# Patient Record
Sex: Male | Born: 1985
Health system: Southern US, Community
[De-identification: ages and names within clinical notes are randomized; demographics above are authoritative.]

## PROBLEM LIST (undated history)

## (undated) DIAGNOSIS — J36 Peritonsillar abscess: Secondary | ICD-10-CM

## (undated) DIAGNOSIS — E119 Type 2 diabetes mellitus without complications: Secondary | ICD-10-CM

## (undated) DIAGNOSIS — N182 Chronic kidney disease, stage 2 (mild): Secondary | ICD-10-CM

## (undated) DIAGNOSIS — E785 Hyperlipidemia, unspecified: Secondary | ICD-10-CM

## (undated) DIAGNOSIS — E669 Obesity, unspecified: Secondary | ICD-10-CM

## (undated) DIAGNOSIS — I10 Essential (primary) hypertension: Secondary | ICD-10-CM

## (undated) DIAGNOSIS — F419 Anxiety disorder, unspecified: Secondary | ICD-10-CM

## (undated) HISTORY — DX: Hyperlipidemia, unspecified: E78.5

## (undated) HISTORY — DX: Chronic kidney disease, stage 2 (mild): N18.2

## (undated) HISTORY — PX: CHOLECYSTECTOMY: SHX55

## (undated) HISTORY — PX: INCISION AND DRAINAGE OF PERITONSILLAR ABCESS: SHX6257

---
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
## 2011-11-18 ENCOUNTER — Emergency Department (HOSPITAL_COMMUNITY)
Admission: EM | Admit: 2011-11-18 | Discharge: 2011-11-18 | Disposition: A | Discharge status: Home / Self Care | Payer: Self-pay | Attending: Emergency Medicine | Admitting: Emergency Medicine

## 2011-11-18 ENCOUNTER — Encounter (HOSPITAL_COMMUNITY): Payer: Self-pay | Admitting: Emergency Medicine

## 2011-11-18 DIAGNOSIS — K029 Dental caries, unspecified: Secondary | ICD-10-CM | POA: Insufficient documentation

## 2011-11-18 DIAGNOSIS — K047 Periapical abscess without sinus: Secondary | ICD-10-CM | POA: Insufficient documentation

## 2011-11-18 MED ORDER — PENICILLIN V POTASSIUM 500 MG PO TABS: 500.0000 mg | ORAL_TABLET | Freq: Three times a day (TID) | ORAL | Status: AC

## 2011-11-18 MED ORDER — TRAMADOL HCL 50 MG PO TABS
50.0000 mg | ORAL_TABLET | Freq: Once | ORAL | Status: AC
  Administered 2011-11-18: 50 mg via ORAL
  Filled 2011-11-18: qty 1

## 2011-11-18 MED ORDER — PENICILLIN V POTASSIUM 500 MG PO TABS
500.0000 mg | ORAL_TABLET | Freq: Four times a day (QID) | ORAL | Status: DC
  Administered 2011-11-18: 500 mg via ORAL
  Filled 2011-11-18: qty 1

## 2011-11-18 MED ORDER — TRAMADOL HCL 50 MG PO TABS: 50.0000 mg | ORAL_TABLET | Freq: Once | ORAL | Status: AC

## 2011-11-18 NOTE — ED Provider Notes (Signed)
History     CSN: [id number]  Arrival date & time 11/18/11  0129   None     Chief Complaint  Patient presents with  . Dental Pain    (Consider location/radiation/quality/duration/timing/severity/associated sxs/prior treatment) HPI Comments: Patient with upper and lower R tooth pain intermittently for he past 3 days worse tonight  Patient is a 26 y.o. male presenting with tooth pain. The history is provided by the patient.  Dental PainThe primary symptoms include mouth pain. Primary symptoms do not include headaches or fever. The symptoms began 3 to 5 days ago. The symptoms are worsening. The symptoms occur constantly.  Additional symptoms do not include: ear pain.    History reviewed. No pertinent past medical history.  History reviewed. No pertinent past surgical history.  History reviewed. No pertinent family history.  History  Substance Use Topics  . Smoking status: Never Smoker   . Smokeless tobacco: Not on file   Comment: quit in January  . Alcohol Use: No      Review of Systems  Constitutional: Negative for fever.  HENT: Negative for ear pain.   Neurological: Negative for dizziness and headaches.    Allergies  Review of patient's allergies indicates no known allergies.  Home Medications   Current Outpatient Rx  Name Route Sig Dispense Refill  . PENICILLIN V POTASSIUM 500 MG PO TABS Oral Take 1 tablet (500 mg total) by mouth 3 (three) times daily. 30 tablet 0  . TRAMADOL HCL 50 MG PO TABS Oral Take 1 tablet (50 mg total) by mouth once. 30 tablet 0    BP 164/106  Pulse 76  Temp 98.7 F (37.1 C) (Oral)  Resp 18  SpO2 100%  Physical Exam  Constitutional: He appears well-developed and well-nourished.  HENT:  Head: Normocephalic.  Mouth/Throat:    Eyes: Pupils are equal, round, and reactive to light.  Neck: Normal range of motion.  Cardiovascular: Normal rate.   Pulmonary/Chest: Effort normal.  Musculoskeletal: Normal range of motion.    Lymphadenopathy:    He has no cervical adenopathy.  Neurological: He is alert.  Skin: Skin is warm.    ED Course  Procedures (including critical care time)  Labs Reviewed - No data to display No results found.   1. Dental cavity   2. Dental abscess       MDM   Large cavity with dental abscess        Arman Filter, NP 11/18/11 0303  Arman Filter, NP 11/18/11 4098  Arman Filter, NP 11/18/11 1191  Arman Filter, NP 11/18/11 [phone]

## 2011-11-18 NOTE — ED Notes (Signed)
Patient states that he has had pain to his right side of his mouth upper and lower for 3 days.

## 2011-11-20 NOTE — ED Provider Notes (Signed)
Medical screening examination/treatment/procedure(s) were performed by non-physician practitioner and as supervising physician I was immediately available for consultation/collaboration.    Jathan Balling D Marketta Valadez, MD 11/20/11 2251 

## 2012-12-29 ENCOUNTER — Encounter (HOSPITAL_COMMUNITY): Payer: Self-pay | Admitting: Emergency Medicine

## 2012-12-29 ENCOUNTER — Emergency Department (HOSPITAL_COMMUNITY): Payer: Self-pay

## 2012-12-29 ENCOUNTER — Emergency Department (HOSPITAL_COMMUNITY)
Admission: EM | Admit: 2012-12-29 | Discharge: 2012-12-29 | Disposition: A | Discharge status: Home / Self Care | Payer: Self-pay | Attending: Emergency Medicine | Admitting: Emergency Medicine

## 2012-12-29 DIAGNOSIS — H9209 Otalgia, unspecified ear: Secondary | ICD-10-CM | POA: Insufficient documentation

## 2012-12-29 DIAGNOSIS — R498 Other voice and resonance disorders: Secondary | ICD-10-CM | POA: Insufficient documentation

## 2012-12-29 DIAGNOSIS — R51 Headache: Secondary | ICD-10-CM | POA: Insufficient documentation

## 2012-12-29 DIAGNOSIS — J36 Peritonsillar abscess: Secondary | ICD-10-CM | POA: Insufficient documentation

## 2012-12-29 DIAGNOSIS — R1319 Other dysphagia: Secondary | ICD-10-CM | POA: Insufficient documentation

## 2012-12-29 DIAGNOSIS — J3489 Other specified disorders of nose and nasal sinuses: Secondary | ICD-10-CM | POA: Insufficient documentation

## 2012-12-29 DIAGNOSIS — R42 Dizziness and giddiness: Secondary | ICD-10-CM | POA: Insufficient documentation

## 2012-12-29 LAB — CBC WITH DIFFERENTIAL/PLATELET
Basophils Absolute: 0 10*3/uL (ref 0.0–0.1)
Eosinophils Relative: 0 % (ref 0–5)
Lymphocytes Relative: 26 % (ref 12–46)
Lymphs Abs: 3.7 10*3/uL (ref 0.7–4.0)
MCV: 70.7 fL — ABNORMAL LOW (ref 78.0–100.0)
Monocytes Relative: 10 % (ref 3–12)
Neutrophils Relative %: 64 % (ref 43–77)
Platelets: 287 10*3/uL (ref 150–400)
RBC: 5.87 MIL/uL — ABNORMAL HIGH (ref 4.22–5.81)
RDW: 14.2 % (ref 11.5–15.5)
WBC: 14.1 10*3/uL — ABNORMAL HIGH (ref 4.0–10.5)

## 2012-12-29 LAB — MONONUCLEOSIS SCREEN: Mono Screen: NEGATIVE

## 2012-12-29 LAB — BASIC METABOLIC PANEL
Chloride: 100 mEq/L (ref 96–112)
Creatinine, Ser: 1.33 mg/dL (ref 0.50–1.35)
GFR calc Af Amer: 82 mL/min — ABNORMAL LOW (ref >90)
Sodium: 138 mEq/L (ref 135–145)

## 2012-12-29 MED ORDER — SODIUM CHLORIDE 0.9 % IV BOLUS (SEPSIS)
1000.0000 mL | Freq: Once | INTRAVENOUS | Status: AC
  Administered 2012-12-29: 1000 mL via INTRAVENOUS

## 2012-12-29 MED ORDER — CLINDAMYCIN HCL 300 MG PO CAPS: 300.0000 mg | ORAL_CAPSULE | Freq: Four times a day (QID) | ORAL | Status: DC

## 2012-12-29 MED ORDER — ONDANSETRON HCL 4 MG/2ML IJ SOLN
4.0000 mg | Freq: Once | INTRAMUSCULAR | Status: AC
  Administered 2012-12-29: 4 mg via INTRAVENOUS
  Filled 2012-12-29: qty 2

## 2012-12-29 MED ORDER — CLINDAMYCIN PHOSPHATE 600 MG/50ML IV SOLN
600.0000 mg | Freq: Once | INTRAVENOUS | Status: AC
  Administered 2012-12-29: 600 mg via INTRAVENOUS
  Filled 2012-12-29: qty 50

## 2012-12-29 MED ORDER — IOHEXOL 300 MG/ML  SOLN
100.0000 mL | Freq: Once | INTRAMUSCULAR | Status: AC | PRN
  Administered 2012-12-29: 100 mL via INTRAVENOUS

## 2012-12-29 MED ORDER — MORPHINE SULFATE 4 MG/ML IJ SOLN
4.0000 mg | Freq: Once | INTRAMUSCULAR | Status: AC
  Administered 2012-12-29: 4 mg via INTRAVENOUS
  Filled 2012-12-29: qty 1

## 2012-12-29 MED ORDER — DEXAMETHASONE SODIUM PHOSPHATE 10 MG/ML IJ SOLN
10.0000 mg | Freq: Once | INTRAMUSCULAR | Status: AC
  Administered 2012-12-29: 10 mg via INTRAVENOUS
  Filled 2012-12-29: qty 1

## 2012-12-29 NOTE — ED Notes (Signed)
PA at bedside.

## 2012-12-29 NOTE — ED Provider Notes (Signed)
CSN: [id number]     Arrival date & time 12/29/12  1619 History     First MD Initiated Contact with Patient 12/29/12 1638     Chief Complaint  Patient presents with  . Sore Throat  . Dizziness  . Headache   (Consider location/radiation/quality/duration/timing/severity/associated sxs/prior Treatment) HPI Comments: Patient is a 27 year old male who presents today with trismus and "swollen neck". This has been gradually worsening over the past 3 days. He describes the pain as a pressure. He is taking Mucinex to attempt to relieve his symptoms with no relief. He is unsure if he has had a fever because he has not measured it. He denies nausea, vomiting, abdominal pain. He admits to right ear pain. He has a mild amount of congestion.  The history is provided by the patient. No language interpreter was used.    No past medical history on file. No past surgical history on file. No family history on file. History  Substance Use Topics  . Smoking status: Never Smoker   . Smokeless tobacco: Not on file     Comment: quit in January  . Alcohol Use: No    Review of Systems  Constitutional: Negative for fever and chills.  HENT: Positive for ear pain, sore throat, trouble swallowing and voice change.   All other systems reviewed and are negative.    Allergies  Review of patient's allergies indicates no known allergies.  Home Medications  No current outpatient prescriptions on file. BP 146/96  Pulse 106  Temp(Src) 99.7 F (37.6 C) (Oral)  Resp 20  SpO2 97% Physical Exam  Nursing note and vitals reviewed. Constitutional: He is oriented to person, place, and time. He appears well-developed and well-nourished. No distress.  HENT:  Head: Normocephalic and atraumatic.  Right Ear: External ear normal.  Left Ear: External ear normal.  Nose: Nose normal.  Mouth/Throat: There is trismus in the jaw.  Muffled voice. Significant swelling of tonsils right worse than left. No submental edema.  Currently maintaining airway and own secretions.   Eyes: Conjunctivae are normal.  Neck: Normal range of motion. No tracheal deviation present.  Cardiovascular: Normal rate, regular rhythm and normal heart sounds.   Pulmonary/Chest: Effort normal and breath sounds normal. No stridor.  Abdominal: Soft. He exhibits no distension. There is no tenderness.  Musculoskeletal: Normal range of motion.  Neurological: He is alert and oriented to person, place, and time.  Skin: Skin is warm and dry. He is not diaphoretic.  Psychiatric: He has a normal mood and affect. His behavior is normal.    ED Course   Procedures (including critical care time)   Date: 12/29/2012  Rate: 110  Rhythm: sinus tachycardia  QRS Axis: normal  Intervals: normal  ST/T Wave abnormalities: normal  Conduction Disutrbances:none  Narrative Interpretation: high voltage  Old EKG Reviewed: none available    Labs Reviewed  CBC WITH DIFFERENTIAL - Abnormal; Notable for the following:    WBC 14.1 (*)    RBC 5.87 (*)    MCV 70.7 (*)    MCH 24.0 (*)    Neutro Abs 9.0 (*)    Monocytes Absolute 1.4 (*)    All other components within normal limits  BASIC METABOLIC PANEL - Abnormal; Notable for the following:    Potassium 3.2 (*)    GFR calc non Af Amer 71 (*)    GFR calc Af Amer 82 (*)    All other components within normal limits  CULTURE, BLOOD (ROUTINE X 2)  CULTURE, BLOOD (ROUTINE X 2)  MONONUCLEOSIS SCREEN  HETEROPHILE AB, REFLEX TO TITER, BLOOD   6:33 PM Discussed case with Dr. Jenne Pane who will come in to drain the abscess  Ct Soft Tissue Neck W Contrast  12/29/2012   *RADIOLOGY REPORT*  Clinical Data: Sore throat and throat swelling.  CT NECK WITH CONTRAST  Technique:  Multidetector CT imaging of the neck was performed with intravenous contrast.  Contrast: OMNIPAQUE IOHEXOL 300 MG/ML  SOLN  Comparison: None.  Findings: The patient has a large right peritonsillar abscess measuring 3.5 x 3.3 x 3.3 cm.  This  is a marked mass effect upon the airway.  The left lingual tonsil is also enlarged and there are prominent secretions above the epiglottis.  The tongue base and epiglottis appear normal.  There is no prevertebral soft tissue swelling.  There is reactive adenopathy in both sides of the neck. The adenoids are slightly prominent.  This submandibular glands and parotid glands are normal.  Paranasal sinuses and middle ear cavities and mastoid air cells are clear.  IMPRESSION: Large right peritonsillar abscess.  Critical Value/emergent results were called by telephone at the time of interpretation on 12/29/2012 at 6:30 p.m. to Junious Silk, PA, who verbally acknowledged these results.   Original Report Authenticated By: Francene Boyers, M.D.   1. Peritonsillar abscess     MDM  Patient with peritonsillar abscess which was drained in ED by Dr. Jenne Pane. He recommends PO clindamycin QID and follow up with him in 1 week. I reevaluated the patient prior to discharge. He feels significantly improved. His voice is no longer muffled and he does not have trismus. Will d/c with clinda script. Strict return instructions given. Vital signs stable for discharge. Dr. Elesa Massed evaluated this patient and agrees with plan. Patient / Family / Caregiver informed of clinical course, understand medical decision-making process, and agree with plan.     Medications  sodium chloride 0.9 % bolus 1,000 mL (0 mLs Intravenous Stopped 12/29/12 1956)  dexamethasone (DECADRON) injection 10 mg (10 mg Intravenous Given 12/29/12 1720)  ondansetron (ZOFRAN) injection 4 mg (4 mg Intravenous Given 12/29/12 1720)  morphine 4 MG/ML injection 4 mg (4 mg Intravenous Given 12/29/12 1721)  clindamycin (CLEOCIN) IVPB 600 mg (0 mg Intravenous Stopped 12/29/12 1956)  iohexol (OMNIPAQUE) 300 MG/ML solution 100 mL (100 mLs Intravenous Contrast Given 12/29/12 1810)     Mora Bellman, PA-C 12/29/12 2010

## 2012-12-29 NOTE — Procedures (Signed)
Preop diagnosis: Right peritonsillar abscess Postop diagnosis: same Procedure: I&D of right peritonsillar abscess Surgeon: Jenne Pane Anesth: Local with 2% lidocaine Comp: None Findings: Collection of malodorous yellow pus encountered and drained. Description: After discussing risks, benefits, and alternatives, the right oropharynx was sprayed with cetacaine twice and then injected with 2% lidocaine.  A horizontal incision was made superior to the tonsil and the abscess cavity was entered causing drainage of malodorous, yellow pus.  The wound was explored several times with a curved tonsil clamp allowing full drainage.  The patient was then returned to nursing care and allowed to rinse his mouth out with water.

## 2012-12-29 NOTE — ED Provider Notes (Signed)
Medical screening examination/treatment/procedure(s) were performed by non-physician practitioner and as supervising physician I was immediately available for consultation/collaboration.  Oreoluwa Aigner N Synethia Endicott, DO 12/29/12 2340 

## 2012-12-29 NOTE — Consult Note (Signed)
Reason for Consult:peritonsillar abscess Referring Physician: ER  Derek Reed is an 27 y.o. male.  HPI: 27 year old male with three days of worsening right-sided sore throat and swelling with associated difficulty swallowing.  No airway distress.  Nothing relieves pain.  His voice has become muffled and he has difficulty opening his mouth.  History reviewed. No pertinent past medical history.  History reviewed. No pertinent past surgical history.  History reviewed. No pertinent family history.  Social History:  reports that he has never smoked. He does not have any smokeless tobacco history on file. He reports that he does not drink alcohol or use illicit drugs.  Allergies: No Known Allergies  Medications: I have reviewed the patient's current medications.  Results for orders placed during the hospital encounter of 12/29/12 (from the past 48 hour(s))  CBC WITH DIFFERENTIAL     Status: Abnormal   Collection Time    12/29/12  5:25 PM      Result Value Range   WBC 14.1 (*) 4.0 - 10.5 K/uL   RBC 5.87 (*) 4.22 - 5.81 MIL/uL   Hemoglobin 14.1  13.0 - 17.0 g/dL   HCT 16.1  09.6 - 04.5 %   MCV 70.7 (*) 78.0 - 100.0 fL   MCH 24.0 (*) 26.0 - 34.0 pg   MCHC 34.0  30.0 - 36.0 g/dL   RDW 40.9  81.1 - 91.4 %   Platelets 287  150 - 400 K/uL   Neutrophils Relative % 64  43 - 77 %   Lymphocytes Relative 26  12 - 46 %   Monocytes Relative 10  3 - 12 %   Eosinophils Relative 0  0 - 5 %   Basophils Relative 0  0 - 1 %   Neutro Abs 9.0 (*) 1.7 - 7.7 K/uL   Lymphs Abs 3.7  0.7 - 4.0 K/uL   Monocytes Absolute 1.4 (*) 0.1 - 1.0 K/uL   Eosinophils Absolute 0.0  0.0 - 0.7 K/uL   Basophils Absolute 0.0  0.0 - 0.1 K/uL   Smear Review LARGE PLATELETS PRESENT     Comment: PLATELET COUNT CONFIRMED BY SMEAR  BASIC METABOLIC PANEL     Status: Abnormal   Collection Time    12/29/12  5:25 PM      Result Value Range   Sodium 138  135 - 145 mEq/L   Potassium 3.2 (*) 3.5 - 5.1 mEq/L   Chloride 100  96  - 112 mEq/L   CO2 27  19 - 32 mEq/L   Glucose, Bld 95  70 - 99 mg/dL   BUN 9  6 - 23 mg/dL   Creatinine, Ser 7.82  0.50 - 1.35 mg/dL   Calcium 9.7  8.4 - 95.6 mg/dL   GFR calc non Af Amer 71 (*) >90 mL/min   GFR calc Af Amer 82 (*) >90 mL/min   Comment: (NOTE)     The eGFR has been calculated using the CKD EPI equation.     This calculation has not been validated in all clinical situations.     eGFR's persistently <90 mL/min signify possible Chronic Kidney     Disease.  MONONUCLEOSIS SCREEN     Status: None   Collection Time    12/29/12  6:12 PM      Result Value Range   Mono Screen NEGATIVE  NEGATIVE    Ct Soft Tissue Neck W Contrast  12/29/2012   *RADIOLOGY REPORT*  Clinical Data: Sore throat and throat  swelling.  CT NECK WITH CONTRAST  Technique:  Multidetector CT imaging of the neck was performed with intravenous contrast.  Contrast: OMNIPAQUE IOHEXOL 300 MG/ML  SOLN  Comparison: None.  Findings: The patient has a large right peritonsillar abscess measuring 3.5 x 3.3 x 3.3 cm.  This is a marked mass effect upon the airway.  The left lingual tonsil is also enlarged and there are prominent secretions above the epiglottis.  The tongue base and epiglottis appear normal.  There is no prevertebral soft tissue swelling.  There is reactive adenopathy in both sides of the neck. The adenoids are slightly prominent.  This submandibular glands and parotid glands are normal.  Paranasal sinuses and middle ear cavities and mastoid air cells are clear.  IMPRESSION: Large right peritonsillar abscess.  Critical Value/emergent results were called by telephone at the time of interpretation on 12/29/2012 at 6:30 p.m. to Junious Silk, PA, who verbally acknowledged these results.   Original Report Authenticated By: Francene Boyers, M.D.    Review of Systems  Constitutional: Positive for fever.  HENT: Positive for sore throat.   All other systems reviewed and are negative.   Blood pressure 146/96,  pulse 106, temperature 99.7 F (37.6 C), temperature source Oral, resp. rate 20, SpO2 97.00%. Physical Exam  Constitutional: He is oriented to person, place, and time. He appears well-developed and well-nourished. No distress.  HENT:  Head: Normocephalic and atraumatic.  Right Ear: External ear normal.  Left Ear: External ear normal.  Nose: Nose normal.  Mouth/Throat: Oropharynx is clear and moist.  Significant trismus.  Muffled voice.  Right prominent peritonsillar fullness.  Eyes: Conjunctivae and EOM are normal. Pupils are equal, round, and reactive to light.  Neck: Normal range of motion. Neck supple.  Right zone 2 tenderness.  Cardiovascular: Normal rate.   Respiratory: Effort normal.  GI:  Did not examine.  Genitourinary:  Did not examine.  Musculoskeletal: Normal range of motion.  Neurological: He is alert and oriented to person, place, and time. No cranial nerve deficit.  Skin: Skin is warm and dry.  Psychiatric: He has a normal mood and affect. His behavior is normal. Judgment and thought content normal.    Assessment/Plan: Right peritonsillar abscess Elevated white blood count and CT findings noted.  I recommended and performed I&D of right peritonsillar abscess at the bedside.  See procedure note.  He is stable for discharge on 10 day course of oral clindamycin.  Follow-up in one week.  Parish Augustine 12/29/2012, 7:28 PM

## 2012-12-29 NOTE — ED Provider Notes (Signed)
Medical screening examination/treatment/procedure(s) were conducted as a shared visit with non-physician practitioner(s) and myself.  I personally evaluated the patient during the encounter and I agree with physical exam and plan of care.  She is a 27 year old male who presents emergency department with sore throat, headache, fever for the past 3 days. On exam, patient is tachycardic but afebrile. Normotensive. He is nontoxic appearing. He has no meningismus but does have pain with rotation of his neck. Patient has significant tonsillar hypertrophy with  exudate, muffled voice, trismus. No drooling. Patient has anterior cervical lymphadenopathy. Patient is protecting his airway. No difficulty breathing. Will obtain labs, Monospot, strep test, CT soft tissue neck given his pain with neck motion to evaluate for possible deep space infection/peritonsillar abscess. We'll give IV fluids, pain medication, clindamycin, Decadron.  Pt has large right peritonsillar abscess. ENT has been consulted for incision and drainage. Patient is hemodynamically stable, airway patent.  Layla Maw Ward, DO 12/29/12 2347

## 2012-12-29 NOTE — ED Notes (Addendum)
Patient reports to ED for sore and swollen throat, ear ache, headache, dizziness, and spitting up mucoid dark black secretions. Patient denies neck stiffness. Denies n/v/d, stomach pain. Denies recent travel.

## 2013-01-01 LAB — HETEROPHILE AB, REFLEX TO TITER, BLOOD: Heterophile Ab Screen: NEGATIVE

## 2013-01-05 LAB — CULTURE, BLOOD (ROUTINE X 2): Culture: NO GROWTH

## 2013-08-07 ENCOUNTER — Emergency Department (HOSPITAL_BASED_OUTPATIENT_CLINIC_OR_DEPARTMENT_OTHER): Payer: Self-pay

## 2013-08-07 ENCOUNTER — Emergency Department (HOSPITAL_BASED_OUTPATIENT_CLINIC_OR_DEPARTMENT_OTHER)
Admission: EM | Admit: 2013-08-07 | Discharge: 2013-08-08 | Disposition: A | Discharge status: Home / Self Care | Payer: Self-pay | Attending: Emergency Medicine | Admitting: Emergency Medicine

## 2013-08-07 ENCOUNTER — Encounter (HOSPITAL_BASED_OUTPATIENT_CLINIC_OR_DEPARTMENT_OTHER): Payer: Self-pay | Admitting: Emergency Medicine

## 2013-08-07 DIAGNOSIS — R599 Enlarged lymph nodes, unspecified: Secondary | ICD-10-CM | POA: Insufficient documentation

## 2013-08-07 DIAGNOSIS — Z792 Long term (current) use of antibiotics: Secondary | ICD-10-CM | POA: Insufficient documentation

## 2013-08-07 DIAGNOSIS — R590 Localized enlarged lymph nodes: Secondary | ICD-10-CM

## 2013-08-07 DIAGNOSIS — Z79899 Other long term (current) drug therapy: Secondary | ICD-10-CM | POA: Insufficient documentation

## 2013-08-07 LAB — BASIC METABOLIC PANEL
BUN: 9 mg/dL (ref 6–23)
CO2: 25 meq/L (ref 19–32)
Calcium: 9.1 mg/dL (ref 8.4–10.5)
Chloride: 101 mEq/L (ref 96–112)
Creatinine, Ser: 1.2 mg/dL (ref 0.50–1.35)
GFR calc Af Amer: >90 mL/min (ref >90)
GFR, EST NON AFRICAN AMERICAN: 80 mL/min — AB (ref >90)
GLUCOSE: 123 mg/dL — AB (ref 70–99)
POTASSIUM: 3.5 meq/L — AB (ref 3.7–5.3)
Sodium: 141 mEq/L (ref 137–147)

## 2013-08-07 MED ORDER — IOHEXOL 300 MG/ML  SOLN
75.0000 mL | Freq: Once | INTRAMUSCULAR | Status: AC | PRN
  Administered 2013-08-07: 75 mL via INTRAVENOUS

## 2013-08-07 NOTE — ED Provider Notes (Signed)
CSN: [id number]     Arrival date & time 08/07/13  1827 History  This chart was scribed for Derek OctaveStephen Momo Braun, MD by Danella Maiersaroline Early, ED Scribe. This patient was seen in room MH05/[facility]05 and the patient's care was started at 9:07 PM.    Chief Complaint  Patient presents with  . Abscess   The history is provided by the patient. No language interpreter was used.   HPI Comments: Marcy SalvoDarryl Groleau is a 28 y.o. male who presents to the Emergency Department complaining of an abscess behind the right ear onset one month ago with associated intermittent pain and yellow-green drainage. He states the last time it drained was yesterday. He denies fevers, vomiting, trouble swallowing, SOB, CP, dental pain. He is otherwise healthy. He does not have a PCP.   History reviewed. No pertinent past medical history. History reviewed. No pertinent past surgical history. No family history on file. History  Substance Use Topics  . Smoking status: Never Smoker   . Smokeless tobacco: Not on file     Comment: quit in January  . Alcohol Use: No    Review of Systems  Constitutional: Negative for fever.  HENT: Negative for dental problem and trouble swallowing.   Respiratory: Negative for shortness of breath.   Cardiovascular: Negative for chest pain.  Gastrointestinal: Negative for vomiting.   A complete 10 system review of systems was obtained and all systems are negative except as noted in the HPI and PMH.     Allergies  Review of patient's allergies indicates no known allergies.  Home Medications   Current Outpatient Rx  Name  Route  Sig  Dispense  Refill  . amoxicillin (AMOXIL) 500 MG capsule   Oral   Take 1 capsule (500 mg total) by mouth 3 (three) times daily.   21 capsule   0   . clindamycin (CLEOCIN) 300 MG capsule   Oral   Take 1 capsule (300 mg total) by mouth 4 (four) times daily. X 7 days   40 capsule   0   . guaiFENesin (MUCINEX) 600 MG 12 hr tablet   Oral   Take 1,200 mg by mouth 2  (two) times daily.         . Pseudoeph-Doxylamine-DM-APAP (NYQUIL PO)   Oral   Take 15 mLs by mouth every 6 (six) hours as needed (cold symptoms).          BP 128/100  Pulse 82  Temp(Src) 99.2 F (37.3 C) (Oral)  Resp 18  Ht 5\' 6"  (1.676 m)  Wt 240 lb (108.863 kg)  BMI 38.76 kg/m2  SpO2 100% Physical Exam  Nursing note and vitals reviewed. Constitutional: He is oriented to person, place, and time. He appears well-developed and well-nourished. No distress.  HENT:  Head: Normocephalic and atraumatic.  Right side - induration and edema with small palpable nodules inferior to right ear lobe. No mastoid tenderness no tragus tenderness. Left side- induration and questionable edema inferior to the left ear lobe.  No overlying skin change. No erythema. No cervical lymphadenopathy. No dental problems. Floor of the mouth soft, no abscess, no dental pain.  Eyes: EOM are normal.  Neck: Neck supple. No tracheal deviation present.  No meningismus  Cardiovascular: Normal rate and regular rhythm.   Pulmonary/Chest: Effort normal and breath sounds normal. No respiratory distress.  Musculoskeletal: Normal range of motion.  Neurological: He is alert and oriented to person, place, and time.  Skin: Skin is warm and dry.  Psychiatric: He has  a normal mood and affect. His behavior is normal.    ED Course  Procedures (including critical care time) Medications  iohexol (OMNIPAQUE) 300 MG/ML solution 75 mL (75 mLs Intravenous Contrast Given 08/07/13 2348)    DIAGNOSTIC STUDIES: Oxygen Saturation is 100% on RA, normal by my interpretation.    COORDINATION OF CARE: 9:13 PM- Discussed treatment plan with pt. Pt agrees to plan.    Labs Review Labs Reviewed  BASIC METABOLIC PANEL - Abnormal; Notable for the following:    Potassium 3.5 (*)    Glucose, Bld 123 (*)    GFR calc non Af Amer 80 (*)    All other components within normal limits   Imaging Review Ct Soft Tissue Neck W  Contrast  08/08/2013   CLINICAL DATA:  The fullness and induration underneath ear lobe.  EXAM: CT NECK WITH CONTRAST  TECHNIQUE: Multidetector CT imaging of the neck was performed using the standard protocol following the bolus administration of intravenous contrast.  CONTRAST:  1mL OMNIPAQUE IOHEXOL 300 MG/ML  SOLN  COMPARISON:  12/29/2012  FINDINGS: Fullness around the right auricle correlates with mildly enlarged lymph nodes within the superficial parotid. These nodes have enlarged from 2014. There is no suppurative changes or infiltrative margins. No primary source of inflammation identified. There is cerumen in the right external canal which is nearly obstructive. No mastoiditis or middle ear opacification. No inflammation of the parotid.  Enlargement of tonsillar tissue, especially the palatine tonsils. No evidence of abscess.  Mild, symmetric cervical chain adenopathy, likely related to above.  Unremarkable salivary glands (other than above) and thyroid gland.  Clear apical lungs.  Patent cervical vessels.  IMPRESSION: 1. Right periauricular fullness correlates with mild parotid adenopathy, usually reactive to scalp or ear inflammation. No abscess. 2. Tonsillar enlargement, usually from previous or active tonsillitis.   Electronically Signed   By: Tiburcio Pea M.D.   On: 08/08/2013 00:29     EKG Interpretation None      MDM   Final diagnoses:  Posterior auricular lymphadenopathy  Tender, indurated area inferior and posterior to R ear x 84month.  No erythema or fluctuance.  Area of induration inferior to L ear, less prominent.  No CP or SOB.  No difficulty breathing or swallowing, no fever.  Imaging shows no evidence of abscess.  Adenopathy seen without primary source of inflammation identified.  Will start amoxicillin, warm compresses, followup with ENT.  Return precaution discussed.  I personally performed the services described in this documentation, which was scribed in my presence.  The recorded information has been reviewed and is accurate.  Derek Octave, MD 08/08/13 [id number]

## 2013-08-07 NOTE — ED Notes (Signed)
Abscess behind his right ear for a month. Drainage at times.

## 2013-08-08 MED ORDER — AMOXICILLIN 500 MG PO CAPS: 500.0000 mg | ORAL_CAPSULE | Freq: Three times a day (TID) | ORAL | Status: DC

## 2013-08-08 NOTE — Discharge Instructions (Signed)
Lymphadenopathy Use antibiotics as prescribed. use hot compresses on the affected area. Followup with the ENT doctors. Return to the ED if you develop new or worsening symptoms. Lymphadenopathy means "disease of the lymph glands." But the term is usually used to describe swollen or enlarged lymph glands, also called lymph nodes. These are the bean-shaped organs found in many locations including the neck, underarm, and groin. Lymph glands are part of the immune system, which fights infections in your body. Lymphadenopathy can occur in just one area of the body, such as the neck, or it can be generalized, with lymph node enlargement in several areas. The nodes found in the neck are the most common sites of lymphadenopathy. CAUSES  When your immune system responds to germs (such as viruses or bacteria ), infection-fighting cells and fluid build up. This causes the glands to grow in size. This is usually not something to worry about. Sometimes, the glands themselves can become infected and inflamed. This is called lymphadenitis. Enlarged lymph nodes can be caused by many diseases:  Bacterial disease, such as strep throat or a skin infection.  Viral disease, such as a common cold.  Other germs, such as lyme disease, tuberculosis, or sexually transmitted diseases.  Cancers, such as lymphoma (cancer of the lymphatic system) or leukemia (cancer of the white blood cells).  Inflammatory diseases such as lupus or rheumatoid arthritis.  Reactions to medications. Many of the diseases above are rare, but important. This is why you should see your caregiver if you have lymphadenopathy. SYMPTOMS   Swollen, enlarged lumps in the neck, back of the head or other locations.  Tenderness.  Warmth or redness of the skin over the lymph nodes.  Fever. DIAGNOSIS  Enlarged lymph nodes are often near the source of infection. They can help healthcare providers diagnose your illness. For instance:   Swollen lymph  nodes around the jaw might be caused by an infection in the mouth.  Enlarged glands in the neck often signal a throat infection.  Lymph nodes that are swollen in more than one area often indicate an illness caused by a virus. Your caregiver most likely will know what is causing your lymphadenopathy after listening to your history and examining you. Blood tests, x-rays or other tests may be needed. If the cause of the enlarged lymph node cannot be found, and it does not go away by itself, then a biopsy may be needed. Your caregiver will discuss this with you. TREATMENT  Treatment for your enlarged lymph nodes will depend on the cause. Many times the nodes will shrink to normal size by themselves, with no treatment. Antibiotics or other medicines may be needed for infection. Only take over-the-counter or prescription medicines for pain, discomfort or fever as directed by your caregiver. HOME CARE INSTRUCTIONS  Swollen lymph glands usually return to normal when the underlying medical condition goes away. If they persist, contact your health-care provider. He/she might prescribe antibiotics or other treatments, depending on the diagnosis. Take any medications exactly as prescribed. Keep any follow-up appointments made to check on the condition of your enlarged nodes.  SEEK MEDICAL CARE IF:   Swelling lasts for more than two weeks.  You have symptoms such as weight loss, night sweats, fatigue or fever that does not go away.  The lymph nodes are hard, seem fixed to the skin or are growing rapidly.  Skin over the lymph nodes is red and inflamed. This could mean there is an infection. SEEK IMMEDIATE MEDICAL CARE IF:  Fluid starts leaking from the area of the enlarged lymph node.  You develop a fever of 102 F (38.9 C) or greater.  Severe pain develops (not necessarily at the site of a large lymph node).  You develop chest pain or shortness of breath.  You develop worsening abdominal  pain. MAKE SURE YOU:   Understand these instructions.  Will watch your condition.  Will get help right away if you are not doing well or get worse. Document Released: 02/04/2008 Document Revised: 07/20/2011 Document Reviewed: 02/04/2008 [facility] Patient Information 2014 [place], [place].

## 2013-12-08 ENCOUNTER — Encounter (HOSPITAL_BASED_OUTPATIENT_CLINIC_OR_DEPARTMENT_OTHER): Payer: Self-pay | Admitting: Emergency Medicine

## 2013-12-08 ENCOUNTER — Emergency Department (HOSPITAL_BASED_OUTPATIENT_CLINIC_OR_DEPARTMENT_OTHER): Payer: Self-pay

## 2013-12-08 ENCOUNTER — Emergency Department (HOSPITAL_BASED_OUTPATIENT_CLINIC_OR_DEPARTMENT_OTHER)
Admission: EM | Admit: 2013-12-08 | Discharge: 2013-12-08 | Disposition: A | Discharge status: Home / Self Care | Payer: Self-pay | Attending: Emergency Medicine | Admitting: Emergency Medicine

## 2013-12-08 DIAGNOSIS — Z791 Long term (current) use of non-steroidal anti-inflammatories (NSAID): Secondary | ICD-10-CM | POA: Insufficient documentation

## 2013-12-08 DIAGNOSIS — R0789 Other chest pain: Secondary | ICD-10-CM | POA: Insufficient documentation

## 2013-12-08 DIAGNOSIS — R0602 Shortness of breath: Secondary | ICD-10-CM | POA: Insufficient documentation

## 2013-12-08 DIAGNOSIS — R079 Chest pain, unspecified: Secondary | ICD-10-CM | POA: Insufficient documentation

## 2013-12-08 DIAGNOSIS — Z79899 Other long term (current) drug therapy: Secondary | ICD-10-CM | POA: Insufficient documentation

## 2013-12-08 LAB — CBC WITH DIFFERENTIAL/PLATELET
BASOS PCT: 0 % (ref 0–1)
Basophils Absolute: 0 10*3/uL (ref 0.0–0.1)
EOS PCT: 7 % — AB (ref 0–5)
Eosinophils Absolute: 0.5 10*3/uL (ref 0.0–0.7)
HEMATOCRIT: 37.3 % — AB (ref 39.0–52.0)
Hemoglobin: 12.3 g/dL — ABNORMAL LOW (ref 13.0–17.0)
LYMPHS PCT: 37 % (ref 12–46)
Lymphs Abs: 2.7 10*3/uL (ref 0.7–4.0)
MCH: 23.4 pg — ABNORMAL LOW (ref 26.0–34.0)
MCHC: 33 g/dL (ref 30.0–36.0)
MCV: 70.9 fL — AB (ref 78.0–100.0)
MONO ABS: 0.6 10*3/uL (ref 0.1–1.0)
Monocytes Relative: 9 % (ref 3–12)
Neutro Abs: 3.4 10*3/uL (ref 1.7–7.7)
Neutrophils Relative %: 48 % (ref 43–77)
PLATELETS: 256 10*3/uL (ref 150–400)
RBC: 5.26 MIL/uL (ref 4.22–5.81)
RDW: 15.7 % — ABNORMAL HIGH (ref 11.5–15.5)
WBC: 7.2 10*3/uL (ref 4.0–10.5)

## 2013-12-08 LAB — BASIC METABOLIC PANEL
ANION GAP: 10 (ref 5–15)
BUN: 10 mg/dL (ref 6–23)
CALCIUM: 9.3 mg/dL (ref 8.4–10.5)
CO2: 27 mEq/L (ref 19–32)
CREATININE: 1.2 mg/dL (ref 0.50–1.35)
Chloride: 103 mEq/L (ref 96–112)
GFR, EST NON AFRICAN AMERICAN: 80 mL/min — AB (ref >90)
Glucose, Bld: 102 mg/dL — ABNORMAL HIGH (ref 70–99)
Potassium: 3.9 mEq/L (ref 3.7–5.3)
SODIUM: 140 meq/L (ref 137–147)

## 2013-12-08 LAB — TROPONIN I

## 2013-12-08 MED ORDER — NAPROXEN 375 MG PO TABS: 375.0000 mg | ORAL_TABLET | Freq: Two times a day (BID) | ORAL | Status: DC

## 2013-12-08 NOTE — ED Notes (Signed)
Pt reports intermittent CP x 1 week.  (L) sided, non-radiating.  Denies N/V/diaphoresis.  Reports SOB today-no respiratory distress noted.  Pt ambulatory.  Denies long car trips.

## 2013-12-08 NOTE — Discharge Instructions (Signed)

## 2013-12-08 NOTE — ED Provider Notes (Signed)
CSN: [id number]     Arrival date & time 12/08/13  1006 History   First MD Initiated Contact with Patient 12/08/13 1013     Chief Complaint  Patient presents with  . Chest Pain     (Consider location/radiation/quality/duration/timing/severity/associated sxs/prior Treatment) HPI Comments: Patient presents with chest pain. He states it started about a week ago and is been intermittent throughout the week. He states he was pretty much all day yesterday. It wasn't bothering him during the night when he woke up this morning it started again it has been ongoing since that time. He describes a little bit of pain currently. He describes it as an achiness/tightness to his left chest. It's nonradiating. He denies any nausea vomiting or diaphoresis. He does report some mild associated shortness of breath this morning. He denies any leg swelling or calf tenderness. He denies any long trips or recent surgeries. He's a nonsmoker. He denies history of DVTs. He denies any history of coronary artery disease, other heart problems, diabetes, hypertension, or hyperlipidemia. He denies any known family history of early heart disease.  Patient is a 28 y.o. male presenting with chest pain.  Chest Pain Associated symptoms: shortness of breath   Associated symptoms: no abdominal pain, no back pain, no cough, no diaphoresis, no dizziness, no fatigue, no fever, no headache, no nausea, no numbness, not vomiting and no weakness     History reviewed. No pertinent past medical history. History reviewed. No pertinent past surgical history. History reviewed. No pertinent family history. History  Substance Use Topics  . Smoking status: Never Smoker   . Smokeless tobacco: Not on file     Comment: quit in January  . Alcohol Use: No    Review of Systems  Constitutional: Negative for fever, chills, diaphoresis and fatigue.  HENT: Negative for congestion, rhinorrhea and sneezing.   Eyes: Negative.   Respiratory: Positive  for shortness of breath. Negative for cough and chest tightness.   Cardiovascular: Positive for chest pain. Negative for leg swelling.  Gastrointestinal: Negative for nausea, vomiting, abdominal pain, diarrhea and blood in stool.  Genitourinary: Negative for frequency, hematuria, flank pain and difficulty urinating.  Musculoskeletal: Negative for arthralgias and back pain.  Skin: Negative for rash.  Neurological: Negative for dizziness, speech difficulty, weakness, numbness and headaches.      Allergies  Review of patient's allergies indicates no known allergies.  Home Medications   Prior to Admission medications   Medication Sig Start Date End Date Taking? Authorizing Provider  amoxicillin (AMOXIL) 500 MG capsule Take 1 capsule (500 mg total) by mouth 3 (three) times daily. 08/08/13   Glynn OctaveStephen Rancour, MD  clindamycin (CLEOCIN) 300 MG capsule Take 1 capsule (300 mg total) by mouth 4 (four) times daily. X 7 days 12/29/12   Mora BellmanHannah S Merrell, PA-C  guaiFENesin (MUCINEX) 600 MG 12 hr tablet Take 1,200 mg by mouth 2 (two) times daily.    Historical Provider, MD  naproxen (NAPROSYN) 375 MG tablet Take 1 tablet (375 mg total) by mouth 2 (two) times daily. 12/08/13   Rolan BuccoMelanie Bunny Lowdermilk, MD  Pseudoeph-Doxylamine-DM-APAP (NYQUIL PO) Take 15 mLs by mouth every 6 (six) hours as needed (cold symptoms).    Historical Provider, MD   BP 154/99  Pulse 74  Temp(Src) 98.7 F (37.1 C) (Oral)  Resp 16  Ht 5\' 6"  (1.676 m)  Wt 240 lb (108.863 kg)  BMI 38.76 kg/m2  SpO2 100% Physical Exam  Constitutional: He is oriented to person, place, and time. He  appears well-developed and well-nourished.  HENT:  Head: Normocephalic and atraumatic.  Eyes: Pupils are equal, round, and reactive to light.  Neck: Normal range of motion. Neck supple.  Cardiovascular: Normal rate, regular rhythm and normal heart sounds.   Pulmonary/Chest: Effort normal and breath sounds normal. No respiratory distress. He has no wheezes. He  has no rales. He exhibits no tenderness.  Some reproducible tenderness to left lower chest wall  Abdominal: Soft. Bowel sounds are normal. There is no tenderness. There is no rebound and no guarding.  Musculoskeletal: Normal range of motion. He exhibits no edema.  No calf tenderness  Lymphadenopathy:    He has no cervical adenopathy.  Neurological: He is alert and oriented to person, place, and time.  Skin: Skin is warm and dry. No rash noted.  Psychiatric: He has a normal mood and affect.    ED Course  Procedures (including critical care time) Labs Review Results for orders placed during the hospital encounter of 12/08/13  CBC WITH DIFFERENTIAL      Result Value Ref Range   WBC 7.2  4.0 - 10.5 K/uL   RBC 5.26  4.22 - 5.81 MIL/uL   Hemoglobin 12.3 (*) 13.0 - 17.0 g/dL   HCT 25.9 (*) 56.3 - 87.5 %   MCV 70.9 (*) 78.0 - 100.0 fL   MCH 23.4 (*) 26.0 - 34.0 pg   MCHC 33.0  30.0 - 36.0 g/dL   RDW 64.3 (*) 32.9 - 51.8 %   Platelets 256  150 - 400 K/uL   Neutrophils Relative % 48  43 - 77 %   Neutro Abs 3.4  1.7 - 7.7 K/uL   Lymphocytes Relative 37  12 - 46 %   Lymphs Abs 2.7  0.7 - 4.0 K/uL   Monocytes Relative 9  3 - 12 %   Monocytes Absolute 0.6  0.1 - 1.0 K/uL   Eosinophils Relative 7 (*) 0 - 5 %   Eosinophils Absolute 0.5  0.0 - 0.7 K/uL   Basophils Relative 0  0 - 1 %   Basophils Absolute 0.0  0.0 - 0.1 K/uL  BASIC METABOLIC PANEL      Result Value Ref Range   Sodium 140  137 - 147 mEq/L   Potassium 3.9  3.7 - 5.3 mEq/L   Chloride 103  96 - 112 mEq/L   CO2 27  19 - 32 mEq/L   Glucose, Bld 102 (*) 70 - 99 mg/dL   BUN 10  6 - 23 mg/dL   Creatinine, Ser 8.41  0.50 - 1.35 mg/dL   Calcium 9.3  8.4 - 66.0 mg/dL   GFR calc non Af Amer 80 (*) >90 mL/min   GFR calc Af Amer >90  >90 mL/min   Anion gap 10  5 - 15  TROPONIN I      Result Value Ref Range   Troponin I <0.30  <0.30 ng/mL   Dg Chest 2 View  12/08/2013   CLINICAL DATA:  One week history of chest pain; history of  previous tobacco use  EXAM: CHEST  2 VIEW  COMPARISON:  None.  FINDINGS: The lungs are adequately inflated. The interstitial markings are slightly increased. There is no alveolar infiltrate. The heart and mediastinal structures are normal. There is no pleural effusion. The bony thorax is unremarkable.  IMPRESSION: There is no acute pneumonia. One cannot exclude acute bronchitis in the appropriate clinical setting.   Electronically Signed   By: David  [place]  On: 12/08/2013 11:10      Imaging Review Dg Chest 2 View  12/08/2013   CLINICAL DATA:  One week history of chest pain; history of previous tobacco use  EXAM: CHEST  2 VIEW  COMPARISON:  None.  FINDINGS: The lungs are adequately inflated. The interstitial markings are slightly increased. There is no alveolar infiltrate. The heart and mediastinal structures are normal. There is no pleural effusion. The bony thorax is unremarkable.  IMPRESSION: There is no acute pneumonia. One cannot exclude acute bronchitis in the appropriate clinical setting.   Electronically Signed   By: David  [place]   On: 12/08/2013 11:10     EKG Interpretation   Date/Time:  Friday December 08 2013 10:10:49 EDT Ventricular Rate:  78 PR Interval:  172 QRS Duration: 82 QT Interval:  362 QTC Calculation: 412 R Axis:   16 Text Interpretation:  Normal sinus rhythm with sinus arrhythmia Normal ECG  since last tracing no significant change Confirmed by Mammie Meras  MD, Mendy Chou  ([id number]) on 12/08/2013 10:27:40 AM      MDM   Final diagnoses:  Chest pain, unspecified chest pain type    Patient presents with atypical type chest pain. He has no associated symptoms other than some mild shortness of breath. He's talking in full senses and has normal oxygen saturations. He has no significant past medical history or other risk factors that would be associated with coronary artery disease. His symptoms are nonexertional. He has a heart score of 1. He is perc negative without symptoms that  would be more consistent with pulmonary embolus. His chest x-ray shows some possible bronchitis although he doesn't have cough or other symptoms that would be more suggestive of bronchitis. He was discharged home in good condition. He was given a list of resources for outpatient followup. He was advised to return here if his symptoms worsen or are not improving.    Rolan Bucco, MD 12/08/13 [phone]

## 2013-12-11 ENCOUNTER — Other Ambulatory Visit: Payer: Self-pay

## 2013-12-11 ENCOUNTER — Emergency Department (HOSPITAL_BASED_OUTPATIENT_CLINIC_OR_DEPARTMENT_OTHER)
Admission: EM | Admit: 2013-12-11 | Discharge: 2013-12-11 | Disposition: A | Discharge status: Home / Self Care | Payer: Self-pay | Attending: Emergency Medicine | Admitting: Emergency Medicine

## 2013-12-11 ENCOUNTER — Encounter (HOSPITAL_BASED_OUTPATIENT_CLINIC_OR_DEPARTMENT_OTHER): Payer: Self-pay | Admitting: Emergency Medicine

## 2013-12-11 DIAGNOSIS — Z79899 Other long term (current) drug therapy: Secondary | ICD-10-CM | POA: Insufficient documentation

## 2013-12-11 DIAGNOSIS — R079 Chest pain, unspecified: Secondary | ICD-10-CM | POA: Insufficient documentation

## 2013-12-11 DIAGNOSIS — R0789 Other chest pain: Secondary | ICD-10-CM

## 2013-12-11 DIAGNOSIS — R072 Precordial pain: Secondary | ICD-10-CM | POA: Insufficient documentation

## 2013-12-11 DIAGNOSIS — Z791 Long term (current) use of non-steroidal anti-inflammatories (NSAID): Secondary | ICD-10-CM | POA: Insufficient documentation

## 2013-12-11 NOTE — ED Notes (Signed)
Pt reports that he now has right sided neck pain with his left sided chest pain, states pain never went away, just got worse

## 2013-12-11 NOTE — ED Notes (Signed)
Chest pain. States he was here 3 days ago for same.

## 2013-12-11 NOTE — Discharge Instructions (Signed)

## 2013-12-11 NOTE — ED Provider Notes (Signed)
TIME SEEN: 9:45 PM  CHIEF COMPLAINT: Chest pain, right neck pain  HPI: Patient is a 28 year old male with no significant past medical history who presents to the emergency department with complaints of intermittent left-sided chest pain that he describes as a sharp, throbbing pain and right neck pain. Pain is worse with palpation of his chest and movement of his arm and neck. No associated shortness of breath, nausea vomiting, diaphoresis or dizziness. No history of premature CAD in his family. Patient denies a history of hypertension, hyperlipidemia, diabetes or tobacco use. No history of PE or DVT, recent prolonged immobilization such as long flight or hospitalization, recent fracture, surgery, trauma. No lower extremity swelling or pain. No fevers or cough. He was in the emergency department 3 days ago for similar symptoms and was given a prescription for Naprosyn. He states he is back in the emergency department because "I thought the medicine would start working by now".  ROS: See HPI Constitutional: no fever  Eyes: no drainage  ENT: no runny nose   Cardiovascular:   chest pain  Resp: no SOB  GI: no vomiting GU: no dysuria Integumentary: no rash  Allergy: no hives  Musculoskeletal: no leg swelling  Neurological: no slurred speech ROS otherwise negative  PAST MEDICAL HISTORY/PAST SURGICAL HISTORY:  History reviewed. No pertinent past medical history.  MEDICATIONS:  Prior to Admission medications   Medication Sig Start Date End Date Taking? Authorizing Provider  amoxicillin (AMOXIL) 500 MG capsule Take 1 capsule (500 mg total) by mouth 3 (three) times daily. 08/08/13   Glynn OctaveStephen Rancour, MD  clindamycin (CLEOCIN) 300 MG capsule Take 1 capsule (300 mg total) by mouth 4 (four) times daily. X 7 days 12/29/12   Mora BellmanHannah S Merrell, PA-C  guaiFENesin (MUCINEX) 600 MG 12 hr tablet Take 1,200 mg by mouth 2 (two) times daily.    Historical Provider, MD  naproxen (NAPROSYN) 375 MG tablet Take 1  tablet (375 mg total) by mouth 2 (two) times daily. 12/08/13   Rolan BuccoMelanie Belfi, MD  Pseudoeph-Doxylamine-DM-APAP (NYQUIL PO) Take 15 mLs by mouth every 6 (six) hours as needed (cold symptoms).    Historical Provider, MD    ALLERGIES:  No Known Allergies  SOCIAL HISTORY:  History  Substance Use Topics  . Smoking status: Never Smoker   . Smokeless tobacco: Not on file     Comment: quit in January  . Alcohol Use: No    FAMILY HISTORY: No family history on file.  EXAM: BP 163/91  Pulse 109  Temp(Src) 98.2 F (36.8 C) (Oral)  Resp 22  Ht 5\' 6"  (1.676 m)  Wt 240 lb (108.863 kg)  BMI 38.76 kg/m2  SpO2 100% CONSTITUTIONAL: Alert and oriented and responds appropriately to questions. Well-appearing; well-nourished HEAD: Normocephalic EYES: Conjunctivae clear, PERRL ENT: normal nose; no rhinorrhea; moist mucous membranes; pharynx without lesions noted NECK: Supple, no meningismus, no LAD  CARD: RRR; heart rate in the 80s when I saw him, S1 and S2 appreciated; no murmurs, no clicks, no rubs, no gallops patient is tender to palpation of the left chest wall and right trapezius muscle without ecchymosis or step-off or deformity or crepitus RESP: Normal chest excursion without splinting or tachypnea; breath sounds clear and equal bilaterally; no wheezes, no rhonchi, no rales,  ABD/GI: Normal bowel sounds; non-distended; soft, non-tender, no rebound, no guarding BACK:  The back appears normal and is non-tender to palpation, there is no CVA tenderness EXT: Normal ROM in all joints; non-tender to palpation; no edema;  normal capillary refill; no cyanosis    SKIN: Normal color for age and race; warm NEURO: Moves all extremities equally PSYCH: The patient's mood and manner are appropriate. Grooming and personal hygiene are appropriate.  MEDICAL DECISION MAKING: Patient here with chest wall pain. He has no risk factors for ACS and is PERC negative.  His pain is reproducible with palpation of his  chest wall movement of his left arm. He was here 3 days ago and had normal labs including negative troponin and a normal chest x-ray. EKG today shows no arrhythmia, ischemic changes. I feel he is safe to be discharged home. Attempted to reassure patient that he should continue Naprosyn for pain. Discussed strict return precautions and supportive care instructions. He was given outpatient resource guide to followup with a primary care physician. He states he will contact them tomorrow for PCP followup.      Date: 12/11/2013 21:33  Rate: 93  Rhythm: normal sinus rhythm  QRS Axis: normal  Intervals: normal  ST/T Wave abnormalities: normal  Conduction Disutrbances: none  Narrative Interpretation: unremarkable      Layla Maw Lamel Mccarley, DO 12/11/13 2244

## 2013-12-12 ENCOUNTER — Encounter (HOSPITAL_COMMUNITY): Payer: Self-pay | Admitting: Emergency Medicine

## 2013-12-12 ENCOUNTER — Emergency Department (HOSPITAL_COMMUNITY)
Admission: EM | Admit: 2013-12-12 | Discharge: 2013-12-13 | Disposition: A | Discharge status: Home / Self Care | Payer: Self-pay | Attending: Emergency Medicine | Admitting: Emergency Medicine

## 2013-12-12 DIAGNOSIS — R0789 Other chest pain: Secondary | ICD-10-CM

## 2013-12-12 DIAGNOSIS — Z79899 Other long term (current) drug therapy: Secondary | ICD-10-CM | POA: Insufficient documentation

## 2013-12-12 DIAGNOSIS — Z87891 Personal history of nicotine dependence: Secondary | ICD-10-CM | POA: Insufficient documentation

## 2013-12-12 DIAGNOSIS — R071 Chest pain on breathing: Secondary | ICD-10-CM | POA: Insufficient documentation

## 2013-12-12 DIAGNOSIS — R079 Chest pain, unspecified: Secondary | ICD-10-CM | POA: Insufficient documentation

## 2013-12-12 LAB — I-STAT CHEM 8, ED
BUN: 11 mg/dL (ref 6–23)
CALCIUM ION: 1.19 mmol/L (ref 1.12–1.23)
Chloride: 103 mEq/L (ref 96–112)
Creatinine, Ser: 1.1 mg/dL (ref 0.50–1.35)
GLUCOSE: 99 mg/dL (ref 70–99)
HEMATOCRIT: 43 % (ref 39.0–52.0)
Hemoglobin: 14.6 g/dL (ref 13.0–17.0)
Potassium: 3.6 mEq/L — ABNORMAL LOW (ref 3.7–5.3)
Sodium: 141 mEq/L (ref 137–147)
TCO2: 24 mmol/L (ref 0–100)

## 2013-12-12 LAB — I-STAT TROPONIN, ED: TROPONIN I, POC: 0 ng/mL (ref 0.00–0.08)

## 2013-12-12 MED ORDER — NAPROXEN 500 MG PO TABS
500.0000 mg | ORAL_TABLET | Freq: Once | ORAL | Status: AC
  Administered 2013-12-12: 500 mg via ORAL
  Filled 2013-12-12: qty 1

## 2013-12-12 NOTE — ED Notes (Signed)
Pt states he has been having chest pain off and on for a week  Pt states the pain comes and goes and varies in intensity from a squeezing to a sharp pain   Pt states his neck has been feeling tight too  Pt states he was seen at [facility] today and had an EKG with difference from last night  Pt states he was seen at [facility] last night for same and was told he had chest wall pain  [facility]

## 2013-12-12 NOTE — ED Provider Notes (Signed)
CSN: [id number]     Arrival date & time 12/12/13  2028 History   First MD Initiated Contact with Patient 12/12/13 2302     Chief Complaint  Patient presents with  . Chest Pain     (Consider location/radiation/quality/duration/timing/severity/associated sxs/prior Treatment) The history is provided by the patient and medical records. No language interpreter was used.    Derek Reed is a 28 y.o. male  with no significant past medical history presents to the Emergency Department complaining of intermittent left-sided chest pain beginning approximately one week ago. He describes the pain as sharp and throbbing located in the left chest only. He denies radiation of the pain. He reports pain is worse with palpation and movement of his left arm. He denies headache neck pain, shortness of breath, cough, abdominal pain, nausea, vomiting, diarrhea, weakness, dizziness presyncope, dysuria, diaphoresis.  He denies personal or family cardiac history.  He also denies history of hypertension, hyperlipidemia, diabetes. He denies alcohol or tobacco use.  He has no history of DVT or PE, denies recent travel, prolonged immobilization, recent surgery, fracture or trauma.  No leg swelling.    Record review shows that patient [facility] same and diagnosed with chest wall pain.  [facility] [place] [facility].  Both times ECG was nonischemic with sinus arrhythmia or normal sinus rhythm.   History reviewed. No pertinent past medical history. History reviewed. No pertinent past surgical history. Family History  Problem Relation Age of Onset  . Hyperlipidemia Mother   . Diabetes Mother   . Stroke Mother   . Hypertension Mother   . Hypertension Father    History  Substance Use Topics  . Smoking status: Former Games developermoker  . Smokeless tobacco: Not on file     Comment: quit in January  . Alcohol Use: No    Review of Systems  Constitutional: Negative  for fever, diaphoresis, appetite change, fatigue and unexpected weight change.  HENT: Negative for mouth sores.   Eyes: Negative for visual disturbance.  Respiratory: Negative for cough, chest tightness, shortness of breath and wheezing.   Cardiovascular: Positive for chest pain.  Gastrointestinal: Negative for nausea, vomiting, abdominal pain, diarrhea and constipation.  Endocrine: Negative for polydipsia, polyphagia and polyuria.  Genitourinary: Negative for dysuria, urgency, frequency and hematuria.  Musculoskeletal: Negative for back pain and neck stiffness.  Skin: Negative for rash.  Allergic/Immunologic: Negative for immunocompromised state.  Neurological: Negative for syncope, light-headedness and headaches.  Hematological: Does not bruise/bleed easily.  Psychiatric/Behavioral: Negative for sleep disturbance. The patient is not nervous/anxious.       Allergies  Review of patient's allergies indicates no known allergies.  Home Medications   Prior to Admission medications   Medication Sig Start Date End Date Taking? Authorizing Provider  naproxen (NAPROSYN) 375 MG tablet Take 1 tablet (375 mg total) by mouth 2 (two) times daily. 12/08/13  Yes Rolan BuccoMelanie Belfi, MD   BP 137/75  Pulse 73  Temp(Src) 98.3 F (36.8 C) (Oral)  Resp 16  Ht 5\' 6"  (1.676 m)  Wt 245 lb (111.131 kg)  BMI 39.56 kg/m2  SpO2 100% Physical Exam  Nursing note and vitals reviewed. Constitutional: He is oriented to person, place, and time. He appears well-developed and well-nourished. No distress.  Awake, alert, nontoxic appearance  HENT:  Head: Normocephalic and atraumatic.  Mouth/Throat: Oropharynx is clear and moist. No oropharyngeal exudate.  Eyes: Conjunctivae are normal. No scleral icterus.  Neck: Normal range of motion. Neck supple.  Cardiovascular: Normal rate, regular rhythm, normal heart sounds and intact distal pulses.   No murmur heard. Pulmonary/Chest: Effort normal and breath sounds normal.  No respiratory distress. He has no wheezes. He exhibits tenderness (left-sided).  Reversal chest pain with movement of left arm and palpation of left chest Normal chest expansion Breath sounds clear and equal bilaterally  Abdominal: Soft. Bowel sounds are normal. He exhibits no mass. There is no tenderness. There is no rebound and no guarding.  Abdomen soft and nontender  Musculoskeletal: Normal range of motion. He exhibits no edema.  No peripheral edema  Neurological: He is alert and oriented to person, place, and time.  Speech is clear and goal oriented Moves extremities without ataxia  Skin: Skin is warm and dry. He is not diaphoretic.  Psychiatric: He has a normal mood and affect.    ED Course  Procedures (including critical care time) Labs Review Labs Reviewed  I-STAT CHEM 8, ED - Abnormal; Notable for the following:    Potassium 3.6 (*)    All other components within normal limits  I-STAT TROPOININ, ED    Imaging Review No results found.   EKG Interpretation   Date/Time:  Wednesday December 13 2013 00:07:13 EDT Ventricular Rate:  63 PR Interval:  194 QRS Duration: 79 QT Interval:  411 QTC Calculation: 421 R Axis:   34 Text Interpretation:  Sinus rhythm Confirmed by YELVERTON  MD, DAVID  ([id number]) on 12/13/2013 1:45:30 AM         MDM   Final diagnoses:  Chest wall pain   Derek Reed presents with left-sided chest wall pain.  This is his third visit in one week. He has no risk factors for acute coronary syndrome and as PERC negative.  His pain is reproducible with palpation of his chest wall and movement of his left arm.  He has had normal EKGs on each visit and normal troponin each time as well. He is also had a normal chest x-ray, is afebrile and has clear and equal breath sounds; no concern for pneumonia.  Patient reports that the Naprosyn is not helping however have recommended that he continue to take it. He expresses concern about his persistent chest pain and  I have recommended that he followup with Dr. Juanito Doom of cardiology for further discussion.  Patient also given resources for the community health and wellness Center.  I have personally reviewed patient's vitals, nursing note and any pertinent labs or imaging.  I performed an undressed physical exam.    At this time, it has been determined that no acute conditions requiring further emergency intervention. The patient/guardian have been advised of the diagnosis and plan. I reviewed all labs and imaging including any potential incidental findings. We have discussed signs and symptoms that warrant return to the ED, such as chest pain with exertion, chest pain that radiates to the left jaw or is associated with nausea and vomiting.  Patient/guardian has voiced understanding and agreed to follow-up with the PCP or specialist in one week.  Vital signs are stable at discharge.   BP 137/75  Pulse 73  Temp(Src) 98.3 F (36.8 C) (Oral)  Resp 16  Ht 5\' 6"  (1.676 m)  Wt 245 lb (111.131 kg)  BMI 39.56 kg/m2  SpO2 100%        Dierdre Forth, PA-C 12/13/13 0145

## 2013-12-13 NOTE — Discharge Instructions (Signed)
1. Medications: continue naprosyn, usual home medications 2. Treatment: rest, drink plenty of fluids, gentle stretching 3. Follow Up: Please followup with Tom Wall in 1 week for further discussion of your chest pain and the [facility] to set up a PCP    Chest Wall Pain Chest wall pain is pain in or around the bones and muscles of your chest. It may take up to 6 weeks to get better. It may take longer if you must stay physically active in your work and activities.  CAUSES  Chest wall pain may happen on its own. However, it may be caused by:  A viral illness like the flu.  Injury.  Coughing.  Exercise.  Arthritis.  Fibromyalgia.  Shingles. HOME CARE INSTRUCTIONS   Avoid overtiring physical activity. Try not to strain or perform activities that cause pain. This includes any activities using your chest or your abdominal and side muscles, especially if heavy weights are used.  Put ice on the sore area.  Put ice in a plastic bag.  Place a towel between your skin and the bag.  Leave the ice on for 15-20 minutes per hour while awake for the first 2 days.  Only take over-the-counter or prescription medicines for pain, discomfort, or fever as directed by your caregiver. SEEK IMMEDIATE MEDICAL CARE IF:   Your pain increases, or you are very uncomfortable.  You have a fever.  Your chest pain becomes worse.  You have new, unexplained symptoms.  You have nausea or vomiting.  You feel sweaty or lightheaded.  You have a cough with phlegm (sputum), or you cough up blood. MAKE SURE YOU:   Understand these instructions.  Will watch your condition.  Will get help right away if you are not doing well or get worse. Document Released: 04/27/2005 Document Revised: 07/20/2011 Document Reviewed: 12/22/2010 [facility] Patient Information 2015 [place], [place]. This information is not intended to replace advice given to you by your health care provider. Make sure you discuss  any questions you have with your health care provider.    Emergency Department Resource Guide 1) Find a Doctor and Pay Out of Pocket Although you won't have to find out who is covered by your insurance plan, it is a good idea to ask around and get recommendations. You will then need to call the office and see if the doctor you have chosen will accept you as a new patient and what types of options they offer for patients who are self-pay. Some doctors offer discounts or will set up payment plans for their patients who do not have insurance, but you will need to ask so you aren't surprised when you get to your appointment.  2) Contact Your Local Health Department Not all health departments have doctors that can see patients for sick visits, but many do, so it is worth a call to see if yours does. If you don't know where your local health department is, you can check in your phone book. The CDC also has a tool to help you locate your state's health department, and many state websites also have listings of all of their local health departments.  3) Find a Walk-in Clinic If your illness is not likely to be very severe or complicated, you may want to try a walk in clinic. These are popping up all over the country in pharmacies, drugstores, and shopping centers. They're usually staffed by nurse practitioners or physician assistants that have been trained to treat common illnesses and  complaints. They're usually fairly quick and inexpensive. However, if you have serious medical issues or chronic medical problems, these are probably not your best option.  No Primary Care Doctor: - Call Health Connect at  [phone] - they can help you locate a primary care doctor that  accepts your insurance, provides certain services, etc. - Physician Referral Service- [phone]  Chronic Pain Problems: Organization         Address  Phone   Notes  [facility]  [phone]) [phone] Patients need to be  referred by their primary care doctor.   Medication Assistance: Organization         Address  Phone   Notes  [facility] Medication [facility] [address] [place], [place] [address] [phone] --Must be a resident of [facility] [place] -- Must have NO insurance coverage whatsoever (no Medicaid/ Medicare, etc.) -- The pt. MUST have a primary care doctor that directs their care regularly and follows them in the community   MedAssist  [phone]   [employer]  [phone]    Agencies that provide inexpensive medical care: Organization         Address  Phone   Notes  [facility]  [phone]) [phone]   [facility]    [phone]) [phone]   [facility] [address] [place], [place] [address] [phone]   [facility] [address], [place] [phone]   Planned Parenthood    [phone]   [facility]    [phone](336) [phone]   [facility]  [address], [place] Phone:  [phone]) [phone], Fax:  [phone] Hours of Operation:  9 am - 6 pm, M-F.  Also accepts Medicaid/Medicare and self-pay.  [facility]  [address], [place] Phone: [phone]) [phone], Fax: [phone]. Hours of Operation:  8:30 am - 5:30 pm, M-F.  Also accepts Medicaid and self-pay.  [facility] [place] [address], [place] Phone: [phone](336) [phone]   [facility] [address], [place], [place] [phone], Ext. [phone] Mondays & Thursdays: 7-9 AM.  First 15 patients are seen on a first come, first serve basis.    Medicaid-accepting [facility] Providers:  Organization         Address  Phone   Notes  [facility] [address], [place] [phone] Also accepts self-pay patients.  [facility] [address], [place]  [phone](336) [phone]   [facility] [address], [place] [phone]) [phone]   [facility] [address] [place] [address], [place] [phone]) [phone]   Renaye RakersVeita Bland [address], [place]   [phone] Only accepts [place] Access [place] patients after they have their name applied to their card.   Self-Pay (no insurance) in [facility] [place]:  Organization         Address  Phone   Notes  Sickle Cell Patients, [facility] [address], [place] [phone]) [phone]   [facility] [address], [place] [phone]) [phone]   [facility]  [address], [place] [phone]) [phone]   [facility]/Dr. Osei-Bonsu  [address] [place] [address], [place] or [phone], [place] (  [phone] Phone number for both [facility] and [facility] locations is the same.  [facility] [address] [place] [phone]   [facility] [address], [place] or [address] [phone]   [facility] [address] [place] [phone], phone; [phone], fax Sees patients 1st and 3rd Saturday of every month.  Must not qualify for public or private insurance (i.e. Medicaid, Medicare, [place] Health Choice, Veterans' Benefits)  Household income should be no more than 200% of the poverty level The clinic cannot treat you if you are pregnant or think you are pregnant  Sexually transmitted diseases are not treated at the clinic.    Dental Care: Organization         Address  Phone  Notes  [facility] Department of [facility] [address] [place], [place] [phone] Accepts children up to age 76 who are enrolled in [place] or [place] Health Choice; pregnant women with a Medicaid card; and children who have applied for Medicaid or [place] Health Choice, but were declined, whose parents can  pay a reduced fee at time of service.  [facility] Department of [facility]  [address], [place] [phone] Accepts children up to age 22 who are enrolled in [place] or [place] Health Choice; pregnant women with a Medicaid card; and children who have applied for Medicaid or  Health Choice, but were declined, whose parents can pay a reduced fee at time of service.  [facility]  [address] [place], [place] [phone] Patients are seen by appointment only. Walk-ins are not accepted. [facility] will see patients 58 years of age and older. Monday - Tuesday (8am-5pm) Most Wednesdays (8:30-5pm) $30 per visit, cash only  [facility] Adult Dental Access PROGRAM  [address], [facility] [phone] Patients are seen by appointment only. Walk-ins are not accepted. [facility] will see patients 8 years of age and older. One Wednesday Evening (Monthly: Volunteer Based).  $30 per visit, cash only  [employer]  [phone] for adults; Children under age 54, call Graduate Pediatric Dentistry at [phone]. Children aged 10-14, please call [phone] to request a pediatric application.  Dental services are provided in all areas of dental care including fillings, crowns and bridges, complete and partial dentures, implants, gum treatment, root canals, and extractions. Preventive care is also provided. Treatment is provided to both adults and children. Patients are selected via a lottery and there is often a waiting list.   [facility] [address], [place]  [phone] [email or web address]   [facility] [address] [place], [place] [phone], Ext. [phone] Second and Fourth Thursday of each month, opens at 6:30 AM; Clinic ends at 9 AM.  Patients are seen on a first-come first-served basis, and a limited number are seen during each clinic.   [facility]  [address] [place], [place] [phone]   Eligibility Requirements You must have lived in [place], [place], or [place] counties for at least the last three months.   You cannot be eligible for state or federal sponsored [place], including [employer], [place], or [employer].   You generally cannot be eligible for healthcare insurance through your employer.    How to apply: Eligibility screenings are held every Tuesday and Wednesday afternoon  from 1:00 pm until 4:00 pm. You do not need an appointment for the interview!  [facility] [address], [place], [place] [phone]   [facility] Health Department  [phone]   [facility] Health Department  [phone]   [facility] Health Department  [phone]    Behavioral Health Resources in the Community: Intensive Outpatient Programs Organization         Address  Phone  Notes  [facility] [address], [place], [place] [phone]   [facility] [address] [place], [place] [phone]   [facility]: Alcohol & Drug Svcs [address], [place], [place]  [phone]   [facility] Mental Health [address]  [place], [place] [phone] or [phone]   Substance Abuse Resources Organization         Address  Phone  Notes  Alcohol and Drug Services  9194824121   [facility]  [phone]   [facility]  8450267657   Floydene Flock  [phone]   Residential & Outpatient Substance Abuse Program  [phone]   Psychological Services Organization         Address  Phone  Notes  [facility] Behavioral Health  [phone]   [facility] Services  [phone]   [facility] [address], [place] [phone] or [phone]    Mobile Crisis Teams Organization         Address  Phone  Notes  [facility], Mobile Crisis Care Unit  [phone]    [facility]  [address] [place], [place] [phone]   Doristine Locks [address] [place] [phone]    Self-Help/Support Groups Organization         Address  Phone             Notes  [facility]ental Health Assoc. of [place] - variety of support groups  [phone]- I7437963 Call for more information  Narcotics Anonymous (NA), [facility] [address], [employer] [place]  2 meetings at this location   Statistician         Address  Phone  Notes  [facility] [address],    [place]  [phone]   [facility]  [address], [place] [address], [place], [place] [phone]   [facility] [address] [place], [place] [phone] Admissions: 8am-3pm M-F  [facility] [address],    [place], [place] [phone]   [facility] [address] [place], [place], [place] [phone]   [facility] [address],  [place], [place] [phone]   [facility] - Intensive Outpatient [address], [place], [place] [phone]   [facility] ([facility] [address] [place]  [place], [place] [phone] or [phone]   [facility] ([facility]) [address] [place], [place] [phone] Accepts Medicaid  [facility] [address],  [place] [phone] Substance Abuse/Addiction Treatment   [facility] Organization         Address  Phone  Notes  [facility]  3086982031   Angie Fava, PhD [address] [place], [place]   [phone] or [phone]   [facility]   [address] [place], [place] [phone]   [facility] [address] [place], [place] [phone] Insurance/Medicaid/sponsorship through [employer] and Families 232  [address]                                     [place], [place] [phone] Therapy/tele-psych/case  [facility] [address]   [place], [place] [phone]    Dr. Lolly Mustache  [phone]   [facility] [place]  [employer] [facility]. 1) [address] [place] 2) [address] [place] 3)  [address], [place] [phone]  [phone]   [facility] Child Abuse Hotline 902-407-3677 or [phone] (After Hours)

## 2013-12-13 NOTE — ED Provider Notes (Signed)
Medical screening examination/treatment/procedure(s) were performed by non-physician practitioner and as supervising physician I was immediately available for consultation/collaboration.   EKG Interpretation   Date/Time:  Wednesday December 13 2013 00:07:13 EDT Ventricular Rate:  63 PR Interval:  194 QRS Duration: 79 QT Interval:  411 QTC Calculation: 421 R Axis:   34 Text Interpretation:  Sinus rhythm Confirmed by Ranae Palms  MD, Parmvir Boomer  ([id number]) on 12/13/2013 1:45:30 AM        Loren Racer, MD 12/13/13 [phone]

## 2013-12-19 ENCOUNTER — Ambulatory Visit: Payer: Self-pay | Admitting: Internal Medicine

## 2013-12-27 ENCOUNTER — Ambulatory Visit: Payer: Self-pay | Admitting: Cardiology

## 2013-12-30 ENCOUNTER — Emergency Department (HOSPITAL_BASED_OUTPATIENT_CLINIC_OR_DEPARTMENT_OTHER)
Admission: EM | Admit: 2013-12-30 | Discharge: 2013-12-30 | Disposition: A | Discharge status: Home / Self Care | Payer: Self-pay | Attending: Emergency Medicine | Admitting: Emergency Medicine

## 2013-12-30 ENCOUNTER — Encounter (HOSPITAL_BASED_OUTPATIENT_CLINIC_OR_DEPARTMENT_OTHER): Payer: Self-pay | Admitting: Emergency Medicine

## 2013-12-30 DIAGNOSIS — J029 Acute pharyngitis, unspecified: Secondary | ICD-10-CM | POA: Insufficient documentation

## 2013-12-30 DIAGNOSIS — J039 Acute tonsillitis, unspecified: Secondary | ICD-10-CM | POA: Insufficient documentation

## 2013-12-30 DIAGNOSIS — Z791 Long term (current) use of non-steroidal anti-inflammatories (NSAID): Secondary | ICD-10-CM | POA: Insufficient documentation

## 2013-12-30 DIAGNOSIS — Z87891 Personal history of nicotine dependence: Secondary | ICD-10-CM | POA: Insufficient documentation

## 2013-12-30 LAB — RAPID STREP SCREEN (MED CTR MEBANE ONLY): Streptococcus, Group A Screen (Direct): NEGATIVE

## 2013-12-30 MED ORDER — DEXAMETHASONE 4 MG PO TABS
10.0000 mg | ORAL_TABLET | Freq: Once | ORAL | Status: AC
  Administered 2013-12-30: 10 mg via ORAL

## 2013-12-30 MED ORDER — DEXAMETHASONE 1 MG/ML PO CONC
ORAL | Status: AC
  Filled 2013-12-30: qty 1

## 2013-12-30 MED ORDER — IBUPROFEN 800 MG PO TABS
800.0000 mg | ORAL_TABLET | Freq: Once | ORAL | Status: AC
  Administered 2013-12-30: 800 mg via ORAL
  Filled 2013-12-30: qty 1

## 2013-12-30 MED ORDER — IBUPROFEN 800 MG PO TABS: 800.0000 mg | ORAL_TABLET | Freq: Three times a day (TID) | ORAL | Status: DC | PRN

## 2013-12-30 NOTE — ED Provider Notes (Signed)
CSN: [id number]     Arrival date & time 12/30/13  1027 History   First MD Initiated Contact with Patient 12/30/13 1135     Chief Complaint  Patient presents with  . Sore Throat     (Consider location/radiation/quality/duration/timing/severity/associated sxs/prior Treatment) HPI 28 year old male presents with sore throat since yesterday. Denies fevers. No change in his voice or trouble breathing or swallowing. It hurts to swallow but is able to do it. He rates the pain as a 3/10. He had a peritonsillar abscess last year and wanted to come in before it started again. Denies facial swelling. No sick contacts. He took some of his daughter's leftover amoxicillin last night and today.  History reviewed. No pertinent past medical history. History reviewed. No pertinent past surgical history. Family History  Problem Relation Age of Onset  . Hyperlipidemia Mother   . Diabetes Mother   . Stroke Mother   . Hypertension Mother   . Hypertension Father    History  Substance Use Topics  . Smoking status: Former Games developer  . Smokeless tobacco: Not on file     Comment: quit in January  . Alcohol Use: No    Review of Systems  Constitutional: Negative for fever.  HENT: Positive for sore throat. Negative for drooling, trouble swallowing and voice change.   Respiratory: Negative for cough.   All other systems reviewed and are negative.     Allergies  Review of patient's allergies indicates no known allergies.  Home Medications   Prior to Admission medications   Medication Sig Start Date End Date Taking? Authorizing Provider  ibuprofen (ADVIL,MOTRIN) 800 MG tablet Take 1 tablet (800 mg total) by mouth every 8 (eight) hours as needed for mild pain or moderate pain. 12/30/13   Audree Camel, MD  naproxen (NAPROSYN) 375 MG tablet Take 1 tablet (375 mg total) by mouth 2 (two) times daily. 12/08/13   Rolan Bucco, MD   BP 140/88  Pulse 83  Temp(Src) 98.2 F (36.8 C) (Oral)  Resp 18  Ht 5'  6" (1.676 m)  Wt 240 lb (108.863 kg)  BMI 38.76 kg/m2  SpO2 100% Physical Exam  Nursing note and vitals reviewed. Constitutional: He is oriented to person, place, and time. He appears well-developed and well-nourished.  HENT:  Head: Normocephalic and atraumatic.  Right Ear: External ear normal.  Left Ear: External ear normal.  Nose: Nose normal.  Mouth/Throat: No uvula swelling. Oropharyngeal exudate (bilateral hypertrophied tonsils with exudate. Equally swollen, +3) present. No tonsillar abscesses.  Eyes: Right eye exhibits no discharge. Left eye exhibits no discharge.  Neck: Neck supple.  Cardiovascular: Normal rate, regular rhythm, normal heart sounds and intact distal pulses.   Pulmonary/Chest: Effort normal and breath sounds normal. He has no wheezes. He has no rales.  Lymphadenopathy:    He has no cervical adenopathy.  Neurological: He is alert and oriented to person, place, and time.  Skin: Skin is warm and dry.    ED Course  Procedures (including critical care time) Labs Review Labs Reviewed  RAPID STREP SCREEN  CULTURE, GROUP A STREP    Imaging Review No results found.   EKG Interpretation None      MDM   Final diagnoses:  Tonsillitis    Patient's tonsils are bilaterally enlarged but equal and symmetric. No evidence of PTA. No evidence of strep pharyngitis. At this time given the hypertrophy of his tonsils will give a dose of Decadron. We'll treat with ibuprofen and Tylenol at home. No  indication for antibiotics at this time. Advised of strict return precautions and will followup with a PCP.    Audree CamelScott T Ryot Burrous, MD 12/30/13 [phone]

## 2013-12-30 NOTE — ED Notes (Signed)
Patient here with sore throat x 2 days, denies any other associated symptoms. Took 1 dose of amoxicillin this am that was prescribed to family member

## 2013-12-30 NOTE — Discharge Instructions (Signed)

## 2014-01-01 LAB — CULTURE, GROUP A STREP

## 2014-02-14 ENCOUNTER — Emergency Department (HOSPITAL_COMMUNITY): Payer: Self-pay

## 2014-02-14 ENCOUNTER — Encounter (HOSPITAL_COMMUNITY): Payer: Self-pay | Admitting: Emergency Medicine

## 2014-02-14 ENCOUNTER — Telehealth (HOSPITAL_BASED_OUTPATIENT_CLINIC_OR_DEPARTMENT_OTHER): Payer: Self-pay | Admitting: Emergency Medicine

## 2014-02-14 ENCOUNTER — Emergency Department (HOSPITAL_COMMUNITY)
Admission: EM | Admit: 2014-02-14 | Discharge: 2014-02-14 | Disposition: A | Discharge status: Home / Self Care | Payer: Self-pay | Attending: Emergency Medicine | Admitting: Emergency Medicine

## 2014-02-14 DIAGNOSIS — R0789 Other chest pain: Secondary | ICD-10-CM | POA: Insufficient documentation

## 2014-02-14 DIAGNOSIS — Z87891 Personal history of nicotine dependence: Secondary | ICD-10-CM | POA: Insufficient documentation

## 2014-02-14 MED ORDER — IBUPROFEN 600 MG PO TABS: 600.0000 mg | ORAL_TABLET | Freq: Four times a day (QID) | ORAL | Status: DC | PRN

## 2014-02-14 MED ORDER — KETOROLAC TROMETHAMINE 60 MG/2ML IM SOLN
60.0000 mg | Freq: Once | INTRAMUSCULAR | Status: AC
  Administered 2014-02-14: 60 mg via INTRAMUSCULAR
  Filled 2014-02-14: qty 2

## 2014-02-14 NOTE — Discharge Instructions (Signed)

## 2014-02-14 NOTE — ED Notes (Signed)
Pt reports sensation of heart fluttering or skipping beats for 2weeks. No changed today, just tired of the sensation. Sts he is much more tired than usual. Chest pain occasionally, not currently. No shob, n/v, diaphoresis.

## 2014-02-14 NOTE — ED Provider Notes (Signed)
CSN: [id number]     Arrival date & time 02/14/14  1249 History   First MD Initiated Contact with Patient 02/14/14 1315     Chief Complaint  Patient presents with  . Palpitations     (Consider location/radiation/quality/duration/timing/severity/associated sxs/prior Treatment) HPI  This is a 28 year old male who presents with chest pain. Patient reports anterior chest pain for the last week or 2. It is worse with movement and sometimes with palpation. Also reports intermittent feeling like his heart is "fluttering." He denies any shortness of breath, diaphoresis, history of blood clots. Denies any fevers or cough.  Currently his pain is 3/10. He has not taken anything for the pain. 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  History reviewed. No pertinent past medical history. History reviewed. No pertinent past surgical history. Family History  Problem Relation Age of Onset  . Hyperlipidemia Mother   . Diabetes Mother   . Stroke Mother   . Hypertension Mother   . Hypertension Father    History  Substance Use Topics  . Smoking status: Former Games developermoker  . Smokeless tobacco: Not on file     Comment: quit in January  . Alcohol Use: No    Review of Systems  Constitutional: Negative.  Negative for fever.  Respiratory: Positive for chest tightness. Negative for cough and shortness of breath.   Cardiovascular: Positive for palpitations. Negative for chest pain and leg swelling.  Gastrointestinal: Negative.  Negative for abdominal pain.  Genitourinary: Negative.  Negative for dysuria.  Musculoskeletal: Negative for back pain.  Skin: Negative for rash.  Neurological: Negative for headaches.  All other systems reviewed and are negative.     Allergies  Review of patient's allergies indicates no known allergies.  Home Medications   Prior to Admission medications   Medication Sig Start Date End Date Taking? Authorizing Provider  acetaminophen (TYLENOL) 500 MG tablet Take 1,000  mg by mouth every 6 (six) hours as needed for headache (headache).   Yes Historical Provider, MD  ibuprofen (ADVIL,MOTRIN) 600 MG tablet Take 1 tablet (600 mg total) by mouth every 6 (six) hours as needed. 02/14/14   Shon Batonourtney F Horton, MD   BP 137/89  Pulse 72  Temp(Src) 98.1 F (36.7 C) (Oral)  Resp 16  SpO2 96% Physical Exam  Nursing note and vitals reviewed. Constitutional: He is oriented to person, place, and time. He appears well-developed and well-nourished. No distress.  HENT:  Head: Normocephalic and atraumatic.  Eyes: Pupils are equal, round, and reactive to light.  Neck: Neck supple.  Cardiovascular: Normal rate, regular rhythm and normal heart sounds.   No murmur heard. Pulmonary/Chest: Effort normal and breath sounds normal. No respiratory distress. He has no wheezes. He exhibits tenderness.  Abdominal: Soft. Bowel sounds are normal. There is no tenderness. There is no rebound.  Musculoskeletal: He exhibits no edema.  Neurological: He is alert and oriented to person, place, and time.  Skin: Skin is warm and dry.  Psychiatric: He has a normal mood and affect.    ED Course  Procedures (including critical care time) Labs Review Labs Reviewed - No data to display  Imaging Review Dg Chest 2 View  02/14/2014   CLINICAL DATA:  One week history of generalized chest pain, more notable on the left than on the right. Cardiac palpitations  EXAM: CHEST  2 VIEW  COMPARISON:  December 08, 2013  FINDINGS: There is no edema or consolidation. Heart size and pulmonary vascularity are within normal limits.  No pneumothorax. No adenopathy. No bone lesions.  IMPRESSION: No edema or consolidation.   Electronically Signed   By: Bretta Bang M.D.   On: 02/14/2014 14:45     EKG Interpretation   Date/Time:  Wednesday February 14 2014 13:02:06 EDT Ventricular Rate:  88 PR Interval:  165 QRS Duration: 76 QT Interval:  369 QTC Calculation: 446 R Axis:   16 Text Interpretation:  Sinus rhythm  Similar to prior Confirmed by HORTON   MD, COURTNEY ([id number]) on 02/14/2014 3:37:18 PM      MDM   Final diagnoses:  Chest wall pain   Patient presents with one to 2 weeks of chest pain. Chest is reproducible on exam. Also reports intermittent palpitations. Vital signs are notable for a blood pressure 153/100. No known history of hypertension.  Given duration of pain and reproducibility on exam, low suspicion for ACS. EKG is nonischemic. Chest x-ray shows no evidence of pneumothorax or pneumonia. PERC neg.  Patient had improvement of symptoms with Toradol.  He does not have primary care physician. He needs repeat blood pressure management. Patient was encouraged to use ibuprofen at home for pain management. [facility] to [facility].  After history, exam, and medical workup I feel the patient has been appropriately medically screened and is safe for discharge home. Pertinent diagnoses were discussed with the patient. Patient was given return precautions.     Shon Baton, MD 02/14/14 [phone]

## 2014-02-17 ENCOUNTER — Emergency Department (HOSPITAL_BASED_OUTPATIENT_CLINIC_OR_DEPARTMENT_OTHER): Payer: Self-pay

## 2014-02-17 ENCOUNTER — Emergency Department (HOSPITAL_BASED_OUTPATIENT_CLINIC_OR_DEPARTMENT_OTHER)
Admission: EM | Admit: 2014-02-17 | Discharge: 2014-02-17 | Disposition: A | Discharge status: Home / Self Care | Payer: Self-pay | Attending: Emergency Medicine | Admitting: Emergency Medicine

## 2014-02-17 ENCOUNTER — Encounter (HOSPITAL_BASED_OUTPATIENT_CLINIC_OR_DEPARTMENT_OTHER): Payer: Self-pay | Admitting: Emergency Medicine

## 2014-02-17 DIAGNOSIS — R0789 Other chest pain: Secondary | ICD-10-CM | POA: Insufficient documentation

## 2014-02-17 DIAGNOSIS — Z87891 Personal history of nicotine dependence: Secondary | ICD-10-CM | POA: Insufficient documentation

## 2014-02-17 MED ORDER — HYDROCODONE-ACETAMINOPHEN 5-325 MG PO TABS
2.0000 | ORAL_TABLET | Freq: Once | ORAL | Status: AC
  Administered 2014-02-17: 2 via ORAL
  Filled 2014-02-17: qty 2

## 2014-02-17 MED ORDER — HYDROCODONE-ACETAMINOPHEN 5-325 MG PO TABS: 2.0000 | ORAL_TABLET | ORAL | Status: DC | PRN

## 2014-02-17 MED ORDER — IBUPROFEN 800 MG PO TABS: 800.0000 mg | ORAL_TABLET | Freq: Three times a day (TID) | ORAL | Status: DC

## 2014-02-17 NOTE — ED Provider Notes (Signed)
CSN: [id number]     Arrival date & time 02/17/14  1506 History   First MD Initiated Contact with Patient 02/17/14 1708     Chief Complaint  Patient presents with  . Chest Pain     (Consider location/radiation/quality/duration/timing/severity/associated sxs/prior Treatment) Patient is a 28 y.o. male presenting with chest pain. The history is provided by the patient. No language interpreter was used.  Chest Pain Pain location:  L chest Pain quality: aching   Pain radiates to:  Does not radiate Pain radiates to the back: no   Pain severity:  Moderate Timing:  Constant Progression:  Worsening Context: movement   Relieved by:  Nothing Worsened by:  Nothing tried Ineffective treatments:  None tried Risk factors: no high cholesterol and no hypertension   Pt reports heavy lifting at work  History reviewed. No pertinent past medical history. History reviewed. No pertinent past surgical history. Family History  Problem Relation Age of Onset  . Hyperlipidemia Mother   . Diabetes Mother   . Stroke Mother   . Hypertension Mother   . Hypertension Father    History  Substance Use Topics  . Smoking status: Former Games developer  . Smokeless tobacco: Never Used     Comment: quit in January  . Alcohol Use: No    Review of Systems  Cardiovascular: Positive for chest pain.  All other systems reviewed and are negative.     Allergies  Review of patient's allergies indicates no known allergies.  Home Medications   Prior to Admission medications   Medication Sig Start Date End Date Taking? Authorizing Provider  acetaminophen (TYLENOL) 500 MG tablet Take 1,000 mg by mouth every 6 (six) hours as needed for headache (headache).   Yes Historical Provider, MD  ibuprofen (ADVIL,MOTRIN) 600 MG tablet Take 1 tablet (600 mg total) by mouth every 6 (six) hours as needed. 02/14/14  Yes Shon Baton, MD   BP 142/94  Pulse 84  Temp(Src) 98.2 F (36.8 C) (Oral)  Resp 18  Ht 5\' 6"  (1.676 m)   Wt 230 lb (104.327 kg)  BMI 37.14 kg/m2  SpO2 100% Physical Exam  Nursing note and vitals reviewed. Constitutional: He is oriented to person, place, and time. He appears well-developed and well-nourished.  HENT:  Head: Normocephalic.  Right Ear: External ear normal.  Left Ear: External ear normal.  Nose: Nose normal.  Mouth/Throat: Oropharynx is clear and moist.  Eyes: EOM are normal. Pupils are equal, round, and reactive to light.  Neck: Normal range of motion.  Cardiovascular: Normal rate and normal heart sounds.   Pulmonary/Chest: Effort normal and breath sounds normal.  Abdominal: Soft. He exhibits no distension.  Musculoskeletal: Normal range of motion.  Neurological: He is alert and oriented to person, place, and time.  Skin: Skin is warm.  Psychiatric: He has a normal mood and affect.    ED Course  Procedures (including critical care time) Labs Review Labs Reviewed - No data to display  Imaging Review No results found.   EKG Interpretation   Date/Time:  Saturday February 17 2014 15:19:26 EDT Ventricular Rate:  79 PR Interval:  168 QRS Duration: 86 QT Interval:  402 QTC Calculation: 460 R Axis:   -5 Text Interpretation:  Normal sinus rhythm with sinus arrhythmia Minimal  voltage criteria for LVH, may be normal variant Nonspecific ST and T wave  abnormality Abnormal ECG Artifact Confirmed by Manus Gunning  MD, STEPHEN  ([id number]) on 02/17/2014 3:33:30 PM      MDM Chest  xray is normal.  EKG no acute.   Pt has pain with movement.   I advised probable muscle strain    Final diagnoses:  Chest wall pain   Hydrocodone ibuprofen     Elson AreasLeslie K Lasharn Bufkin, PA-C 02/17/14 1826

## 2014-02-17 NOTE — ED Notes (Addendum)
Pt reports chest pain x 1 week- has been evaluated at [facility] for same pain on wed- states today he was walking and his chest felt tight and the pain went to his left shoulder

## 2014-02-17 NOTE — Discharge Instructions (Signed)

## 2014-02-18 NOTE — ED Provider Notes (Signed)
History/physical exam/procedure(s) were performed by non-physician practitioner and as supervising physician I was immediately available for consultation/collaboration. I have reviewed all notes and am in agreement with care and plan.   Hilario Quarry, MD 02/18/14 Marlyne Beards

## 2014-02-21 ENCOUNTER — Emergency Department (HOSPITAL_COMMUNITY)
Admission: EM | Admit: 2014-02-21 | Discharge: 2014-02-21 | Disposition: A | Discharge status: Home / Self Care | Payer: Self-pay | Attending: Emergency Medicine | Admitting: Emergency Medicine

## 2014-02-21 ENCOUNTER — Encounter (HOSPITAL_COMMUNITY): Payer: Self-pay | Admitting: Emergency Medicine

## 2014-02-21 DIAGNOSIS — R0600 Dyspnea, unspecified: Secondary | ICD-10-CM | POA: Insufficient documentation

## 2014-02-21 DIAGNOSIS — Z791 Long term (current) use of non-steroidal anti-inflammatories (NSAID): Secondary | ICD-10-CM | POA: Insufficient documentation

## 2014-02-21 DIAGNOSIS — Z87891 Personal history of nicotine dependence: Secondary | ICD-10-CM | POA: Insufficient documentation

## 2014-02-21 DIAGNOSIS — R0789 Other chest pain: Secondary | ICD-10-CM | POA: Insufficient documentation

## 2014-02-21 LAB — I-STAT TROPONIN, ED: TROPONIN I, POC: 0 ng/mL (ref 0.00–0.08)

## 2014-02-21 LAB — I-STAT CHEM 8, ED
BUN: 14 mg/dL (ref 6–23)
Calcium, Ion: 1.14 mmol/L (ref 1.12–1.23)
Chloride: 107 mEq/L (ref 96–112)
Creatinine, Ser: 2.3 mg/dL — ABNORMAL HIGH (ref 0.50–1.35)
GLUCOSE: 101 mg/dL — AB (ref 70–99)
HEMATOCRIT: 40 % (ref 39.0–52.0)
HEMOGLOBIN: 13.6 g/dL (ref 13.0–17.0)
POTASSIUM: 3.1 meq/L — AB (ref 3.7–5.3)
Sodium: 144 mEq/L (ref 137–147)
TCO2: 23 mmol/L (ref 0–100)

## 2014-02-21 MED ORDER — DEXAMETHASONE 4 MG PO TABS
12.0000 mg | ORAL_TABLET | Freq: Once | ORAL | Status: AC
  Administered 2014-02-21: 12 mg via ORAL
  Filled 2014-02-21: qty 3

## 2014-02-21 NOTE — ED Notes (Signed)
Pt to ED via [facility] for evaluation of chest wall pain that started last Wednesday- pt has been seen at [facility] facility for the same.  Pt admits to palpitations tonight.  NSR on monitor.

## 2014-02-21 NOTE — Discharge Instructions (Signed)

## 2014-02-21 NOTE — ED Provider Notes (Signed)
CSN: [id number]     Arrival date & time 02/21/14  0029 History   First MD Initiated Contact with Patient 02/21/14 0107     Chief Complaint  Patient presents with  . Chest Pain     (Consider location/radiation/quality/duration/timing/severity/associated sxs/prior Treatment) Patient is a 28 y.o. male presenting with chest pain. The history is provided by the patient.  Chest Pain He was at work when he noted a tight feeling in his chest. This is associated with dyspnea. He walked outside and symptoms gradually resolved over about 10 minutes. He feels like he is back to his baseline. There was no cough and no nausea or vomiting and no diaphoresis. He has been evaluated in the ED several times for chest pain but this is different from the pain that he had been evaluated for previously. He has never had any episodes like this before. While present, nothing made the tightness better and nothing made it worse. He rated at 6/10. He is a former smoker having quit 8 months ago. He denies history of diabetes, hypertension, hyperlipidemia and denies family history of premature coronary artery disease.  History reviewed. No pertinent past medical history. History reviewed. No pertinent past surgical history. Family History  Problem Relation Age of Onset  . Hyperlipidemia Mother   . Diabetes Mother   . Stroke Mother   . Hypertension Mother   . Hypertension Father    History  Substance Use Topics  . Smoking status: Former Games developermoker  . Smokeless tobacco: Never Used     Comment: quit in January  . Alcohol Use: No    Review of Systems  Cardiovascular: Positive for chest pain.  All other systems reviewed and are negative.     Allergies  Review of patient's allergies indicates no known allergies.  Home Medications   Prior to Admission medications   Medication Sig Start Date End Date Taking? Authorizing Provider  acetaminophen (TYLENOL) 500 MG tablet Take 1,000 mg by mouth every 6 (six) hours  as needed for headache (headache).   Yes Historical Provider, MD  ibuprofen (ADVIL,MOTRIN) 800 MG tablet Take 1 tablet (800 mg total) by mouth 3 (three) times daily. 02/17/14  Yes Elson AreasLeslie K Sofia, PA-C  HYDROcodone-acetaminophen (NORCO/VICODIN) 5-325 MG per tablet Take 2 tablets by mouth every 4 (four) hours as needed. 02/17/14   Elson AreasLeslie K Sofia, PA-C   BP 127/85  Pulse 67  Temp(Src) 98.3 F (36.8 C) (Oral)  Resp 15  SpO2 100% Physical Exam  Nursing note and vitals reviewed.  28 year old male, resting comfortably and in no acute distress. Vital signs are normal. Oxygen saturation is 100%, which is normal. Head is normocephalic and atraumatic. PERRLA, EOMI. Oropharynx is clear. Neck is nontender and supple without adenopathy or JVD. Back is nontender and there is no CVA tenderness. Lungs are clear without rales, wheezes, or rhonchi. Chest is nontender. Heart has regular rate and rhythm without murmur. Abdomen is soft, flat, nontender without masses or hepatosplenomegaly and peristalsis is normoactive. Extremities have no cyanosis or edema, full range of motion is present. Skin is warm and dry without rash. Neurologic: Mental status is normal, cranial nerves are intact, there are no motor or sensory deficits.  ED Course  Procedures (including critical care time) Labs Review Results for orders placed during the hospital encounter of 02/21/14  I-STAT CHEM 8, ED      Result Value Ref Range   Sodium 144  137 - 147 mEq/L   Potassium 3.1 (*) 3.7 -  5.3 mEq/L   Chloride 107  96 - 112 mEq/L   BUN 14  6 - 23 mg/dL   Creatinine, Ser 9.03 (*) 0.50 - 1.35 mg/dL   Glucose, Bld 833 (*) 70 - 99 mg/dL   Calcium, Ion 3.83  2.91 - 1.23 mmol/L   TCO2 23  0 - 100 mmol/L   Hemoglobin 13.6  13.0 - 17.0 g/dL   HCT 91.6  60.6 - 00.4 %  I-STAT TROPOININ, ED      Result Value Ref Range   Troponin i, poc 0.00  0.00 - 0.08 ng/mL   Comment 3             EKG Interpretation   Date/Time:  Wednesday  February 21 2014 00:40:39 EDT Ventricular Rate:  74 PR Interval:  170 QRS Duration: 84 QT Interval:  374 QTC Calculation: 415 R Axis:   6 Text Interpretation:  Normal sinus rhythm with sinus arrhythmia Left  ventricular hypertrophy Abnormal ECG Early repolarization When compared  with ECG of 02/17/2014, No significant change was found Confirmed by [facility]   MD, Berthel Bagnall ([id number]) on 02/21/2014 1:29:26 AM      MDM   Final diagnoses:  Chest tightness    Chest tightness which was transient and hasn't now completely resolved. I suspect this was an episode of bronchospasm. He'll be given a single dose of dexamethasone and discharged. He states that he has an appointment to see a new PCP on October 30. Old records are reviewed confirming several ED visits for chest wall pain.    Dione Booze, MD 02/21/14 [phone]

## 2014-02-22 ENCOUNTER — Ambulatory Visit (INDEPENDENT_AMBULATORY_CARE_PROVIDER_SITE_OTHER): Payer: Self-pay | Admitting: Family Medicine

## 2014-02-22 VITALS — BP 134/90 | HR 73 | Temp 98.1°F | Resp 16 | Ht 67.0 in | Wt 224.2 lb

## 2014-02-22 DIAGNOSIS — F410 Panic disorder [episodic paroxysmal anxiety] without agoraphobia: Secondary | ICD-10-CM

## 2014-02-22 DIAGNOSIS — R079 Chest pain, unspecified: Secondary | ICD-10-CM

## 2014-02-22 MED ORDER — TRAZODONE HCL 50 MG PO TABS: 25.0000 mg | ORAL_TABLET | Freq: Every evening | ORAL | Status: DC | PRN

## 2014-02-22 MED ORDER — ALPRAZOLAM 0.25 MG PO TABS: 0.2500 mg | ORAL_TABLET | Freq: Every day | ORAL | Status: DC | PRN

## 2014-02-22 NOTE — Patient Instructions (Signed)

## 2014-02-22 NOTE — Progress Notes (Signed)
° °  Subjective:    Patient ID: Derek Reed, male    DOB: 1985/12/30, 28 y.o.   MRN: [id number] This chart was scribed for Elvina Sidle, MD by Littie Deeds, Medical Scribe. This patient was seen in Room 12 and the patient's care was started at 3:37 PM.   HPI HPI Comments: Derek Reed is a 28 y.o. male who presents to the [facility] complaining of a possible gradual onset anxiety attack that occurred 2 days ago. Patient was at work and he experienced an episode of chest tightness and SOB that lasted a few minutes. He was taken to the ER; they did an EKG with normal results and he was given 3 pills, but no XRs. Patient had 3 similar episodes the past week, but has never experienced similar episodes before that. He denies being under stress and hx of asthma. 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 Patient denies smoking and drug use. Patient has not been sleeping very well. He has FMHx of DM, CVA, MI (grandfather) and HTN. Patient notes only exercising occasionally.  He also notes his eyes have been twitching.    Review of Systems  Constitutional: Negative for appetite change and fatigue.  HENT: Negative for congestion, ear discharge and sinus pressure.   Eyes: Negative for discharge.  Respiratory: Positive for chest tightness and shortness of breath. Negative for cough.   Cardiovascular: Negative for chest pain.  Gastrointestinal: Negative for abdominal pain and diarrhea.  Genitourinary: Negative for frequency and hematuria.  Musculoskeletal: Negative for back pain.  Skin: Negative for rash.  Neurological: Negative for seizures.  Psychiatric/Behavioral: Positive for sleep disturbance. Negative for hallucinations. The patient is nervous/anxious.        Objective:   Physical Exam CONSTITUTIONAL: Well developed/well nourished HEAD: Normocephalic/atraumatic EYES: EOM/PERRL ENMT: Mucous membranes moist NECK: supple no  meningeal signs SPINE: entire spine nontender CV: S1/S2 noted, no murmurs/rubs/gallops noted LUNGS: Lungs are clear to auscultation bilaterally, no apparent distress ABDOMEN: soft, nontender, no rebound or guarding GU: no cva tenderness NEURO: Pt is awake/alert, moves all extremitiesx4 EXTREMITIES: pulses normal, full ROM SKIN: warm, color normal PSYCH: no abnormalities of mood noted  Chest x-ray review-normal EKG review-inferolateral lead ST inversion      Assessment & Plan:     Panic disorder - Plan: traZODone (DESYREL) 50 MG tablet, ALPRAZolam (XANAX) 0.25 MG tablet  Chest pain, unspecified chest pain type  Elvina Sidle, MD

## 2014-03-05 ENCOUNTER — Other Ambulatory Visit: Payer: Self-pay | Admitting: Family Medicine

## 2014-03-06 NOTE — Telephone Encounter (Signed)
Faxed

## 2014-03-08 ENCOUNTER — Ambulatory Visit: Payer: Self-pay | Admitting: Cardiovascular Disease

## 2014-03-18 ENCOUNTER — Emergency Department (HOSPITAL_COMMUNITY): Payer: Worker's Compensation

## 2014-03-18 ENCOUNTER — Emergency Department (HOSPITAL_COMMUNITY)
Admission: EM | Admit: 2014-03-18 | Discharge: 2014-03-18 | Disposition: A | Discharge status: Home / Self Care | Payer: Worker's Compensation | Attending: Emergency Medicine | Admitting: Emergency Medicine

## 2014-03-18 ENCOUNTER — Encounter (HOSPITAL_COMMUNITY): Payer: Self-pay | Admitting: Emergency Medicine

## 2014-03-18 DIAGNOSIS — S0121XA Laceration without foreign body of nose, initial encounter: Secondary | ICD-10-CM | POA: Diagnosis not present

## 2014-03-18 DIAGNOSIS — Z87891 Personal history of nicotine dependence: Secondary | ICD-10-CM | POA: Diagnosis not present

## 2014-03-18 DIAGNOSIS — Y9289 Other specified places as the place of occurrence of the external cause: Secondary | ICD-10-CM | POA: Diagnosis not present

## 2014-03-18 DIAGNOSIS — S0990XA Unspecified injury of head, initial encounter: Secondary | ICD-10-CM | POA: Insufficient documentation

## 2014-03-18 DIAGNOSIS — Y9389 Activity, other specified: Secondary | ICD-10-CM | POA: Diagnosis not present

## 2014-03-18 DIAGNOSIS — M25519 Pain in unspecified shoulder: Secondary | ICD-10-CM

## 2014-03-18 DIAGNOSIS — W01198A Fall on same level from slipping, tripping and stumbling with subsequent striking against other object, initial encounter: Secondary | ICD-10-CM | POA: Insufficient documentation

## 2014-03-18 DIAGNOSIS — S20302A Unspecified superficial injuries of left front wall of thorax, initial encounter: Secondary | ICD-10-CM | POA: Insufficient documentation

## 2014-03-18 DIAGNOSIS — R0781 Pleurodynia: Secondary | ICD-10-CM

## 2014-03-18 DIAGNOSIS — Y998 Other external cause status: Secondary | ICD-10-CM | POA: Diagnosis not present

## 2014-03-18 DIAGNOSIS — S4992XA Unspecified injury of left shoulder and upper arm, initial encounter: Secondary | ICD-10-CM | POA: Diagnosis present

## 2014-03-18 DIAGNOSIS — Z791 Long term (current) use of non-steroidal anti-inflammatories (NSAID): Secondary | ICD-10-CM | POA: Diagnosis not present

## 2014-03-18 MED ORDER — NAPROXEN 375 MG PO TABS: 375.0000 mg | ORAL_TABLET | Freq: Two times a day (BID) | ORAL | Status: DC

## 2014-03-18 MED ORDER — TRAMADOL HCL 50 MG PO TABS
50.0000 mg | ORAL_TABLET | Freq: Once | ORAL | Status: AC
  Administered 2014-03-18: 50 mg via ORAL
  Filled 2014-03-18: qty 1

## 2014-03-18 MED ORDER — CYCLOBENZAPRINE HCL 10 MG PO TABS: 10.0000 mg | ORAL_TABLET | Freq: Two times a day (BID) | ORAL | Status: DC | PRN

## 2014-03-18 MED ORDER — HYDROCODONE-ACETAMINOPHEN 5-325 MG PO TABS: 1.0000 | ORAL_TABLET | ORAL | Status: DC | PRN

## 2014-03-18 NOTE — Discharge Instructions (Signed)
Acromioclavicular Injuries °The AC (acromioclavicular) joint is the joint in the shoulder where the collarbone (clavicle) meets the shoulder blade (scapula). The part of the shoulder blade connected to the collarbone is called the acromion. Common problems with and treatments for the AC joint are detailed below. °ARTHRITIS °Arthritis occurs when the joint has been injured and the smooth padding between the joints (cartilage) is lost. This is the wear and tear seen in most joints of the body if they have been overused. This causes the joint to produce pain and swelling which is worse with activity.  °AC JOINT SEPARATION °AC joint separation means that the ligaments connecting the acromion of the shoulder blade and collarbone have been damaged, and the two bones no longer line up. AC separations can be anywhere from mild to severe, and are "graded" depending upon which ligaments are torn and how badly they are torn. °· Grade I Injury: the least damage is done, and the AC joint still lines up. °· Grade II Injury: damage to the ligaments which reinforce the AC joint. In a Grade II injury, these ligaments are stretched but not entirely torn. When stressed, the AC joint becomes painful and unstable. °· Grade III Injury: AC and secondary ligaments are completely torn, and the collarbone is no longer attached to the shoulder blade. This results in deformity; a prominence of the end of the clavicle. °AC JOINT FRACTURE °AC joint fracture means that there has been a break in the bones of the AC joint, usually the end of the clavicle. °TREATMENT °TREATMENT OF AC ARTHRITIS °· There is currently no way to replace the cartilage damaged by arthritis. The best way to improve the condition is to decrease the activities which aggravate the problem. Application of ice to the joint helps decrease pain and soreness (inflammation). The use of non-steroidal anti-inflammatory medication is helpful. °· If less conservative measures do not  work, then cortisone shots (injections) may be used. These are anti-inflammatories; they decrease the soreness in the joint and swelling. °· If non-surgical measures fail, surgery may be recommended. The procedure is generally removal of a portion of the end of the clavicle. This is the part of the collarbone closest to your acromion which is stabilized with ligaments to the acromion of the shoulder blade. This surgery may be performed using a tube-like instrument with a light (arthroscope) for looking into a joint. It may also be performed as an open surgery through a small incision by the surgeon. Most patients will have good range of motion within 6 weeks and may return to all activity including sports by 8-12 weeks, barring complications. °TREATMENT OF AN AC SEPARATION °· The initial treatment is to decrease pain. This is best accomplished by immobilizing the arm in a sling and placing an ice pack to the shoulder for 20 to 30 minutes every 2 hours as needed. As the pain starts to subside, it is important to begin moving the fingers, wrist, elbow and eventually the shoulder in order to prevent a stiff or "frozen" shoulder. Instruction on when and how much to move the shoulder will be provided by your caregiver. The length of time needed to regain full motion and function depends on the amount or grade of the injury. Recovery from a Grade I AC separation usually takes 10 to 14 days, whereas a Grade III may take 6 to 8 weeks. °· Grade I and II separations usually do not require surgery. Even Grade III injuries usually allow return to full   activity with few restrictions. Treatment is also based on the activity demands of the injured shoulder. For example, a high level quarterback with an injured throwing arm will receive more aggressive treatment than someone with a desk job who rarely uses his/her arm for strenuous activities. In some cases, a painful lump may persist which could require a later surgery. Surgery  can be very successful, but the benefits must be weighed against the potential risks. TREATMENT OF AN AC JOINT FRACTURE Fracture treatment depends on the type of fracture. Sometimes a splint or sling may be all that is required. Other times surgery may be required for repair. This is more frequently the case when the ligaments supporting the clavicle are completely torn. Your caregiver will help you with these decisions and together you can decide what will be the best treatment. HOME CARE INSTRUCTIONS   Apply ice to the injury for 15-20 minutes each hour while awake for 2 days. Put the ice in a plastic bag and place a towel between the bag of ice and skin.  If a sling has been applied, wear it constantly for as long as directed by your caregiver, even at night. The sling or splint can be removed for bathing or showering or as directed. Be sure to keep the shoulder in the same place as when the sling is on. Do not lift the arm.  If a figure-of-eight splint has been applied it should be tightened gently by another person every day. Tighten it enough to keep the shoulders held back. Allow enough room to place the index finger between the body and strap. Loosen the splint immediately if there is numbness or tingling in the hands.  Take over-the-counter or prescription medicines for pain, discomfort or fever as directed by your caregiver.  If you or your child has received a follow up appointment, it is very important to keep that appointment in order to avoid long term complications, chronic pain or disability. SEEK MEDICAL CARE IF:   The pain is not relieved with medications.  There is increased swelling or discoloration that continues to get worse rather than better.  You or your child has been unable to follow up as instructed.  There is progressive numbness and tingling in the arm, forearm or hand. SEEK IMMEDIATE MEDICAL CARE IF:   The arm is numb, cold or pale.  There is increasing pain  in the hand, forearm or fingers. MAKE SURE YOU:   Understand these instructions.  Will watch your condition.  Will get help right away if you are not doing well or get worse. Document Released: 02/04/2005 Document Revised: 07/20/2011 Document Reviewed: 07/30/2008 [facility] Patient Information 2015 [place], [place]. This information is not intended to replace advice given to you by your health care provider. Make sure you discuss any questions you have with your health care provider. Facial Laceration  A facial laceration is a cut on the face. These injuries can be painful and cause bleeding. Lacerations usually heal quickly, but they need special care to reduce scarring. DIAGNOSIS  Your health care provider will take a medical history, ask for details about how the injury occurred, and examine the wound to determine how deep the cut is. TREATMENT  Some facial lacerations may not require closure. Others may not be able to be closed because of an increased risk of infection. The risk of infection and the chance for successful closure will depend on various factors, including the amount of time since the injury occurred. The  wound may be cleaned to help prevent infection. If closure is appropriate, pain medicines may be given if needed. Your health care provider will use stitches (sutures), wound glue (adhesive), or skin adhesive strips to repair the laceration. These tools bring the skin edges together to allow for faster healing and a better cosmetic outcome. If needed, you may also be given a tetanus shot. HOME CARE INSTRUCTIONS  Only take over-the-counter or prescription medicines as directed by your health care provider.  Follow your health care provider's instructions for wound care. These instructions will vary depending on the technique used for closing the wound. For Sutures:  Keep the wound clean and dry.   If you were given a bandage (dressing), you should change it at least once a  day. Also change the dressing if it becomes wet or dirty, or as directed by your health care provider.   Wash the wound with soap and water 2 times a day. Rinse the wound off with water to remove all soap. Pat the wound dry with a clean towel.   After cleaning, apply a thin layer of the antibiotic ointment recommended by your health care provider. This will help prevent infection and keep the dressing from sticking.   You may shower as usual after the first 24 hours. Do not soak the wound in water until the sutures are removed.   Get your sutures removed as directed by your health care provider. With facial lacerations, sutures should usually be taken out after 4-5 days to avoid stitch marks.   Wait a few days after your sutures are removed before applying any makeup. For Skin Adhesive Strips:  Keep the wound clean and dry.   Do not get the skin adhesive strips wet. You may bathe carefully, using caution to keep the wound dry.   If the wound gets wet, pat it dry with a clean towel.   Skin adhesive strips will fall off on their own. You may trim the strips as the wound heals. Do not remove skin adhesive strips that are still stuck to the wound. They will fall off in time.  For Wound Adhesive:  You may briefly wet your wound in the shower or bath. Do not soak or scrub the wound. Do not swim. Avoid periods of heavy sweating until the skin adhesive has fallen off on its own. After showering or bathing, gently pat the wound dry with a clean towel.   Do not apply liquid medicine, cream medicine, ointment medicine, or makeup to your wound while the skin adhesive is in place. This may loosen the film before your wound is healed.   If a dressing is placed over the wound, be careful not to apply tape directly over the skin adhesive. This may cause the adhesive to be pulled off before the wound is healed.   Avoid prolonged exposure to sunlight or tanning lamps while the skin adhesive is  in place.  The skin adhesive will usually remain in place for 5-10 days, then naturally fall off the skin. Do not pick at the adhesive film.  After Healing: Once the wound has healed, cover the wound with sunscreen during the day for 1 full year. This can help minimize scarring. Exposure to ultraviolet light in the first year will darken the scar. It can take 1-2 years for the scar to lose its redness and to heal completely.  SEEK IMMEDIATE MEDICAL CARE IF:  You have redness, pain, or swelling around the wound.  You see ayellowish-white fluid (pus) coming from the wound.   You have chills or a fever.  MAKE SURE YOU:  Understand these instructions.  Will watch your condition.  Will get help right away if you are not doing well or get worse. Document Released: 06/04/2004 Document Revised: 02/15/2013 Document Reviewed: 12/08/2012 [facility] Patient Information 2015 [place], [place]. This information is not intended to replace advice given to you by your health care provider. Make sure you discuss any questions you have with your health care provider.

## 2014-03-18 NOTE — ED Notes (Signed)
Pt states that he fell yesterday at work after slipping on some water, hitting his face, L shoulder and side. No loc. Now c/o pain in those areas. Lac on bridge of nose. Bleeding controlled. Alert and oriented.

## 2014-03-18 NOTE — ED Provider Notes (Signed)
CSN: [id number]     Arrival date & time 03/18/14  1517 History  This chart was scribed for Marlon Pel, PA-C, working with Linwood Dibbles, MD by Chestine Spore, ED Scribe. The patient was seen in room WTR5/WTR5 at 5:03 PM.    Chief Complaint  Patient presents with  . Fall     The history is provided by the patient. No language interpreter was used.   HPI Comments: Derek Reed is a 28 y.o. male who presents to the Emergency Department complaining of fall onset yesterday morning. He states that he fell at work. He states that he hit his nose on a metal rack. He states that last night his nose and cheek were sore. He states that last night it was hard for him to breathe out his nose and his side was hurting. He states that his breathing out his nose is not as bad and much better today. He states that he is having associated symptoms of laceration to the nose, L shoulder pain, L rib pain. He states that he took two ibuprofen with mild relief for his symptoms. He denies LOC, neck pain, and any other symptoms. He states that he works at [employer].  History reviewed. No pertinent past medical history. History reviewed. No pertinent past surgical history. Family History  Problem Relation Age of Onset  . Hyperlipidemia Mother   . Diabetes Mother   . Stroke Mother   . Hypertension Mother   . Hypertension Father    History  Substance Use Topics  . Smoking status: Former Games developer  . Smokeless tobacco: Never Used     Comment: quit in January  . Alcohol Use: No    Review of Systems  Eyes: Positive for visual disturbance.  Musculoskeletal: Positive for arthralgias (Left shoulder and Left side).  Skin: Positive for wound (laceratio to nose).  Neurological: Positive for headaches. Negative for syncope.  All other systems reviewed and are negative.     Allergies  Review of patient's allergies indicates no known allergies.  Home Medications   Prior to Admission medications   Medication Sig  Start Date End Date Taking? Authorizing Provider  ibuprofen (ADVIL,MOTRIN) 200 MG tablet Take 400 mg by mouth every 6 (six) hours as needed for headache (headache).   Yes Historical Provider, MD  traZODone (DESYREL) 50 MG tablet Take 0.5-1 tablets (25-50 mg total) by mouth at bedtime as needed for sleep. 02/22/14  Yes Elvina Sidle, MD  acetaminophen (TYLENOL) 500 MG tablet Take 1,000 mg by mouth every 6 (six) hours as needed for headache.     Historical Provider, MD  ALPRAZolam Prudy Feeler) 0.25 MG tablet TAKE 1 TABLET BY MOUTH DAILY AS NEEDED FOR ANXIETY 03/06/14   Elvina Sidle, MD  ALPRAZolam Prudy Feeler) 0.25 MG tablet Take 0.25 mg by mouth daily as needed for anxiety.    Historical Provider, MD  ibuprofen (ADVIL,MOTRIN) 800 MG tablet Take 1 tablet (800 mg total) by mouth 3 (three) times daily. 02/17/14   Lonia Skinner Sofia, PA-C   BP 139/91 mmHg  Pulse 81  Temp(Src) 98.3 F (36.8 C) (Oral)  Resp 20  SpO2 99%  Physical Exam  Constitutional: He is oriented to person, place, and time. He appears well-developed and well-nourished. No distress.  HENT:  Head: Normocephalic and atraumatic. Head is without raccoon's eyes, without Battle's sign and without right periorbital erythema.  Right Ear: Tympanic membrane and ear canal normal.  Left Ear: Tympanic membrane normal.  Nose: Nose lacerations and sinus tenderness present.  No rhinorrhea, nasal deformity, septal deviation or nasal septal hematoma. No epistaxis.  No foreign bodies. Right sinus exhibits no maxillary sinus tenderness and no frontal sinus tenderness. Left sinus exhibits no maxillary sinus tenderness and no frontal sinus tenderness.    Eyes: EOM are normal.  Neck: Neck supple. No spinous process tenderness and no muscular tenderness present. No tracheal deviation present.  Cardiovascular: Normal rate.   Pulmonary/Chest: Effort normal. No respiratory distress. He exhibits tenderness and bony tenderness.    Musculoskeletal: Normal range of  motion.       Left shoulder: He exhibits tenderness and pain. He exhibits normal range of motion, no bony tenderness, no swelling, no effusion, no crepitus, no deformity, no laceration, no spasm, normal pulse and normal strength.  Neurological: He is alert and oriented to person, place, and time.  Cranial nerves II-VIII and X-XII evaluated and show no deficits. Pt alert and oriented x 3 Upper and lower extremity strength is symmetrical and physiologic Normal muscular tone No facial droop Coordination intact, no limb ataxia, finger-nose-finger normal Rapid alternating movements normal No pronator drift  Skin: Skin is warm and dry. Laceration noted.  Psychiatric: He has a normal mood and affect. His behavior is normal.  Nursing note and vitals reviewed.   ED Course  Procedures (including critical care time) DIAGNOSTIC STUDIES: Oxygen Saturation is 99% on room air, normal by my interpretation.    COORDINATION OF CARE: 5:10 PM-Discussed treatment plan which includes X-Ray of Ribs with pt at bedside and pt agreed to plan.   Labs Review Labs Reviewed - No data to display  Imaging Review Dg Ribs Unilateral W/chest Left  03/18/2014   CLINICAL DATA:  Derek Reed is a 28 y.o. male who presents to the Emergency Department complaining of fall onset yesterday morning. He states that he fell at work. He states that he hit his head on a metal rack. He states that last night his nose and eye was sore. He states that last night it was hard for him to breathe out his nose and his side was hurting. He states that his breathing out his nose is not as bad. He states that he is having associated symptoms of laceration to the nose, L shoulder pain, L side pain, Right HA, double vision on the left side.Pt c/o CP just Lt of sternal area for the past 3 weeks, pain on Lt lateral side of ribs, BB marked where most of the pain is. Pt also c/o Lt humerus pain with Lt fingers tingling at times.  EXAM: LEFT RIBS AND  CHEST - 3+ VIEW  COMPARISON:  02/17/2014 and 02/14/2014  FINDINGS: Lungs are adequately inflated and otherwise clear. Cardiomediastinal silhouette is within normal. No evidence of rib fracture.  IMPRESSION: Negative.   Electronically Signed   By: Elberta Fortisaniel  Boyle M.D.   On: 03/18/2014 17:56     EKG Interpretation None      MDM   Final diagnoses:  Rib pain  Shoulder pain   The patients xrays have resulted and are reassuring. His xrays did not show any acute lung or bony findings. Pt and I discussed CT max/fac vs no CT max/fac- base on his physical exam I did not feel that it was indicated. He has no septal deviation, raccoons eyes, battles signs, orbital swelling. His tenderness to mild- no deformity. If there is a fracture then it would most likely be very small.  Referral to ORtho, Reed and home medications for treatment. The patient denies any  symptoms of neurological impairment or TIA's; no amaurosis, diplopia, dysphasia, or unilateral disturbance of motor or sensory function. No loss of balance or vertigo.   28 y.o.Lonzell Blank's evaluation in the Emergency Department is complete. It has been determined that no acute conditions requiring further emergency intervention are present at this time. The patient/guardian have been advised of the diagnosis and plan. We have discussed signs and symptoms that warrant return to the ED, such as changes or worsening in symptoms.  Vital signs are stable at discharge. Filed Vitals:   03/18/14 1532  BP: 139/91  Pulse: 81  Temp: 98.3 F (36.8 C)  Resp: 20    Patient/guardian has voiced understanding and agreed to follow-up with the PCP or specialist.   I personally performed the services described in this documentation, which was scribed in my presence. The recorded information has been reviewed and is accurate.    Dorthula Matas, PA-C 03/18/14 1842  Linwood Dibbles, MD 03/18/14 [phone]

## 2014-03-23 ENCOUNTER — Emergency Department (HOSPITAL_COMMUNITY)
Admission: EM | Admit: 2014-03-23 | Discharge: 2014-03-23 | Disposition: A | Discharge status: Home / Self Care | Payer: Worker's Compensation | Attending: Emergency Medicine | Admitting: Emergency Medicine

## 2014-03-23 ENCOUNTER — Encounter (HOSPITAL_COMMUNITY): Payer: Self-pay | Admitting: *Deleted

## 2014-03-23 DIAGNOSIS — R531 Weakness: Secondary | ICD-10-CM | POA: Diagnosis present

## 2014-03-23 DIAGNOSIS — R42 Dizziness and giddiness: Secondary | ICD-10-CM | POA: Diagnosis not present

## 2014-03-23 DIAGNOSIS — I10 Essential (primary) hypertension: Secondary | ICD-10-CM | POA: Diagnosis not present

## 2014-03-23 DIAGNOSIS — E119 Type 2 diabetes mellitus without complications: Secondary | ICD-10-CM | POA: Insufficient documentation

## 2014-03-23 DIAGNOSIS — Z791 Long term (current) use of non-steroidal anti-inflammatories (NSAID): Secondary | ICD-10-CM | POA: Diagnosis not present

## 2014-03-23 DIAGNOSIS — Z87891 Personal history of nicotine dependence: Secondary | ICD-10-CM | POA: Diagnosis not present

## 2014-03-23 NOTE — ED Provider Notes (Signed)
CSN: [id number]     Arrival date & time 03/23/14  0215 History   First MD Initiated Contact with Patient 03/23/14 [phone]     Chief Complaint  Patient presents with  . Weakness     (Consider location/radiation/quality/duration/timing/severity/associated sxs/prior Treatment) HPI  Derek Reed is a 28 y.o. male with past medical history of hypertension, diabetes, hyperlipidemia coming in with dizziness. Patient was recently seen here 2 days ago after falling. He was discharged with Flexeril. He took it last night with good relief. He took it again tonight at work approximately 2 hours ago. One hour later patient began feeling dizzy and weak. He states he feels like he was going to pass out and has bilateral lower extremities were weak as well. He denied any headache, chest pain, shortness of breath or abdominal pain at the time. Patient states she's never had these symptoms before. He's had a good appetite, no nausea vomiting or diarrhea. Patient has no further complaints.  10 Systems reviewed and are negative for acute change except as noted in the HPI.     History reviewed. No pertinent past medical history. History reviewed. No pertinent past surgical history. Family History  Problem Relation Age of Onset  . Hyperlipidemia Mother   . Diabetes Mother   . Stroke Mother   . Hypertension Mother   . Hypertension Father    History  Substance Use Topics  . Smoking status: Former Games developer  . Smokeless tobacco: Never Used     Comment: quit in January  . Alcohol Use: No    Review of Systems    Allergies  Review of patient's allergies indicates no known allergies.  Home Medications   Prior to Admission medications   Medication Sig Start Date End Date Taking? Authorizing Provider  acetaminophen (TYLENOL) 500 MG tablet Take 1,000 mg by mouth every 6 (six) hours as needed for headache.    Yes Historical Provider, MD  ALPRAZolam Prudy Feeler) 0.25 MG tablet Take 0.25 mg by mouth daily as  needed for anxiety.   Yes Historical Provider, MD  cyclobenzaprine (FLEXERIL) 10 MG tablet Take 1 tablet (10 mg total) by mouth 2 (two) times daily as needed for muscle spasms. 03/18/14  Yes Tiffany Irine Seal, PA-C  naproxen (NAPROSYN) 375 MG tablet Take 1 tablet (375 mg total) by mouth 2 (two) times daily. 03/18/14  Yes Tiffany Irine Seal, PA-C  ALPRAZolam Prudy Feeler) 0.25 MG tablet TAKE 1 TABLET BY MOUTH DAILY AS NEEDED FOR ANXIETY 03/06/14   Elvina Sidle, MD  HYDROcodone-acetaminophen (NORCO/VICODIN) 5-325 MG per tablet Take 1-2 tablets by mouth every 4 (four) hours as needed for moderate pain or severe pain. 03/18/14   Tiffany Irine Seal, PA-C  ibuprofen (ADVIL,MOTRIN) 200 MG tablet Take 400 mg by mouth every 6 (six) hours as needed for headache (headache).    Historical Provider, MD  ibuprofen (ADVIL,MOTRIN) 800 MG tablet Take 1 tablet (800 mg total) by mouth 3 (three) times daily. 02/17/14   Elson Areas, PA-C  traZODone (DESYREL) 50 MG tablet Take 0.5-1 tablets (25-50 mg total) by mouth at bedtime as needed for sleep. 02/22/14   Elvina Sidle, MD   BP 130/86 mmHg  Pulse 74  Temp(Src) 98 F (36.7 C)  Resp 16  Ht 5\' 6"  (1.676 m)  Wt 225 lb (102.059 kg)  BMI 36.33 kg/m2  SpO2 100% Physical Exam  Constitutional: He is oriented to person, place, and time. Vital signs are normal. He appears well-developed and well-nourished.  Non-toxic appearance. He  does not appear ill. No distress.  HENT:  Head: Normocephalic and atraumatic.  Nose: Nose normal.  Mouth/Throat: Oropharynx is clear and moist. No oropharyngeal exudate.  Eyes: Conjunctivae and EOM are normal. Pupils are equal, round, and reactive to light. No scleral icterus.  Neck: Normal range of motion. Neck supple. No tracheal deviation, no edema, no erythema and normal range of motion present. No thyroid mass and no thyromegaly present.  Cardiovascular: Normal rate, regular rhythm, S1 normal, S2 normal, normal heart sounds, intact distal  pulses and normal pulses.  Exam reveals no gallop and no friction rub.   No murmur heard. Pulses:      Radial pulses are 2+ on the right side, and 2+ on the left side.       Dorsalis pedis pulses are 2+ on the right side, and 2+ on the left side.  Pulmonary/Chest: Effort normal and breath sounds normal. No respiratory distress. He has no wheezes. He has no rhonchi. He has no rales.  Abdominal: Soft. Normal appearance and bowel sounds are normal. He exhibits no distension, no ascites and no mass. There is no hepatosplenomegaly. There is no tenderness. There is no rebound, no guarding and no CVA tenderness.  Musculoskeletal: Normal range of motion. He exhibits no edema or tenderness.  Lymphadenopathy:    He has no cervical adenopathy.  Neurological: He is alert and oriented to person, place, and time. He has normal strength. No cranial nerve deficit or sensory deficit. He exhibits normal muscle tone. GCS eye subscore is 4. GCS verbal subscore is 5. GCS motor subscore is 6.  Out of 5 strength 4 extremities, normal sensation 4 extremities, normal cerebellar testing.  Skin: Skin is warm, dry and intact. No petechiae and no rash noted. He is not diaphoretic. No erythema. No pallor.  Psychiatric: He has a normal mood and affect. His behavior is normal. Judgment normal.  Nursing note and vitals reviewed.   ED Course  Procedures (including critical care time) Labs Review Labs Reviewed - No data to display  Imaging Review No results found.   EKG Interpretation   Date/Time:  Friday March 23 2014 02:19:33 EST Ventricular Rate:  88 PR Interval:  181 QRS Duration: 76 QT Interval:  367 QTC Calculation: 444 R Axis:   19 Text Interpretation:  Incomplete analysis due to missing data in  precordial lead(s) Sinus rhythm Missing lead(s): V6 No significant change  since last tracing Confirmed by Erroll Lunani, Caelen Higinbotham Ayokunle [phone]([id number]) on  03/23/2014 3:36:01 AM      MDM   Final diagnoses:  None     Patient seen emergency department out of concern for weakness. This is in the setting of beginning Flexeril. Patient was advised that this is a side effect of the medication and this likely caused his symptoms. He tells me that he is going to stop taking this medication. I told him not to drive or work on the medicine but she can take it before bedtime if he needs to. He currently states his rib pain is improved. EKG does not reveal a cause for possible syncope. His vital signs remain within his normal limits and he is safe for discharge.    Tomasita CrumbleAdeleke Eldwin Volkov, MD 03/23/14 [phone]

## 2014-03-23 NOTE — ED Notes (Signed)
Pt to ED from work c/o dizziness and weakness. Denies chest pain. Reports standing at work when he began feeling lightheaded and faint. Pt eased self to the floor. Recently seen for L sided rib pain; took flexeril at 2030 for pain in L sided rib and L sided chest "tightness."

## 2014-03-23 NOTE — Discharge Instructions (Signed)
Dizziness  Derek Reed, you were seen today for dizziness. This is likely due to the Flexeril you are taking. Do not take this medication while driving or at work. Follow-up with your primary care physician within 3 days for continued treatment. If any symptoms worsen come back to emergency department for repeat evaluation. Thank you.  Dizziness means you feel unsteady or lightheaded. You might feel like you are going to pass out (faint). HOME CARE   Drink enough fluids to keep your pee (urine) clear or pale yellow.  Take your medicines exactly as told by your doctor. If you take blood pressure medicine, always stand up slowly from the lying or sitting position. Hold on to something to steady yourself.  If you need to stand in one place for a long time, move your legs often. Tighten and relax your leg muscles.  Have someone stay with you until you feel okay.  Do not drive or use heavy machinery if you feel dizzy.  Do not drink alcohol. GET HELP RIGHT AWAY IF:   You feel dizzy or lightheaded and it gets worse.  You feel sick to your stomach (nauseous), or you throw up (vomit).  You have trouble talking or walking.  You feel weak or have trouble using your arms, hands, or legs.  You cannot think clearly or have trouble forming sentences.  You have chest pain, belly (abdominal) pain, sweating, or you are short of breath.  Your vision changes.  You are bleeding.  You have problems from your medicine that seem to be getting worse. MAKE SURE YOU:   Understand these instructions.  Will watch your condition.  Will get help right away if you are not doing well or get worse. Document Released: 04/16/2011 Document Revised: 07/20/2011 Document Reviewed: 04/16/2011 [facility] Patient Information 2015 [place], [place]. This information is not intended to replace advice given to you by your health care provider. Make sure you discuss any questions you have with your health care  provider.  Emergency Department Resource Guide 1) Find a Doctor and Pay Out of Pocket Although you won't have to find out who is covered by your insurance plan, it is a good idea to ask around and get recommendations. You will then need to call the office and see if the doctor you have chosen will accept you as a new patient and what types of options they offer for patients who are self-pay. Some doctors offer discounts or will set up payment plans for their patients who do not have insurance, but you will need to ask so you aren't surprised when you get to your appointment.  2) Contact Your Local Health Department Not all health departments have doctors that can see patients for sick visits, but many do, so it is worth a call to see if yours does. If you don't know where your local health department is, you can check in your phone book. The CDC also has a tool to help you locate your state's health department, and many state websites also have listings of all of their local health departments.  3) Find a Walk-in Clinic If your illness is not likely to be very severe or complicated, you may want to try a walk in clinic. These are popping up all over the country in pharmacies, drugstores, and shopping centers. They're usually staffed by nurse practitioners or physician assistants that have been trained to treat common illnesses and complaints. They're usually fairly quick and inexpensive. However, if you have serious  medical issues or chronic medical problems, these are probably not your best option.  No Primary Care Doctor: - Call Health Connect at  [phone] - they can help you locate a primary care doctor that  accepts your insurance, provides certain services, etc. - Physician Referral Service- [phone]  Chronic Pain Problems: Organization         Address  Phone   Notes  [facility]  [phone] Patients need to be referred by their primary care doctor.   Medication  Assistance: Organization         Address  Phone   Notes  [facility] Medication [facility] [place] [address] [place], [place] [address] [phone] --Must be a resident of [facility] -- Must have NO insurance coverage whatsoever (no Medicaid/ Medicare, etc.) -- The pt. MUST have a primary care doctor that directs their care regularly and follows them in the community   [facility]  [phone]   [employer]  [phone]    Agencies that provide inexpensive medical care: Organization         Address  Phone   Notes  [place]  [phone]   [facility]    [phone]   [facility] [place], [place] [address] [phone]   [place] [address], [place] [phone]   Planned Parenthood    [phone]   [facility]    [phone]   [place] and [place] [address], [place] Phone:  [phone], Fax:  [phone] Hours of Operation:  9 am - 6 pm, M-F.  Also accepts Medicaid/Medicare and self-pay.  [facility] for [place] [address], [address], [place] Phone: [phone], Fax: [phone]. Hours of Operation:  8:30 am - 5:30 pm, M-F.  Also accepts Medicaid and self-pay.  [facility] [address], [place] Phone: [phone]   [place], [place], [place] [phone], Ext. [phone] Mondays & Thursdays: 7-9 AM.  First 15 patients are seen on a first come, first serve basis.    [place] Providers:  Organization         Address  Phone   Notes  [facility] [address],  [phone] Also accepts self-pay patients.  [facility] [address], [place]  [phone]   [place], [address], [place]  [phone]   [facility] [address] [place] [phone]   Lucianne Lei [address], [place]   [phone] Only accepts [place] Access [place] patients after they have their name applied to their card.   Self-Pay (no insurance) in [facility]:  Organization         Address  Phone   Notes  Sickle Cell Patients, [facility] [place] [phone]   [facility] [place] [phone]   [facility] [place]  [place], [address], [place] [phone]   [facility]/Dr. Osei-Bonsu  [address] [place] or [address], [place] [phone] Phone number for both [place] and [place] locations is  the same.  Urgent Medical and [facility] [address] [place] [phone]   [facility] [address], [place] or [address] [phone]   [facility] [address], [place] [phone], phone; [phone], fax Sees patients 1st and 3rd Saturday of every month.  Must not qualify for public or private insurance (i.e. Medicaid, Medicare, [place] Health Choice, Veterans' Benefits)  Household income should be no more than 200% of the poverty level The clinic cannot treat you if you are pregnant or think you are pregnant  Sexually transmitted diseases are not treated at the clinic.    Dental Care: Organization         Address  Phone  Notes  [facility] Department of [facility] [phone] Accepts children up to age 66 who are enrolled in [place] or [place]; pregnant women with a Medicaid card; and children who have applied for Medicaid or [place] Health Choice, but were declined, whose parents can pay a reduced fee at time of service.  [facility]  Department of [facility]  [address], [place] [phone] Accepts children up to age 56 who are enrolled in [place] or [place]; pregnant women with a Medicaid card; and children who have applied for Medicaid or [place] Health Choice, but were declined, whose parents can pay a reduced fee at time of service.  [place] Adult Dental Access PROGRAM  [place] [phone] Patients are seen by appointment only. Walk-ins are not accepted. [place] will see patients 64 years of age and older. Monday - Tuesday (8am-5pm) Most Wednesdays (8:30-5pm) $30 per visit, cash only  [facility] Adult Dental Access PROGRAM  [address], [facility] [phone] Patients are seen by appointment only. Walk-ins are not accepted. [place] will see patients 33 years of age and older. One Wednesday Evening (Monthly: Volunteer Based).  $30 per visit, cash only  [place]  [phone] for adults; Children under age 35, call Graduate Pediatric Dentistry at [phone]. Children aged 87-14, please call [phone] to request a pediatric application.  Dental services are provided in all areas of dental care including fillings, crowns and bridges, complete and partial dentures, implants, gum treatment, root canals, and extractions. Preventive care is also provided. Treatment is provided to both adults and children. Patients are selected via a lottery and there is often a waiting list.   [facility] [place] [address], [place]  [phone] [email or web address]   [facility] [address] [place], [place] [phone], Ext. [phone] Second and Fourth Thursday of each month, opens at 6:30 AM; Clinic ends at 9 AM.  Patients are seen on a first-come first-served basis, and a limited number are seen during each clinic.   [facility]  [address] Hillard Danker [place], [place] [phone]    Eligibility Requirements You must have lived in [place], [place], or [place] counties for at least the last three months.   You cannot be eligible for state or federal sponsored [employer], including [employer], [place], or [employer].   You generally cannot be eligible for healthcare insurance through your employer.    How to apply: Eligibility screenings are held every Tuesday and Wednesday afternoon from 1:00 pm until 4:00 pm. You do not need an appointment  for the interview!  [facility] [address] [place], [place]   [place]  [place] Department  [place]  [phone]    Behavioral Health Resources in the Community: Intensive Outpatient Programs Organization         Address  Phone  Notes  [place]. [address], [place], [place] [phone]   [place] [facility] [address] [place], [place]   ADS: Alcohol & Drug Svcs [address], [place], [place]   [place] [address],  [place], [place] or [phone]   Substance Abuse Resources Organization         Address  Phone  Notes  Alcohol and Drug Services  [phone]   [place]  [phone]   The [place]   Chinita Pester  [phone]   Residential & Outpatient Substance Abuse Program  [phone]   Psychological Services Organization         Address  Phone  Notes  [facility] [place]  [place]  [phone]   [place] [address] [place] or [phone]    Mobile Crisis Teams Organization         Address  Phone  Notes  [facility], Mobile Crisis Care Unit  [phone]   [facility] Psychotherapeutic Services  [address]  [place], [place]   [place] Levels [address] [place] [phone]    Self-Help/Support Groups Organization         Address  Phone             Notes  [place]. of [place] - variety of support groups  [place] Call for more information  Narcotics Anonymous (NA), [facility] [address], [employer] [place]  2 meetings at this location   Special educational needs teacher         Address  Phone  Notes  [facility] Residential Treatment [place],    [place]  [phone])[phone]   [facility]  [address] [place] [id number], [place], [place]   [place], [place] [phone] Admissions: 8am-3pm M-F  Incentives Substance [place] [address]    [place], [place] [phone]   [facility] [address] [place], [place], [place]   [facility] [address],  [place], [place]   [facility] - Intensive Outpatient [address] [place] [place] [address], [place], [place]   [facility].,  [place], [place] [phone] or [phone]   [facility] Services (RTS) [address], [place], [place] Accepts Medicaid  [facility] [address]  [place] [phone] Substance Abuse/Addiction Treatment   [facility] Organization         Address  Phone  Notes  [facility]  [phone]   Domenic Schwab, PhD [address] [place], [place]   [phone] or [phone]   [place], [place] [phone]   [place] [address], [place], [place] [phone] Insurance/Medicaid/sponsorship through [employer] and Families [address] [place] [address]  [place], [place] [phone] [place], [place] [phone]    Dr. Adele Schilder  [phone]   [facility] Dept. 1) [address] [place] 2) [place] 3)  [place] [address], [place] [phone]  [phone]   [place] [phone] or [phone] (After Hours)

## 2014-03-26 ENCOUNTER — Ambulatory Visit (INDEPENDENT_AMBULATORY_CARE_PROVIDER_SITE_OTHER): Payer: Self-pay | Admitting: Internal Medicine

## 2014-03-26 VITALS — BP 132/95 | HR 69 | Temp 98.2°F | Resp 18 | Ht 66.0 in | Wt 224.0 lb

## 2014-03-26 DIAGNOSIS — E876 Hypokalemia: Secondary | ICD-10-CM

## 2014-03-26 DIAGNOSIS — Z6836 Body mass index (BMI) 36.0-36.9, adult: Secondary | ICD-10-CM

## 2014-03-26 DIAGNOSIS — F411 Generalized anxiety disorder: Secondary | ICD-10-CM

## 2014-03-26 DIAGNOSIS — E669 Obesity, unspecified: Secondary | ICD-10-CM | POA: Insufficient documentation

## 2014-03-26 DIAGNOSIS — R7989 Other specified abnormal findings of blood chemistry: Secondary | ICD-10-CM

## 2014-03-26 DIAGNOSIS — D509 Iron deficiency anemia, unspecified: Secondary | ICD-10-CM

## 2014-03-26 DIAGNOSIS — R748 Abnormal levels of other serum enzymes: Secondary | ICD-10-CM

## 2014-03-26 DIAGNOSIS — R42 Dizziness and giddiness: Secondary | ICD-10-CM

## 2014-03-26 HISTORY — DX: Generalized anxiety disorder: F41.1

## 2014-03-26 LAB — POCT CBC
Granulocyte percent: 50 %G (ref 37–80)
HCT, POC: 40.4 % — AB (ref 43.5–53.7)
Hemoglobin: 12.2 g/dL — AB (ref 14.1–18.1)
Lymph, poc: 3.6 — AB (ref 0.6–3.4)
MCH, POC: 22.2 pg — AB (ref 27–31.2)
MCHC: 30.3 g/dL — AB (ref 31.8–35.4)
MCV: 73.2 fL — AB (ref 80–97)
MID (cbc): 0.5 (ref 0–0.9)
MPV: 7.6 fL (ref 0–99.8)
PLATELET COUNT, POC: 282 10*3/uL (ref 142–424)
POC GRANULOCYTE: 4.1 (ref 2–6.9)
POC LYMPH %: 44.5 % (ref 10–50)
POC MID %: 5.5 % (ref 0–12)
RBC: 5.52 M/uL (ref 4.69–6.13)
RDW, POC: 15.8 %
WBC: 8.2 10*3/uL (ref 4.6–10.2)

## 2014-03-26 LAB — COMPREHENSIVE METABOLIC PANEL
ALK PHOS: 99 U/L (ref 39–117)
ALT: 14 U/L (ref 0–53)
AST: 16 U/L (ref 0–37)
Albumin: 4 g/dL (ref 3.5–5.2)
BILIRUBIN TOTAL: 0.3 mg/dL (ref 0.2–1.2)
BUN: 10 mg/dL (ref 6–23)
CO2: 26 mEq/L (ref 19–32)
Calcium: 9.6 mg/dL (ref 8.4–10.5)
Chloride: 106 mEq/L (ref 96–112)
Creat: 1.21 mg/dL (ref 0.50–1.35)
GLUCOSE: 91 mg/dL (ref 70–99)
Potassium: 4 mEq/L (ref 3.5–5.3)
SODIUM: 140 meq/L (ref 135–145)
Total Protein: 7.7 g/dL (ref 6.0–8.3)

## 2014-03-26 LAB — IRON AND TIBC
%SAT: 18 % — ABNORMAL LOW (ref 20–55)
IRON: 49 ug/dL (ref 42–165)
TIBC: 266 ug/dL (ref 215–435)
UIBC: 217 ug/dL (ref 125–400)

## 2014-03-26 LAB — IFOBT (OCCULT BLOOD): IFOBT: NEGATIVE

## 2014-03-26 MED ORDER — MECLIZINE HCL 25 MG PO TABS: 25.0000 mg | ORAL_TABLET | Freq: Three times a day (TID) | ORAL | Status: DC | PRN

## 2014-03-26 MED ORDER — ALPRAZOLAM 0.25 MG PO TABS: 0.2500 mg | ORAL_TABLET | Freq: Every day | ORAL | Status: DC | PRN

## 2014-03-26 NOTE — Progress Notes (Signed)
Subjective:  This chart was scribed for Ellamae Siaobert Doolittle, MD by Haywood PaoNadim Abu Hashem, ED Scribe at [facility].The patient was seen in exam room 13 and the patient's care was started at 4:08 PM.   Patient ID: Derek Reed, male    DOB: 1986-01-23, 28 y.o.   MRN: [id number] Chief Complaint  Patient presents with  . Dizziness    pt had a work injury, was put on med that was found to make him dizzy. he needs note saying he can return to work    HPI  HPI Comments: Derek Reed is a 28 y.o. male with a history of anxiety who [facility] complaining of intermittent dizziness which began 5 days ago while at work. His first episode lasted about 20 min-. The ambulance was called and he states he was unable to walk to EMS. ER eval ?unrevealing. Was told due to flexeril which he quit taking--symptoms continue. He denies LOC, nausea, diaphoresis, and loss of vision.  Pt notes 11/6 he slipped and fell at work. He says he was hurt his shoulder, ribs and cut his nose. [facility] Was given flexril and naprosyn for his shoulder and ribs. No Head injury.  Yesterday at work he felt very light headed and dizzy. Pt states when he is walking and suddenly feels like is going to fall, also if he is standing and feels off balance. Not assoc with postur chges--seems unpredictable--no pal[itations. Having some HAs at work--randoms locations not affecting activity and not clearly associated with dizziness.  Note visit here 10/15 after ER 10/14 for chest pain-dx anxiety. Dr L started xanax which works very well!! Pt has stopped taking trazodone hs because it has been giving him HA. Pt states he occasionally has trouble sleeping which maybe due to anxiety. Pt states he needs a note to return to work. Note a number of recent ER visits  Denies HTN, DM, other health problems. Note recent ER labs show low K and high Creatinine  Review of Systems  Constitutional: Negative for fever,  diaphoresis, activity change, appetite change, fatigue and unexpected weight change.  HENT: Negative for trouble swallowing.   Eyes: Negative for photophobia and visual disturbance.  Respiratory: Negative for choking, chest tightness and shortness of breath.   Cardiovascular: Negative for chest pain, palpitations and leg swelling.  Gastrointestinal: Negative for nausea and abdominal pain.  Endocrine: Negative for polyuria.  Genitourinary: Negative for frequency.  Neurological: Positive for dizziness, light-headedness and headaches.  Psychiatric/Behavioral: Positive for sleep disturbance. Negative for dysphoric mood. The patient is nervous/anxious.       Objective:  BP 132/95 mmHg  Pulse 69  Temp(Src) 98.2 F (36.8 C) (Oral)  Resp 18  Ht 5\' 6"  (1.676 m)  Wt 224 lb (101.606 kg)  BMI 36.17 kg/m2  SpO2 99%  Physical Exam  Constitutional: He is oriented to person, place, and time. He appears well-developed and well-nourished. No distress.  HENT:  Head: Normocephalic and atraumatic.  Right Ear: External ear normal.  Left Ear: External ear normal.  Eyes: Conjunctivae and EOM are normal. Pupils are equal, round, and reactive to light.  Neck: Normal range of motion. Neck supple. No thyromegaly present.  Cardiovascular: Normal rate, regular rhythm and normal heart sounds.   No murmur heard. Pulmonary/Chest: Effort normal.  Musculoskeletal: Normal range of motion.  L shoulder with pain abduc >45 or ag resis Adduc full Ext rot full Tender L trapez  Neurological: He  is alert and oriented to person, place, and time. He has normal reflexes. No cranial nerve deficit. He exhibits normal muscle tone. Coordination normal.  His cerebellar is intact including no nystagmus and no ppting dizziness with rapid position change.  Skin: Skin is warm and dry.  Psychiatric: He has a normal mood and affect. His behavior is normal.  Nursing note and vitals reviewed.  Wt Readings from Last 3 Encounters:    03/26/14 224 lb (101.606 kg)  03/23/14 225 lb (102.059 kg)  02/22/14 224 lb 3.2 oz (101.696 kg)   Results for orders placed or performed in visit on 03/26/14  Comprehensive metabolic panel  Result Value Ref Range   Sodium 140 135 - 145 mEq/L   Potassium 4.0 3.5 - 5.3 mEq/L   Chloride 106 96 - 112 mEq/L   CO2 26 19 - 32 mEq/L   Glucose, Bld 91 70 - 99 mg/dL   BUN 10 6 - 23 mg/dL   Creat 1.11 7.35 - 6.70 mg/dL   Total Bilirubin 0.3 0.2 - 1.2 mg/dL   Alkaline Phosphatase 99 39 - 117 U/L   AST 16 0 - 37 U/L   ALT 14 0 - 53 U/L   Total Protein 7.7 6.0 - 8.3 g/dL   Albumin 4.0 3.5 - 5.2 g/dL   Calcium 9.6 8.4 - 14.1 mg/dL  Iron and TIBC  Result Value Ref Range   Iron 49 42 - 165 ug/dL   UIBC 030 131 - 438 ug/dL   TIBC 887 579 - 728 ug/dL   %SAT 18 (L) 20 - 55 %  POCT CBC  Result Value Ref Range   WBC 8.2 4.6 - 10.2 K/uL   Lymph, poc 3.6 (A) 0.6 - 3.4   POC LYMPH PERCENT 44.5 10 - 50 %L   MID (cbc) 0.5 0 - 0.9   POC MID % 5.5 0 - 12 %M   POC Granulocyte 4.1 2 - 6.9   Granulocyte percent 50.0 37 - 80 %G   RBC 5.52 4.69 - 6.13 M/uL   Hemoglobin 12.2 (A) 14.1 - 18.1 g/dL   HCT, POC 20.6 (A) 01.5 - 53.7 %   MCV 73.2 (A) 80 - 97 fL   MCH, POC 22.2 (A) 27 - 31.2 pg   MCHC 30.3 (A) 31.8 - 35.4 g/dL   RDW, POC 61.5 %   Platelet Count, POC 282 142 - 424 K/uL   MPV 7.6 0 - 99.8 fL       Assessment & Plan:  I personally performed the services described in this documentation, which was scribed in my presence. The recorded information has been reviewed and is accurate.  Hypokalemia - now resolved  Elevated serum creatinine - now resolved  Dizziness of unknown cause - ?BPV  Meclizine and Brandt-Darhoff manuvers  OOW  Reck 48hr  GAD (generalized anxiety disorder)  Cont meds for now  Microcytic anemia - Plan: IFOBT POC   BMI 36.0-36.9,adult  Shoulder contusion/tendonitis  HA-mild

## 2014-03-27 NOTE — Progress Notes (Signed)
Appt made for 14:15 on 03/28/14 with Derek Reed. Derek Reed

## 2014-03-28 ENCOUNTER — Telehealth: Payer: Self-pay | Admitting: Radiology

## 2014-03-28 ENCOUNTER — Encounter: Payer: Self-pay | Admitting: Family Medicine

## 2014-03-28 ENCOUNTER — Ambulatory Visit (INDEPENDENT_AMBULATORY_CARE_PROVIDER_SITE_OTHER): Payer: Self-pay | Admitting: Family Medicine

## 2014-03-28 VITALS — BP 116/84 | HR 89 | Temp 98.3°F | Resp 16 | Ht 66.0 in | Wt 219.2 lb

## 2014-03-28 DIAGNOSIS — Z6836 Body mass index (BMI) 36.0-36.9, adult: Secondary | ICD-10-CM

## 2014-03-28 DIAGNOSIS — Z23 Encounter for immunization: Secondary | ICD-10-CM

## 2014-03-28 DIAGNOSIS — R42 Dizziness and giddiness: Secondary | ICD-10-CM

## 2014-03-28 DIAGNOSIS — F411 Generalized anxiety disorder: Secondary | ICD-10-CM

## 2014-03-28 DIAGNOSIS — D649 Anemia, unspecified: Secondary | ICD-10-CM

## 2014-03-28 NOTE — Progress Notes (Signed)
   Subjective:    Patient ID: Derek Reed, male    DOB: [birth year], 28 y.o.   MRN: [id number]  HPI Patient presents today for follow up of dizziness, anxiety. He didn't get his prescriptions filled. He has not had any further episodes of chest tightness and anxiety. He is not sure what had caused it before. He has had 1-2 episodes of dizziness a day. Some sensation of spinning. Episode passes in a couple of minutes. No known precipitating factors. He is planning on getting his prescriptions filled today. He would like to go back to work tomorrow. He reports feeling much better.  Has had a pulsing in his temple, mild headache.  Had labs drawn 11/16- mild anemia with normal iron, TIBC, slightly decreased iron saturation.  Negative IFOBT.  Review of Systems Headache last night, no visual changes, no double vision/blurriness, some nausea, appetite normal. No fever/chills, no chest pain or SOB. No vomiting or abdominal pain.   Objective:   Physical Exam  Constitutional: He is oriented to person, place, and time. He appears well-developed and well-nourished.  HENT:  Head: Normocephalic and atraumatic.  Eyes: Conjunctivae are normal.  Neck: Normal range of motion. Neck supple.  Cardiovascular: Normal rate, regular rhythm and normal heart sounds.   Pulmonary/Chest: Effort normal and breath sounds normal.  Musculoskeletal: Normal range of motion.  Neurological: He is alert and oriented to person, place, and time.  Skin: Skin is warm and dry.  Psychiatric: He has a normal mood and affect. His behavior is normal. Judgment and thought content normal.  Vitals reviewed. BP 116/84 mmHg  Pulse 89  Temp(Src) 98.3 F (36.8 C) (Oral)  Resp 16  Ht 5\' 6"  (1.676 m)  Wt 219 lb 3.2 oz (99.428 kg)  BMI 35.40 kg/m2  SpO2 97%     Assessment & Plan:  Discussed with Dr. Merla Riches who had seen patient 03/26/2014. 1. Need for prophylactic vaccination and inoculation against influenza - Flu Vaccine QUAD  36+ mos IM  2. GAD (generalized anxiety disorder) -Patient to have Xanax prescription filled and will try for his anxiety.  3. BMI 36.0-36.9,adult  4. Dizziness -Patient to have meclizine filled and use for dizziness- he will return if any worsening symptoms, increased frequency.  5. Anemia, unspecified anemia type - CBC- recheck in 1 month   Emi Belfast, FNP-BC  [facility]  03/30/2014 5:53 PM

## 2014-03-28 NOTE — Patient Instructions (Signed)
Please return in 1 month to have your blood count checked If you are not feeling better in 2 weeks or if you are feeling worse, please come back in to see [place].   Dizziness Dizziness is a common problem. It is a feeling of unsteadiness or light-headedness. You may feel like you are about to faint. Dizziness can lead to injury if you stumble or fall. A person of any age group can suffer from dizziness, but dizziness is more common in older adults. CAUSES  Dizziness can be caused by many different things, including:  Middle ear problems.  Standing for too long.  Infections.  An allergic reaction.  Aging.  An emotional response to something, such as the sight of blood.  Side effects of medicines.  Tiredness.  Problems with circulation or blood pressure.  Excessive use of alcohol or medicines, or illegal drug use.  Breathing too fast (hyperventilation).  An irregular heart rhythm (arrhythmia).  A low red blood cell count (anemia).  Pregnancy.  Vomiting, diarrhea, fever, or other illnesses that cause body fluid loss (dehydration).  Diseases or conditions such as Parkinson's disease, high blood pressure (hypertension), diabetes, and thyroid problems.  Exposure to extreme heat. DIAGNOSIS  Your health care provider will ask about your symptoms, perform a physical exam, and perform an electrocardiogram (ECG) to record the electrical activity of your heart. Your health care provider may also perform other heart or blood tests to determine the cause of your dizziness. These may include:  Transthoracic echocardiogram (TTE). During echocardiography, sound waves are used to evaluate how blood flows through your heart.  Transesophageal echocardiogram (TEE).  Cardiac monitoring. This allows your health care provider to monitor your heart rate and rhythm in real time.  Holter monitor. This is a portable device that records your heartbeat and can help diagnose heart arrhythmias. It  allows your health care provider to track your heart activity for several days if needed.  Stress tests by exercise or by giving medicine that makes the heart beat faster. TREATMENT  Treatment of dizziness depends on the cause of your symptoms and can vary greatly. HOME CARE INSTRUCTIONS   Drink enough fluids to keep your urine clear or pale yellow. This is especially important in very hot weather. In older adults, it is also important in cold weather.  Take your medicine exactly as directed if your dizziness is caused by medicines. When taking blood pressure medicines, it is especially important to get up slowly.  Rise slowly from chairs and steady yourself until you feel okay.  In the morning, first sit up on the side of the bed. When you feel okay, stand slowly while holding onto something until you know your balance is fine.  Move your legs often if you need to stand in one place for a long time. Tighten and relax your muscles in your legs while standing.  Have someone stay with you for 1-2 days if dizziness continues to be a problem. Do this until you feel you are well enough to stay alone. Have the person call your health care provider if he or she notices changes in you that are concerning.  Do not drive or use heavy machinery if you feel dizzy.  Do not drink alcohol. SEEK IMMEDIATE MEDICAL CARE IF:   Your dizziness or light-headedness gets worse.  You feel nauseous or vomit.  You have problems talking, walking, or using your arms, hands, or legs.  You feel weak.  You are not thinking clearly or  you have trouble forming sentences. It may take a friend or family member to notice this.  You have chest pain, abdominal pain, shortness of breath, or sweating.  Your vision changes.  You notice any bleeding.  You have side effects from medicine that seems to be getting worse rather than better. MAKE SURE YOU:   Understand these instructions.  Will watch your  condition.  Will get help right away if you are not doing well or get worse. Document Released: 10/21/2000 Document Revised: 05/02/2013 Document Reviewed: 11/14/2010 [facility] Patient Information 2015 [employer] [place]. This information is not intended to replace advice given to you by your health care provider. Make sure you discuss any questions you have with your health care provider.

## 2014-03-28 NOTE — Telephone Encounter (Signed)
Pt calling about lab results  

## 2014-03-29 ENCOUNTER — Encounter: Payer: Self-pay | Admitting: Internal Medicine

## 2014-04-19 ENCOUNTER — Other Ambulatory Visit: Payer: Self-pay | Admitting: Family Medicine

## 2014-04-19 ENCOUNTER — Telehealth: Payer: Self-pay | Admitting: Internal Medicine

## 2014-04-19 DIAGNOSIS — D649 Anemia, unspecified: Secondary | ICD-10-CM

## 2014-04-19 NOTE — Telephone Encounter (Signed)
Form put in Dr. Merla Riches box

## 2014-04-19 NOTE — Telephone Encounter (Signed)
Patient dropped of a form for authorization to begin/return to work for non-work related injury or illness. Patient request for Dr. Merla Riches to complete the form. Patient stated he need the form completed soon as possible. I will drop the form off at [address] and place it into the nurse's box. Thank you. [phone].

## 2014-04-20 LAB — CBC
HEMATOCRIT: 37.8 % — AB (ref 39.0–52.0)
HEMOGLOBIN: 12.3 g/dL — AB (ref 13.0–17.0)
MCH: 22.8 pg — AB (ref 26.0–34.0)
MCHC: 32.5 g/dL (ref 30.0–36.0)
MCV: 70 fL — ABNORMAL LOW (ref 78.0–100.0)
MPV: 10.1 fL (ref 9.4–12.4)
Platelets: 270 10*3/uL (ref 150–400)
RBC: 5.4 MIL/uL (ref 4.22–5.81)
RDW: 16.1 % — ABNORMAL HIGH (ref 11.5–15.5)
WBC: 8.6 10*3/uL (ref 4.0–10.5)

## 2014-04-24 ENCOUNTER — Telehealth: Payer: Self-pay

## 2014-04-24 NOTE — Telephone Encounter (Signed)
Pt called in stating that he was having really bad panic attack and wanted to know if Merla Riches would give him some advice on handling them better or if he could get a different type of medicine for this. He would like to be called back at [phone] and he uses the [facility] on [address].

## 2014-04-25 NOTE — Telephone Encounter (Signed)
Lets have him evaluated to get on a better treatment plan---by psychiatry  moheed Jannifer Franklin is the name I think

## 2014-04-25 NOTE — Telephone Encounter (Signed)
2. GAD (generalized anxiety disorder) -Patient to have Xanax prescription filled and will try for his anxiety.  Called patient, did he get the Xanax filled? Dr Merla Riches wanted him to try this, left message for him to call back.

## 2014-04-25 NOTE — Telephone Encounter (Signed)
Pt CB and reported that when he had the panic attack yesterday, he did take the alprazolam and it calmed him down, but the paramedic wanted him to let Dr Merla Riches know that he had a panic attack. Pt stated that since he has been taking the alprazolam, he may go some days w/out any problem but then, other days, like yesterday he will have a panic attack "out of nowhere". Dr Merla Riches, do you want to add or change any medication, or see pt back to discuss? Any other suggestions?

## 2014-04-26 NOTE — Telephone Encounter (Signed)
Spoke to pt. Informed him of the psychiatry referral. I will go ahead and put the referral in.

## 2014-05-09 ENCOUNTER — Other Ambulatory Visit: Payer: Self-pay | Admitting: Internal Medicine

## 2014-05-09 DIAGNOSIS — F410 Panic disorder [episodic paroxysmal anxiety] without agoraphobia: Secondary | ICD-10-CM

## 2014-05-10 NOTE — Telephone Encounter (Signed)
The telephone triage since his visit in November indicates that we are trying to refer him to psychiatry but apparently the referral mentioned was never made or at least I cannot find it in the record. He was supposed to be referred to Dr. Thedore Mins- [phone] psychiatry---ok to call in the xanax as last given and set up referral

## 2014-05-11 NOTE — Addendum Note (Signed)
Addended by: Sheppard Plumber A on: 05/11/2014 02:06 PM   Modules accepted: Orders

## 2014-05-11 NOTE — Telephone Encounter (Signed)
Notified pt I am sending in Rf. Also explained that referral didn't get sent to Referral dept last week, but I am sending it now and he should be hearing from them. Pt agreed.

## 2014-05-28 ENCOUNTER — Encounter (HOSPITAL_BASED_OUTPATIENT_CLINIC_OR_DEPARTMENT_OTHER): Payer: Self-pay | Admitting: *Deleted

## 2014-05-28 ENCOUNTER — Emergency Department (HOSPITAL_BASED_OUTPATIENT_CLINIC_OR_DEPARTMENT_OTHER)
Admission: EM | Admit: 2014-05-28 | Discharge: 2014-05-28 | Disposition: A | Discharge status: Home / Self Care | Payer: Self-pay | Attending: Emergency Medicine | Admitting: Emergency Medicine

## 2014-05-28 ENCOUNTER — Emergency Department (HOSPITAL_BASED_OUTPATIENT_CLINIC_OR_DEPARTMENT_OTHER): Payer: Self-pay

## 2014-05-28 DIAGNOSIS — R079 Chest pain, unspecified: Secondary | ICD-10-CM

## 2014-05-28 DIAGNOSIS — Z791 Long term (current) use of non-steroidal anti-inflammatories (NSAID): Secondary | ICD-10-CM | POA: Insufficient documentation

## 2014-05-28 DIAGNOSIS — Z79899 Other long term (current) drug therapy: Secondary | ICD-10-CM | POA: Insufficient documentation

## 2014-05-28 DIAGNOSIS — F419 Anxiety disorder, unspecified: Secondary | ICD-10-CM | POA: Insufficient documentation

## 2014-05-28 DIAGNOSIS — E876 Hypokalemia: Secondary | ICD-10-CM | POA: Insufficient documentation

## 2014-05-28 DIAGNOSIS — R0789 Other chest pain: Secondary | ICD-10-CM | POA: Insufficient documentation

## 2014-05-28 LAB — CBC WITH DIFFERENTIAL/PLATELET
Basophils Absolute: 0 10*3/uL (ref 0.0–0.1)
Basophils Relative: 0 % (ref 0–1)
EOS PCT: 6 % — AB (ref 0–5)
Eosinophils Absolute: 0.5 10*3/uL (ref 0.0–0.7)
HEMATOCRIT: 38.4 % — AB (ref 39.0–52.0)
Hemoglobin: 12.6 g/dL — ABNORMAL LOW (ref 13.0–17.0)
LYMPHS PCT: 41 % (ref 12–46)
Lymphs Abs: 3.8 10*3/uL (ref 0.7–4.0)
MCH: 23 pg — ABNORMAL LOW (ref 26.0–34.0)
MCHC: 32.8 g/dL (ref 30.0–36.0)
MCV: 70.1 fL — ABNORMAL LOW (ref 78.0–100.0)
Monocytes Absolute: 1 10*3/uL (ref 0.1–1.0)
Monocytes Relative: 10 % (ref 3–12)
NEUTROS ABS: 4 10*3/uL (ref 1.7–7.7)
Neutrophils Relative %: 43 % (ref 43–77)
PLATELETS: 285 10*3/uL (ref 150–400)
RBC: 5.48 MIL/uL (ref 4.22–5.81)
RDW: 16.4 % — AB (ref 11.5–15.5)
WBC: 9.3 10*3/uL (ref 4.0–10.5)

## 2014-05-28 LAB — BASIC METABOLIC PANEL
Anion gap: 5 (ref 5–15)
BUN: 11 mg/dL (ref 6–23)
CO2: 29 mmol/L (ref 19–32)
CREATININE: 1.13 mg/dL (ref 0.50–1.35)
Calcium: 8.6 mg/dL (ref 8.4–10.5)
Chloride: 105 mEq/L (ref 96–112)
GFR calc Af Amer: >90 mL/min (ref >90)
GFR, EST NON AFRICAN AMERICAN: 86 mL/min — AB (ref >90)
Glucose, Bld: 99 mg/dL (ref 70–99)
Potassium: 3.3 mmol/L — ABNORMAL LOW (ref 3.5–5.1)
SODIUM: 139 mmol/L (ref 135–145)

## 2014-05-28 LAB — TROPONIN I: Troponin I: <0.03 ng/mL (ref <0.031)

## 2014-05-28 NOTE — Discharge Instructions (Signed)

## 2014-05-28 NOTE — ED Notes (Signed)
Chest pain off and on for 2 months. He has been seen here for the same. Pain is in the center and lateral chest. His face and head feels tingly at times. He is taking Alprazolam for anxiety that helps sometimes.

## 2014-05-28 NOTE — ED Notes (Signed)
Patient transported to X-ray 

## 2014-05-28 NOTE — ED Provider Notes (Signed)
CSN: [id number]     Arrival date & time 05/28/14  1143 History   First MD Initiated Contact with Patient 05/28/14 1346     Chief Complaint  Patient presents with  . Chest Pain     (Consider location/radiation/quality/duration/timing/severity/associated sxs/prior Treatment) HPI Comments: Pt states that he has been having intermittent cp over the last 2 months. Pt states that he has been seem multiple times for the symptoms and he has tingling in her face and hands at times. Does take anxiety medication once a day, but states that it doesn't seem to calm him down as much as it used to.   The history is provided by the patient. No language interpreter was used.    History reviewed. No pertinent past medical history. History reviewed. No pertinent past surgical history. Family History  Problem Relation Age of Onset  . Hyperlipidemia Mother   . Diabetes Mother   . Stroke Mother   . Hypertension Mother   . Hypertension Father    History  Substance Use Topics  . Smoking status: Former Games developer  . Smokeless tobacco: Never Used     Comment: quit in January  . Alcohol Use: No    Review of Systems  Respiratory: Negative for shortness of breath.   All other systems reviewed and are negative.     Allergies  Review of patient's allergies indicates no known allergies.  Home Medications   Prior to Admission medications   Medication Sig Start Date End Date Taking? Authorizing Provider  ALPRAZolam Prudy Feeler) 0.25 MG tablet TAKE 1 TABLET BY MOUTH EVERY DAY AS NEEDED FOR ANXIETY 05/11/14   Tonye Pearson, MD  cyclobenzaprine (FLEXERIL) 10 MG tablet Take 1 tablet (10 mg total) by mouth 2 (two) times daily as needed for muscle spasms. 03/18/14   Tiffany Irine Seal, PA-C  HYDROcodone-acetaminophen (NORCO/VICODIN) 5-325 MG per tablet Take 1-2 tablets by mouth every 4 (four) hours as needed for moderate pain or severe pain. 03/18/14   Tiffany Irine Seal, PA-C  ibuprofen (ADVIL,MOTRIN) 200 MG tablet  Take 400 mg by mouth every 6 (six) hours as needed for headache (headache).    Historical Provider, MD  meclizine (ANTIVERT) 25 MG tablet Take 1 tablet (25 mg total) by mouth 3 (three) times daily as needed for dizziness. Especially hs 03/26/14   Tonye Pearson, MD  naproxen (NAPROSYN) 375 MG tablet Take 1 tablet (375 mg total) by mouth 2 (two) times daily. 03/18/14   Tiffany Irine Seal, PA-C  traZODone (DESYREL) 50 MG tablet Take 0.5-1 tablets (25-50 mg total) by mouth at bedtime as needed for sleep. 02/22/14   Elvina Sidle, MD   BP 143/96 mmHg  Pulse 78  Temp(Src) 98 F (36.7 C) (Oral)  Resp 20  Ht  (1.676 m)  Wt 225 lb (102.059 kg)  BMI 36.33 kg/m2  SpO2 98% Physical Exam  Constitutional: He is oriented to person, place, and time. He appears well-developed and well-nourished.  HENT:  Head: Normocephalic and atraumatic.  Eyes: Conjunctivae and EOM are normal. Pupils are equal, round, and reactive to light.  Cardiovascular: Normal rate and regular rhythm.   Pulmonary/Chest: Effort normal.  Abdominal: Soft. Bowel sounds are normal. There is no tenderness.  Musculoskeletal: Normal range of motion.  Neurological: He is alert and oriented to person, place, and time.  Skin: Skin is warm and dry.  Psychiatric: He has a normal mood and affect.  Nursing note and vitals reviewed.   ED Course  Procedures (including  critical care time) Labs Review Labs Reviewed  CBC WITH DIFFERENTIAL - Abnormal; Notable for the following:    Hemoglobin 12.6 (*)    HCT 38.4 (*)    MCV 70.1 (*)    MCH 23.0 (*)    RDW 16.4 (*)    Eosinophils Relative 6 (*)    All other components within normal limits  BASIC METABOLIC PANEL  TROPONIN I    Imaging Review Dg Chest 2 View  05/28/2014   CLINICAL DATA:  Two week history of chest pain  EXAM: CHEST  2 VIEW  COMPARISON:  March 18, 2014  FINDINGS: The lungs are clear. Heart size and pulmonary vascularity are normal. No adenopathy. No pneumothorax.  No bone lesions.  IMPRESSION: No edema or consolidation.   Electronically Signed   By: Bretta Bang M.D.   On: 05/28/2014 14:20     Date: 05/28/2014  Rate: 81  Rhythm: normal sinus rhythm  QRS Axis: normal  Intervals: normal  ST/T Wave abnormalities: nonspecific T wave changes  Conduction Disutrbances:none  Narrative Interpretation:   Old EKG Reviewed: unchanged    MDM   Final diagnoses:  Hypokalemia  Other chest pain  Anxiety    Doubt acs. More likely anxiety. Pt can follow up with pcp and come up with plan. Pt is not having pain at this time    Teressa Lower, NP 05/28/14 1604  Candyce Churn III, MD 05/28/14 [phone]

## 2014-05-30 ENCOUNTER — Ambulatory Visit (INDEPENDENT_AMBULATORY_CARE_PROVIDER_SITE_OTHER): Payer: Self-pay | Admitting: Family Medicine

## 2014-05-30 VITALS — BP 140/92 | HR 102 | Temp 98.7°F | Resp 18 | Ht 67.5 in | Wt 238.4 lb

## 2014-05-30 DIAGNOSIS — F411 Generalized anxiety disorder: Secondary | ICD-10-CM

## 2014-05-30 MED ORDER — ALPRAZOLAM 0.25 MG PO TABS: ORAL_TABLET | ORAL | Status: DC

## 2014-05-30 MED ORDER — SERTRALINE HCL 50 MG PO TABS: 50.0000 mg | ORAL_TABLET | Freq: Every day | ORAL | Status: DC

## 2014-05-30 NOTE — Progress Notes (Signed)
° °  Subjective:    Patient ID: Derek Reed, male    DOB: 1986/04/16, 29 y.o.   MRN: [id number] This chart was scribed for Elvina Sidle, MD by Jolene Provost, Medical Scribe. This patient was seen in Room 14 and the patient's care was started a 11:31 AM.  Chief Complaint  Patient presents with   Anxiety    Increased anxiety over the past month    Panic Attack    More panic attacks over the past month- recently seen in ED, it was suggested that he start an anti-depressant as well    Headache    x1 week- pt thinks they may be stress/anxiety related    HPI HPI Comments: Derek Reed is a 29 y.o. male with a hx of GAD who presents to [facility] complaining of increased anxiety and panic attacks over the last month. Pt also endorses associated HA. Pt states that he believes his sx are worsening. Pt states that he believes his medication is not working anymore. Pt states that he woke up last night in the middle of the night having a panic attack. Pt endorses associated eye twitching and chest pressure. Pt states that he would like to see a therapist, and would like a medication adjustment.    Review of Systems  Constitutional: Negative for fever and chills.  Respiratory:       Chest pressure.  Neurological: Positive for headaches.  Psychiatric/Behavioral: Positive for sleep disturbance and agitation.       Objective:   Physical Exam  Constitutional: He is oriented to person, place, and time. He appears well-developed and well-nourished.  HENT:  Head: Normocephalic and atraumatic.  Eyes: Pupils are equal, round, and reactive to light.  Neck: Neck supple.  Cardiovascular: Normal rate and regular rhythm.   Pulmonary/Chest: Effort normal and breath sounds normal. No respiratory distress.  Neurological: He is alert and oriented to person, place, and time.  Skin: Skin is warm and dry.  Psychiatric: He has a normal mood and affect. His behavior is normal.  Nursing note and vitals  reviewed.      Assessment & Plan:   This chart was scribed in my presence and reviewed by me personally.    ICD-9-CM ICD-10-CM   1. Generalized anxiety disorder 300.02 F41.1 sertraline (ZOLOFT) 50 MG tablet     Ambulatory referral to Psychology     Signed, Elvina Sidle, MD

## 2014-05-30 NOTE — Patient Instructions (Signed)
Generalized Anxiety Disorder Generalized anxiety disorder (GAD) is a mental disorder. It interferes with life functions, including relationships, work, and school. GAD is different from normal anxiety, which everyone experiences at some point in their lives in response to specific life events and activities. Normal anxiety actually helps us prepare for and get through these life events and activities. Normal anxiety goes away after the event or activity is over.  GAD causes anxiety that is not necessarily related to specific events or activities. It also causes excess anxiety in proportion to specific events or activities. The anxiety associated with GAD is also difficult to control. GAD can vary from mild to severe. People with severe GAD can have intense waves of anxiety with physical symptoms (panic attacks).  SYMPTOMS The anxiety and worry associated with GAD are difficult to control. This anxiety and worry are related to many life events and activities and also occur more days than not for 6 months or longer. People with GAD also have three or more of the following symptoms (one or more in children):  Restlessness.   Fatigue.  Difficulty concentrating.   Irritability.  Muscle tension.  Difficulty sleeping or unsatisfying sleep. DIAGNOSIS GAD is diagnosed through an assessment by your health care provider. Your health care provider will ask you questions aboutyour mood,physical symptoms, and events in your life. Your health care provider may ask you about your medical history and use of alcohol or drugs, including prescription medicines. Your health care provider may also do a physical exam and blood tests. Certain medical conditions and the use of certain substances can cause symptoms similar to those associated with GAD. Your health care provider may refer you to a mental health specialist for further evaluation. TREATMENT The following therapies are usually used to treat GAD:    Medication. Antidepressant medication usually is prescribed for long-term daily control. Antianxiety medicines may be added in severe cases, especially when panic attacks occur.   Talk therapy (psychotherapy). Certain types of talk therapy can be helpful in treating GAD by providing support, education, and guidance. A form of talk therapy called cognitive behavioral therapy can teach you healthy ways to think about and react to daily life events and activities.  Stress managementtechniques. These include yoga, meditation, and exercise and can be very helpful when they are practiced regularly. A mental health specialist can help determine which treatment is best for you. Some people see improvement with one therapy. However, other people require a combination of therapies. Document Released: 08/22/2012 Document Revised: 09/11/2013 Document Reviewed: 08/22/2012 [employer] Patient Information 2015 [employer]. This information is not intended to replace advice given to you by your health care provider. Make sure you discuss any questions you have with your health care provider.  

## 2014-06-04 ENCOUNTER — Ambulatory Visit: Payer: Self-pay

## 2014-06-04 ENCOUNTER — Telehealth: Payer: Self-pay

## 2014-06-04 DIAGNOSIS — F39 Unspecified mood [affective] disorder: Secondary | ICD-10-CM

## 2014-06-04 MED ORDER — BUPROPION HCL 75 MG PO TABS: 75.0000 mg | ORAL_TABLET | Freq: Two times a day (BID) | ORAL | Status: DC

## 2014-06-04 NOTE — Telephone Encounter (Signed)
sertraline (ZOLOFT) 50 MG tablet   Side effects numess in face, tingling, breaking out in sweat, dizzy  [phone]

## 2014-06-04 NOTE — Telephone Encounter (Signed)
Stop the medicine (sertraline) Once symptoms have subsided, have patient pick up wellbutrin qd

## 2014-06-04 NOTE — Telephone Encounter (Signed)
Dr. Milus Glazier, any suggestions for pt? I tried to get in touch with him to get more information but I had to leave a message for him to call back.

## 2014-06-06 NOTE — Telephone Encounter (Signed)
Spoke with pt, advised message from Dr. Lauenstein. Pt understood. 

## 2014-06-26 ENCOUNTER — Telehealth: Payer: Self-pay | Admitting: *Deleted

## 2014-06-26 NOTE — Telephone Encounter (Signed)
Pt called and stated he believe he was having an allergic reaction to his medication (Wellburtin). He stated he was having dry mouth and dizzy spells.  Pt was given this medication on 06/04/14.  He stated he just got it filled on yesterday (06/25/14) and today was his first time taking it. He denied having any other sxs at the time.  I advised pt that we would be more than happy to see him today to make sure everything was okay and if he could not make it here or if his sxs got worse then he would need to go to the emergency room.  Pt understood.

## 2014-07-22 ENCOUNTER — Other Ambulatory Visit: Payer: Self-pay | Admitting: Internal Medicine

## 2014-08-15 ENCOUNTER — Emergency Department (HOSPITAL_BASED_OUTPATIENT_CLINIC_OR_DEPARTMENT_OTHER)
Admission: EM | Admit: 2014-08-15 | Discharge: 2014-08-15 | Disposition: A | Discharge status: Home / Self Care | Payer: Self-pay | Attending: Emergency Medicine | Admitting: Emergency Medicine

## 2014-08-15 ENCOUNTER — Encounter (HOSPITAL_BASED_OUTPATIENT_CLINIC_OR_DEPARTMENT_OTHER): Payer: Self-pay | Admitting: *Deleted

## 2014-08-15 DIAGNOSIS — Z87891 Personal history of nicotine dependence: Secondary | ICD-10-CM | POA: Insufficient documentation

## 2014-08-15 DIAGNOSIS — F411 Generalized anxiety disorder: Secondary | ICD-10-CM | POA: Insufficient documentation

## 2014-08-15 HISTORY — DX: Anxiety disorder, unspecified: F41.9

## 2014-08-15 NOTE — ED Provider Notes (Signed)
CSN: [id number]     Arrival date & time 08/15/14  1000 History   First MD Initiated Contact with Patient 08/15/14 1039     Chief Complaint  Patient presents with  . Anxiety     (Consider location/radiation/quality/duration/timing/severity/associated sxs/prior Treatment) Patient is a 29 y.o. male presenting with anxiety. The history is provided by the patient.  Anxiety Pertinent negatives include no chest pain, no abdominal pain, no headaches and no shortness of breath.   Patient with long-standing history of anxiety is been on antianxiety meds for about 6 months. At one time seen by [facility] behavioral health but did not follow-up. Patient has been taking 0.25 mg of Xanax daily recently stopped. Starting to show evidence of some mild withdrawal. Patient denies any suicidal or homicidal ideations. Never had any formal treatment for the underlying sources of the anxiety.  Past Medical History  Diagnosis Date  . Anxiety    History reviewed. No pertinent past surgical history. Family History  Problem Relation Age of Onset  . Hyperlipidemia Mother   . Diabetes Mother   . Stroke Mother   . Hypertension Mother   . Hypertension Father    History  Substance Use Topics  . Smoking status: Former Games developer  . Smokeless tobacco: Never Used     Comment: quit in January  . Alcohol Use: No    Review of Systems  Constitutional: Negative for fever.  HENT: Negative for congestion.   Respiratory: Negative for shortness of breath.   Cardiovascular: Negative for chest pain.  Gastrointestinal: Negative for abdominal pain.  Genitourinary: Negative for dysuria.  Skin: Negative for rash.  Neurological: Negative for headaches.  Hematological: Does not bruise/bleed easily.  Psychiatric/Behavioral: Negative for suicidal ideas and confusion. The patient is nervous/anxious.       Allergies  Review of patient's allergies indicates no known allergies.  Home Medications   Prior to Admission  medications   Medication Sig Start Date End Date Taking? Authorizing Provider  ALPRAZolam (XANAX) 0.25 MG tablet TAKE 1 TABLET BY MOUTH EVERY DAY AS NEEDED FOR ANXIETY 05/30/14  Yes Elvina Sidle, MD   BP 137/87 mmHg  Pulse 78  Temp(Src) 98.2 F (36.8 C) (Oral)  Resp 16  Ht 5\' 6"  (1.676 m)  Wt 243 lb 12.8 oz (110.587 kg)  BMI 39.37 kg/m2  SpO2 100% Physical Exam  Constitutional: He is oriented to person, place, and time. He appears well-developed and well-nourished. No distress.  HENT:  Head: Normocephalic and atraumatic.  Mouth/Throat: Oropharynx is clear and moist.  Eyes: Conjunctivae and EOM are normal. Pupils are equal, round, and reactive to light.  Neck: Normal range of motion.  Cardiovascular: Normal rate, regular rhythm and normal heart sounds.   No murmur heard. Pulmonary/Chest: Effort normal and breath sounds normal. No respiratory distress.  Abdominal: Soft. Bowel sounds are normal. There is no tenderness.  Musculoskeletal: Normal range of motion.  Neurological: He is alert and oriented to person, place, and time. No cranial nerve deficit. He exhibits normal muscle tone. Coordination normal.  Skin: Skin is warm. No rash noted.  Nursing note and vitals reviewed.   ED Course  Procedures (including critical care time) Labs Review Labs Reviewed - No data to display  Imaging Review No results found.   EKG Interpretation None      MDM   Final diagnoses:  GAD (generalized anxiety disorder)    Patient diagnosed with anxiety disorder. Was seen at [facility] but did not follow-up. Patient's been on Xanax for about  6 months. Stopped it and having symptoms consistent with some mild withdrawal. Patient still has the Xanax we'll have him restart it on a daily basis. And follow-up with behavioral health. Patient has no suicidal or homicidal ideations.    Vanetta Mulders, MD 08/15/14 [phone]

## 2014-08-15 NOTE — ED Notes (Signed)
C/o anxious and is on alprazolam 0.25 as needed. States he has not taken in 3-4 days trying to wean hisself off the meds and then he read about side effects of coming off med abruptly. States he took them for last  2 days and feel like his anxiety is worse today.

## 2014-08-15 NOTE — Discharge Instructions (Signed)
Generalized Anxiety Disorder Generalized anxiety disorder (GAD) is a mental disorder. It interferes with life functions, including relationships, work, and school. GAD is different from normal anxiety, which everyone experiences at some point in their lives in response to specific life events and activities. Normal anxiety actually helps [place] prepare for and get through these life events and activities. Normal anxiety goes away after the event or activity is over.  GAD causes anxiety that is not necessarily related to specific events or activities. It also causes excess anxiety in proportion to specific events or activities. The anxiety associated with GAD is also difficult to control. GAD can vary from mild to severe. People with severe GAD can have intense waves of anxiety with physical symptoms (panic attacks).  SYMPTOMS The anxiety and worry associated with GAD are difficult to control. This anxiety and worry are related to many life events and activities and also occur more days than not for 6 months or longer. People with GAD also have three or more of the following symptoms (one or more in children):  Restlessness.   Fatigue.  Difficulty concentrating.   Irritability.  Muscle tension.  Difficulty sleeping or unsatisfying sleep. DIAGNOSIS GAD is diagnosed through an assessment by your health care provider. Your health care provider will ask you questions aboutyour mood,physical symptoms, and events in your life. Your health care provider may ask you about your medical history and use of alcohol or drugs, including prescription medicines. Your health care provider may also do a physical exam and blood tests. Certain medical conditions and the use of certain substances can cause symptoms similar to those associated with GAD. Your health care provider may refer you to a mental health specialist for further evaluation. TREATMENT The following therapies are usually used to treat GAD:    Medication. Antidepressant medication usually is prescribed for long-term daily control. Antianxiety medicines may be added in severe cases, especially when panic attacks occur.   Talk therapy (psychotherapy). Certain types of talk therapy can be helpful in treating GAD by providing support, education, and guidance. A form of talk therapy called cognitive behavioral therapy can teach you healthy ways to think about and react to daily life events and activities.  Stress managementtechniques. These include yoga, meditation, and exercise and can be very helpful when they are practiced regularly. A mental health specialist can help determine which treatment is best for you. Some people see improvement with one therapy. However, other people require a combination of therapies. Document Released: 08/22/2012 Document Revised: 09/11/2013 Document Reviewed: 08/22/2012 [facility] Patient Information 2015 [place], [place]. This information is not intended to replace advice given to you by your health care provider. Make sure you discuss any questions you have with your health care provider.  Restart her medicine as we discussed. Use the resource guide below to help you find a regular doctor. [facility] up with [facility] her behavioral health in the next several days would be very important.    Emergency Department Resource Guide 1) Find a Doctor and Pay Out of Pocket Although you won't have to find out who is covered by your insurance plan, it is a good idea to ask around and get recommendations. You will then need to call the office and see if the doctor you have chosen will accept you as a new patient and what types of options they offer for patients who are self-pay. Some doctors offer discounts or will set up payment plans for their patients who do not have insurance, but  you will need to ask so you aren't surprised when you get to your appointment.  2) Contact Your Local Health Department Not all health  departments have doctors that can see patients for sick visits, but many do, so it is worth a call to see if yours does. If you don't know where your local health department is, you can check in your phone book. The CDC also has a tool to help you locate your state's health department, and many state websites also have listings of all of their local health departments.  3) Find a Walk-in Clinic If your illness is not likely to be very severe or complicated, you may want to try a walk in clinic. These are popping up all over the country in pharmacies, drugstores, and shopping centers. They're usually staffed by nurse practitioners or physician assistants that have been trained to treat common illnesses and complaints. They're usually fairly quick and inexpensive. However, if you have serious medical issues or chronic medical problems, these are probably not your best option.  No Primary Care Doctor: - Call Health Connect at  [phone] - they can help you locate a primary care doctor that  accepts your insurance, provides certain services, etc. - Physician Referral Service- [phone]  Chronic Pain Problems: Organization         Address  Phone   Notes  [facility]  [phone] Patients need to be referred by their primary care doctor.   Medication Assistance: Organization         Address  Phone   Notes  [facility] Medication [facility] [address] [place], [place] [address] [phone] --Must be a resident of [facility] -- Must have NO insurance coverage whatsoever (no Medicaid/ Medicare, etc.) -- The pt. MUST have a primary care doctor that directs their care regularly and follows them in the community   [facility]ist  [phone]   [employer]  [phone]    Agencies that provide inexpensive medical care: Organization         Address  Phone   Notes  [facility]  [phone]   [facility]     [phone]   [facility] [address] [place], [place] [address] [phone]   [facility] [address] [place] [place] [phone]   Planned Parenthood    [phone]   [facility]    [phone]   [facility]  [address], [place] Phone:  [phone], Fax:  [phone] Hours of Operation:  9 am - 6 pm, M-F.  Also accepts Medicaid/Medicare and self-pay.  [facility]  [address], [place] Phone: [phone], Fax: [phone]. Hours of Operation:  8:30 am - 5:30 pm, M-F.  Also accepts Medicaid and self-pay.  [facility] [place] [address], [place] Phone: [phone]   [facility] [address] Natasha Bence [place], [place] [phone], Ext. [phone] Mondays & Thursdays: 7-9 AM.  First 15 patients are seen on a first come, first serve basis.    Medicaid-accepting [facility] Providers:  Organization         Address  Phone   Notes  [facility] [address], [place] [phone] Also accepts self-pay patients.  [facility] [address] [place], [place]  [address] [place]  [phone]   [facility] [address], [place] [phone]   [facility] [address], [place] [phone]   Renaye Rakers [address], [place]   [phone] Only accepts [place] Access [place] patients after they have their name applied to their card.   Self-Pay (no insurance) in [facility]:  Organization         Address  Phone   Notes  Sickle Cell Patients, [facility] [address] [place], [place] [phone]   [facility] [address] [place], [place] [phone]   [facility] [place]  [address], [place] [phone]     [facility]/Dr. Osei-Bonsu  [address] [place] or [address], [place] [phone] Phone number for both [place] and [place] locations is the same.  [facility] [address] [place] [phone]   [facility] [address], [place] or [address] [phone]   [facility] [address], [place] [phone], phone; [phone], fax Sees patients 1st and 3rd Saturday of every month.  Must not qualify for public or private insurance (i.e. Medicaid, Medicare, [place] Health Choice, Veterans' Benefits)  Household income should be no more than 200% of the poverty level The clinic cannot treat you if you are pregnant or think you are pregnant  Sexually transmitted diseases are not treated at the clinic.    Dental Care: Organization         Address  Phone  Notes  [facility] Department of [facility] [address] [place], [place] [phone] Accepts children up to age 21 who are enrolled in [place] or [place] Health Choice; pregnant women with a Medicaid card; and children who have applied for Medicaid or [place] Health Choice, but were declined, whose parents can pay a reduced fee at time of service.  [facility] of [facility]  [address], [place] [phone] Accepts children up to age 36 who are enrolled in [place] or [place] Health Choice; pregnant women with a Medicaid card; and children who have applied for Medicaid or [place] Health Choice, but were declined, whose parents can pay a reduced fee at time of service.  [facility]  [address] [place], [place] [phone] Patients are seen by appointment only. Walk-ins are not accepted. [facility] will see patients 42 years of age and older. Monday - Tuesday (8am-5pm) Most Wednesdays (8:30-5pm) $30 per visit,  cash only  [facility]  [address], [facility] [place] [phone] Patients are seen by appointment only. Walk-ins are not accepted. [facility] will see patients 50 years of age and older. One Wednesday Evening (Monthly: Volunteer Based).  $30 per visit, cash only  [employer]  [phone] for adults; Children under age 60, call Graduate Pediatric Dentistry at [phone]. Children aged 32-14, please call [phone] to request a pediatric application.  Dental services are provided in all areas of dental care including fillings, crowns and bridges, complete and partial dentures, implants, gum treatment, root canals, and extractions. Preventive care is also provided. Treatment is provided to both adults and children. Patients are selected via a  lottery and there is often a waiting list.   [facility] [address] [place]  [phone] [email or web address]   [facility] [address] [place], [place] [phone], Ext. [phone] Second and Fourth Thursday of each month, opens at 6:30 AM; Clinic ends at 9 AM.  Patients are seen on a first-come first-served basis, and a limited number are seen during each clinic.   [facility]  [address] Ether Griffins [place], [place] [phone]   Eligibility Requirements You must have lived in [place], [place], or [place] counties for at least the last three months.   You cannot be eligible for state or federal sponsored [place], including [employer], [place], or [employer].   You generally cannot be eligible for healthcare insurance through your employer.    How to apply: Eligibility screenings are held every Tuesday and Wednesday afternoon from 1:00 pm until 4:00 pm. You do not need an appointment for the interview!  [facility] [address] [place], [place] [phone]   [facility] Health Department   [phone]   [facility] Health Department  [phone]   [facility] Health Department  [phone]    Behavioral Health Resources in the Community: Intensive Outpatient Programs Organization         Address  Phone  Notes  [facility] [address], [place], [place] [phone]   [facility] Outpatient [address], [place], [place] [phone]   [facility] [address] [place], [place]  [phone]   [facility] Mental Health [address],  [place], [place] [phone] or [phone]   Substance Abuse Resources Organization         Address  Phone  Notes  [facility]  [phone]   [facility]  [phone]   [facility] [place]  [phone]   Floydene Flock  [phone]   Residential & Outpatient Substance Abuse Program  [phone]   Psychological Services Organization         Address  Phone  Notes  [facility] Behavioral Health  [phone]   [facility] Services  [phone]   [facility] Mental Health [address] [place] [address], [place] [phone] or [phone]    Mobile Crisis Teams Organization         Address  Phone  Notes  [facility], Mobile Crisis Care Unit  [phone]   [facility]  [address] [place], [place] [phone]   Doristine Locks [address] [place] [phone]    Self-Help/Support Groups Organization         Address  Phone             Notes  [facility] [place] - variety of support groups  336- I7437963 Call for more information  Narcotics Anonymous (NA), [facility] [address], [employer] [place]  2 meetings at this location   Statistician         Address  Phone  Notes  [facility] [address],    [place]  [phone]   [facility]  [address] [place] [address], [place], [place] [phone]    [facility] [address] [place], [place] [phone] Admissions: 8am-3pm M-F  [facility] [address],    [place], [place] [phone]   [facility] [address],  [place], [place]   [facility] [address],  [place], [place]   [facility] - Intensive Outpatient [address], [place], [place]   [facility] [place].,  [place], [place] [phone] or [phone]   [facility] ([facility]) [address] [place], [place] Accepts Medicaid  [facility] [address],  [place] [phone] Substance Abuse/Addiction Treatment   [facility] Organization         Address  Phone  Notes  [facility]  [phone]   Domenic Schwab, PhD [address] Arlis Porta [place], [place]   [phone] or [phone]   Lyman Niederwald [place], [place] [phone]   [facility] [address], [place], [place] [phone] Insurance/Medicaid/sponsorship through [facility] [address]                                    [place], [place] [phone] [facility] [address], [place] [phone]    Dr. Adele Schilder  [phone]   [facility] Dept. 1) [address] [place] 2) [place] 3)  [address], [place] [phone]  [phone]   [place] [phone] or [phone] (After Hours)

## 2014-09-05 ENCOUNTER — Encounter (HOSPITAL_BASED_OUTPATIENT_CLINIC_OR_DEPARTMENT_OTHER): Payer: Self-pay | Admitting: *Deleted

## 2014-09-05 ENCOUNTER — Emergency Department (HOSPITAL_BASED_OUTPATIENT_CLINIC_OR_DEPARTMENT_OTHER)
Admission: EM | Admit: 2014-09-05 | Discharge: 2014-09-05 | Disposition: A | Discharge status: Home / Self Care | Payer: Self-pay | Attending: Emergency Medicine | Admitting: Emergency Medicine

## 2014-09-05 DIAGNOSIS — K029 Dental caries, unspecified: Secondary | ICD-10-CM | POA: Insufficient documentation

## 2014-09-05 DIAGNOSIS — K088 Other specified disorders of teeth and supporting structures: Secondary | ICD-10-CM | POA: Insufficient documentation

## 2014-09-05 DIAGNOSIS — F419 Anxiety disorder, unspecified: Secondary | ICD-10-CM | POA: Insufficient documentation

## 2014-09-05 DIAGNOSIS — Z79899 Other long term (current) drug therapy: Secondary | ICD-10-CM | POA: Insufficient documentation

## 2014-09-05 DIAGNOSIS — K0889 Other specified disorders of teeth and supporting structures: Secondary | ICD-10-CM

## 2014-09-05 DIAGNOSIS — Z87891 Personal history of nicotine dependence: Secondary | ICD-10-CM | POA: Insufficient documentation

## 2014-09-05 MED ORDER — IBUPROFEN 800 MG PO TABS
800.0000 mg | ORAL_TABLET | Freq: Once | ORAL | Status: AC
  Administered 2014-09-05: 800 mg via ORAL
  Filled 2014-09-05: qty 1

## 2014-09-05 MED ORDER — PENICILLIN V POTASSIUM 500 MG PO TABS: 1000.0000 mg | ORAL_TABLET | Freq: Two times a day (BID) | ORAL | Status: DC

## 2014-09-05 MED ORDER — IBUPROFEN 800 MG PO TABS: 800.0000 mg | ORAL_TABLET | Freq: Three times a day (TID) | ORAL | Status: DC | PRN

## 2014-09-05 NOTE — Discharge Instructions (Signed)

## 2014-09-05 NOTE — ED Provider Notes (Signed)
CSN: [id number]     Arrival date & time 09/05/14  6812 History   First MD Initiated Contact with Patient 09/05/14 [phone]     Chief Complaint  Patient presents with  . Dental Pain     (Consider location/radiation/quality/duration/timing/severity/associated sxs/prior Treatment) HPI  29 year old male presents with right upper tooth pain that started last night. States his tooth is been chipped for a while but the pain started last night. No fevers or chills. No facial swelling. Has not noticed any swelling. No trouble swallowing. Has taken Tylenol and ibuprofen. No relief. Rates his pain as severe. Does not have a dentist.  Past Medical History  Diagnosis Date  . Anxiety    History reviewed. No pertinent past surgical history. Family History  Problem Relation Age of Onset  . Hyperlipidemia Mother   . Diabetes Mother   . Stroke Mother   . Hypertension Mother   . Hypertension Father    History  Substance Use Topics  . Smoking status: Former Games developer  . Smokeless tobacco: Never Used     Comment: quit in January  . Alcohol Use: No    Review of Systems  Constitutional: Negative for fever and chills.  HENT: Positive for dental problem. Negative for facial swelling, trouble swallowing and voice change.   Respiratory: Negative for shortness of breath.   All other systems reviewed and are negative.     Allergies  Review of patient's allergies indicates no known allergies.  Home Medications   Prior to Admission medications   Medication Sig Start Date End Date Taking? Authorizing Provider  ALPRAZolam (XANAX) 0.25 MG tablet TAKE 1 TABLET BY MOUTH EVERY DAY AS NEEDED FOR ANXIETY 05/30/14  Yes Elvina Sidle, MD  ibuprofen (ADVIL,MOTRIN) 800 MG tablet Take 1 tablet (800 mg total) by mouth every 8 (eight) hours as needed for mild pain or moderate pain. 09/05/14   Pricilla Loveless, MD  penicillin v potassium (VEETID) 500 MG tablet Take 2 tablets (1,000 mg total) by mouth 2 (two) times  daily. X 7 days 09/05/14   Pricilla Loveless, MD   BP 150/88 mmHg  Pulse 77  Temp(Src) 98.4 F (36.9 C) (Oral)  Resp 18  Ht 5\' 6"  (1.676 m)  Wt 235 lb (106.595 kg)  BMI 37.95 kg/m2  SpO2 99% Physical Exam  Constitutional: He is oriented to person, place, and time. He appears well-developed and well-nourished.  HENT:  Head: Normocephalic and atraumatic.  Right Ear: External ear normal.  Left Ear: External ear normal.  Nose: Nose normal.  Mouth/Throat: Oropharynx is clear and moist. Dental caries present. No dental abscesses or uvula swelling.    Diffuse dental caries  Eyes: Right eye exhibits no discharge. Left eye exhibits no discharge.  Neck: Neck supple.  Pulmonary/Chest: Effort normal.  Abdominal: He exhibits no distension.  Neurological: He is alert and oriented to person, place, and time.  Skin: Skin is warm and dry.  Nursing note and vitals reviewed.   ED Course  Procedures (including critical care time) Labs Review Labs Reviewed - No data to display  Imaging Review No results found.   EKG Interpretation None      MDM   Final diagnoses:  Toothache    Patient with acute toothache but no signs of dental abscess or periapical abscess. No focal swelling, trismus, or oral swelling. Speaks normally without trismus. At this point is uncomplicated dental pain, will recommend Orajel, ibuprofen, and have given dental referral.    Pricilla Loveless, MD 09/05/14 1259

## 2014-09-05 NOTE — ED Notes (Signed)
C/o upper right toothache  Pain. Onset last pm.

## 2015-09-20 ENCOUNTER — Encounter (HOSPITAL_BASED_OUTPATIENT_CLINIC_OR_DEPARTMENT_OTHER): Payer: Self-pay | Admitting: Emergency Medicine

## 2015-09-20 ENCOUNTER — Emergency Department (HOSPITAL_BASED_OUTPATIENT_CLINIC_OR_DEPARTMENT_OTHER)
Admission: EM | Admit: 2015-09-20 | Discharge: 2015-09-20 | Disposition: A | Discharge status: Home / Self Care | Payer: Self-pay | Attending: Emergency Medicine | Admitting: Emergency Medicine

## 2015-09-20 DIAGNOSIS — K529 Noninfective gastroenteritis and colitis, unspecified: Secondary | ICD-10-CM | POA: Insufficient documentation

## 2015-09-20 DIAGNOSIS — Z87891 Personal history of nicotine dependence: Secondary | ICD-10-CM | POA: Insufficient documentation

## 2015-09-20 LAB — URINALYSIS, ROUTINE W REFLEX MICROSCOPIC
Bilirubin Urine: NEGATIVE
Glucose, UA: NEGATIVE mg/dL
Hgb urine dipstick: NEGATIVE
Ketones, ur: NEGATIVE mg/dL
LEUKOCYTES UA: NEGATIVE
NITRITE: NEGATIVE
Protein, ur: 30 mg/dL — AB
SPECIFIC GRAVITY, URINE: 1.028 (ref 1.005–1.030)
pH: 6.5 (ref 5.0–8.0)

## 2015-09-20 LAB — COMPREHENSIVE METABOLIC PANEL
ALK PHOS: 76 U/L (ref 38–126)
ALT: 18 U/L (ref 17–63)
ANION GAP: 9 (ref 5–15)
AST: 20 U/L (ref 15–41)
Albumin: 3.9 g/dL (ref 3.5–5.0)
BILIRUBIN TOTAL: 0.7 mg/dL (ref 0.3–1.2)
BUN: 13 mg/dL (ref 6–20)
CALCIUM: 9 mg/dL (ref 8.9–10.3)
CO2: 26 mmol/L (ref 22–32)
CREATININE: 1.22 mg/dL (ref 0.61–1.24)
Chloride: 102 mmol/L (ref 101–111)
Glucose, Bld: 104 mg/dL — ABNORMAL HIGH (ref 65–99)
Potassium: 3.7 mmol/L (ref 3.5–5.1)
Sodium: 137 mmol/L (ref 135–145)
TOTAL PROTEIN: 7.7 g/dL (ref 6.5–8.1)

## 2015-09-20 LAB — CBC
HCT: 39.8 % (ref 39.0–52.0)
HEMOGLOBIN: 13.3 g/dL (ref 13.0–17.0)
MCH: 23.5 pg — ABNORMAL LOW (ref 26.0–34.0)
MCHC: 33.4 g/dL (ref 30.0–36.0)
MCV: 70.2 fL — ABNORMAL LOW (ref 78.0–100.0)
PLATELETS: 253 10*3/uL (ref 150–400)
RBC: 5.67 MIL/uL (ref 4.22–5.81)
RDW: 16.4 % — ABNORMAL HIGH (ref 11.5–15.5)
WBC: 8.5 10*3/uL (ref 4.0–10.5)

## 2015-09-20 LAB — URINE MICROSCOPIC-ADD ON: BACTERIA UA: NONE SEEN

## 2015-09-20 LAB — LIPASE, BLOOD: Lipase: 18 U/L (ref 11–51)

## 2015-09-20 MED ORDER — ONDANSETRON 4 MG PO TBDP: 4.0000 mg | ORAL_TABLET | ORAL | Status: DC | PRN

## 2015-09-20 MED ORDER — SODIUM CHLORIDE 0.9 % IV BOLUS (SEPSIS)
1000.0000 mL | Freq: Once | INTRAVENOUS | Status: AC
  Administered 2015-09-20: 1000 mL via INTRAVENOUS

## 2015-09-20 MED ORDER — ONDANSETRON HCL 4 MG/2ML IJ SOLN
4.0000 mg | Freq: Once | INTRAMUSCULAR | Status: AC
  Administered 2015-09-20: 4 mg via INTRAVENOUS
  Filled 2015-09-20: qty 2

## 2015-09-20 NOTE — Discharge Instructions (Signed)

## 2015-09-20 NOTE — ED Notes (Signed)
Pt given ginger ale.

## 2015-09-20 NOTE — ED Provider Notes (Signed)
CSN: [id number]     Arrival date & time 09/20/15  1907 History  By signing my name below, I, Bethel Born, attest that this documentation has been prepared under the direction and in the presence of Arby Barrette, MD. Electronically Signed: Bethel Born, ED Scribe. 09/20/2015. 9:00 PM   Chief Complaint  Patient presents with  . Emesis   The history is provided by the patient. No language interpreter was used.   Derek Reed is a 30 y.o. male who presents to the Emergency Department complaining of N/V/D with onset yesterday. Pt has had 2 episodes of emesis and 4-5 episodes of diarrhea today.  Associated symptoms include mid and upper abdominal pain. Pt denies fever, lightheadedness, syncope, dysuria, and difficulty urinating. His children have been ill with similar symptoms.   Past Medical History  Diagnosis Date  . Anxiety    History reviewed. No pertinent past surgical history. Family History  Problem Relation Age of Onset  . Hyperlipidemia Mother   . Diabetes Mother   . Stroke Mother   . Hypertension Mother   . Hypertension Father    Social History  Substance Use Topics  . Smoking status: Former Games developer  . Smokeless tobacco: Never Used     Comment: quit in January  . Alcohol Use: No    Review of Systems  Constitutional: Negative for fever.  Gastrointestinal: Positive for nausea, vomiting, abdominal pain and diarrhea.  Genitourinary: Negative for dysuria and difficulty urinating.  Neurological: Negative for syncope and light-headedness.   Allergies  Review of patient's allergies indicates no known allergies.  Home Medications   Prior to Admission medications   Medication Sig Start Date End Date Taking? Authorizing Provider  ALPRAZolam (XANAX) 0.25 MG tablet TAKE 1 TABLET BY MOUTH EVERY DAY AS NEEDED FOR ANXIETY 05/30/14   Elvina Sidle, MD  ibuprofen (ADVIL,MOTRIN) 800 MG tablet Take 1 tablet (800 mg total) by mouth every 8 (eight) hours as needed for mild  pain or moderate pain. 09/05/14   Pricilla Loveless, MD  ondansetron (ZOFRAN ODT) 4 MG disintegrating tablet Take 1 tablet (4 mg total) by mouth every 4 (four) hours as needed for nausea or vomiting. 09/20/15   Arby Barrette, MD  penicillin v potassium (VEETID) 500 MG tablet Take 2 tablets (1,000 mg total) by mouth 2 (two) times daily. X 7 days 09/05/14   Pricilla Loveless, MD   BP 138/83 mmHg  Pulse 103  Temp(Src) 100.2 F (37.9 C) (Oral)  Resp 20  Ht 5\' 6"  (1.676 m)  Wt 236 lb 14.4 oz (107.457 kg)  BMI 38.25 kg/m2  SpO2 97% Physical Exam  Constitutional: He is oriented to person, place, and time. He appears well-developed and well-nourished.  HENT:  Head: Normocephalic and atraumatic.  Mucous movement pink and moist Posterior oropharynx wide and patent   Eyes: EOM are normal.  Neck: Normal range of motion.  Cardiovascular: Normal rate, regular rhythm, normal heart sounds and intact distal pulses.   Pulmonary/Chest: Effort normal and breath sounds normal. No respiratory distress.  CTAB  Abdominal: Soft. He exhibits no distension. There is no tenderness.  Mild epigastric discomfort without guarding  Musculoskeletal: Normal range of motion.  Neurological: He is alert and oriented to person, place, and time.  Skin: Skin is warm and dry.  Psychiatric: He has a normal mood and affect. Judgment normal.  Nursing note and vitals reviewed.   ED Course  Procedures (including critical care time) DIAGNOSTIC STUDIES: Oxygen Saturation is 97% on RA,  normal by  my interpretation.    COORDINATION OF CARE: 8:57 PM Discussed treatment plan which includes lab work and Zofran with pt at bedside and pt agreed to plan.  Labs Review Labs Reviewed  COMPREHENSIVE METABOLIC PANEL - Abnormal; Notable for the following:    Glucose, Bld 104 (*)    All other components within normal limits  CBC - Abnormal; Notable for the following:    MCV 70.2 (*)    MCH 23.5 (*)    RDW 16.4 (*)    All other components  within normal limits  URINALYSIS, ROUTINE W REFLEX MICROSCOPIC (NOT AT [facility]) - Abnormal; Notable for the following:    Protein, ur 30 (*)    All other components within normal limits  URINE MICROSCOPIC-ADD ON - Abnormal; Notable for the following:    Squamous Epithelial / LPF 0-5 (*)    All other components within normal limits  LIPASE, BLOOD    Imaging Review No results found. I have personally reviewed and evaluated these lab results as part of my medical decision-making.   EKG Interpretation None      MDM   Final diagnoses:  Gastroenteritis   Patient seen for vomiting and diarrheal illness. All family members have similar illness. Patient's abdominal examination is soft and nontender. He is nontoxic. Findings are most consistent with a viral gastroenteritis. At this time plan will be for Zofran when necessary. Patient is counseled to return if symptoms are worsening or changing.  Arby Barrette, MD 09/20/15 2241

## 2015-09-20 NOTE — ED Notes (Signed)
Patient reports that he is having pain in his stomach and having N/V

## 2016-01-11 ENCOUNTER — Emergency Department (HOSPITAL_BASED_OUTPATIENT_CLINIC_OR_DEPARTMENT_OTHER)
Admission: EM | Admit: 2016-01-11 | Discharge: 2016-01-11 | Disposition: A | Discharge status: Home / Self Care | Payer: Self-pay | Attending: Emergency Medicine | Admitting: Emergency Medicine

## 2016-01-11 ENCOUNTER — Encounter (HOSPITAL_BASED_OUTPATIENT_CLINIC_OR_DEPARTMENT_OTHER): Payer: Self-pay

## 2016-01-11 DIAGNOSIS — J029 Acute pharyngitis, unspecified: Secondary | ICD-10-CM | POA: Insufficient documentation

## 2016-01-11 DIAGNOSIS — Z87891 Personal history of nicotine dependence: Secondary | ICD-10-CM | POA: Insufficient documentation

## 2016-01-11 HISTORY — DX: Peritonsillar abscess: J36

## 2016-01-11 MED ORDER — IBUPROFEN 800 MG PO TABS: 800.0000 mg | ORAL_TABLET | Freq: Three times a day (TID) | ORAL | 0 refills | Status: DC

## 2016-01-11 MED ORDER — PENICILLIN G BENZATHINE 1200000 UNIT/2ML IM SUSP
1.2000 10*6.[IU] | Freq: Once | INTRAMUSCULAR | Status: AC
  Administered 2016-01-11: 1.2 10*6.[IU] via INTRAMUSCULAR
  Filled 2016-01-11: qty 2

## 2016-01-11 NOTE — ED Notes (Signed)
No adverse reactions noted to IM injection.

## 2016-01-11 NOTE — Discharge Instructions (Signed)
You have been treated for strep This is contagious for 24 hours Motrin for pain

## 2016-01-11 NOTE — ED Triage Notes (Signed)
Pt reports two days of sore throat, "pus filled tonsils", fever. Pt has history of peritonsillar abscess and states this feels the same. Uvula is swollen, bilateral tonsils are red and swollen, however right tonsil is worse with white exudate present.

## 2016-01-11 NOTE — ED Provider Notes (Signed)
[facility] Provider [facility]   CSN: [id number] Arrival date & time: 01/11/16  1123     History   Chief Complaint Chief Complaint  Patient presents with  . Sore Throat    HPI Derek Reed is a 30 y.o. male.  The patient is a 30 year old male, history of sore throat since Thursday, constant, nothing makes better, worse with swallowing, fever has defervesced spontaneously, no change in voice, no cough, no nasal drainage or congestion      Past Medical History:  Diagnosis Date  . Anxiety   . Peritonsillar abscess     Patient Active Problem List   Diagnosis Date Noted  . GAD (generalized anxiety disorder) 03/26/2014  . BMI 36.0-36.9,adult 03/26/2014    Past Surgical History:  Procedure Laterality Date  . INCISION AND DRAINAGE OF PERITONSILLAR ABCESS         Home Medications    Prior to Admission medications   Medication Sig Start Date End Date Taking? Authorizing Provider  ibuprofen (ADVIL,MOTRIN) 800 MG tablet Take 1 tablet (800 mg total) by mouth 3 (three) times daily. 01/11/16   Eber HongBrian Abdimalik Mayorquin, MD    Family History Family History  Problem Relation Age of Onset  . Hyperlipidemia Mother   . Diabetes Mother   . Stroke Mother   . Hypertension Mother   . Hypertension Father     Social History Social History  Substance Use Topics  . Smoking status: Former Games developermoker  . Smokeless tobacco: Never Used     Comment: quit in January  . Alcohol use No     Allergies   Review of patient's allergies indicates no known allergies.   Review of Systems Review of Systems  Constitutional: Positive for fever.  HENT: Positive for sore throat. Negative for congestion and rhinorrhea.      Physical Exam Updated Vital Signs BP 157/97 (BP Location: Right Arm)   Pulse 98   Temp 99.1 F (37.3 C) (Oral)   Resp 18   Ht 5\' 6"  (1.676 m)   Wt 230 lb (104.3 kg)   SpO2 97%   BMI 37.12 kg/m   Physical Exam  Constitutional: He appears well-developed and  well-nourished.  HENT:  Head: Normocephalic and atraumatic.  Mouth/Throat: Oropharyngeal exudate present.  Eyes: Conjunctivae are normal. Right eye exhibits no discharge. Left eye exhibits no discharge.  Neck:  Supple neck without lymphadenopathy, no trismus or torticollis  Pulmonary/Chest: Effort normal. No respiratory distress.  Normal phonation, no respiratory distress or increased work of breathing  Neurological: He is alert. Coordination normal.  Skin: Skin is warm and dry. No rash noted. He is not diaphoretic. No erythema.  Psychiatric: He has a normal mood and affect.  Nursing note and vitals reviewed.    ED Treatments / Results  Labs (all labs ordered are listed, but only abnormal results are displayed) Labs Reviewed - No data to display    Radiology No results found.  Procedures Procedures (including critical care time)  Medications Ordered in ED Medications  penicillin g benzathine (BICILLIN LA) 1200000 UNIT/2ML injection 1.2 Million Units (not administered)     Initial Impression / Assessment and Plan / ED Course  I have reviewed the triage vital signs and the nursing notes.  Pertinent labs & imaging results that were available during my care of the patient were reviewed by me and considered in my medical decision making (see chart for details).  Clinical Course    Exam is consistent with exudative pharyngitis, penicillin intramuscular ordered,  patient stable for discharge, no signs of peritonsillar abscess  Final Clinical Impressions(s) / ED Diagnoses   Final diagnoses:  Pharyngitis    New Prescriptions New Prescriptions   IBUPROFEN (ADVIL,MOTRIN) 800 MG TABLET    Take 1 tablet (800 mg total) by mouth 3 (three) times daily.     Eber Hong, MD 01/11/16 [phone]

## 2017-07-21 ENCOUNTER — Other Ambulatory Visit: Payer: Self-pay

## 2017-07-21 ENCOUNTER — Emergency Department (HOSPITAL_BASED_OUTPATIENT_CLINIC_OR_DEPARTMENT_OTHER)
Admission: EM | Admit: 2017-07-21 | Discharge: 2017-07-21 | Disposition: A | Discharge status: Home / Self Care | Payer: Self-pay | Attending: Emergency Medicine | Admitting: Emergency Medicine

## 2017-07-21 ENCOUNTER — Encounter (HOSPITAL_BASED_OUTPATIENT_CLINIC_OR_DEPARTMENT_OTHER): Payer: Self-pay | Admitting: Emergency Medicine

## 2017-07-21 DIAGNOSIS — R252 Cramp and spasm: Secondary | ICD-10-CM | POA: Insufficient documentation

## 2017-07-21 DIAGNOSIS — Z87891 Personal history of nicotine dependence: Secondary | ICD-10-CM | POA: Insufficient documentation

## 2017-07-21 LAB — COMPREHENSIVE METABOLIC PANEL
ALK PHOS: 95 U/L (ref 38–126)
ALT: 22 U/L (ref 17–63)
AST: 28 U/L (ref 15–41)
Albumin: 3.5 g/dL (ref 3.5–5.0)
Anion gap: 8 (ref 5–15)
BUN: 14 mg/dL (ref 6–20)
CO2: 24 mmol/L (ref 22–32)
CREATININE: 1.21 mg/dL (ref 0.61–1.24)
Calcium: 8.4 mg/dL — ABNORMAL LOW (ref 8.9–10.3)
Chloride: 104 mmol/L (ref 101–111)
GFR calc Af Amer: >60 mL/min (ref >60)
GFR calc non Af Amer: >60 mL/min (ref >60)
Glucose, Bld: 128 mg/dL — ABNORMAL HIGH (ref 65–99)
Potassium: 3.4 mmol/L — ABNORMAL LOW (ref 3.5–5.1)
SODIUM: 136 mmol/L (ref 135–145)
Total Bilirubin: 0.4 mg/dL (ref 0.3–1.2)
Total Protein: 7.1 g/dL (ref 6.5–8.1)

## 2017-07-21 MED ORDER — CYCLOBENZAPRINE HCL 10 MG PO TABS: 10.0000 mg | ORAL_TABLET | Freq: Two times a day (BID) | ORAL | 0 refills | Status: DC | PRN

## 2017-07-21 MED ORDER — IBUPROFEN 800 MG PO TABS: 800.0000 mg | ORAL_TABLET | Freq: Three times a day (TID) | ORAL | 0 refills | Status: DC | PRN

## 2017-07-21 NOTE — ED Triage Notes (Signed)
Pt c/o bilateral hand cramping since yesterday.

## 2017-07-21 NOTE — ED Provider Notes (Signed)
[facility] EMERGENCY DEPARTMENT Provider Note   CSN: [id number] Arrival date & time: 07/21/17  1945     History   Chief Complaint Chief Complaint  Patient presents with  . hand cramping    HPI Derek Reed is a 32 y.o. male.  HPI   32 year old obese male with history of general anxiety disorder presenting complaining of hand cramping.  Patient report for the past 2 days he has had pain to both of his hands.  He described pain as a cramping sensation worse at nighttime when he is working on his third shift.  He started a new job requiring repetitive movement of both hands and felt it has caused this 10 pain.  He is left-handed and the pain is more so in his left hand.  He denies any associated weakness or dropping objects.  No numbness, no wrist or elbow pain.  Denies any specific injury.  No fever or chills.  No specific treatment tried.  Does not take any medication on a regular basis such as statin medication.  Past Medical History:  Diagnosis Date  . Anxiety   . Peritonsillar abscess     Patient Active Problem List   Diagnosis Date Noted  . GAD (generalized anxiety disorder) 03/26/2014  . BMI 36.0-36.9,adult 03/26/2014    Past Surgical History:  Procedure Laterality Date  . INCISION AND DRAINAGE OF PERITONSILLAR ABCESS         Home Medications    Prior to Admission medications   Medication Sig Start Date End Date Taking? Authorizing Provider  ibuprofen (ADVIL,MOTRIN) 800 MG tablet Take 1 tablet (800 mg total) by mouth 3 (three) times daily. 01/11/16   Eber Hong, MD    Family History Family History  Problem Relation Age of Onset  . Hyperlipidemia Mother   . Diabetes Mother   . Stroke Mother   . Hypertension Mother   . Hypertension Father     Social History Social History   Tobacco Use  . Smoking status: Former Games developer  . Smokeless tobacco: Never Used  . Tobacco comment: quit in January  Substance Use Topics  . Alcohol use: No  . Drug  use: No     Allergies   Patient has no known allergies.   Review of Systems Review of Systems  Constitutional: Negative for fever.  Musculoskeletal: Positive for myalgias.  Skin: Negative for rash and wound.  Neurological: Negative for numbness.     Physical Exam Updated Vital Signs BP (!) 164/102 (BP Location: Left Arm)   Pulse 72   Temp 98.7 F (37.1 C) (Oral)   Resp 18   Ht 5\' 6"  (1.676 m)   Wt 104.3 kg (230 lb)   SpO2 97%   BMI 37.12 kg/m   Physical Exam  Constitutional: He appears well-developed and well-nourished. No distress.  HENT:  Head: Atraumatic.  Eyes: Conjunctivae are normal.  Neck: Neck supple.  Musculoskeletal: He exhibits tenderness (Hands: Mild generalized tenderness on palpation of both hands without any focal point tenderness.  Normal grip strength bilaterally with intact radial pulse bilaterally and brisk cap refill throughout.  Normal skin appearance).  Neurological: He is alert.  Skin: No rash noted.  Psychiatric: He has a normal mood and affect.  Nursing note and vitals reviewed.    ED Treatments / Results  Labs (all labs ordered are listed, but only abnormal results are displayed) Labs Reviewed  COMPREHENSIVE METABOLIC PANEL - Abnormal; Notable for the following components:  Result Value   Potassium 3.4 (*)    Glucose, Bld 128 (*)    Calcium 8.4 (*)    All other components within normal limits    EKG  EKG Interpretation None       Radiology No results found.  Procedures Procedures (including critical care time)  Medications Ordered in ED Medications - No data to display   Initial Impression / Assessment and Plan / ED Course  I have reviewed the triage vital signs and the nursing notes.  Pertinent labs & imaging results that were available during my care of the patient were reviewed by me and considered in my medical decision making (see chart for details).     BP (!) 159/105 (BP Location: Right Arm)   Pulse  86   Temp 98.7 F (37.1 C) (Oral)   Resp 18   Ht 5\' 6"  (1.676 m)   Wt 104.3 kg (230 lb)   SpO2 99%   BMI 37.12 kg/m    Final Clinical Impressions(s) / ED Diagnoses   Final diagnoses:  Cramping of hands    ED Discharge Orders        Ordered    ibuprofen (ADVIL,MOTRIN) 800 MG tablet  Every 8 hours PRN     07/21/17 2244    cyclobenzaprine (FLEXERIL) 10 MG tablet  2 times daily PRN     07/21/17 2244     Patient here with bilateral hand pain which he described as cramping.  He recently started a new job requiring a lot of hand dexterity which I suspect contribute to his pain.  No evidence of cellulitis, no injury concerning for fracture dislocation, he is neurovascular intact and no weakness.  10:43 PM Electrolytes panels are reassuring.  Rice therapy discussed.  Will prescribe ibuprofen.  Return precautions discussed.   Fayrene Helper, PA-C 07/21/17 2245    Loren Racer, MD 07/24/17 1323

## 2017-07-21 NOTE — ED Notes (Signed)
Patient stated that his left hand is cramping all the time due to his kind of work.  His right hand is sore but not cramping.

## 2017-08-17 ENCOUNTER — Encounter (HOSPITAL_BASED_OUTPATIENT_CLINIC_OR_DEPARTMENT_OTHER): Payer: Self-pay | Admitting: *Deleted

## 2017-08-17 ENCOUNTER — Emergency Department (HOSPITAL_BASED_OUTPATIENT_CLINIC_OR_DEPARTMENT_OTHER)
Admission: EM | Admit: 2017-08-17 | Discharge: 2017-08-17 | Disposition: A | Discharge status: Home / Self Care | Payer: BLUE CROSS/BLUE SHIELD | Attending: Emergency Medicine | Admitting: Emergency Medicine

## 2017-08-17 ENCOUNTER — Other Ambulatory Visit: Payer: Self-pay

## 2017-08-17 DIAGNOSIS — J020 Streptococcal pharyngitis: Secondary | ICD-10-CM | POA: Diagnosis not present

## 2017-08-17 DIAGNOSIS — Z87891 Personal history of nicotine dependence: Secondary | ICD-10-CM | POA: Insufficient documentation

## 2017-08-17 DIAGNOSIS — J0300 Acute streptococcal tonsillitis, unspecified: Secondary | ICD-10-CM

## 2017-08-17 DIAGNOSIS — J029 Acute pharyngitis, unspecified: Secondary | ICD-10-CM | POA: Diagnosis present

## 2017-08-17 DIAGNOSIS — Z79899 Other long term (current) drug therapy: Secondary | ICD-10-CM | POA: Insufficient documentation

## 2017-08-17 LAB — RAPID STREP SCREEN (MED CTR MEBANE ONLY): Streptococcus, Group A Screen (Direct): POSITIVE — AB

## 2017-08-17 MED ORDER — PENICILLIN G BENZATHINE 1200000 UNIT/2ML IM SUSP
1.2000 10*6.[IU] | Freq: Once | INTRAMUSCULAR | Status: AC
  Administered 2017-08-17: 1.2 10*6.[IU] via INTRAMUSCULAR
  Filled 2017-08-17: qty 2

## 2017-08-17 MED ORDER — DEXAMETHASONE SODIUM PHOSPHATE 10 MG/ML IJ SOLN
10.0000 mg | Freq: Once | INTRAMUSCULAR | Status: AC
  Administered 2017-08-17: 10 mg via INTRAMUSCULAR
  Filled 2017-08-17: qty 1

## 2017-08-17 MED ORDER — IBUPROFEN 800 MG PO TABS
800.0000 mg | ORAL_TABLET | Freq: Once | ORAL | Status: AC
  Administered 2017-08-17: 800 mg via ORAL
  Filled 2017-08-17: qty 1

## 2017-08-17 MED ORDER — ACETAMINOPHEN 325 MG PO TABS
650.0000 mg | ORAL_TABLET | Freq: Once | ORAL | Status: AC | PRN
  Administered 2017-08-17: 650 mg via ORAL
  Filled 2017-08-17: qty 2

## 2017-08-17 NOTE — ED Provider Notes (Signed)
[facility] EMERGENCY DEPARTMENT Provider Note   CSN: [id number] Arrival date & time: 08/17/17  1103     History   Chief Complaint Chief Complaint  Patient presents with  . Sore Throat  . Facial Swelling    HPI Derek Reed is a 32 y.o. male with a PMHx of anxiety and prior peritonsillar abscess requiring I&D, who presents to the ED with complaints of sore throat, subjective fevers, chills, and body aches since yesterday.  He also had some nausea yesterday but this has resolved.  His symptoms worsen with swallowing, and have been minimally improved with Tylenol and hot tea.  No known sick contacts.  He denies any trismus, drooling, cough, rhinorrhea, ear pain or drainage, CP, SOB, abd pain, V/D/C, hematuria, dysuria, arthralgias, numbness, tingling, focal weakness, or any other complaints at this time.   The history is provided by the patient and medical records. No language interpreter was used.  Sore Throat  This is a new problem. The current episode started yesterday. The problem occurs constantly. The problem has not changed since onset.Pertinent negatives include no chest pain, no abdominal pain and no shortness of breath. The symptoms are aggravated by swallowing. Relieved by: tylenol and tea. He has tried acetaminophen (and hot tea) for the symptoms. The treatment provided mild relief.    Past Medical History:  Diagnosis Date  . Anxiety   . Peritonsillar abscess     Patient Active Problem List   Diagnosis Date Noted  . GAD (generalized anxiety disorder) 03/26/2014  . BMI 36.0-36.9,adult 03/26/2014    Past Surgical History:  Procedure Laterality Date  . INCISION AND DRAINAGE OF PERITONSILLAR ABCESS          Home Medications    Prior to Admission medications   Medication Sig Start Date End Date Taking? Authorizing Provider  cyclobenzaprine (FLEXERIL) 10 MG tablet Take 1 tablet (10 mg total) by mouth 2 (two) times daily as needed for muscle spasms.  07/21/17   Fayrene Helper, PA-C  ibuprofen (ADVIL,MOTRIN) 800 MG tablet Take 1 tablet (800 mg total) by mouth every 8 (eight) hours as needed for moderate pain. 07/21/17   Fayrene Helper, PA-C    Family History Family History  Problem Relation Age of Onset  . Hyperlipidemia Mother   . Diabetes Mother   . Stroke Mother   . Hypertension Mother   . Hypertension Father     Social History Social History   Tobacco Use  . Smoking status: Former Games developer  . Smokeless tobacco: Never Used  . Tobacco comment: quit in January  Substance Use Topics  . Alcohol use: No  . Drug use: No     Allergies   Patient has no known allergies.   Review of Systems Review of Systems  Constitutional: Positive for chills and fever.  HENT: Positive for sore throat. Negative for drooling, ear discharge, ear pain, rhinorrhea and trouble swallowing.   Respiratory: Negative for cough and shortness of breath.   Cardiovascular: Negative for chest pain.  Gastrointestinal: Positive for nausea. Negative for abdominal pain, constipation, diarrhea and vomiting.  Genitourinary: Negative for dysuria and hematuria.  Musculoskeletal: Positive for myalgias. Negative for arthralgias.  Skin: Negative for color change.  Allergic/Immunologic: Negative for immunocompromised state.  Neurological: Negative for weakness and numbness.  Psychiatric/Behavioral: Negative for confusion.   All other systems reviewed and are negative for acute change except as noted in the HPI.    Physical Exam Updated Vital Signs BP (!) 168/126   Pulse Marland Kitchen)  115   Temp (!) 101.9 F (38.8 C) (Oral)   Resp 18   Ht 5\' 6"  (1.676 m)   Wt 104.3 kg (230 lb)   SpO2 99%   BMI 37.12 kg/m   Physical Exam  Constitutional: He is oriented to person, place, and time. He appears well-developed and well-nourished.  Non-toxic appearance. No distress.  Febrile to 101.9 in triage, sweating currently but doesn't feel very hot to touch so likely breaking the fever;  overall, not septic appearing, nontoxic, NAD  HENT:  Head: Normocephalic and atraumatic.  Nose: Nose normal.  Mouth/Throat: Uvula is midline and mucous membranes are normal. No trismus in the jaw. No uvula swelling. Oropharyngeal exudate, posterior oropharyngeal edema and posterior oropharyngeal erythema present. No tonsillar abscesses. Tonsils are 4+ on the right. Tonsils are 4+ on the left. Tonsillar exudate.  Nose clear. Oropharynx erythematous without uvular swelling or deviation, no trismus or drooling, 4+ bilateral tonsillar swelling to the point that they meet in the midline and nearly touch; +erythema, +exudates.  No evidence of PTA.   Eyes: Conjunctivae and EOM are normal. Right eye exhibits no discharge. Left eye exhibits no discharge.  Neck: Normal range of motion. Neck supple.  Cardiovascular: Regular rhythm, normal heart sounds and intact distal pulses. Tachycardia present. Exam reveals no gallop and no friction rub.  No murmur heard. Mildly tachycardic  Pulmonary/Chest: Effort normal and breath sounds normal. No respiratory distress. He has no decreased breath sounds. He has no wheezes. He has no rhonchi. He has no rales.  Abdominal: Soft. Normal appearance and bowel sounds are normal. He exhibits no distension. There is no tenderness. There is no rigidity, no rebound, no guarding, no CVA tenderness, no tenderness at McBurney's point and negative Murphy's sign.  Musculoskeletal: Normal range of motion.  Lymphadenopathy:       Head (right side): Tonsillar adenopathy present.       Head (left side): Tonsillar adenopathy present.    He has cervical adenopathy.  Tonsillar and cervical LAD bilaterally which is mildly TTP  Neurological: He is alert and oriented to person, place, and time. He has normal strength. No sensory deficit.  Skin: Skin is warm, dry and intact. No rash noted.  Psychiatric: He has a normal mood and affect.  Nursing note and vitals reviewed.    ED Treatments /  Results  Labs (all labs ordered are listed, but only abnormal results are displayed) Labs Reviewed  RAPID STREP SCREEN ([facility] ONLY) - Abnormal; Notable for the following components:      Result Value   Streptococcus, Group A Screen (Direct) POSITIVE (*)    All other components within normal limits    EKG None  Radiology No results found.  Procedures Procedures (including critical care time)  Medications Ordered in ED Medications  acetaminophen (TYLENOL) tablet 650 mg (650 mg Oral Given 08/17/17 1116)  ibuprofen (ADVIL,MOTRIN) tablet 800 mg (800 mg Oral Given 08/17/17 1250)  penicillin g benzathine (BICILLIN LA) 1200000 UNIT/2ML injection 1.2 Million Units (1.2 Million Units Intramuscular Given 08/17/17 1251)  dexamethasone (DECADRON) injection 10 mg (10 mg Intramuscular Given 08/17/17 1250)     Initial Impression / Assessment and Plan / ED Course  I have reviewed the triage vital signs and the nursing notes.  Pertinent labs & imaging results that were available during my care of the patient were reviewed by me and considered in my medical decision making (see chart for details).     31 y.o. male here with  sore throat x1 day and fever. Had some nausea earlier but this resolved. Also with body aches. On exam, febrile to 101.9 on arrival, currently sweating so probably breaking his fever because he doesn't feel very hot to touch; tonsils 4+ bilaterally which are nearly touching in the midline, +exudates +erythema, no evidence of PTA, pt handling secretions well and uvula midline. B/l tonsillar and cervical LAD which is TTP. Mildly tachycardic likely from fever. RST done in triage is positive, as expected. Pt doesn't look septic, and overall appears fairly well. Strep tonsillitis/pharyngitis matches his symptoms and presentation. Will give decadron to help with tonsillar and lymph node swelling, and pain; and give PCN to treat strep. Will also give ibuprofen to continue managing his fever.  Advised OTC remedies for symptomatic relief, and close PCP f/up in 5-7 days for recheck. Strict return precautions advised. I explained the diagnosis and have given explicit precautions to return to the ER including for any other new or worsening symptoms. The patient understands and accepts the medical plan as it's been dictated and I have answered their questions. Discharge instructions concerning home care and prescriptions have been given. The patient is STABLE and is discharged to home in good condition.    Final Clinical Impressions(s) / ED Diagnoses   Final diagnoses:  Strep tonsillitis  Sore throat  Strep pharyngitis    ED Discharge Orders    [address], [place], [place] 08/17/17 1259    Charlynne Pander, MD 08/18/17 0700

## 2017-08-17 NOTE — Discharge Instructions (Signed)
You have strep throat. You were treated with an antibiotic today, which will treat your strep throat. You were also given a steroid to help bring the swelling in your tonsils down. Continue to stay well-hydrated. Gargle warm salt water and spit it out and use chloraseptic spray as needed for sore throat. Continue to alternate between Tylenol and Ibuprofen for pain or fever. May consider over-the-counter Benadryl or other antihistamine to decrease secretions and for help with your symptoms. Follow up with your primary care doctor in 5-7 days for recheck of ongoing symptoms. Return to emergency department for emergent changing or worsening of symptoms.

## 2017-08-17 NOTE — ED Triage Notes (Signed)
Sore throat and swelling of his neck and face since yesterday.

## 2019-02-15 ENCOUNTER — Ambulatory Visit: Payer: Self-pay | Admitting: *Deleted

## 2019-02-15 ENCOUNTER — Emergency Department (HOSPITAL_BASED_OUTPATIENT_CLINIC_OR_DEPARTMENT_OTHER): Payer: Self-pay

## 2019-02-15 ENCOUNTER — Other Ambulatory Visit: Payer: Self-pay

## 2019-02-15 ENCOUNTER — Encounter (HOSPITAL_BASED_OUTPATIENT_CLINIC_OR_DEPARTMENT_OTHER): Payer: Self-pay | Admitting: *Deleted

## 2019-02-15 ENCOUNTER — Emergency Department (HOSPITAL_BASED_OUTPATIENT_CLINIC_OR_DEPARTMENT_OTHER)
Admission: EM | Admit: 2019-02-15 | Discharge: 2019-02-15 | Disposition: A | Discharge status: Home / Self Care | Payer: Self-pay | Attending: Emergency Medicine | Admitting: Emergency Medicine

## 2019-02-15 DIAGNOSIS — Z20828 Contact with and (suspected) exposure to other viral communicable diseases: Secondary | ICD-10-CM | POA: Insufficient documentation

## 2019-02-15 DIAGNOSIS — R05 Cough: Secondary | ICD-10-CM | POA: Insufficient documentation

## 2019-02-15 DIAGNOSIS — R0981 Nasal congestion: Secondary | ICD-10-CM | POA: Insufficient documentation

## 2019-02-15 DIAGNOSIS — R059 Cough, unspecified: Secondary | ICD-10-CM

## 2019-02-15 DIAGNOSIS — Z87891 Personal history of nicotine dependence: Secondary | ICD-10-CM | POA: Insufficient documentation

## 2019-02-15 DIAGNOSIS — R062 Wheezing: Secondary | ICD-10-CM | POA: Insufficient documentation

## 2019-02-15 DIAGNOSIS — R509 Fever, unspecified: Secondary | ICD-10-CM | POA: Insufficient documentation

## 2019-02-15 DIAGNOSIS — M791 Myalgia, unspecified site: Secondary | ICD-10-CM | POA: Insufficient documentation

## 2019-02-15 DIAGNOSIS — R0602 Shortness of breath: Secondary | ICD-10-CM | POA: Insufficient documentation

## 2019-02-15 LAB — CBC WITH DIFFERENTIAL/PLATELET
Abs Immature Granulocytes: 0.02 10*3/uL (ref 0.00–0.07)
Basophils Absolute: 0 10*3/uL (ref 0.0–0.1)
Basophils Relative: 1 %
Eosinophils Absolute: 0.3 10*3/uL (ref 0.0–0.5)
Eosinophils Relative: 4 %
HCT: 39.8 % (ref 39.0–52.0)
Hemoglobin: 12.4 g/dL — ABNORMAL LOW (ref 13.0–17.0)
Immature Granulocytes: 0 %
Lymphocytes Relative: 41 %
Lymphs Abs: 3.2 10*3/uL (ref 0.7–4.0)
MCH: 22.6 pg — ABNORMAL LOW (ref 26.0–34.0)
MCHC: 31.2 g/dL (ref 30.0–36.0)
MCV: 72.5 fL — ABNORMAL LOW (ref 80.0–100.0)
Monocytes Absolute: 0.7 10*3/uL (ref 0.1–1.0)
Monocytes Relative: 9 %
Neutro Abs: 3.6 10*3/uL (ref 1.7–7.7)
Neutrophils Relative %: 45 %
Platelets: 260 10*3/uL (ref 150–400)
RBC: 5.49 MIL/uL (ref 4.22–5.81)
RDW: 15.7 % — ABNORMAL HIGH (ref 11.5–15.5)
WBC: 7.9 10*3/uL (ref 4.0–10.5)
nRBC: 0 % (ref 0.0–0.2)

## 2019-02-15 LAB — BASIC METABOLIC PANEL
Anion gap: 11 (ref 5–15)
BUN: 14 mg/dL (ref 6–20)
CO2: 22 mmol/L (ref 22–32)
Calcium: 8.6 mg/dL — ABNORMAL LOW (ref 8.9–10.3)
Chloride: 104 mmol/L (ref 98–111)
Creatinine, Ser: 1.59 mg/dL — ABNORMAL HIGH (ref 0.61–1.24)
GFR calc Af Amer: >60 mL/min (ref >60)
GFR calc non Af Amer: 55 mL/min — ABNORMAL LOW (ref >60)
Glucose, Bld: 100 mg/dL — ABNORMAL HIGH (ref 70–99)
Potassium: 2.9 mmol/L — ABNORMAL LOW (ref 3.5–5.1)
Sodium: 137 mmol/L (ref 135–145)

## 2019-02-15 MED ORDER — BENZONATATE 100 MG PO CAPS: 100.0000 mg | ORAL_CAPSULE | Freq: Three times a day (TID) | ORAL | 0 refills | Status: DC | PRN

## 2019-02-15 MED ORDER — AZITHROMYCIN 250 MG PO TABS
500.0000 mg | ORAL_TABLET | Freq: Once | ORAL | Status: AC
  Administered 2019-02-15: 500 mg via ORAL
  Filled 2019-02-15: qty 2

## 2019-02-15 MED ORDER — AZITHROMYCIN 250 MG PO TABS: 250.0000 mg | ORAL_TABLET | Freq: Every day | ORAL | 0 refills | Status: AC

## 2019-02-15 MED ORDER — ACETAMINOPHEN 325 MG PO TABS
650.0000 mg | ORAL_TABLET | Freq: Once | ORAL | Status: AC
  Administered 2019-02-15: 650 mg via ORAL
  Filled 2019-02-15: qty 2

## 2019-02-15 NOTE — Telephone Encounter (Signed)
Patient is experiencing SOB, hard to catch breath at times, cough- couple days. Today it feels like body is fatigued.  Reason for Disposition . MODERATE difficulty breathing (e.g., speaks in phrases, SOB even at rest, pulse 100-120)  Answer Assessment - Initial Assessment Questions 1. COVID-19 DIAGNOSIS: "Who made your Coronavirus (COVID-19) diagnosis?" "Was it confirmed by a positive lab test?" If not diagnosed by a HCP, ask "Are there lots of cases (community spread) where you live?" (See public health department website, if unsure)     Symptoms only 2. ONSET: "When did the COVID-19 symptoms start?"      3-4 days ago 3. WORST SYMPTOM: "What is your worst symptom?" (e.g., cough, fever, shortness of breath, muscle aches)     cough 4. COUGH: "Do you have a cough?" If so, ask: "How bad is the cough?"       Yes- coughing makes it hard to breath 5. FEVER: "Do you have a fever?" If so, ask: "What is your temperature, how was it measured, and when did it start?"     no 6. RESPIRATORY STATUS: "Describe your breathing?" (e.g., shortness of breath, wheezing, unable to speak)      SOB sometimes when sitting 7. BETTER-SAME-WORSE: "Are you getting better, staying the same or getting worse compared to yesterday?"  If getting worse, ask, "In what way?"     Getting worse- fatigue 8. HIGH RISK DISEASE: "Do you have any chronic medical problems?" (e.g., asthma, heart or lung disease, weak immune system, etc.)     no 9. PREGNANCY: "Is there any chance you are pregnant?" "When was your last menstrual period?"     n/a 10. OTHER SYMPTOMS: "Do you have any other symptoms?"  (e.g., chills, fatigue, headache, loss of smell or taste, muscle pain, sore throat)       Sore throat- irritation  Protocols used: CORONAVIRUS (COVID-19) DIAGNOSED OR SUSPECTED-A-AH

## 2019-02-15 NOTE — ED Provider Notes (Signed)
[facility] EMERGENCY DEPARTMENT Provider Note   CSN: [id number] Arrival date & time: 02/15/19  1332     History   Chief Complaint Chief Complaint  Patient presents with  . Cough    HPI Derek Reed is a 33 y.o. male with past medical history of anxiety, peritonsillar abscess that required drainage presents to emergency department today with chief complaint of cough and shortness of breath x3 days. He reports cough is nonproductive.  He has shortness of breath at rest and with exertion.  He also has associated generalized body aches.  He tried drinking hot tea and taking cough drops this morning without symptom relief.  He denies any sick contacts, but does work at [employer] so could have been exposed to covid unknowingly. He also reports his daughters attended daycare x2 weeks ago and they were informed one of the teachers tested positive. He had his daughters taken out of the class. He is unsure if he has had fevers at home but thinks he has. He denies chills, congestion, anorexia, chest pain, abdominal pain, nausea, vomiting, diarrhea.   Past Medical History:  Diagnosis Date  . Anxiety   . Peritonsillar abscess     Patient Active Problem List   Diagnosis Date Noted  . GAD (generalized anxiety disorder) 03/26/2014  . BMI 36.0-36.9,adult 03/26/2014    Past Surgical History:  Procedure Laterality Date  . INCISION AND DRAINAGE OF PERITONSILLAR ABCESS          Home Medications    Prior to Admission medications   Medication Sig Start Date End Date Taking? Authorizing Provider  azithromycin (ZITHROMAX) 250 MG tablet Take 1 tablet (250 mg total) by mouth daily for 4 days. 02/15/19 02/19/19  Anas Reister E, PA-C  benzonatate (TESSALON) 100 MG capsule Take 1 capsule (100 mg total) by mouth every 8 (eight) hours as needed for cough. 02/15/19   Rushil Kimbrell E, PA-C  cyclobenzaprine (FLEXERIL) 10 MG tablet Take 1 tablet (10 mg total) by mouth 2 (two) times daily as  needed for muscle spasms. 07/21/17   Fayrene Helper, PA-C  ibuprofen (ADVIL,MOTRIN) 800 MG tablet Take 1 tablet (800 mg total) by mouth every 8 (eight) hours as needed for moderate pain. 07/21/17   Fayrene Helper, PA-C    Family History Family History  Problem Relation Age of Onset  . Hyperlipidemia Mother   . Diabetes Mother   . Stroke Mother   . Hypertension Mother   . Hypertension Father     Social History Social History   Tobacco Use  . Smoking status: Former Games developer  . Smokeless tobacco: Never Used  . Tobacco comment: quit in January  Substance Use Topics  . Alcohol use: No  . Drug use: No     Allergies   Patient has no known allergies.   Review of Systems Review of Systems  Constitutional: Positive for fever. Negative for appetite change, chills and unexpected weight change.  HENT: Positive for congestion. Negative for facial swelling, rhinorrhea, sinus pressure and sore throat.   Eyes: Negative for pain and redness.  Respiratory: Positive for cough and shortness of breath. Negative for wheezing.   Cardiovascular: Negative for chest pain and palpitations.  Gastrointestinal: Negative for abdominal pain, constipation, diarrhea, nausea and vomiting.  Genitourinary: Negative for dysuria.  Musculoskeletal: Positive for arthralgias. Negative for back pain, myalgias and neck pain.  Skin: Negative for rash and wound.  Neurological: Negative for dizziness, syncope, weakness, numbness and headaches.  Psychiatric/Behavioral: Negative for confusion.  Physical Exam Updated Vital Signs BP (!) 140/99   Pulse (!) 59   Temp 98.2 F (36.8 C)   Resp 18   Ht 5\' 6"  (1.676 m)   Wt 106.6 kg   SpO2 100%   BMI 37.93 kg/m   Physical Exam Vitals signs and nursing note reviewed.  Constitutional:      General: He is not in acute distress.    Appearance: He is not ill-appearing.     Comments: Pt with frequent nonproductive cough during exam  HENT:     Head: Normocephalic and  atraumatic.     Right Ear: Tympanic membrane and external ear normal.     Left Ear: Tympanic membrane and external ear normal.     Nose: Nose normal.     Mouth/Throat:     Mouth: Mucous membranes are moist.     Pharynx: Oropharynx is clear.     Comments: No erythema to oropharynx, no edema, no exudate, no tonsillar swelling, voice normal, neck supple without lymphadenopathy  Eyes:     General: No scleral icterus.       Right eye: No discharge.        Left eye: No discharge.     Extraocular Movements: Extraocular movements intact.     Conjunctiva/sclera: Conjunctivae normal.     Pupils: Pupils are equal, round, and reactive to light.  Neck:     Musculoskeletal: Normal range of motion.     Vascular: No JVD.  Cardiovascular:     Rate and Rhythm: Normal rate and regular rhythm.     Pulses: Normal pulses.          Radial pulses are 2+ on the right side and 2+ on the left side.     Heart sounds: Normal heart sounds.  Pulmonary:     Breath sounds: Wheezing present.     Comments: Wheezing heard in bilateral anterior lung fields. Symmetric chest rise. No rales, or rhonchi. SpO2 is 100% on room air. Abdominal:     Comments: Abdomen is soft, non-distended, and non-tender in all quadrants. No rigidity, no guarding. No peritoneal signs.  Musculoskeletal: Normal range of motion.  Skin:    General: Skin is warm and dry.     Capillary Refill: Capillary refill takes less than 2 seconds.  Neurological:     Mental Status: He is oriented to person, place, and time.     GCS: GCS eye subscore is 4. GCS verbal subscore is 5. GCS motor subscore is 6.     Comments: Fluent speech, no facial droop.  Psychiatric:        Behavior: Behavior normal.      ED Treatments / Results  Labs (all labs ordered are listed, but only abnormal results are displayed) Labs Reviewed  CBC WITH DIFFERENTIAL/PLATELET - Abnormal; Notable for the following components:      Result Value   Hemoglobin 12.4 (*)    MCV  72.5 (*)    MCH 22.6 (*)    RDW 15.7 (*)    All other components within normal limits  BASIC METABOLIC PANEL - Abnormal; Notable for the following components:   Potassium 2.9 (*)    Glucose, Bld 100 (*)    Creatinine, Ser 1.59 (*)    Calcium 8.6 (*)    GFR calc non Af Amer 55 (*)    All other components within normal limits  SARS CORONAVIRUS 2 (TAT 6-24 HRS)    EKG EKG Interpretation  Date/Time:  Wednesday February 15 2019 15:34:56 EDT Ventricular Rate:  96 PR Interval:    QRS Duration: 89 QT Interval:  372 QTC Calculation: 471 R Axis:   -14 Text Interpretation:  Sinus tachycardia Multiform ventricular premature complexes Probable left atrial enlargement LVH with secondary repolarization abnormality Borderline prolonged QT interval When compared to prior, new t wave inversion in lead V6.  No STEMI Confirmed by Theda Belfast ([id number]) on 02/15/2019 3:57:47 PM Also confirmed by Theda Belfast ([id number]), editor Elita Quick [phone])  on 02/16/2019 2:10:03 PM   Radiology Dg Chest Portable 1 View  Result Date: 02/15/2019 CLINICAL DATA:  Cough, shortness of breath for 3 days EXAM: PORTABLE CHEST 1 VIEW COMPARISON:  05/28/2014 FINDINGS: Bilateral perihilar interstitial thickening with prominence of the central pulmonary vasculature. No acute osseous abnormality. No aggressive osseous lesion. IMPRESSION: Bilateral perihilar interstitial thickening and prominence of the central pulmonary vasculature most concerning for interstitial edema versus interstitial infection. Electronically Signed   By: Elige Ko   On: 02/15/2019 15:48    Procedures Procedures (including critical care time)  Medications Ordered in ED Medications  acetaminophen (TYLENOL) tablet 650 mg (650 mg Oral Given 02/15/19 1506)  azithromycin (ZITHROMAX) tablet 500 mg (500 mg Oral Given 02/15/19 1743)     Initial Impression / Assessment and Plan / ED Course  I have reviewed the triage vital signs and the nursing  notes.  Pertinent labs & imaging results that were available during my care of the patient were reviewed by me and considered in my medical decision making (see chart for details).  Patient seen and examined. Patient nontoxic appearing, in no apparent distress, he is afebrile, no hypoxia. On exam he has nonproductive cough, lungs are clear to auscultation in all fields, normal work of breathing. During exam pt's heart rate ranged from 42-89 while at rest. He denies chest pain, palpitations. Patient on cardiac monitor. Work up initiated with labs, ekg, chest xray and 24 hour covid test.  EKG shows new t wave inversion in v6 only, multiform PVCs, no stemi. Chest xray viewed by me with impression: bilateral perihilar interstitial thickening and prominence of the central pulmonary vasculature most concerning for interstitial edema versus interstitial infection. Labs are significant for no leukocytosis, hypokalemia of 2.9, creatinine of 1.59 which is up from pt's baseline on chart view. Pt declines IVF, has been drinking multiple cups of water while in ED. Discussed findings with ED attending Dr. Rush Landmark, who agrees with plan to treat for possible pneumonia given fevers at home and possible interstitial infection. Pt ambulated without respiratory distress, SpO2 94-96% on room air. Pt aware he will need to self quarantine until he has the test result.  The patient appears reasonably screened and/or stabilized for discharge and I doubt any other medical condition or other [facility] requiring further screening, evaluation, or treatment in the ED at this time prior to discharge. The patient is safe for discharge with strict return precautions discussed. Recommend pcp follow up to have blood pressure and creatinine rechecked.   Dorie Haxton was evaluated in Emergency Department on 02/16/2019 for the symptoms described in the history of present illness. He was evaluated in the context of the global COVID-19 pandemic, which  necessitated consideration that the patient might be at risk for infection with the SARS-CoV-2 virus that causes COVID-19. Institutional protocols and algorithms that pertain to the evaluation of patients at risk for COVID-19 are in a state of rapid change based on information released by regulatory bodies including the CDC and federal and state  organizations. These policies and algorithms were followed during the patient's care in the ED.  Portions of this note were generated with Lobbyist. Dictation errors may occur despite best attempts at proofreading.    7:39 PM 02/16/19 Looking through pt's chart I noticed prescription for K-Dur was not sent to pharmacy. I planned on discharging with short course. I called the patient to inform him I sent the prescription to his pharmacy and he will need to follow up with pcp to have potassium rechecked when prescription is finished. He had no further questions.    Final Clinical Impressions(s) / ED Diagnoses   Final diagnoses:  Cough  Shortness of breath with exposure to COVID-19 virus    ED Discharge Orders         Ordered    azithromycin (ZITHROMAX) 250 MG tablet  Daily     02/15/19 1736    benzonatate (TESSALON) 100 MG capsule  Every 8 hours PRN     02/15/19 1738           Cherre Robins, PA-C 02/16/19 1943    Tegeler, Gwenyth Allegra, MD 02/17/19 [phone]

## 2019-02-15 NOTE — Discharge Instructions (Addendum)
Our doctors and staff appreciate your choosing [place] for your emergency medical care needs. We are here to serve you.  You were tested today for COVID.  The results should be back in 1 day.  You will be called if the test is positive.  You should self quarantine until you have the test result.  If it is positive you will need to continue self quarantine.  Prescription has been sent to pharmacy for azithromycin.  This is a medication to treat pneumonia.  Your chest x-ray showed possible pneumonia infection. Prescription  also sent for Tessalon.  This is a cough medicine.  You can take this as needed for cough.    -It is very important that you follow-up with the [facility] health clinic or another primary care doctor.  Your blood pressure today was high.  You need to have this rechecked within 1 week. -Your kidney function was also elevated today.  You need to increase your fluid intake as we discussed.  Please follow-up with a doctor to have this lab rechecked within 1 week.  RESOURCE GUIDE  Chronic Pain Problems: Contact [facility]  [phone] Patients need to be referred by their primary care doctor.  Insufficient Money for Medicine: Contact United Way:  call "211" or [place] [phone].  No Primary Care Doctor: Call [facility] Connect  [phone] - can help you locate a primary care doctor that  accepts your insurance, provides certain services, etc. Physician Referral Service- [phone]  Agencies that provide inexpensive medical care: [facility]  [facility]  [phone] [facility]  [phone] [facility]  [phone] [facility]  [phone] [facility]  [phone]  [place] Providers: [facility]- [address]  [phone], Mon-Fri 9am-7pm, Sat 9am-1pm [place], [address] [place]  [place], [address] [place]  [place]- [address]  [place], [address], [phone]  Only accepts [place] patients after they have their name  applied to their card  Self Pay (no insurance) in [facility]: Sickle Cell Patients: Dr Kevan Ny, [facility]  [place], [facility]- [place]  [facility] [place]- [address], [facility]- see information above (Speak to [facility] if you do not have insurance)       -  [facility]- [place], [place],  [place]       -  [place] [address], [phone]       -  Dr Vista Lawman-  [address], [place], [facility]- [address] [phone]       -  [facility]- [address], [place], also [address], [phone]       -    [facility]- [address], [place], 1st & 3rd Saturday   every month, 10am-1pm  1) Find a Tax adviser  and Pay Out of Pocket Although you won't have to find out who is covered by your insurance plan, it is a good idea to ask around and get recommendations. You will then need to call the office and see if the doctor you have chosen will accept you as a new patient and what types of options they offer for patients who are self-pay. Some doctors offer discounts or will set up payment plans for their patients who do not have insurance, but you will need to ask so you aren't surprised when you get to your appointment.  2) Contact Your Local Health Department Not all health departments have doctors that can see patients for sick visits, but many do, so it is worth a call to see if yours does. If you don't know where your  local health department is, you can check in your phone book. The CDC also has a tool to help you locate your state's health department, and many state websites also have listings of all of their local health departments.  3) Find a Walk-in Clinic If your illness is not likely to be very severe or complicated, you may want to try a walk in clinic. These are popping up all over the country in pharmacies, drugstores, and shopping centers. They're usually staffed by nurse practitioners or physician assistants that have been trained to treat common illnesses and complaints. They're usually fairly quick and inexpensive. However, if you have serious medical issues or chronic medical problems, these are probably not your best option  STD Testing [facility] Department of [facility] [place], STD Clinic, [address], [place], phone [phone] or [phone].  Monday - Friday, call for an appointment. [facility] Department of [employer], STD Clinic, [place] [address], [place], phone [phone] or [phone].  Monday - Friday, call for an appointment.   Behavioral Health Services Substance Abuse Resources: [facility] Drug [facility]  [phone] [facility] [phone] [facility] [phone] Floydene Flock [phone] Residential & Outpatient Substance Abuse Program  [phone]  Psychological Services: [facility]  [phone] [facility]  [phone] [facility], [phone] [place]. [address], [place], ACCESS LINE: [phone] or [phone], Ent[employer]ur[employer]an.[employer].za  Dental Assistance  If unable to pay or uninsured, contact:  [facility] or [facility] to become qualified for the adult dental clinic.  Patients with Medicaid: [facility] [phone] [address], [phone] [address] [phone]  If unable to pay, or uninsured,  contact [facility] [phone]) or [facility] Department [phone] in [place], [phone] in [facility]) to become qualified for the adult dental clinic   Other Low-Cost Community Dental Services: [facility]- [address] [place], [place], [place], [address], [phone], [address]. [phone], 2nd and 4th Thursday of the month at 6:30am.  10 clients each day by appointment, can sometimes see walk-in patients if someone does not show for an appointment. [facility]- [address] [place], [place], [address], [phone] [facility] [address] [place], [place], [address], [phone] [facility] Health Department- [phone] [facility] Health Department- [phone] [facility] [phone]

## 2019-02-15 NOTE — ED Triage Notes (Signed)
Pt c/o cough and SOB x 3 days 

## 2019-02-15 NOTE — ED Notes (Signed)
PT ambulated without difficulty. HR 112, Sats between 94-96% on RA.

## 2019-02-16 LAB — SARS CORONAVIRUS 2 (TAT 6-24 HRS): SARS Coronavirus 2: NEGATIVE

## 2019-02-23 ENCOUNTER — Inpatient Hospital Stay (HOSPITAL_BASED_OUTPATIENT_CLINIC_OR_DEPARTMENT_OTHER)
Admission: EM | Admit: 2019-02-23 | Discharge: 2019-02-26 | DRG: 286 | Disposition: A | Discharge status: Home / Self Care | Payer: Self-pay | Attending: Internal Medicine | Admitting: Internal Medicine

## 2019-02-23 ENCOUNTER — Emergency Department (HOSPITAL_BASED_OUTPATIENT_CLINIC_OR_DEPARTMENT_OTHER): Payer: Self-pay

## 2019-02-23 ENCOUNTER — Other Ambulatory Visit: Payer: Self-pay

## 2019-02-23 ENCOUNTER — Encounter (HOSPITAL_BASED_OUTPATIENT_CLINIC_OR_DEPARTMENT_OTHER): Payer: Self-pay

## 2019-02-23 DIAGNOSIS — I130 Hypertensive heart and chronic kidney disease with heart failure and stage 1 through stage 4 chronic kidney disease, or unspecified chronic kidney disease: Principal | ICD-10-CM | POA: Diagnosis present

## 2019-02-23 DIAGNOSIS — Z8349 Family history of other endocrine, nutritional and metabolic diseases: Secondary | ICD-10-CM

## 2019-02-23 DIAGNOSIS — R778 Other specified abnormalities of plasma proteins: Secondary | ICD-10-CM | POA: Diagnosis present

## 2019-02-23 DIAGNOSIS — I160 Hypertensive urgency: Secondary | ICD-10-CM | POA: Diagnosis present

## 2019-02-23 DIAGNOSIS — I428 Other cardiomyopathies: Secondary | ICD-10-CM | POA: Diagnosis present

## 2019-02-23 DIAGNOSIS — F419 Anxiety disorder, unspecified: Secondary | ICD-10-CM | POA: Diagnosis present

## 2019-02-23 DIAGNOSIS — Z8249 Family history of ischemic heart disease and other diseases of the circulatory system: Secondary | ICD-10-CM

## 2019-02-23 DIAGNOSIS — N182 Chronic kidney disease, stage 2 (mild): Secondary | ICD-10-CM | POA: Diagnosis present

## 2019-02-23 DIAGNOSIS — I5022 Chronic systolic (congestive) heart failure: Secondary | ICD-10-CM

## 2019-02-23 DIAGNOSIS — I10 Essential (primary) hypertension: Secondary | ICD-10-CM | POA: Diagnosis present

## 2019-02-23 DIAGNOSIS — I5021 Acute systolic (congestive) heart failure: Secondary | ICD-10-CM | POA: Diagnosis present

## 2019-02-23 DIAGNOSIS — Z20828 Contact with and (suspected) exposure to other viral communicable diseases: Secondary | ICD-10-CM | POA: Diagnosis present

## 2019-02-23 DIAGNOSIS — Z823 Family history of stroke: Secondary | ICD-10-CM

## 2019-02-23 DIAGNOSIS — I509 Heart failure, unspecified: Secondary | ICD-10-CM

## 2019-02-23 DIAGNOSIS — Z6836 Body mass index (BMI) 36.0-36.9, adult: Secondary | ICD-10-CM

## 2019-02-23 DIAGNOSIS — R7989 Other specified abnormal findings of blood chemistry: Secondary | ICD-10-CM | POA: Diagnosis present

## 2019-02-23 DIAGNOSIS — Z87891 Personal history of nicotine dependence: Secondary | ICD-10-CM

## 2019-02-23 DIAGNOSIS — Z833 Family history of diabetes mellitus: Secondary | ICD-10-CM

## 2019-02-23 DIAGNOSIS — R7303 Prediabetes: Secondary | ICD-10-CM | POA: Diagnosis present

## 2019-02-23 DIAGNOSIS — E876 Hypokalemia: Secondary | ICD-10-CM | POA: Diagnosis present

## 2019-02-23 DIAGNOSIS — Z79899 Other long term (current) drug therapy: Secondary | ICD-10-CM

## 2019-02-23 LAB — CBC WITH DIFFERENTIAL/PLATELET
Abs Immature Granulocytes: 0.02 10*3/uL (ref 0.00–0.07)
Basophils Absolute: 0 10*3/uL (ref 0.0–0.1)
Basophils Relative: 0 %
Eosinophils Absolute: 0.3 10*3/uL (ref 0.0–0.5)
Eosinophils Relative: 4 %
HCT: 38.9 % — ABNORMAL LOW (ref 39.0–52.0)
Hemoglobin: 12.1 g/dL — ABNORMAL LOW (ref 13.0–17.0)
Immature Granulocytes: 0 %
Lymphocytes Relative: 37 %
Lymphs Abs: 2.8 10*3/uL (ref 0.7–4.0)
MCH: 22.6 pg — ABNORMAL LOW (ref 26.0–34.0)
MCHC: 31.1 g/dL (ref 30.0–36.0)
MCV: 72.6 fL — ABNORMAL LOW (ref 80.0–100.0)
Monocytes Absolute: 0.7 10*3/uL (ref 0.1–1.0)
Monocytes Relative: 9 %
Neutro Abs: 3.7 10*3/uL (ref 1.7–7.7)
Neutrophils Relative %: 50 %
Platelets: 280 10*3/uL (ref 150–400)
RBC: 5.36 MIL/uL (ref 4.22–5.81)
RDW: 15.8 % — ABNORMAL HIGH (ref 11.5–15.5)
Smear Review: NORMAL
WBC: 7.5 10*3/uL (ref 4.0–10.5)
nRBC: 0 % (ref 0.0–0.2)

## 2019-02-23 LAB — BASIC METABOLIC PANEL
Anion gap: 11 (ref 5–15)
BUN: 16 mg/dL (ref 6–20)
CO2: 23 mmol/L (ref 22–32)
Calcium: 8.7 mg/dL — ABNORMAL LOW (ref 8.9–10.3)
Chloride: 106 mmol/L (ref 98–111)
Creatinine, Ser: 1.39 mg/dL — ABNORMAL HIGH (ref 0.61–1.24)
GFR calc Af Amer: >60 mL/min (ref >60)
GFR calc non Af Amer: >60 mL/min (ref >60)
Glucose, Bld: 95 mg/dL (ref 70–99)
Potassium: 3.1 mmol/L — ABNORMAL LOW (ref 3.5–5.1)
Sodium: 140 mmol/L (ref 135–145)

## 2019-02-23 LAB — TROPONIN I (HIGH SENSITIVITY)
Troponin I (High Sensitivity): 156 ng/L (ref <18)
Troponin I (High Sensitivity): 153 ng/L (ref <18)

## 2019-02-23 LAB — BRAIN NATRIURETIC PEPTIDE: B Natriuretic Peptide: 882.4 pg/mL — ABNORMAL HIGH (ref 0.0–100.0)

## 2019-02-23 MED ORDER — AMLODIPINE BESYLATE 5 MG PO TABS
5.0000 mg | ORAL_TABLET | Freq: Once | ORAL | Status: AC
  Administered 2019-02-23: 19:00:00 5 mg via ORAL
  Filled 2019-02-23: qty 1

## 2019-02-23 MED ORDER — POTASSIUM CHLORIDE CRYS ER 20 MEQ PO TBCR
40.0000 meq | EXTENDED_RELEASE_TABLET | ORAL | Status: DC
  Administered 2019-02-23 – 2019-02-24 (×2): 40 meq via ORAL
  Filled 2019-02-23 (×2): qty 2

## 2019-02-23 MED ORDER — IPRATROPIUM-ALBUTEROL 0.5-2.5 (3) MG/3ML IN SOLN: 3.0000 mL | Freq: Once | RESPIRATORY_TRACT | Status: DC

## 2019-02-23 MED ORDER — ALBUTEROL SULFATE HFA 108 (90 BASE) MCG/ACT IN AERS
2.0000 | INHALATION_SPRAY | RESPIRATORY_TRACT | Status: DC | PRN
  Administered 2019-02-23: 2 via RESPIRATORY_TRACT
  Filled 2019-02-23: qty 6.7

## 2019-02-23 MED ORDER — FUROSEMIDE 10 MG/ML IJ SOLN
40.0000 mg | Freq: Once | INTRAMUSCULAR | Status: AC
  Administered 2019-02-23: 40 mg via INTRAVENOUS
  Filled 2019-02-23: qty 4

## 2019-02-23 NOTE — ED Provider Notes (Signed)
[facility] EMERGENCY DEPARTMENT Provider Note   CSN: [id number] Arrival date & time: 02/23/19  1348  History   Chief Complaint Chief Complaint  Patient presents with  . Shortness of Breath    HPI Derek Reed is a 33 y.o. male with PMHx s/f Anxiety (not currently on any medication), who presents to the ED with shortness of breath that has been continued for the last 2 weeks.  Patient reports that he is having shortness of breath that occurs with exertion.  Patient has never been previously diagnosed with asthma.  Patient reports some left-sided rib pain that started today which is why he came to the emergency department as he was concerned about the symptoms.  Patient was previously seen in the emergency department for cough 2 weeks ago.  He was sent home with Jerilynn Som.  Patient continues to have dry cough today but is more bothered by his shortness of breath.  Patient denies any tobacco use.   Allergies: Patient has no known allergies. Medications:  Current Facility-Administered Medications:  .  albuterol (VENTOLIN HFA) 108 (90 Base) MCG/ACT inhaler 2 puff, 2 puff, Inhalation, Q4H PRN, Melene Plan, MD, 2 puff at 02/23/19 1650  Current Outpatient Medications:  .  benzonatate (TESSALON) 100 MG capsule, Take 1 capsule (100 mg total) by mouth every 8 (eight) hours as needed for cough., Disp: 21 capsule, Rfl: 0   Past Medical/Surgical History Past Medical History:  Diagnosis Date  . Anxiety   . Peritonsillar abscess     Patient Active Problem List   Diagnosis Date Noted  . GAD (generalized anxiety disorder) 03/26/2014  . BMI 36.0-36.9,adult 03/26/2014    Past Surgical History:  Procedure Laterality Date  . INCISION AND DRAINAGE OF PERITONSILLAR ABCESS        Family History  Problem Relation Age of Onset  . Hyperlipidemia Mother   . Diabetes Mother   . Stroke Mother   . Hypertension Mother   . Hypertension Father    Social History   Tobacco Use  .  Smoking status: Former Games developer  . Smokeless tobacco: Never Used  . Tobacco comment: quit in January  Substance Use Topics  . Alcohol use: No  . Drug use: No   Review of Systems Review of Systems  Constitutional: Negative for chills and fever.  HENT: Negative for ear pain and sore throat.   Eyes: Negative for pain and visual disturbance.  Respiratory: Positive for shortness of breath. Negative for cough.        Pain to left lateral ribs  Cardiovascular: Negative for chest pain and palpitations.  Gastrointestinal: Negative for abdominal pain and vomiting.  Genitourinary: Negative for dysuria and hematuria.  Musculoskeletal: Negative for arthralgias and back pain.  Skin: Negative for color change and rash.  Neurological: Negative for seizures and syncope.  All other systems reviewed and are negative.   Physical Exam Updated Vital Signs BP (!) 162/112   Pulse (!) 46   Temp 99.4 F (37.4 C) (Oral)   Resp 20   Ht 5\' 6"  (1.676 m)   Wt 106.6 kg   SpO2 98%   BMI 37.93 kg/m   Physical Exam Vitals signs and nursing note reviewed.  Constitutional:      Appearance: He is well-developed. He is obese. He is diaphoretic.  HENT:     Head: Normocephalic and atraumatic.  Eyes:     Conjunctiva/sclera: Conjunctivae normal.  Neck:     Musculoskeletal: Neck supple.  Cardiovascular:  Rate and Rhythm: Regular rhythm. Tachycardia present.     Heart sounds: No murmur.  Pulmonary:     Effort: Pulmonary effort is normal. No respiratory distress.     Breath sounds: Examination of the right-middle field reveals rales. Examination of the right-lower field reveals rales. Examination of the left-lower field reveals rales. Rales present.  Chest:     Chest wall: No mass or tenderness.  Abdominal:     Palpations: Abdomen is soft.     Tenderness: There is no abdominal tenderness.  Musculoskeletal:     Right lower leg: No edema.     Left lower leg: No edema.  Skin:    General: Skin is warm.      Capillary Refill: Capillary refill takes less than 2 seconds.     Nails: There is no clubbing.   Neurological:     General: No focal deficit present.     Mental Status: He is alert.     ED Treatments / Results  Labs (all labs ordered are listed, but only abnormal results are displayed) Labs Reviewed  BASIC METABOLIC PANEL - Abnormal; Notable for the following components:      Result Value   Potassium 3.1 (*)    Creatinine, Ser 1.39 (*)    Calcium 8.7 (*)    All other components within normal limits  CBC WITH DIFFERENTIAL/PLATELET - Abnormal; Notable for the following components:   Hemoglobin 12.1 (*)    HCT 38.9 (*)    MCV 72.6 (*)    MCH 22.6 (*)    RDW 15.8 (*)    All other components within normal limits    EKG None  Radiology Dg Chest Port 1 View  Result Date: 02/23/2019 CLINICAL DATA:  Shortness of breath. EXAM: PORTABLE CHEST 1 VIEW COMPARISON:  02/15/2019 FINDINGS: Enlarged cardiac silhouette.  Mediastinal contours appear intact. Interstitial pulmonary edema. Osseous structures are without acute abnormality. Soft tissues are grossly normal. IMPRESSION: Enlarged cardiac silhouette, which may be due to cardiomyopathy or pericardial effusion. Interstitial pulmonary edema. Electronically Signed   By: Fidela Salisbury M.D.   On: 02/23/2019 16:37    Procedures Procedures (including critical care time)  Medications Ordered in ED Medications  albuterol (VENTOLIN HFA) 108 (90 Base) MCG/ACT inhaler 2 puff (2 puffs Inhalation Given 02/23/19 1650)    Initial Impression / Assessment and Plan / ED Course  I have reviewed the triage vital signs and the nursing notes. Pertinent labs & imaging results that were available during my care of the patient were reviewed by me and considered in my medical decision making (see chart for details). Patient's presentation concerning for asthma or cardiac pathology.  Patient's vitals are significant for tachycardia and hypertension.  Differential for shortness of breath includes asthma, OHS, deconditioning, exacerbated by obesity.   Patient did have some very mild inspiratory wheezing with expiratory crackles.  Will provide patient with DuoNeb's to see if he has any improvement in his breathing.  Given patient's strong family history of hypertension, diabetes, heart disease, will obtain EKG, chest x-ray CBC and BMP.  We will also obtain urine  5:14 PM Patient's chest x-ray returned with cardiomegaly, peripheral edema but no consolidations.  Enlarged cardiac silhouette (significantly enlarged and from chest x-ray in January 2016) likely more due to cardiomegaly rather than pericardial effusion given significant interstitial edema.  We will continue further with troponin and BNP.      Initial chemistries returned with creatinine 1.39 compared to creatinine of 1.59 on 02/15/2019.  Patient has lab values tracking back to 2014, where his creatinines are consistently over 1.1 (which is likely normal/baseline as he is African-American), however continued elevated values are concerning for AKI/CKD in the setting of obesity, hypertension. He is also hypokalemic to 3.1.  Will provide 60 mg K-Dur.  5:40 PM Check in with patient and he reports that his shortness of breath has improved with albuterol.  On exam of his lungs, patient has more obvious inspiratory and expiratory crackles at the bases of his lungs.  He does not have any bilateral lower extremity edema.    6:19 PM Troponin returned at 156 which requires hospitalization for this patient.  He continues to deny having any active chest pain but did have chest pain on his right lateral ribs earlier today.  I do not think this is currently an ischemic event and I do not think that he will require cath.  Will call hospitalist for admission for further cardiac work-up including echocardiogram and further blood pressure control.  Additionally BNP returned at 882.  This could be mildly elevated  due to poor renal function at this time but given the remainder of patient's history and presentation, this is likely due to acute heart failure.  We will start IV Lasix 40 for relief of dyspnea on exertion, K-Dur 40 mg every 4 hours x3.  Will not start ACE or ARB given elevated creatinine at this time but will start with amlodipine 5 mg.   Final Clinical Impressions(s) / ED Diagnoses   Final diagnoses:  None   ED Discharge Orders    None     Disposition: Admitted to Floor - the case was discussed with the admitting physician Dr. Cherylann Ratel, M.D. FM PGY-2      Melene Plan, MD 02/23/19 7829    Vanetta Mulders, MD 02/26/19 [phone]

## 2019-02-23 NOTE — ED Notes (Signed)
ED Provider at bedside. 

## 2019-02-23 NOTE — ED Triage Notes (Signed)
Pt states that he has been having SOB X2 weeks. Was seen here on the 7th and COVID was negative. Pt states that since he has had worsening SOB and dry cough.

## 2019-02-23 NOTE — ED Notes (Signed)
Critical results received; troponin 153. Dr Rogene Houston aware.

## 2019-02-23 NOTE — ED Notes (Signed)
ED TO INPATIENT HANDOFF REPORT  ED Nurse Name and Phone #: Anderson Malta [phone]  S Name/Age/Gender Derek Reed 33 y.o. male Room/Bed: MH11/MH11  Code Status   Code Status: Not on file  Home/SNF/Other Home Patient oriented to: self, place, time and situation Is this baseline? Yes   Triage Complete: Triage complete  Chief Complaint shortness of breath  Triage Note Pt states that he has been having SOB X2 weeks. Was seen here on the 7th and COVID was negative. Pt states that since he has had worsening SOB and dry cough.    Allergies No Known Allergies  Level of Care/Admitting Diagnosis ED Disposition    ED Disposition Condition [facility] Area: [place] [100100]  Level of Care: Telemetry Cardiac [103]  I expect the patient will be discharged within 24 hours: No (not a candidate for 5C-Observation unit)  Covid Evaluation: Asymptomatic Screening Protocol (No Symptoms)  Diagnosis: Dyspnea [329518]  Admitting Physician: Lenore Cordia [[id number]]  Attending Physician: Lenore Cordia [[id number]]  PT Class (Do Not Modify): Observation [104]  PT Acc Code (Do Not Modify): Observation [10022]       B Medical/Surgery History Past Medical History:  Diagnosis Date  . Anxiety   . Peritonsillar abscess    Past Surgical History:  Procedure Laterality Date  . INCISION AND DRAINAGE OF PERITONSILLAR ABCESS       A IV Location/Drains/Wounds Patient Lines/Drains/Airways Status   Active Line/Drains/Airways    Name:   Placement date:   Placement time:   Site:   Days:   Peripheral IV 02/23/19 Left Antecubital   02/23/19    1543    Antecubital   less than 1          Intake/Output Last 24 hours  Intake/Output Summary (Last 24 hours) at 02/23/2019 2130 Last data filed at 02/23/2019 2104 Gross per 24 hour  Intake -  Output 2600 ml  Net -2600 ml    Labs/Imaging Results for orders placed or performed during the hospital encounter of  02/23/19 (from the past 48 hour(s))  Basic metabolic panel     Status: Abnormal   Collection Time: 02/23/19  4:29 PM  Result Value Ref Range   Sodium 140 135 - 145 mmol/L   Potassium 3.1 (L) 3.5 - 5.1 mmol/L   Chloride 106 98 - 111 mmol/L   CO2 23 22 - 32 mmol/L   Glucose, Bld 95 70 - 99 mg/dL   BUN 16 6 - 20 mg/dL   Creatinine, Ser 1.39 (H) 0.61 - 1.24 mg/dL   Calcium 8.7 (L) 8.9 - 10.3 mg/dL   GFR calc non Af Amer >60 >60 mL/min   GFR calc Af Amer >60 >60 mL/min   Anion gap 11 5 - 15    Comment: Performed at [facility], [address] [place], [place] [address]  CBC with Differential     Status: Abnormal   Collection Time: 02/23/19  4:29 PM  Result Value Ref Range   WBC 7.5 4.0 - 10.5 K/uL    Comment: REPEATED TO VERIFY WHITE COUNT CONFIRMED ON SMEAR    RBC 5.36 4.22 - 5.81 MIL/uL   Hemoglobin 12.1 (L) 13.0 - 17.0 g/dL   HCT 38.9 (L) 39.0 - 52.0 %   MCV 72.6 (L) 80.0 - 100.0 fL   MCH 22.6 (L) 26.0 - 34.0 pg   MCHC 31.1 30.0 - 36.0 g/dL   RDW 15.8 (H) 11.5 - 15.5 %  Platelets 280 150 - 400 K/uL   nRBC 0.0 0.0 - 0.2 %   Neutrophils Relative % 50 %   Neutro Abs 3.7 1.7 - 7.7 K/uL   Lymphocytes Relative 37 %   Lymphs Abs 2.8 0.7 - 4.0 K/uL   Monocytes Relative 9 %   Monocytes Absolute 0.7 0.1 - 1.0 K/uL   Eosinophils Relative 4 %   Eosinophils Absolute 0.3 0.0 - 0.5 K/uL   Basophils Relative 0 %   Basophils Absolute 0.0 0.0 - 0.1 K/uL   WBC Morphology MORPHOLOGY UNREMARKABLE    RBC Morphology MORPHOLOGY UNREMARKABLE    Smear Review Normal platelet morphology    Immature Granulocytes 0 %   Abs Immature Granulocytes 0.02 0.00 - 0.07 K/uL    Comment: Performed at [facility], [address] [place], [place] [address]  Brain natriuretic peptide     Status: Abnormal   Collection Time: 02/23/19  4:29 PM  Result Value Ref Range   B Natriuretic Peptide 882.4 (H) 0.0 - 100.0 pg/mL    Comment: Performed at [facility], [address] [place], [place] [address]  Troponin I (High Sensitivity)     Status: Abnormal   Collection Time: 02/23/19  4:29 PM  Result Value Ref Range   Troponin I (High Sensitivity) 156 (HH) <18 ng/L    Comment: CRITICAL RESULT CALLED TO, READ BACK BY AND VERIFIED WITH: SAM COBLE RN @1808  02/23/2019 OLSONM (NOTE) Elevated high sensitivity troponin I (hsTnI) values and significant  changes across serial measurements may suggest ACS but many other  chronic and acute conditions are known to elevate hsTnI results.  Refer to the Links section for chest pain algorithms and additional  guidance. Performed at [facility], [address] [place], [place] [address]   Troponin I (High Sensitivity)     Status: Abnormal   Collection Time: 02/23/19  7:36 PM  Result Value Ref Range   Troponin I (High Sensitivity) 153 (HH) <18 ng/L    Comment: CRITICAL RESULT CALLED TO, READ BACK BY AND VERIFIED WITH: MEREDITH WALTON RN @2026  02/23/2019 OLSONM (NOTE) Elevated high sensitivity troponin I (hsTnI) values and significant  changes across serial measurements may suggest ACS but many other  chronic and acute conditions are known to elevate hsTnI results.  Refer to the Links section for chest pain algorithms and additional  guidance. Performed at [facility], [address] [place], [place] [address]    Dg Chest [place] 1 View  Result Date: 02/23/2019 CLINICAL DATA:  Shortness of breath. EXAM: PORTABLE CHEST 1 VIEW COMPARISON:  02/15/2019 FINDINGS: Enlarged cardiac silhouette.  Mediastinal contours appear intact. Interstitial pulmonary edema. Osseous structures are without acute abnormality. Soft tissues are grossly normal. IMPRESSION: Enlarged cardiac silhouette, which may be due to cardiomyopathy or pericardial effusion. Interstitial pulmonary edema. Electronically Signed   By: Ted Mcalpine M.D.   On: 02/23/2019 16:37    Pending Labs Unresulted Labs (From admission, onward)     Start     Ordered   02/23/19 1817  SARS CORONAVIRUS 2 (TAT 6-24 HRS) Nasopharyngeal Nasopharyngeal Swab  (Asymptomatic/Tier 2 Patients Labs)  Once,   STAT    Question Answer Comment  Is this test for diagnosis or screening Screening   Symptomatic for COVID-19 as defined by CDC No   Hospitalized for COVID-19 No   Admitted to ICU for COVID-19 No   Previously tested for COVID-19 Yes   Resident  in a congregate (group) care setting No   Employed in healthcare setting No      02/23/19 1817          Vitals/Pain Today's Vitals   02/23/19 1800 02/23/19 1849 02/23/19 1900 02/23/19 2100  BP:  (!) 174/115 (!) 207/140 (!) 143/111  Pulse:  (!) 102 (!) 51 98  Resp:  18 (!) 21 20  Temp:      TempSrc:      SpO2:  99% 98% 96%  Weight:      Height:      PainSc: 3        Isolation Precautions No active isolations  Medications Medications  albuterol (VENTOLIN HFA) 108 (90 Base) MCG/ACT inhaler 2 puff (2 puffs Inhalation Given 02/23/19 1650)  potassium chloride SA (KLOR-CON) CR tablet 40 mEq (40 mEq Oral Given 02/23/19 1850)  furosemide (LASIX) injection 40 mg (40 mg Intravenous Given 02/23/19 1853)  amLODipine (NORVASC) tablet 5 mg (5 mg Oral Given 02/23/19 1851)    Mobility walks Low fall risk   Focused Assessments Cardiac Assessment Handoff:  Cardiac Rhythm: Sinus tachycardia Lab Results  Component Value Date   TROPONINI <0.03 05/28/2014   No results found for: DDIMER Does the Patient currently have chest pain? No     R Recommendations: See Admitting Provider Note  Report given to:   Additional Notes: A&Ox4, has cell phone

## 2019-02-23 NOTE — ED Provider Notes (Signed)
I saw and evaluated the patient, reviewed the resident's note and I agree with the findings and plan.  EKG: EKG Interpretation  Date/Time:  Thursday February 23 2019 16:26:43 EDT Ventricular Rate:  94 PR Interval:    QRS Duration: 90 QT Interval:  384 QTC Calculation: 481 R Axis:   -26 Text Interpretation:  Sinus rhythm Ventricular premature complex Probable left atrial enlargement Left ventricular hypertrophy Nonspecific T abnormalities, lateral leads Borderline prolonged QT interval Confirmed by Fredia Sorrow [phone]) on 02/23/2019 6:13:27 PM    Results for orders placed or performed during the hospital encounter of 16/96/78  Basic metabolic panel  Result Value Ref Range   Sodium 140 135 - 145 mmol/L   Potassium 3.1 (L) 3.5 - 5.1 mmol/L   Chloride 106 98 - 111 mmol/L   CO2 23 22 - 32 mmol/L   Glucose, Bld 95 70 - 99 mg/dL   BUN 16 6 - 20 mg/dL   Creatinine, Ser 1.39 (H) 0.61 - 1.24 mg/dL   Calcium 8.7 (L) 8.9 - 10.3 mg/dL   GFR calc non Af Amer >60 >60 mL/min   GFR calc Af Amer >60 >60 mL/min   Anion gap 11 5 - 15  CBC with Differential  Result Value Ref Range   WBC 7.5 4.0 - 10.5 K/uL   RBC 5.36 4.22 - 5.81 MIL/uL   Hemoglobin 12.1 (L) 13.0 - 17.0 g/dL   HCT 38.9 (L) 39.0 - 52.0 %   MCV 72.6 (L) 80.0 - 100.0 fL   MCH 22.6 (L) 26.0 - 34.0 pg   MCHC 31.1 30.0 - 36.0 g/dL   RDW 15.8 (H) 11.5 - 15.5 %   Platelets 280 150 - 400 K/uL   nRBC 0.0 0.0 - 0.2 %   Neutrophils Relative % 50 %   Neutro Abs 3.7 1.7 - 7.7 K/uL   Lymphocytes Relative 37 %   Lymphs Abs 2.8 0.7 - 4.0 K/uL   Monocytes Relative 9 %   Monocytes Absolute 0.7 0.1 - 1.0 K/uL   Eosinophils Relative 4 %   Eosinophils Absolute 0.3 0.0 - 0.5 K/uL   Basophils Relative 0 %   Basophils Absolute 0.0 0.0 - 0.1 K/uL   WBC Morphology MORPHOLOGY UNREMARKABLE    RBC Morphology MORPHOLOGY UNREMARKABLE    Smear Review Normal platelet morphology    Immature Granulocytes 0 %   Abs Immature Granulocytes 0.02 0.00 -  0.07 K/uL  Brain natriuretic peptide  Result Value Ref Range   B Natriuretic Peptide 882.4 (H) 0.0 - 100.0 pg/mL  Troponin I (High Sensitivity)  Result Value Ref Range   Troponin I (High Sensitivity) 156 (HH) <18 ng/L   Patient seen by me along with the resident.  Patient presenting with a complaint of shortness of breath for 2 weeks.  Patient was seen here on the seventh was Covid negative at that time.  Since that time he has had some worsening shortness of breath and a dry cough.  Patient with some mild chest discomfort symptoms but nothing persistent or very strong.  Work-up here chest x-ray consistent with some mild pulmonary edema.  BNP markedly elevated at 882.  Troponin elevated at 156.  EKG without STEMI changes.  Feel that this is all secondary to poorly controlled longstanding hypertension.  Resident discussed it with the hospitalist patient will be [facility] to [facility].  She will start blood pressure control and give him 40 mg of Lasix here.  Patient's oxygen saturations on room air in the  upper 90s.     Vanetta Mulders, MD 02/23/19 [phone]

## 2019-02-24 ENCOUNTER — Encounter (HOSPITAL_COMMUNITY)
Admission: EM | Disposition: A | Discharge status: Home / Self Care | Payer: Self-pay | Source: Home / Self Care | Attending: Internal Medicine

## 2019-02-24 ENCOUNTER — Observation Stay (HOSPITAL_COMMUNITY): Payer: Self-pay

## 2019-02-24 DIAGNOSIS — I509 Heart failure, unspecified: Secondary | ICD-10-CM

## 2019-02-24 DIAGNOSIS — I5022 Chronic systolic (congestive) heart failure: Secondary | ICD-10-CM

## 2019-02-24 DIAGNOSIS — I429 Cardiomyopathy, unspecified: Secondary | ICD-10-CM

## 2019-02-24 DIAGNOSIS — N182 Chronic kidney disease, stage 2 (mild): Secondary | ICD-10-CM | POA: Diagnosis present

## 2019-02-24 DIAGNOSIS — E876 Hypokalemia: Secondary | ICD-10-CM | POA: Diagnosis present

## 2019-02-24 DIAGNOSIS — I5021 Acute systolic (congestive) heart failure: Secondary | ICD-10-CM

## 2019-02-24 DIAGNOSIS — I10 Essential (primary) hypertension: Secondary | ICD-10-CM | POA: Diagnosis present

## 2019-02-24 DIAGNOSIS — N179 Acute kidney failure, unspecified: Secondary | ICD-10-CM

## 2019-02-24 DIAGNOSIS — I5031 Acute diastolic (congestive) heart failure: Secondary | ICD-10-CM

## 2019-02-24 DIAGNOSIS — R778 Other specified abnormalities of plasma proteins: Secondary | ICD-10-CM | POA: Diagnosis present

## 2019-02-24 DIAGNOSIS — I160 Hypertensive urgency: Secondary | ICD-10-CM

## 2019-02-24 DIAGNOSIS — F419 Anxiety disorder, unspecified: Secondary | ICD-10-CM | POA: Diagnosis present

## 2019-02-24 HISTORY — PX: RIGHT/LEFT HEART CATH AND CORONARY ANGIOGRAPHY: CATH118266

## 2019-02-24 HISTORY — DX: Heart failure, unspecified: I50.9

## 2019-02-24 HISTORY — DX: Essential (primary) hypertension: I10

## 2019-02-24 LAB — URINALYSIS, ROUTINE W REFLEX MICROSCOPIC
Bacteria, UA: NONE SEEN
Bilirubin Urine: NEGATIVE
Glucose, UA: 50 mg/dL — AB
Ketones, ur: NEGATIVE mg/dL
Leukocytes,Ua: NEGATIVE
Nitrite: NEGATIVE
Protein, ur: 100 mg/dL — AB
Specific Gravity, Urine: 1.014 (ref 1.005–1.030)
pH: 6 (ref 5.0–8.0)

## 2019-02-24 LAB — CBC
HCT: 44.3 % (ref 39.0–52.0)
Hemoglobin: 14.2 g/dL (ref 13.0–17.0)
MCH: 23.1 pg — ABNORMAL LOW (ref 26.0–34.0)
MCHC: 32.1 g/dL (ref 30.0–36.0)
MCV: 72 fL — ABNORMAL LOW (ref 80.0–100.0)
Platelets: 318 K/uL (ref 150–400)
RBC: 6.15 MIL/uL — ABNORMAL HIGH (ref 4.22–5.81)
RDW: 17 % — ABNORMAL HIGH (ref 11.5–15.5)
WBC: 7 K/uL (ref 4.0–10.5)
nRBC: 0 % (ref 0.0–0.2)

## 2019-02-24 LAB — POCT I-STAT 7, (LYTES, BLD GAS, ICA,H+H)
Acid-Base Excess: 1 mmol/L (ref 0.0–2.0)
Bicarbonate: 25.6 mmol/L (ref 20.0–28.0)
Calcium, Ion: 1.18 mmol/L (ref 1.15–1.40)
HCT: 45 % (ref 39.0–52.0)
Hemoglobin: 15.3 g/dL (ref 13.0–17.0)
O2 Saturation: 70 %
Potassium: 3.3 mmol/L — ABNORMAL LOW (ref 3.5–5.1)
Sodium: 143 mmol/L (ref 135–145)
TCO2: 27 mmol/L (ref 22–32)
pCO2 arterial: 39.1 mmHg (ref 32.0–48.0)
pH, Arterial: 7.423 (ref 7.350–7.450)
pO2, Arterial: 36 mmHg — CL (ref 83.0–108.0)

## 2019-02-24 LAB — HIV ANTIBODY (ROUTINE TESTING W REFLEX): HIV Screen 4th Generation wRfx: NONREACTIVE

## 2019-02-24 LAB — LIPID PANEL
Cholesterol: 183 mg/dL (ref 0–200)
HDL: 31 mg/dL — ABNORMAL LOW (ref >40)
LDL Cholesterol: 139 mg/dL — ABNORMAL HIGH (ref 0–99)
Total CHOL/HDL Ratio: 5.9 RATIO
Triglycerides: 65 mg/dL (ref <150)
VLDL: 13 mg/dL (ref 0–40)

## 2019-02-24 LAB — ECHOCARDIOGRAM COMPLETE
Height: 66 in
Weight: 3707.2 oz

## 2019-02-24 LAB — HEMOGLOBIN A1C
Hgb A1c MFr Bld: 6.2 % — ABNORMAL HIGH (ref 4.8–5.6)
Mean Plasma Glucose: 131.24 mg/dL

## 2019-02-24 LAB — TROPONIN I (HIGH SENSITIVITY)
Troponin I (High Sensitivity): 180 ng/L (ref <18)
Troponin I (High Sensitivity): 198 ng/L (ref <18)

## 2019-02-24 LAB — CREATININE, SERUM
Creatinine, Ser: 1.43 mg/dL — ABNORMAL HIGH (ref 0.61–1.24)
GFR calc Af Amer: >60 mL/min (ref >60)
GFR calc non Af Amer: >60 mL/min (ref >60)

## 2019-02-24 LAB — RAPID URINE DRUG SCREEN, HOSP PERFORMED
Amphetamines: NOT DETECTED
Barbiturates: NOT DETECTED
Benzodiazepines: NOT DETECTED
Cocaine: NOT DETECTED
Opiates: NOT DETECTED
Tetrahydrocannabinol: NOT DETECTED

## 2019-02-24 LAB — MAGNESIUM: Magnesium: 2.1 mg/dL (ref 1.7–2.4)

## 2019-02-24 LAB — SARS CORONAVIRUS 2 (TAT 6-24 HRS): SARS Coronavirus 2: NEGATIVE

## 2019-02-24 SURGERY — RIGHT/LEFT HEART CATH AND CORONARY ANGIOGRAPHY
Anesthesia: LOCAL

## 2019-02-24 MED ORDER — LIDOCAINE HCL (PF) 1 % IJ SOLN
INTRAMUSCULAR | Status: AC
  Filled 2019-02-24: qty 30

## 2019-02-24 MED ORDER — HYDRALAZINE HCL 20 MG/ML IJ SOLN: 10.0000 mg | INTRAMUSCULAR | Status: AC | PRN

## 2019-02-24 MED ORDER — HEPARIN (PORCINE) IN NACL 1000-0.9 UT/500ML-% IV SOLN
INTRAVENOUS | Status: DC | PRN
  Administered 2019-02-24 (×2): 500 mL

## 2019-02-24 MED ORDER — HEPARIN SODIUM (PORCINE) 1000 UNIT/ML IJ SOLN
INTRAMUSCULAR | Status: AC
  Filled 2019-02-24: qty 1

## 2019-02-24 MED ORDER — AMLODIPINE BESYLATE 5 MG PO TABS
5.0000 mg | ORAL_TABLET | Freq: Once | ORAL | Status: AC
  Administered 2019-02-24: 13:00:00 5 mg via ORAL
  Filled 2019-02-24: qty 1

## 2019-02-24 MED ORDER — DM-GUAIFENESIN ER 30-600 MG PO TB12: 1.0000 | ORAL_TABLET | Freq: Two times a day (BID) | ORAL | Status: DC | PRN

## 2019-02-24 MED ORDER — VERAPAMIL HCL 2.5 MG/ML IV SOLN
INTRAVENOUS | Status: DC | PRN
  Administered 2019-02-24: 10 mL via INTRA_ARTERIAL

## 2019-02-24 MED ORDER — ENOXAPARIN SODIUM 40 MG/0.4ML ~~LOC~~ SOLN: 40.0000 mg | SUBCUTANEOUS | Status: DC

## 2019-02-24 MED ORDER — ENOXAPARIN SODIUM 40 MG/0.4ML ~~LOC~~ SOLN
40.0000 mg | SUBCUTANEOUS | Status: DC
  Administered 2019-02-24 – 2019-02-25 (×2): 40 mg via SUBCUTANEOUS
  Filled 2019-02-24 (×2): qty 0.4

## 2019-02-24 MED ORDER — SODIUM CHLORIDE 0.9% FLUSH: 3.0000 mL | INTRAVENOUS | Status: DC | PRN

## 2019-02-24 MED ORDER — POTASSIUM CHLORIDE CRYS ER 20 MEQ PO TBCR
40.0000 meq | EXTENDED_RELEASE_TABLET | Freq: Two times a day (BID) | ORAL | Status: DC
  Administered 2019-02-24 – 2019-02-26 (×5): 40 meq via ORAL
  Filled 2019-02-24 (×4): qty 2
  Filled 2019-02-24: qty 4

## 2019-02-24 MED ORDER — FENTANYL CITRATE (PF) 100 MCG/2ML IJ SOLN
INTRAMUSCULAR | Status: AC
  Filled 2019-02-24: qty 2

## 2019-02-24 MED ORDER — MORPHINE SULFATE (PF) 2 MG/ML IV SOLN: 2.0000 mg | INTRAVENOUS | Status: DC | PRN

## 2019-02-24 MED ORDER — LIDOCAINE HCL (PF) 1 % IJ SOLN
INTRAMUSCULAR | Status: DC | PRN
  Administered 2019-02-24 (×2): 2 mL

## 2019-02-24 MED ORDER — NITROGLYCERIN 0.4 MG SL SUBL: 0.4000 mg | SUBLINGUAL_TABLET | SUBLINGUAL | Status: DC | PRN

## 2019-02-24 MED ORDER — SODIUM CHLORIDE 0.9% FLUSH
3.0000 mL | Freq: Two times a day (BID) | INTRAVENOUS | Status: DC
  Administered 2019-02-24 – 2019-02-25 (×3): 3 mL via INTRAVENOUS

## 2019-02-24 MED ORDER — SACUBITRIL-VALSARTAN 49-51 MG PO TABS: 1.0000 | ORAL_TABLET | Freq: Two times a day (BID) | ORAL | Status: DC

## 2019-02-24 MED ORDER — ASPIRIN EC 81 MG PO TBEC: 81.0000 mg | DELAYED_RELEASE_TABLET | Freq: Every day | ORAL | Status: DC

## 2019-02-24 MED ORDER — AMLODIPINE BESYLATE 5 MG PO TABS
5.0000 mg | ORAL_TABLET | Freq: Once | ORAL | Status: AC
  Administered 2019-02-24: 5 mg via ORAL
  Filled 2019-02-24: qty 1

## 2019-02-24 MED ORDER — SPIRONOLACTONE 12.5 MG HALF TABLET
12.5000 mg | ORAL_TABLET | Freq: Every day | ORAL | Status: DC
  Administered 2019-02-24 – 2019-02-25 (×2): 12.5 mg via ORAL
  Filled 2019-02-24 (×2): qty 1

## 2019-02-24 MED ORDER — ACETAMINOPHEN 325 MG PO TABS: 650.0000 mg | ORAL_TABLET | ORAL | Status: DC | PRN

## 2019-02-24 MED ORDER — FENTANYL CITRATE (PF) 100 MCG/2ML IJ SOLN
INTRAMUSCULAR | Status: DC | PRN
  Administered 2019-02-24 (×2): 25 ug via INTRAVENOUS

## 2019-02-24 MED ORDER — SACUBITRIL-VALSARTAN 24-26 MG PO TABS
1.0000 | ORAL_TABLET | Freq: Two times a day (BID) | ORAL | Status: DC
  Administered 2019-02-24 – 2019-02-25 (×4): 1 via ORAL
  Filled 2019-02-24 (×4): qty 1

## 2019-02-24 MED ORDER — PERFLUTREN LIPID MICROSPHERE
1.0000 mL | INTRAVENOUS | Status: AC | PRN
  Administered 2019-02-24: 2 mL via INTRAVENOUS
  Filled 2019-02-24: qty 10

## 2019-02-24 MED ORDER — FUROSEMIDE 40 MG PO TABS
40.0000 mg | ORAL_TABLET | Freq: Every day | ORAL | Status: DC
  Administered 2019-02-25 – 2019-02-26 (×2): 40 mg via ORAL
  Filled 2019-02-24 (×2): qty 1

## 2019-02-24 MED ORDER — ASPIRIN EC 81 MG PO TBEC
81.0000 mg | DELAYED_RELEASE_TABLET | Freq: Every day | ORAL | Status: DC
  Administered 2019-02-24 – 2019-02-26 (×3): 81 mg via ORAL
  Filled 2019-02-24 (×4): qty 1

## 2019-02-24 MED ORDER — SODIUM CHLORIDE 0.9 % IV SOLN: 250.0000 mL | INTRAVENOUS | Status: DC | PRN

## 2019-02-24 MED ORDER — MIDAZOLAM HCL 2 MG/2ML IJ SOLN
INTRAMUSCULAR | Status: AC
  Filled 2019-02-24: qty 2

## 2019-02-24 MED ORDER — HEPARIN (PORCINE) IN NACL 1000-0.9 UT/500ML-% IV SOLN
INTRAVENOUS | Status: AC
  Filled 2019-02-24: qty 1000

## 2019-02-24 MED ORDER — HEPARIN (PORCINE) IN NACL 1000-0.9 UT/500ML-% IV SOLN: INTRAVENOUS | Status: DC | PRN

## 2019-02-24 MED ORDER — HYDROXYZINE HCL 10 MG PO TABS: 10.0000 mg | ORAL_TABLET | Freq: Three times a day (TID) | ORAL | Status: DC | PRN

## 2019-02-24 MED ORDER — HYDRALAZINE HCL 25 MG PO TABS: 25.0000 mg | ORAL_TABLET | Freq: Three times a day (TID) | ORAL | Status: DC | PRN

## 2019-02-24 MED ORDER — LABETALOL HCL 5 MG/ML IV SOLN: 10.0000 mg | INTRAVENOUS | Status: AC | PRN

## 2019-02-24 MED ORDER — SODIUM CHLORIDE 0.9% FLUSH
3.0000 mL | Freq: Two times a day (BID) | INTRAVENOUS | Status: DC
  Administered 2019-02-24 – 2019-02-25 (×3): 3 mL via INTRAVENOUS

## 2019-02-24 MED ORDER — SODIUM CHLORIDE 0.9 % IV SOLN
INTRAVENOUS | Status: AC | PRN
  Administered 2019-02-24: 10 mL/h via INTRAVENOUS

## 2019-02-24 MED ORDER — VERAPAMIL HCL 2.5 MG/ML IV SOLN
INTRAVENOUS | Status: AC
  Filled 2019-02-24: qty 2

## 2019-02-24 MED ORDER — ATORVASTATIN CALCIUM 40 MG PO TABS
40.0000 mg | ORAL_TABLET | Freq: Every day | ORAL | Status: DC
  Administered 2019-02-24 – 2019-02-25 (×3): 40 mg via ORAL
  Filled 2019-02-24 (×3): qty 1

## 2019-02-24 MED ORDER — FUROSEMIDE 10 MG/ML IJ SOLN: 40.0000 mg | Freq: Two times a day (BID) | INTRAMUSCULAR | Status: DC

## 2019-02-24 MED ORDER — AMLODIPINE BESYLATE 10 MG PO TABS
10.0000 mg | ORAL_TABLET | Freq: Every day | ORAL | Status: DC
  Administered 2019-02-25: 08:00:00 10 mg via ORAL
  Filled 2019-02-24: qty 1

## 2019-02-24 MED ORDER — ONDANSETRON HCL 4 MG/2ML IJ SOLN: 4.0000 mg | Freq: Four times a day (QID) | INTRAMUSCULAR | Status: DC | PRN

## 2019-02-24 MED ORDER — MIDAZOLAM HCL 2 MG/2ML IJ SOLN
INTRAMUSCULAR | Status: DC | PRN
  Administered 2019-02-24 (×2): 1 mg via INTRAVENOUS

## 2019-02-24 MED ORDER — HEPARIN SODIUM (PORCINE) 1000 UNIT/ML IJ SOLN
INTRAMUSCULAR | Status: DC | PRN
  Administered 2019-02-24: 5000 [IU] via INTRAVENOUS

## 2019-02-24 MED ORDER — AMLODIPINE BESYLATE 10 MG PO TABS
10.0000 mg | ORAL_TABLET | Freq: Every day | ORAL | Status: DC
  Administered 2019-02-24: 10 mg via ORAL
  Filled 2019-02-24: qty 1

## 2019-02-24 MED ORDER — IOHEXOL 350 MG/ML SOLN
INTRAVENOUS | Status: DC | PRN
  Administered 2019-02-24: 90 mL

## 2019-02-24 MED ORDER — SODIUM CHLORIDE 0.9 % IV SOLN
INTRAVENOUS | Status: AC
  Administered 2019-02-24: 18:00:00 via INTRAVENOUS

## 2019-02-24 MED ORDER — FUROSEMIDE 10 MG/ML IJ SOLN
20.0000 mg | Freq: Two times a day (BID) | INTRAMUSCULAR | Status: DC
  Administered 2019-02-24: 20 mg via INTRAVENOUS
  Filled 2019-02-24: qty 2

## 2019-02-24 SURGICAL SUPPLY — 11 items
CATH 5FR JL3.5 JR4 ANG PIG MP (CATHETERS) ×2 IMPLANT
CATH BALLN WEDGE 5F 110CM (CATHETERS) ×1 IMPLANT
CATH INFINITI 5 FR 3DRC (CATHETERS) ×2 IMPLANT
DEVICE RAD COMP TR BAND LRG (VASCULAR PRODUCTS) ×1 IMPLANT
GLIDESHEATH SLEND SS 6F .021 (SHEATH) ×1 IMPLANT
GUIDEWIRE INQWIRE 1.5J.035X260 (WIRE) ×1 IMPLANT
INQWIRE 1.5J .035X260CM (WIRE) ×2
KIT HEART LEFT (KITS) ×1 IMPLANT
PACK CARDIAC CATHETERIZATION (CUSTOM PROCEDURE TRAY) ×2 IMPLANT
SHEATH GLIDE SLENDER 4/5FR (SHEATH) ×1 IMPLANT
TRANSDUCER W/STOPCOCK (MISCELLANEOUS) ×2 IMPLANT

## 2019-02-24 NOTE — Interval H&P Note (Signed)
History and Physical Interval Note:  02/24/2019 4:15 PM  Derek Reed  has presented today for surgery, with the diagnosis of heart failure.  The various methods of treatment have been discussed with the patient and family. After consideration of risks, benefits and other options for treatment, the patient has consented to  Procedure(s): RIGHT/LEFT HEART CATH AND CORONARY ANGIOGRAPHY (N/A) as a surgical intervention.  The patient's history has been reviewed, patient examined, no change in status, stable for surgery.  I have reviewed the patient's chart and labs.  Questions were answered to the patient's satisfaction.     Isac Lincks [employer]

## 2019-02-24 NOTE — Discharge Instructions (Signed)

## 2019-02-24 NOTE — Progress Notes (Addendum)
PROGRESS NOTE    Derek Reed  URK:[id number] DOB: Sep 24, 1985 DOA: 02/23/2019 PCP: Patient, No Pcp Per  Brief Narrative: Derek Reed is a 33 y.o. male with medical history significant of hypertension not taking medications, CKD-2, peritonsillar abscess, anxiety, who presented with shortness of breath, for more than 2 weeks.  He has dry cough. - COVID-19 test was negative, EKG noted LVH Chest x-ray showed increased cardiac silhouette and interstitial edema   Assessment & Plan:   Acute CHF/new onset -Suspect hypertensive cardiomyopathy, has LVH on EKG -Continue IV Lasix today, 40 mg every 12 with potassium, monitor I's/O, daily weights -Follow-up 2D echocardiogram -Cardiology consulted -Dietitian consult  Elevated troponin:  -Troponin 156 -->153. -Secondary to demand from CHF, follow-up echo  Borderline diabetes -Hemoglobin A1c is 6.2, patient educated regarding this, dietitian consult, educated on importance to lose weight and lifestyle modification  Hypokalemia:  -Repleted  HTN (hypertension): bp 207/140 on arrival -Improving with diuresis, add amlodipine -IV Lasix as above  CKD-II: Baseline creatinine 1.2, now 1.39 -Suspect hypertensive nephrosclerosis, check urinalysis, monitor with diuresis  Anxiety: -prn hydroxyzine  DVT ppx:  SQ Lovenox Code Status: Full code Family Communication: None at bed side.    Disposition Plan:  Anticipate discharge back to previous home environment  Consultants:   Cardiology   Procedures:   Antimicrobials:    Subjective: -Breathing better, not back to baseline yet  Objective: Vitals:   02/23/19 2305 02/24/19 0030 02/24/19 0528 02/24/19 0750  BP: (!) 162/124 (!) 177/130 (!) 173/123 (!) 156/119  Pulse: 93 92 94 (!) 51  Resp: (!) 29 16 17 18   Temp:  98.3 F (36.8 C) 97.9 F (36.6 C) 99.2 F (37.3 C)  TempSrc:  Oral Oral Oral  SpO2: 99% 100% 100% 100%  Weight:  106.2 kg 105.1 kg   Height:  5\' 6"  (1.676 m)       Intake/Output Summary (Last 24 hours) at 02/24/2019 1117 Last data filed at 02/24/2019 0914 Gross per 24 hour  Intake 920 ml  Output 5100 ml  Net -4180 ml   Filed Weights   02/23/19 1406 02/24/19 0030 02/24/19 0528  Weight: 106.6 kg 106.2 kg 105.1 kg    Examination:  General exam: Appears calm and comfortable  Respiratory system: Few basilar crackles Cardiovascular system: S1 & S2 heard, RRR.  Gastrointestinal system: Abdomen is nondistended, soft and nontender.Normal bowel sounds heard. Central nervous system: Alert and oriented. No focal neurological deficits. Extremities: No edema Skin: No rashes, lesions or ulcers Psychiatry: Judgement and insight appear normal. Mood & affect appropriate.     Data Reviewed:   CBC: Recent Labs  Lab 02/23/19 1629  WBC 7.5  NEUTROABS 3.7  HGB 12.1*  HCT 38.9*  MCV 72.6*  PLT 831   Basic Metabolic Panel: Recent Labs  Lab 02/23/19 1629 02/24/19 0338  NA 140  --   K 3.1*  --   CL 106  --   CO2 23  --   GLUCOSE 95  --   BUN 16  --   CREATININE 1.39*  --   CALCIUM 8.7*  --   MG  --  2.1   GFR: Estimated Creatinine Clearance: 84.2 mL/min (A) (by C-G formula based on SCr of 1.39 mg/dL (H)). Liver Function Tests: No results for input(s): AST, ALT, ALKPHOS, BILITOT, PROT, ALBUMIN in the last 168 hours. No results for input(s): LIPASE, AMYLASE in the last 168 hours. No results for input(s): AMMONIA in the last 168 hours. Coagulation Profile: No results  for input(s): INR, PROTIME in the last 168 hours. Cardiac Enzymes: No results for input(s): CKTOTAL, CKMB, CKMBINDEX, TROPONINI in the last 168 hours. BNP (last 3 results) No results for input(s): PROBNP in the last 8760 hours. HbA1C: Recent Labs    02/24/19 0338  HGBA1C 6.2*   CBG: No results for input(s): GLUCAP in the last 168 hours. Lipid Profile: Recent Labs    02/24/19 0338  CHOL 183  HDL 31*  LDLCALC 139*  TRIG 65  CHOLHDL 5.9   Thyroid Function Tests:  No results for input(s): TSH, T4TOTAL, FREET4, T3FREE, THYROIDAB in the last 72 hours. Anemia Panel: No results for input(s): VITAMINB12, FOLATE, FERRITIN, TIBC, IRON, RETICCTPCT in the last 72 hours. Urine analysis:    Component Value Date/Time   COLORURINE YELLOW 09/20/2015 2100   APPEARANCEUR CLEAR 09/20/2015 2100   LABSPEC 1.028 09/20/2015 2100   PHURINE 6.5 09/20/2015 2100   GLUCOSEU NEGATIVE 09/20/2015 2100   HGBUR NEGATIVE 09/20/2015 2100   BILIRUBINUR NEGATIVE 09/20/2015 2100   KETONESUR NEGATIVE 09/20/2015 2100   PROTEINUR 30 (A) 09/20/2015 2100   NITRITE NEGATIVE 09/20/2015 2100   LEUKOCYTESUR NEGATIVE 09/20/2015 2100   Sepsis Labs: @LABRCNTIP (procalcitonin:4,lacticidven:4)  ) Recent Results (from the past 240 hour(s))  SARS CORONAVIRUS 2 (TAT 6-24 HRS) Nasopharyngeal Nasopharyngeal Swab     Status: None   Collection Time: 02/15/19  3:39 PM   Specimen: Nasopharyngeal Swab  Result Value Ref Range Status   SARS Coronavirus 2 NEGATIVE NEGATIVE Final    Comment: (NOTE) SARS-CoV-2 target nucleic acids are NOT DETECTED. The SARS-CoV-2 RNA is generally detectable in upper and lower respiratory specimens during the acute phase of infection. Negative results do not preclude SARS-CoV-2 infection, do not rule out co-infections with other pathogens, and should not be used as the sole basis for treatment or other patient management decisions. Negative results must be combined with clinical observations, patient history, and epidemiological information. The expected result is Negative. Fact Sheet for Patients: 04/17/19 Fact Sheet for Healthcare Providers: HairSlick.no This test is not yet approved or cleared by the quierodirigir.com FDA and  has been authorized for detection and/or diagnosis of SARS-CoV-2 by FDA under an Emergency Use Authorization (EUA). This EUA will remain  in effect (meaning this test can be used)  for the duration of the COVID-19 declaration under Section 56 4(b)(1) of the Act, 21 U.S.C. section 360bbb-3(b)(1), unless the authorization is terminated or revoked sooner. Performed at [facility], [address], [place], [place]   SARS CORONAVIRUS 2 (TAT 6-24 HRS) Nasopharyngeal Nasopharyngeal Swab     Status: None   Collection Time: 02/23/19  6:57 PM   Specimen: Nasopharyngeal Swab  Result Value Ref Range Status   SARS Coronavirus 2 NEGATIVE NEGATIVE Final    Comment: (NOTE) SARS-CoV-2 target nucleic acids are NOT DETECTED. The SARS-CoV-2 RNA is generally detectable in upper and lower respiratory specimens during the acute phase of infection. Negative results do not preclude SARS-CoV-2 infection, do not rule out co-infections with other pathogens, and should not be used as the sole basis for treatment or other patient management decisions. Negative results must be combined with clinical observations, patient history, and epidemiological information. The expected result is Negative. Fact Sheet for Patients: 02/25/19 Fact Sheet for Healthcare Providers: HairSlick.no This test is not yet approved or cleared by the quierodirigir.com FDA and  has been authorized for detection and/or diagnosis of SARS-CoV-2 by FDA under an Emergency Use Authorization (EUA). This EUA will remain  in effect (meaning this test can be used) for the duration of the COVID-19 declaration under Section 56 4(b)(1) of the Act, 21 U.S.C. section 360bbb-3(b)(1), unless the authorization is terminated or revoked sooner. Performed at [facility], [address] [place], [place] [address]          Radiology Studies: Dg Chest Port 1 View  Result Date: 02/23/2019 CLINICAL DATA:  Shortness of breath. EXAM: PORTABLE CHEST 1 VIEW COMPARISON:  02/15/2019 FINDINGS: Enlarged cardiac silhouette.  Mediastinal contours appear  intact. Interstitial pulmonary edema. Osseous structures are without acute abnormality. Soft tissues are grossly normal. IMPRESSION: Enlarged cardiac silhouette, which may be due to cardiomyopathy or pericardial effusion. Interstitial pulmonary edema. Electronically Signed   By: Ted Mcalpine M.D.   On: 02/23/2019 16:37        Scheduled Meds: . aspirin EC  81 mg Oral Daily  . atorvastatin  40 mg Oral q1800  . enoxaparin (LOVENOX) injection  40 mg Subcutaneous Q24H  . furosemide  40 mg Intravenous Q12H  . potassium chloride  40 mEq Oral BID  . sacubitril-valsartan  1 tablet Oral BID  . sodium chloride flush  3 mL Intravenous Q12H  . spironolactone  12.5 mg Oral Daily   Continuous Infusions: . sodium chloride       LOS: 0 days    Time spent:    Zannie Cove, MD [facility] 02/24/2019, 11:17 AM

## 2019-02-24 NOTE — Progress Notes (Signed)
Pt admitted to 3e27 from [place] ED. Admissions paged for MD admit/orders. BP 177/130. Will continue to monitor.

## 2019-02-24 NOTE — Plan of Care (Signed)
Nutrition Education Note  RD consulted for nutrition education regarding CHF and pre-diabetes.  Lab Results  Component Value Date   HGBA1C 6.2 (H) 02/24/2019     Spoke with pt at bedside, who was pleasant and in good spirits at time of visit. He was able to voice to this RD rationale for hospitalization. He is not anxious for his cath ("I just wanna get it over with because I really wanted my chicken sandwich for lunch"). Pt reports that he usually eats one large meal and snacks throughout the day (pt works night shift at [employer]). Typical meal consists of either fried rice and stak sub from [employer], chicken fried rice from local [employer], or chicken caesar wrap or burger from [employer]. Pt shares that he has decreased fast food consumption to 1-2 times per month. His wife also prepares meals, such as chicken, mac and cheese, and green beans.   Pt reports that he was very healthy and active approximately 8 years ago and had great success maintaining his weight and healthy lifestyle. He reports "I was just stupid and fell off the wagon. I know sodas and stuff were not good for me, but I did them anyways". Pt had very thoughtful responses regarding modifying his diet and lifestyle moving forward. Focus on conversation was on monitoring weight daily (pt with scale at home) and how to decrease sodium and simple carbohydrates in diet.   RD provided "Heart Healthy, Consistent Carbohydrate Nutrition Therapy" handout from the Academy of Nutrition and Dietetics. Reviewed patient's dietary recall. Provided examples on ways to decrease sodium intake in diet. Discouraged intake of processed foods and use of salt shaker. Encouraged fresh fruits and vegetables as well as whole grain sources of carbohydrates to maximize fiber intake.   RD discussed why it is important for patient to adhere to diet recommendations, and emphasized the role of fluids, foods to avoid, and importance of weighing  self daily.   Discussed different food groups and their effects on blood sugar, emphasizing carbohydrate-containing foods. Provided list of carbohydrates and recommended serving sizes of common foods.  Discussed importance of controlled and consistent carbohydrate intake throughout the day. Provided examples of ways to balance meals/snacks and encouraged intake of high-fiber, whole grain complex carbohydrates. Teach back method used.  Expect fair to good compliance.  Body mass index is 37.4 kg/m. Pt meets criteria for obesity, class II based on current BMI.  Current diet order is NPO (previous 2 grams sodium with 1.5 L fluid restriction), patient is consuming approximately 100% of meals at this time. Labs and medications reviewed. No further nutrition interventions warranted at this time. RD contact information provided. If additional nutrition issues arise, please re-consult RD.   Ottie Neglia A. Jimmye Norman, RD, LDN, [place] Registered Dietitian II Certified Diabetes Care and Education Specialist Pager: [phone] After hours Pager: [phone]

## 2019-02-24 NOTE — Progress Notes (Signed)
TR band removed at 2130 Site level 0- arm warm and dry  No complications with deflation,  Patient educated about resting his arm and to alert staff if begins to bleed or have pain or numbness at site

## 2019-02-24 NOTE — Progress Notes (Signed)
Patient ID: Derek Reed, male   DOB: 10-29-1985, 33 y.o.   MRN: [id number]    Advanced Heart Failure Team Consult Note   Primary Physician: Patient, No Pcp Per PCP-Cardiologist:  No primary care provider on file.  Reason for Consultation: CHF, new onset  HPI:    Derek Reed is seen today for evaluation of CHF at the request of Dr. Blaine Hamper.   33 y.o. with history of untreated HTN was admitted with dyspnea, found to have CHF exacerbation.  Patient has known about HTN for several years but has not been on medications.  He has generally done well, works in the [employer] at a [place]. 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 He also noted left-sided chest tightness along with the dyspnea.  He then got to the point where he was short of breath with just walking up the stairs in his house.  He developed a cough.  He went to the ER once, was tested for COVID-19 (negative) and sent home.  He went back to the ER last night with ongoing dyspnea, CXR showed pulmonary edema and BNP was high.  HS-TnI 153 => 180.  BP markedly high.  He was started on IV Lasix and amlodipine and admitted.   He does not smoke or drink ETOH.  No drugs.  His mother has "heart disease" but he is unclear of the diagnosis.   Echo was done today and reviewed.  EF 25-30% with mild LV dilation and mild LV hypertrophy.  Mildly decreased RV systolic function.   He is less short of breath after getting IV Lasix, good diuresis.  BP remains elevated.   Review of Systems: All systems reviewed and negative except as per HPI.   Home Medications Prior to Admission medications   Medication Sig Start Date End Date Taking? Authorizing Provider  benzonatate (TESSALON) 100 MG capsule Take 1 capsule (100 mg total) by mouth every 8 (eight) hours as needed for cough. 02/15/19   Albrizze, Harley Hallmark, PA-C    Past Medical History: Past Medical History:   Diagnosis Date  . Anxiety   . Peritonsillar abscess     Past Surgical History: 1. Anxiety.  2. Peri-tonsillar abscess.  3. HTN: On no meds at home.   Family History: Family History  Problem Relation Age of Onset  . Hyperlipidemia Mother   . Diabetes Mother   . Stroke Mother   . Hypertension Mother   . Hypertension Father     Social History: Social History   Socioeconomic History  . Marital status: Single    Spouse name: Not on file  . Number of children: Not on file  . Years of education: Not on file  . Highest education level: Not on file  Occupational History  . Not on file  Social Needs  . Financial resource strain: Not on file  . Food insecurity    Worry: Not on file    Inability: Not on file  . Transportation needs    Medical: Not on file    Non-medical: Not on file  Tobacco Use  . Smoking status: Former Research scientist (life sciences)  . Smokeless tobacco: Never Used  . Tobacco comment: quit in January  Substance and Sexual Activity  . Alcohol use: No  . Drug use: No  . Sexual activity: Not on file  Lifestyle  . Physical activity    Days per week: Not on file  Minutes per session: Not on file  . Stress: Not on file  Relationships  . Social connections    Talks on phone: Not on file    Gets together: Not on file    Attends religious service: Not on file    Active member of club or organization: Not on file    Attends meetings of clubs or organizations: Not on file    Relationship status: Not on file  Other Topics Concern  . Not on file  Social History Narrative  . Not on file    Allergies:  No Known Allergies  Objective:    Vital Signs:   Temp:  [97.9 F (36.6 C)-99.4 F (37.4 C)] 99.2 F (37.3 C) (10/16 0750) Pulse Rate:  [46-102] 51 (10/16 0750) Resp:  [16-29] 18 (10/16 0750) BP: (142-207)/(101-140) 156/119 (10/16 0750) SpO2:  [95 %-100 %] 100 % (10/16 0750) Weight:  [105.1 kg-106.6 kg] 105.1 kg (10/16 0528) Last BM Date: 02/23/19  Weight change:  Filed Weights   02/23/19 1406 02/24/19 0030 02/24/19 0528  Weight: 106.6 kg 106.2 kg 105.1 kg    Intake/Output:   Intake/Output Summary (Last 24 hours) at 02/24/2019 1101 Last data filed at 02/24/2019 0914 Gross per 24 hour  Intake 920 ml  Output 5100 ml  Net -4180 ml      Physical Exam    General:  Well appearing. No resp difficulty HEENT: normal Neck: supple. JVP 12-13 cm. Carotids 2+ bilat; no bruits. No lymphadenopathy or thyromegaly appreciated. Cor: PMI nondisplaced. Regular rate & rhythm. No rubs or murmurs. Soft S4.  Lungs: clear Abdomen: soft, nontender, nondistended. No hepatosplenomegaly. No bruits or masses. Good bowel sounds. Extremities: no cyanosis, clubbing, rash, edema Neuro: alert & orientedx3, cranial nerves grossly intact. moves all 4 extremities w/o difficulty. Affect pleasant   Telemetry   NSR 90s (personally reviewed)  EKG    NSR, LVH with repolarization abnormality (personally reviewed)  Labs   Basic Metabolic Panel: Recent Labs  Lab 02/23/19 1629 02/24/19 0338  NA 140  --   K 3.1*  --   CL 106  --   CO2 23  --   GLUCOSE 95  --   BUN 16  --   CREATININE 1.39*  --   CALCIUM 8.7*  --   MG  --  2.1    Liver Function Tests: No results for input(s): AST, ALT, ALKPHOS, BILITOT, PROT, ALBUMIN in the last 168 hours. No results for input(s): LIPASE, AMYLASE in the last 168 hours. No results for input(s): AMMONIA in the last 168 hours.  CBC: Recent Labs  Lab 02/23/19 1629  WBC 7.5  NEUTROABS 3.7  HGB 12.1*  HCT 38.9*  MCV 72.6*  PLT 280    Cardiac Enzymes: No results for input(s): CKTOTAL, CKMB, CKMBINDEX, TROPONINI in the last 168 hours.  BNP: BNP (last 3 results) Recent Labs    02/23/19 1629  BNP 882.4*    ProBNP (last 3 results) No results for input(s): PROBNP in the last 8760 hours.   CBG: No results for input(s): GLUCAP in the last 168 hours.  Coagulation Studies: No results for input(s): LABPROT, INR in the  last 72 hours.   Imaging   Dg Chest Port 1 View  Result Date: 02/23/2019 CLINICAL DATA:  Shortness of breath. EXAM: PORTABLE CHEST 1 VIEW COMPARISON:  02/15/2019 FINDINGS: Enlarged cardiac silhouette.  Mediastinal contours appear intact. Interstitial pulmonary edema. Osseous structures are without acute abnormality. Soft tissues are grossly normal. IMPRESSION: Enlarged cardiac   silhouette, which may be due to cardiomyopathy or pericardial effusion. Interstitial pulmonary edema. Electronically Signed   By: Ted Mcalpine M.D.   On: 02/23/2019 16:37      Medications:     Current Medications: . aspirin EC  81 mg Oral Daily  . atorvastatin  40 mg Oral q1800  . enoxaparin (LOVENOX) injection  40 mg Subcutaneous Q24H  . furosemide  40 mg Intravenous Q12H  . potassium chloride  40 mEq Oral BID  . sacubitril-valsartan  1 tablet Oral BID  . sodium chloride flush  3 mL Intravenous Q12H  . spironolactone  12.5 mg Oral Daily     Infusions: . sodium chloride        Assessment/Plan   1. Acute systolic CHF: Echo this admission with EF 25-30% with mild LV dilation and mild LV hypertrophy, mildly decreased RV systolic function.  He had pulmonary edema on CXR.  Cause of cardiomyopathy may be long-standing uncontrolled HTN.  He also gets chest pain with exertion and has had mildly elevated troponin (though most likely demand ischemia from volume overload/HTN).  No drugs/ETOH/smoking.  Mother with "heart disease" but he is not clear on the diagnosis.  On exam, he is volume overloaded still.   - Continue Lasix 40 mg IV bid for now and replace K.   - I will stop amlodipine and start him on Entresto 24/26 bid.  - Start spironolactone 12.5 mg daily.  - Start beta blocker when volume is better controlled (possibly tomorrow).  - Bidil eventually.  - With chest pain and mildly elevated troponin in setting of new cardiomyopathy, think we will need to rule out coronary disease.  Will keep him NPO  and plan LHC/RHC later this afternoon.  Discussed risks/benefits with patient and he agrees to procedure.  2. Hypertensive urgency/emergency: Patient admitted with markedly elevated BP in setting of acute CHF.  BP trending down slowly. He has a history of HTN, not on meds.  - As above, will stop amlodipine and start Entresto + spironolactone given low EF.  Beta blocker as next step when volume better.   Length of Stay: 0  Marca Ancona, MD  02/24/2019, 11:01 AM  Advanced Heart Failure Team Pager [phone] (M-F; 7a - 4p)  Please contact [facility] for night-coverage after hours (4p -7a ) and weekends on [email or web address]

## 2019-02-24 NOTE — H&P (Signed)
History and Physical    Derek Reed ZOX:[id number] DOB: 07/05/1985 DOA: 02/23/2019  Referring MD/NP/PA:   PCP: Patient, No Pcp Per   Patient coming from:  The patient is coming from home.  At baseline, pt is independent for most of ADL.        Chief Complaint: SOB  HPI: Derek Reed is a 33 y.o. male with medical history significant of hypertension not taking medications, CKD-2, peritonsillar abscess, anxiety, who presents with shortness of breath.  Patient states that he has been having shortness of breath for more than 2 weeks.  He has dry cough.  He had some mild chest pain recently, but currently no chest pain.  Denies fever or chills.  No recent long distant traveling.  No tenderness in the calf areas.  Patient does not have nausea, vomiting, diarrhea, abdominal pain, symptoms of UTI or unilateral weakness. Pt was seen in ED 02/15/2019 at which time COVID-19 test was negative.    ED Course: pt was found to have troponin I 56, 153, WBC 7.5, BMP 882.4, pending COVID-19 test, worsening renal function, potassium 3.1, temperature 99.4, elevated blood pressure 207/140, 177/130, heart rate of 46-102, oxygen saturation 95-100% on room air.  Chest x-ray showed increased cardiac silhouette and interstitial edema.  Patient is placed on telemetry bed of observation.  Review of Systems:   General: no fevers, chills, no body weight gain, has fatigue HEENT: no blurry vision, hearing changes or sore throat Respiratory: has dyspnea, coughing, no wheezing CV: had chest pain, no palpitations GI: no nausea, vomiting, abdominal pain, diarrhea, constipation GU: no dysuria, burning on urination, increased urinary frequency, hematuria  Ext: no leg edema Neuro: no unilateral weakness, numbness, or tingling, no vision change or hearing loss Skin: no rash, no skin tear. MSK: No muscle spasm, no deformity, no limitation of range of movement in spin Heme: No easy bruising.  Travel history: No recent long  distant travel.  Allergy: No Known Allergies  Past Medical History:  Diagnosis Date  . Anxiety   . Peritonsillar abscess     Past Surgical History:  Procedure Laterality Date  . INCISION AND DRAINAGE OF PERITONSILLAR ABCESS      Social History:  reports that he has quit smoking. He has never used smokeless tobacco. He reports that he does not drink alcohol or use drugs.  Family History:  Family History  Problem Relation Age of Onset  . Hyperlipidemia Mother   . Diabetes Mother   . Stroke Mother   . Hypertension Mother   . Hypertension Father      Prior to Admission medications   Medication Sig Start Date End Date Taking? Authorizing Provider  benzonatate (TESSALON) 100 MG capsule Take 1 capsule (100 mg total) by mouth every 8 (eight) hours as needed for cough. 02/15/19   Sherene SiresAlbrizze, Kaitlyn E, PA-C    Physical Exam: Vitals:   02/23/19 2100 02/23/19 2305 02/24/19 0030 02/24/19 0528  BP: (!) 143/111 (!) 162/124 (!) 177/130 (!) 173/123  Pulse: 98 93 92 94  Resp: 20 (!) 29 16 17   Temp:   98.3 F (36.8 C) 97.9 F (36.6 C)  TempSrc:   Oral Oral  SpO2: 96% 99% 100% 100%  Weight:   106.2 kg 105.1 kg  Height:   5\' 6"  (1.676 m)    General: Not in acute distress HEENT:       Eyes: PERRL, EOMI, no scleral icterus.       ENT: No discharge from the ears and  nose, no pharynx injection, no tonsillar enlargement.        Neck: Difficult to assess JVD due to obesity. no bruit, no mass felt. Heme: No neck lymph node enlargement. Cardiac: S1/S2, RRR, No murmurs, No gallops or rubs. Respiratory:  No rales, wheezing, rhonchi or rubs. GI: Soft, nondistended, nontender, no rebound pain, no organomegaly, BS present. GU: No hematuria Ext: No pitting leg edema bilaterally. 2+DP/PT pulse bilaterally. Musculoskeletal: No joint deformities, No joint redness or warmth, no limitation of ROM in spin. Skin: No rashes.  Neuro: Alert, oriented X3, cranial nerves II-XII grossly intact, moves all  extremities normally.  Psych: Patient is not psychotic, no suicidal or hemocidal ideation.  Labs on Admission: I have personally reviewed following labs and imaging studies  CBC: Recent Labs  Lab 02/23/19 1629  WBC 7.5  NEUTROABS 3.7  HGB 12.1*  HCT 38.9*  MCV 72.6*  PLT 280   Basic Metabolic Panel: Recent Labs  Lab 02/23/19 1629 02/24/19 0338  NA 140  --   K 3.1*  --   CL 106  --   CO2 23  --   GLUCOSE 95  --   BUN 16  --   CREATININE 1.39*  --   CALCIUM 8.7*  --   MG  --  2.1   GFR: Estimated Creatinine Clearance: 84.2 mL/min (A) (by C-G formula based on SCr of 1.39 mg/dL (H)). Liver Function Tests: No results for input(s): AST, ALT, ALKPHOS, BILITOT, PROT, ALBUMIN in the last 168 hours. No results for input(s): LIPASE, AMYLASE in the last 168 hours. No results for input(s): AMMONIA in the last 168 hours. Coagulation Profile: No results for input(s): INR, PROTIME in the last 168 hours. Cardiac Enzymes: No results for input(s): CKTOTAL, CKMB, CKMBINDEX, TROPONINI in the last 168 hours. BNP (last 3 results) No results for input(s): PROBNP in the last 8760 hours. HbA1C: Recent Labs    02/24/19 0338  HGBA1C 6.2*   CBG: No results for input(s): GLUCAP in the last 168 hours. Lipid Profile: Recent Labs    02/24/19 0338  CHOL 183  HDL 31*  LDLCALC 139*  TRIG 65  CHOLHDL 5.9   Thyroid Function Tests: No results for input(s): TSH, T4TOTAL, FREET4, T3FREE, THYROIDAB in the last 72 hours. Anemia Panel: No results for input(s): VITAMINB12, FOLATE, FERRITIN, TIBC, IRON, RETICCTPCT in the last 72 hours. Urine analysis:    Component Value Date/Time   COLORURINE YELLOW 09/20/2015 2100   APPEARANCEUR CLEAR 09/20/2015 2100   LABSPEC 1.028 09/20/2015 2100   PHURINE 6.5 09/20/2015 2100   GLUCOSEU NEGATIVE 09/20/2015 2100   HGBUR NEGATIVE 09/20/2015 2100   BILIRUBINUR NEGATIVE 09/20/2015 2100   KETONESUR NEGATIVE 09/20/2015 2100   PROTEINUR 30 (A) 09/20/2015  2100   NITRITE NEGATIVE 09/20/2015 2100   LEUKOCYTESUR NEGATIVE 09/20/2015 2100   Sepsis Labs: @LABRCNTIP (procalcitonin:4,lacticidven:4) ) Recent Results (from the past 240 hour(s))  SARS CORONAVIRUS 2 (TAT 6-24 HRS) Nasopharyngeal Nasopharyngeal Swab     Status: None   Collection Time: 02/15/19  3:39 PM   Specimen: Nasopharyngeal Swab  Result Value Ref Range Status   SARS Coronavirus 2 NEGATIVE NEGATIVE Final    Comment: (NOTE) SARS-CoV-2 target nucleic acids are NOT DETECTED. The SARS-CoV-2 RNA is generally detectable in upper and lower respiratory specimens during the acute phase of infection. Negative results do not preclude SARS-CoV-2 infection, do not rule out co-infections with other pathogens, and should not be used as the sole basis for treatment or other patient management decisions.  Negative results must be combined with clinical observations, patient history, and epidemiological information. The expected result is Negative. Fact Sheet for Patients: SugarRoll.be Fact Sheet for Healthcare Providers: https://www.woods-mathews.com/ This test is not yet approved or cleared by the [place] FDA and  has been authorized for detection and/or diagnosis of SARS-CoV-2 by FDA under an Emergency Use Authorization (EUA). This EUA will remain  in effect (meaning this test can be used) for the duration of the COVID-19 declaration under Section 56 4(b)(1) of the Act, 21 U.S.C. section 360bbb-3(b)(1), unless the authorization is terminated or revoked sooner. Performed at [facility], [place] [address] [place], [place] [address]   SARS CORONAVIRUS 2 (TAT 6-24 HRS) Nasopharyngeal Nasopharyngeal Swab     Status: None   Collection Time: 02/23/19  6:57 PM   Specimen: Nasopharyngeal Swab  Result Value Ref Range Status   SARS Coronavirus 2 NEGATIVE NEGATIVE Final    Comment: (NOTE) SARS-CoV-2 target nucleic acids are NOT DETECTED. The  SARS-CoV-2 RNA is generally detectable in upper and lower respiratory specimens during the acute phase of infection. Negative results do not preclude SARS-CoV-2 infection, do not rule out co-infections with other pathogens, and should not be used as the sole basis for treatment or other patient management decisions. Negative results must be combined with clinical observations, patient history, and epidemiological information. The expected result is Negative. Fact Sheet for Patients: SugarRoll.be Fact Sheet for Healthcare Providers: https://www.woods-mathews.com/ This test is not yet approved or cleared by the [place] FDA and  has been authorized for detection and/or diagnosis of SARS-CoV-2 by FDA under an Emergency Use Authorization (EUA). This EUA will remain  in effect (meaning this test can be used) for the duration of the COVID-19 declaration under Section 56 4(b)(1) of the Act, 21 U.S.C. section 360bbb-3(b)(1), unless the authorization is terminated or revoked sooner. Performed at [facility], [place] [address], [place], [place] [address]      Radiological Exams on Admission: Dg Chest Port 1 View  Result Date: 02/23/2019 CLINICAL DATA:  Shortness of breath. EXAM: PORTABLE CHEST 1 VIEW COMPARISON:  02/15/2019 FINDINGS: Enlarged cardiac silhouette.  Mediastinal contours appear intact. Interstitial pulmonary edema. Osseous structures are without acute abnormality. Soft tissues are grossly normal. IMPRESSION: Enlarged cardiac silhouette, which may be due to cardiomyopathy or pericardial effusion. Interstitial pulmonary edema. Electronically Signed   By: Fidela Salisbury M.D.   On: 02/23/2019 16:37     EKG: Independently reviewed.  Sinus rhythm, QTC 481, LAE, poor R wave progression, LAD, nonspecific T wave change.  Assessment/Plan Principal Problem:   Acute CHF (congestive heart failure) (HCC) Active Problems:   Hypokalemia    HTN (hypertension)   Elevated troponin   CKD (chronic kidney disease), stage II   Anxiety   Possible new onset aute CHF (congestive heart failure) ([place]): Patient does not have leg edema on examination, but he has elevated BNP 882, and interstitial pulmonary edema on chest x-ray indicating possible CHF exacerbation.  Not sure which type of CHF.  Will get 2D echo for further evaluation.  -will place on tele bed for obs -Lasix 20 mg bid by IV (patient received 40 mg Lasix in the ED) -2d echo -Daily weights -strict I/O's -Low salt diet -Fluid restriction - card consult is requested via Epic  Elevated troponin: Troponin 156 -->153.  Currently no chest pain.  EKG does not show significant extremity change. Likely due to demand ischemia secondary to acute CHF. -Trend troponin -Aspirin -Lipitor 40  mg daily -2D echo as above  Hypokalemia: K= 3.1  on admission. - Repleted - Check Mg level  HTN (hypertension): bp 207/140 -->177/130. Not taking meds currently -start amlodipine 10 mg daily -Patient is on IV Lasix -As needed hydralazine orally  CKD-II: Baseline creatinine 1.2.  His creatinine is 1.39, BUN 16.  Slightly worsening. -f/u by BMP  Anxiety: -prn hydroxyzine       DVT ppx:  SQ Lovenox Code Status: Full code Family Communication: None at bed side.    Disposition Plan:  Anticipate discharge back to previous home environment Consults called:  none Admission status: Obs / tele    Date of Service 02/24/2019    Lorretta Harp [facility]   If 7PM-7AM, please contact night-coverage [email or web address] Password [email or web address] 02/24/2019, 7:46 AM

## 2019-02-24 NOTE — H&P (View-Only) (Signed)
Patient ID: Derek Reed, male   DOB: 10-29-1985, 33 y.o.   MRN: [id number]    Advanced Heart Failure Team Consult Note   Primary Physician: Patient, No Pcp Per PCP-Cardiologist:  No primary care provider on file.  Reason for Consultation: CHF, new onset  HPI:    Derek Reed is seen today for evaluation of CHF at the request of Dr. Blaine Hamper.   33 y.o. with history of untreated HTN was admitted with dyspnea, found to have CHF exacerbation.  Patient has known about HTN for several years but has not been on medications.  He has generally done well, works in the [employer] at a [place]. 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 He also noted left-sided chest tightness along with the dyspnea.  He then got to the point where he was short of breath with just walking up the stairs in his house.  He developed a cough.  He went to the ER once, was tested for COVID-19 (negative) and sent home.  He went back to the ER last night with ongoing dyspnea, CXR showed pulmonary edema and BNP was high.  HS-TnI 153 => 180.  BP markedly high.  He was started on IV Lasix and amlodipine and admitted.   He does not smoke or drink ETOH.  No drugs.  His mother has "heart disease" but he is unclear of the diagnosis.   Echo was done today and reviewed.  EF 25-30% with mild LV dilation and mild LV hypertrophy.  Mildly decreased RV systolic function.   He is less short of breath after getting IV Lasix, good diuresis.  BP remains elevated.   Review of Systems: All systems reviewed and negative except as per HPI.   Home Medications Prior to Admission medications   Medication Sig Start Date End Date Taking? Authorizing Provider  benzonatate (TESSALON) 100 MG capsule Take 1 capsule (100 mg total) by mouth every 8 (eight) hours as needed for cough. 02/15/19   Albrizze, Harley Hallmark, PA-C    Past Medical History: Past Medical History:   Diagnosis Date  . Anxiety   . Peritonsillar abscess     Past Surgical History: 1. Anxiety.  2. Peri-tonsillar abscess.  3. HTN: On no meds at home.   Family History: Family History  Problem Relation Age of Onset  . Hyperlipidemia Mother   . Diabetes Mother   . Stroke Mother   . Hypertension Mother   . Hypertension Father     Social History: Social History   Socioeconomic History  . Marital status: Single    Spouse name: Not on file  . Number of children: Not on file  . Years of education: Not on file  . Highest education level: Not on file  Occupational History  . Not on file  Social Needs  . Financial resource strain: Not on file  . Food insecurity    Worry: Not on file    Inability: Not on file  . Transportation needs    Medical: Not on file    Non-medical: Not on file  Tobacco Use  . Smoking status: Former Research scientist (life sciences)  . Smokeless tobacco: Never Used  . Tobacco comment: quit in January  Substance and Sexual Activity  . Alcohol use: No  . Drug use: No  . Sexual activity: Not on file  Lifestyle  . Physical activity    Days per week: Not on file  Minutes per session: Not on file  . Stress: Not on file  Relationships  . Social Musician on phone: Not on file    Gets together: Not on file    Attends religious service: Not on file    Active member of club or organization: Not on file    Attends meetings of clubs or organizations: Not on file    Relationship status: Not on file  Other Topics Concern  . Not on file  Social History Narrative  . Not on file    Allergies:  No Known Allergies  Objective:    Vital Signs:   Temp:  [97.9 F (36.6 C)-99.4 F (37.4 C)] 99.2 F (37.3 C) (10/16 0750) Pulse Rate:  [46-102] 51 (10/16 0750) Resp:  [16-29] 18 (10/16 0750) BP: (142-207)/(101-140) 156/119 (10/16 0750) SpO2:  [95 %-100 %] 100 % (10/16 0750) Weight:  [105.1 kg-106.6 kg] 105.1 kg (10/16 0528) Last BM Date: 02/23/19  Weight change:  Filed Weights   02/23/19 1406 02/24/19 0030 02/24/19 0528  Weight: 106.6 kg 106.2 kg 105.1 kg    Intake/Output:   Intake/Output Summary (Last 24 hours) at 02/24/2019 1101 Last data filed at 02/24/2019 0914 Gross per 24 hour  Intake 920 ml  Output 5100 ml  Net -4180 ml      Physical Exam    General:  Well appearing. No resp difficulty HEENT: normal Neck: supple. JVP 12-13 cm. Carotids 2+ bilat; no bruits. No lymphadenopathy or thyromegaly appreciated. Cor: PMI nondisplaced. Regular rate & rhythm. No rubs or murmurs. Soft S4.  Lungs: clear Abdomen: soft, nontender, nondistended. No hepatosplenomegaly. No bruits or masses. Good bowel sounds. Extremities: no cyanosis, clubbing, rash, edema Neuro: alert & orientedx3, cranial nerves grossly intact. moves all 4 extremities w/o difficulty. Affect pleasant   Telemetry   NSR 90s (personally reviewed)  EKG    NSR, LVH with repolarization abnormality (personally reviewed)  Labs   Basic Metabolic Panel: Recent Labs  Lab 02/23/19 1629 02/24/19 0338  NA 140  --   K 3.1*  --   CL 106  --   CO2 23  --   GLUCOSE 95  --   BUN 16  --   CREATININE 1.39*  --   CALCIUM 8.7*  --   MG  --  2.1    Liver Function Tests: No results for input(s): AST, ALT, ALKPHOS, BILITOT, PROT, ALBUMIN in the last 168 hours. No results for input(s): LIPASE, AMYLASE in the last 168 hours. No results for input(s): AMMONIA in the last 168 hours.  CBC: Recent Labs  Lab 02/23/19 1629  WBC 7.5  NEUTROABS 3.7  HGB 12.1*  HCT 38.9*  MCV 72.6*  PLT 280    Cardiac Enzymes: No results for input(s): CKTOTAL, CKMB, CKMBINDEX, TROPONINI in the last 168 hours.  BNP: BNP (last 3 results) Recent Labs    02/23/19 1629  BNP 882.4*    ProBNP (last 3 results) No results for input(s): PROBNP in the last 8760 hours.   CBG: No results for input(s): GLUCAP in the last 168 hours.  Coagulation Studies: No results for input(s): LABPROT, INR in the  last 72 hours.   Imaging   Dg Chest Port 1 View  Result Date: 02/23/2019 CLINICAL DATA:  Shortness of breath. EXAM: PORTABLE CHEST 1 VIEW COMPARISON:  02/15/2019 FINDINGS: Enlarged cardiac silhouette.  Mediastinal contours appear intact. Interstitial pulmonary edema. Osseous structures are without acute abnormality. Soft tissues are grossly normal. IMPRESSION: Enlarged cardiac  silhouette, which may be due to cardiomyopathy or pericardial effusion. Interstitial pulmonary edema. Electronically Signed   By: Ted Mcalpine M.D.   On: 02/23/2019 16:37      Medications:     Current Medications: . aspirin EC  81 mg Oral Daily  . atorvastatin  40 mg Oral q1800  . enoxaparin (LOVENOX) injection  40 mg Subcutaneous Q24H  . furosemide  40 mg Intravenous Q12H  . potassium chloride  40 mEq Oral BID  . sacubitril-valsartan  1 tablet Oral BID  . sodium chloride flush  3 mL Intravenous Q12H  . spironolactone  12.5 mg Oral Daily     Infusions: . sodium chloride        Assessment/Plan   1. Acute systolic CHF: Echo this admission with EF 25-30% with mild LV dilation and mild LV hypertrophy, mildly decreased RV systolic function.  He had pulmonary edema on CXR.  Cause of cardiomyopathy may be long-standing uncontrolled HTN.  He also gets chest pain with exertion and has had mildly elevated troponin (though most likely demand ischemia from volume overload/HTN).  No drugs/ETOH/smoking.  Mother with "heart disease" but he is not clear on the diagnosis.  On exam, he is volume overloaded still.   - Continue Lasix 40 mg IV bid for now and replace K.   - I will stop amlodipine and start him on Entresto 24/26 bid.  - Start spironolactone 12.5 mg daily.  - Start beta blocker when volume is better controlled (possibly tomorrow).  - Bidil eventually.  - With chest pain and mildly elevated troponin in setting of new cardiomyopathy, think we will need to rule out coronary disease.  Will keep him NPO  and plan LHC/RHC later this afternoon.  Discussed risks/benefits with patient and he agrees to procedure.  2. Hypertensive urgency/emergency: Patient admitted with markedly elevated BP in setting of acute CHF.  BP trending down slowly. He has a history of HTN, not on meds.  - As above, will stop amlodipine and start Entresto + spironolactone given low EF.  Beta blocker as next step when volume better.   Length of Stay: 0  Marca Ancona, MD  02/24/2019, 11:01 AM  Advanced Heart Failure Team Pager [phone] (M-F; 7a - 4p)  Please contact [facility] for night-coverage after hours (4p -7a ) and weekends on [email or web address]

## 2019-02-25 LAB — BASIC METABOLIC PANEL
Anion gap: 11 (ref 5–15)
BUN: 10 mg/dL (ref 6–20)
CO2: 22 mmol/L (ref 22–32)
Calcium: 8.9 mg/dL (ref 8.9–10.3)
Chloride: 103 mmol/L (ref 98–111)
Creatinine, Ser: 1.33 mg/dL — ABNORMAL HIGH (ref 0.61–1.24)
GFR calc Af Amer: >60 mL/min (ref >60)
GFR calc non Af Amer: >60 mL/min (ref >60)
Glucose, Bld: 108 mg/dL — ABNORMAL HIGH (ref 70–99)
Potassium: 3.6 mmol/L (ref 3.5–5.1)
Sodium: 136 mmol/L (ref 135–145)

## 2019-02-25 LAB — MAGNESIUM: Magnesium: 2 mg/dL (ref 1.7–2.4)

## 2019-02-25 MED ORDER — CARVEDILOL 3.125 MG PO TABS
3.1250 mg | ORAL_TABLET | Freq: Two times a day (BID) | ORAL | Status: DC
  Administered 2019-02-25 – 2019-02-26 (×3): 3.125 mg via ORAL
  Filled 2019-02-25 (×3): qty 1

## 2019-02-25 MED ORDER — SPIRONOLACTONE 12.5 MG HALF TABLET
12.5000 mg | ORAL_TABLET | Freq: Once | ORAL | Status: AC
  Administered 2019-02-25: 11:00:00 12.5 mg via ORAL
  Filled 2019-02-25: qty 1

## 2019-02-25 MED ORDER — SPIRONOLACTONE 25 MG PO TABS
25.0000 mg | ORAL_TABLET | Freq: Every day | ORAL | Status: DC
  Administered 2019-02-26: 25 mg via ORAL
  Filled 2019-02-25: qty 1

## 2019-02-25 NOTE — Progress Notes (Signed)
PROGRESS NOTE    Derek Reed  WCB:[id number] DOB: 10/05/1985 DOA: 02/23/2019 PCP: Patient, No Pcp Per  Brief Narrative: Derek Reed is a 33 y.o. male with medical history significant of hypertension not taking medications, CKD-2, peritonsillar abscess, anxiety, who presented with shortness of breath, for more than 2 weeks.  He has dry cough. - COVID-19 test was negative, EKG noted LVH Chest x-ray showed increased cardiac silhouette and interstitial edema   Assessment & Plan:   Acute CHF/new onset -Suspect hypertensive cardiomyopathy, has LVH on EKG -2D echo with EF of 25 to 30%, underwent left heart cath yesterday which showed minimal nonobstructive disease, consistent with nonischemic cardiomyopathy -Cardiology following, transition to oral Lasix today, and starting Entresto,-patient is uninsured will ask case management to consult -He is -4.5 L and feels much better -Dietitian consulted -Discharge planning, home tomorrow if stable  Elevated troponin:  -Troponin 156 -->153. 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    Disposition Plan:   Home tomorrow  Consultants:   Cardiology   Procedures:   Antimicrobials:    Subjective: -Much better, breathing is improved, ambulating without much distress  Objective: Vitals:   02/25/19 0131 02/25/19 0400 02/25/19 0616 02/25/19 0841  BP: 127/90 124/77 127/85 (!) 141/95  Pulse:  90 73 97  Resp:  16 18  18   Temp: 98 F (36.7 C) 98 F (36.7 C) 98 F (36.7 C)   TempSrc: Oral Oral Oral   SpO2: 99% 99% 100%   Weight:   108.8 kg   Height:        Intake/Output Summary (Last 24 hours) at 02/25/2019 1116 Last data filed at 02/25/2019 0900 Gross per 24 hour  Intake 1491.05 ml  Output 1200 ml  Net 291.05 ml   Filed Weights   02/24/19 0030 02/24/19 0528 02/25/19 0616  Weight: 106.2 kg 105.1 kg 108.8 kg    Examination:  General exam: Appears calm and comfortable  Respiratory system: Rare basilar crackles Cardiovascular system: S1 & S2 heard, RRR.  Gastrointestinal system: Abdomen is nondistended, soft and nontender.Normal bowel sounds heard. Central nervous system: Alert and oriented. No focal neurological deficits. Extremities: No edema Skin: No rashes, lesions or ulcers Psychiatry: Judgement and insight appear normal. Mood & affect appropriate.     Data Reviewed:   CBC: Recent Labs  Lab 02/23/19 1629 02/24/19 1634 02/24/19 1805  WBC 7.5  --  7.0  NEUTROABS 3.7  --   --   HGB 12.1* 15.3 14.2  HCT 38.9* 45.0 44.3  MCV 72.6*  --  72.0*  PLT 280  --  616   Basic Metabolic Panel: Recent Labs  Lab 02/23/19 1629 02/24/19 0338 02/24/19 1634 02/24/19 1805 02/25/19 0502  NA 140  --  143  --  136  K 3.1*  --  3.3*  --  3.6  CL 106  --   --   --  103  CO2 23  --   --   --  22  GLUCOSE 95  --   --   --  108*  BUN 16  --   --   --  10  CREATININE 1.39*  --   --  1.43* 1.33*  CALCIUM 8.7*  --   --   --  8.9  MG  --  2.1  --   --  2.0   GFR: Estimated Creatinine Clearance: 89.7 mL/min (A) (by C-G formula based on SCr of 1.33 mg/dL (H)). Liver Function Tests: No results for input(s): AST, ALT, ALKPHOS, BILITOT, PROT, ALBUMIN in the last 168 hours. No results for input(s): LIPASE, AMYLASE in the last 168 hours. No results for input(s): AMMONIA in the last 168 hours. Coagulation Profile: No results for input(s): INR, PROTIME in the last 168 hours. Cardiac Enzymes: No  results for input(s): CKTOTAL, CKMB, CKMBINDEX, TROPONINI in the last 168 hours. BNP (last 3 results) No results for input(s): PROBNP in the last 8760 hours. HbA1C: Recent Labs    02/24/19 0338  HGBA1C 6.2*   CBG: No results for input(s): GLUCAP in the last 168 hours. Lipid Profile: Recent Labs    02/24/19 0338  CHOL 183  HDL 31*  LDLCALC 139*  TRIG 65  CHOLHDL 5.9   Thyroid Function Tests: No results for input(s): TSH, T4TOTAL, FREET4, T3FREE, THYROIDAB in the last 72 hours. Anemia Panel: No results for input(s): VITAMINB12, FOLATE, FERRITIN, TIBC, IRON, RETICCTPCT in the last 72 hours. Urine analysis:    Component Value Date/Time   COLORURINE YELLOW 02/24/2019 1125   APPEARANCEUR CLEAR 02/24/2019 1125   LABSPEC 1.014 02/24/2019 1125   PHURINE 6.0 02/24/2019 1125   GLUCOSEU 50 (A) 02/24/2019 1125   HGBUR SMALL (A) 02/24/2019 1125   BILIRUBINUR NEGATIVE 02/24/2019 1125   KETONESUR NEGATIVE 02/24/2019 1125   PROTEINUR 100 (A) 02/24/2019 1125   NITRITE NEGATIVE 02/24/2019 1125   LEUKOCYTESUR NEGATIVE 02/24/2019 1125   Sepsis Labs: @LABRCNTIP (procalcitonin:4,lacticidven:4)  ) Recent Results (from the past 240 hour(s))  SARS CORONAVIRUS 2 (TAT 6-24 HRS) Nasopharyngeal Nasopharyngeal Swab     Status: None   Collection Time: 02/15/19  3:39 PM   Specimen: Nasopharyngeal Swab  Result Value Ref Range Status   SARS Coronavirus 2 NEGATIVE NEGATIVE Final    Comment: (NOTE) SARS-CoV-2 target nucleic acids are NOT DETECTED. The SARS-CoV-2 RNA is generally detectable in upper and lower respiratory specimens during the acute phase of infection. Negative results do not preclude SARS-CoV-2 infection, do not rule out co-infections with other pathogens, and should not be used as the sole basis for treatment or other patient management decisions. Negative results must be combined with clinical observations, patient history, and epidemiological information. The expected result is  Negative. Fact Sheet for Patients: HairSlick.no Fact Sheet for Healthcare Providers: quierodirigir.com This test is not yet approved or cleared by the [place] FDA and  has been authorized for detection and/or diagnosis of SARS-CoV-2 by FDA under an Emergency Use Authorization (EUA). This EUA will remain  in effect (meaning this test can be used) for the duration of the COVID-19 declaration under Section 56 4(b)(1) of the Act, 21 U.S.C. section 360bbb-3(b)(1), unless the authorization is terminated or revoked sooner. Performed at [facility], [address], [place], [place] [address]   SARS CORONAVIRUS 2 (TAT 6-24 HRS) Nasopharyngeal Nasopharyngeal Swab     Status: None   Collection Time: 02/23/19  6:57 PM   Specimen: Nasopharyngeal Swab  Result Value Ref Range Status   SARS Coronavirus 2 NEGATIVE NEGATIVE Final    Comment: (NOTE) SARS-CoV-2 target nucleic acids are NOT DETECTED. The  SARS-CoV-2 RNA is generally detectable in upper and lower respiratory specimens during the acute phase of infection. Negative results do not preclude SARS-CoV-2 infection, do not rule out co-infections with other pathogens, and should not be used as the sole basis for treatment or other patient management decisions. Negative results must be combined with clinical observations, patient history, and epidemiological information. The expected result is Negative. Fact Sheet for Patients: HairSlick.no Fact Sheet for Healthcare Providers: quierodirigir.com This test is not yet approved or cleared by the [place] FDA and  has been authorized for detection and/or diagnosis of SARS-CoV-2 by FDA under an Emergency Use Authorization (EUA). This EUA will remain  in effect (meaning this test can be used) for the duration of the COVID-19 declaration under Section 56 4(b)(1) of the Act, 21  U.S.C. section 360bbb-3(b)(1), unless the authorization is terminated or revoked sooner. Performed at [facility], [address], [place], [place] [address]          Radiology Studies: Dg Chest Port 1 View  Result Date: 02/23/2019 CLINICAL DATA:  Shortness of breath. EXAM: PORTABLE CHEST 1 VIEW COMPARISON:  02/15/2019 FINDINGS: Enlarged cardiac silhouette.  Mediastinal contours appear intact. Interstitial pulmonary edema. Osseous structures are without acute abnormality. Soft tissues are grossly normal. IMPRESSION: Enlarged cardiac silhouette, which may be due to cardiomyopathy or pericardial effusion. Interstitial pulmonary edema. Electronically Signed   By: Ted Mcalpine M.D.   On: 02/23/2019 16:37        Scheduled Meds: . aspirin EC  81 mg Oral Daily  . atorvastatin  40 mg Oral q1800  . carvedilol  3.125 mg Oral BID WC  . enoxaparin (LOVENOX) injection  40 mg Subcutaneous Q24H  . furosemide  40 mg Oral Daily  . potassium chloride  40 mEq Oral BID  . sacubitril-valsartan  1 tablet Oral BID  . sodium chloride flush  3 mL Intravenous Q12H  . sodium chloride flush  3 mL Intravenous Q12H  . [START ON 02/26/2019] spironolactone  25 mg Oral Daily   Continuous Infusions: . sodium chloride       LOS: 1 day    Time spent:    Zannie Cove, MD [facility] 02/25/2019, 11:16 AM

## 2019-02-25 NOTE — Progress Notes (Signed)
Advanced Heart Failure Rounding Note  PCP-Cardiologist: No primary care provider on file.   Subjective:    No complaints this morning, feeling better and BP trending down.   LHC/RHC (10/16):  Coronary Findings  Diagnostic Dominance: Right Left Main  No significant coronary disease.  Left Anterior Descending  No significant coronary disease.  Left Circumflex  No significant coronary disease.  Right Coronary Artery  No significant coronary disease.  Intervention  No interventions have been documented. Right Heart  Right Heart Pressures RHC Procedural Findings: Hemodynamics (mmHg) RA mean 2 RV 33/4 PA 32/10, mean 23 PCWP mean 12 LV 127/11 AO 128/101  Oxygen saturations: PA 70% AO 100%  Cardiac Output (Fick) 5.98  Cardiac Index (Fick) 2.69      Objective:   Weight Range: 108.8 kg Body mass index is 38.72 kg/m.   Vital Signs:   Temp:  [98 F (36.7 C)-98.7 F (37.1 C)] 98 F (36.7 C) (10/17 0616) Pulse Rate:  [49-108] 97 (10/17 0841) Resp:  [13-21] 18 (10/17 0841) BP: (123-172)/(74-137) 141/95 (10/17 0841) SpO2:  [98 %-100 %] 100 % (10/17 0616) Weight:  [108.8 kg] 108.8 kg (10/17 0616) Last BM Date: 02/23/19  Weight change: Filed Weights   02/24/19 0030 02/24/19 0528 02/25/19 0616  Weight: 106.2 kg 105.1 kg 108.8 kg    Intake/Output:   Intake/Output Summary (Last 24 hours) at 02/25/2019 1025 Last data filed at 02/25/2019 0900 Gross per 24 hour  Intake 1491.05 ml  Output 1200 ml  Net 291.05 ml      Physical Exam    General:  Well appearing. No resp difficulty HEENT: Normal Neck: Supple. JVP not elevated. Carotids 2+ bilat; no bruits. No lymphadenopathy or thyromegaly appreciated. Cor: PMI nondisplaced. Regular rate & rhythm. No rubs or murmurs. Soft S4.  Lungs: Clear Abdomen: Soft, nontender, nondistended. No hepatosplenomegaly. No bruits or masses. Good bowel sounds. Extremities: No cyanosis, clubbing, rash, edema. Right radial  cath site benign.  Neuro: Alert & orientedx3, cranial nerves grossly intact. moves all 4 extremities w/o difficulty. Affect pleasant   Telemetry   NSR 80s  Labs    CBC Recent Labs    02/23/19 1629 02/24/19 1634 02/24/19 1805  WBC 7.5  --  7.0  NEUTROABS 3.7  --   --   HGB 12.1* 15.3 14.2  HCT 38.9* 45.0 44.3  MCV 72.6*  --  72.0*  PLT 280  --  318   Basic Metabolic Panel Recent Labs    79/39/03 1629 02/24/19 0338 02/24/19 1634 02/24/19 1805 02/25/19 0502  NA 140  --  143  --  136  K 3.1*  --  3.3*  --  3.6  CL 106  --   --   --  103  CO2 23  --   --   --  22  GLUCOSE 95  --   --   --  108*  BUN 16  --   --   --  10  CREATININE 1.39*  --   --  1.43* 1.33*  CALCIUM 8.7*  --   --   --  8.9  MG  --  2.1  --   --  2.0   Liver Function Tests No results for input(s): AST, ALT, ALKPHOS, BILITOT, PROT, ALBUMIN in the last 72 hours. No results for input(s): LIPASE, AMYLASE in the last 72 hours. Cardiac Enzymes No results for input(s): CKTOTAL, CKMB, CKMBINDEX, TROPONINI in the last 72 hours.  BNP: BNP (last 3  results) Recent Labs    02/23/19 1629  BNP 882.4*    ProBNP (last 3 results) No results for input(s): PROBNP in the last 8760 hours.   D-Dimer No results for input(s): DDIMER in the last 72 hours. Hemoglobin A1C Recent Labs    02/24/19 0338  HGBA1C 6.2*   Fasting Lipid Panel Recent Labs    02/24/19 0338  CHOL 183  HDL 31*  LDLCALC 139*  TRIG 65  CHOLHDL 5.9   Thyroid Function Tests No results for input(s): TSH, T4TOTAL, T3FREE, THYROIDAB in the last 72 hours.  Invalid input(s): FREET3  Other results:   Imaging     No results found.   Medications:     Scheduled Medications: . aspirin EC  81 mg Oral Daily  . atorvastatin  40 mg Oral q1800  . carvedilol  3.125 mg Oral BID WC  . enoxaparin (LOVENOX) injection  40 mg Subcutaneous Q24H  . furosemide  40 mg Oral Daily  . potassium chloride  40 mEq Oral BID  .  sacubitril-valsartan  1 tablet Oral BID  . sodium chloride flush  3 mL Intravenous Q12H  . sodium chloride flush  3 mL Intravenous Q12H  . spironolactone  12.5 mg Oral Once  . [START ON 02/26/2019] spironolactone  25 mg Oral Daily     Infusions: . sodium chloride       PRN Medications:  sodium chloride, acetaminophen, albuterol, dextromethorphan-guaiFENesin, hydrALAZINE, hydrOXYzine, morphine injection, nitroGLYCERIN, ondansetron (ZOFRAN) IV, sodium chloride flush, sodium chloride flush    Assessment/Plan   1. Acute systolic CHF: Echo this admission with EF 25-30% with mild LV dilation and mild LV hypertrophy, mildly decreased RV systolic function.  He had pulmonary edema on CXR.  Cause of cardiomyopathy may be long-standing uncontrolled HTN. LHC/RHC showed normal filling pressures and cardiac output after diuresis, no coronary disease on angiography.  No drugs/ETOH/smoking.  Mother with "heart disease" but he is not clear on the diagnosis.  He is euvolemic on exam this morning.  - Continue Lasix 40 mg daily.  - He got amlodipine this morning.  I will stop amlodipine and instead use evidence-based regimen to treat his cardiomyopathy. - Entresto 24/26 bid today, will not increase since he got amlodipine.  Start Entresto 49/51 bid tomorrow or as outpatient if he goes home today.   - Increase spironolactone to 25 mg daily.   - Add Coreg 3.125 mg bid.  - Bidil eventually.  2. Hypertensive urgency/emergency: Patient admitted with markedly elevated BP in setting of acute CHF.  BP trending down slowly. He has a history of HTN, not on meds prior to admission.  - Would use medications with benefit in HFrEF for BP control (not amlodipine).  Have started Coreg, spironolactone, and Entresto, can eventually add on Bidil.   If he goes home today, for his cardiac regimen would use Lasix 40 mg daily, Entresto 49/51 bid starting tomorrow, spironolactone 25 daily, and Coreg 3.125 mg bid.  Will see him in  CHF clinic.   Length of Stay: 1  Loralie Champagne, MD  02/25/2019, 10:25 AM  Advanced Heart Failure Team Pager [phone] (M-F; 7a - 4p)  Please contact [facility] for night-coverage after hours (4p -7a ) and weekends on [email or web address]

## 2019-02-26 LAB — BASIC METABOLIC PANEL
Anion gap: 10 (ref 5–15)
BUN: 12 mg/dL (ref 6–20)
CO2: 24 mmol/L (ref 22–32)
Calcium: 9.1 mg/dL (ref 8.9–10.3)
Chloride: 103 mmol/L (ref 98–111)
Creatinine, Ser: 1.77 mg/dL — ABNORMAL HIGH (ref 0.61–1.24)
GFR calc Af Amer: 56 mL/min — ABNORMAL LOW (ref >60)
GFR calc non Af Amer: 49 mL/min — ABNORMAL LOW (ref >60)
Glucose, Bld: 101 mg/dL — ABNORMAL HIGH (ref 70–99)
Potassium: 3.9 mmol/L (ref 3.5–5.1)
Sodium: 137 mmol/L (ref 135–145)

## 2019-02-26 LAB — MAGNESIUM: Magnesium: 1.9 mg/dL (ref 1.7–2.4)

## 2019-02-26 MED ORDER — SACUBITRIL-VALSARTAN 49-51 MG PO TABS: 1.0000 | ORAL_TABLET | Freq: Two times a day (BID) | ORAL | 0 refills | Status: DC

## 2019-02-26 MED ORDER — FUROSEMIDE 40 MG PO TABS: 40.0000 mg | ORAL_TABLET | Freq: Every day | ORAL | 0 refills | Status: DC

## 2019-02-26 MED ORDER — SPIRONOLACTONE 25 MG PO TABS: 25.0000 mg | ORAL_TABLET | Freq: Every day | ORAL | 0 refills | Status: DC

## 2019-02-26 MED ORDER — CARVEDILOL 3.125 MG PO TABS: 3.1250 mg | ORAL_TABLET | Freq: Two times a day (BID) | ORAL | 0 refills | Status: DC

## 2019-02-26 MED ORDER — SACUBITRIL-VALSARTAN 49-51 MG PO TABS
1.0000 | ORAL_TABLET | Freq: Two times a day (BID) | ORAL | Status: DC
  Administered 2019-02-26: 1 via ORAL
  Filled 2019-02-26: qty 1

## 2019-02-26 MED ORDER — POTASSIUM CHLORIDE CRYS ER 20 MEQ PO TBCR: 40.0000 meq | EXTENDED_RELEASE_TABLET | Freq: Every day | ORAL | 0 refills | Status: DC

## 2019-02-26 NOTE — Progress Notes (Signed)
TOC CM spoke to pt and medications escribed to pharmacy. Faxed his [employer] letter to [employer]. Will arrange follow up appt with [facility] on 02/27/2019. Pt has Entresto 30 day free trial card. Provided pt with note for work. [place], [place] ED TOC CM [phone]

## 2019-02-26 NOTE — Progress Notes (Signed)
MD, pt was requesting to take a shower this am, he informed the RN that he had one yesterday, please address, thanks Beola Cord RN.

## 2019-02-26 NOTE — Progress Notes (Signed)
Patient ID: Derek Reed, male   DOB: 1985/11/04, 33 y.o.   MRN: [id number]     Advanced Heart Failure Rounding Note  PCP-Cardiologist: No primary care provider on file.   Subjective:    No complaints this morning, feeling better and BP trending down.  Pending labs.   LHC/RHC (10/16):  Coronary Findings  Diagnostic Dominance: Right Left Main  No significant coronary disease.  Left Anterior Descending  No significant coronary disease.  Left Circumflex  No significant coronary disease.  Right Coronary Artery  No significant coronary disease.  Intervention  No interventions have been documented. Right Heart  Right Heart Pressures RHC Procedural Findings: Hemodynamics (mmHg) RA mean 2 RV 33/4 PA 32/10, mean 23 PCWP mean 12 LV 127/11 AO 128/101  Oxygen saturations: PA 70% AO 100%  Cardiac Output (Fick) 5.98  Cardiac Index (Fick) 2.69      Objective:   Weight Range: 105.1 kg Body mass index is 37.41 kg/m.   Vital Signs:   Temp:  [97.5 F (36.4 C)-98.4 F (36.9 C)] 98 F (36.7 C) (10/18 0554) Pulse Rate:  [74-97] 78 (10/18 0554) Resp:  [16-20] 18 (10/18 0554) BP: (118-141)/(85-95) 135/92 (10/18 0554) SpO2:  [97 %-99 %] 99 % (10/18 0554) Weight:  [105.1 kg] 105.1 kg (10/18 0554) Last BM Date: 02/23/19  Weight change: Filed Weights   02/24/19 0528 02/25/19 0616 02/26/19 0554  Weight: 105.1 kg 108.8 kg 105.1 kg    Intake/Output:   Intake/Output Summary (Last 24 hours) at 02/26/2019 0804 Last data filed at 02/26/2019 0600 Gross per 24 hour  Intake 1040 ml  Output 1050 ml  Net -10 ml      Physical Exam    General: NAD Neck: No JVD, no thyromegaly or thyroid nodule.  Lungs: Clear to auscultation bilaterally with normal respiratory effort. CV: Nondisplaced PMI.  Heart regular S1/S2, soft S4, no murmur.  No peripheral edema.  No carotid bruit.  Normal pedal pulses.  Abdomen: Soft, nontender, no hepatosplenomegaly, no distention.  Skin: Intact  without lesions or rashes.  Neurologic: Alert and oriented x 3.  Psych: Normal affect. Extremities: No clubbing or cyanosis.  HEENT: Normal.    Telemetry   NSR 80s  Labs    CBC Recent Labs    02/23/19 1629 02/24/19 1634 02/24/19 1805  WBC 7.5  --  7.0  NEUTROABS 3.7  --   --   HGB 12.1* 15.3 14.2  HCT 38.9* 45.0 44.3  MCV 72.6*  --  72.0*  PLT 280  --  664   Basic Metabolic Panel Recent Labs    02/23/19 1629 02/24/19 0338 02/24/19 1634 02/24/19 1805 02/25/19 0502  NA 140  --  143  --  136  K 3.1*  --  3.3*  --  3.6  CL 106  --   --   --  103  CO2 23  --   --   --  22  GLUCOSE 95  --   --   --  108*  BUN 16  --   --   --  10  CREATININE 1.39*  --   --  1.43* 1.33*  CALCIUM 8.7*  --   --   --  8.9  MG  --  2.1  --   --  2.0   Liver Function Tests No results for input(s): AST, ALT, ALKPHOS, BILITOT, PROT, ALBUMIN in the last 72 hours. No results for input(s): LIPASE, AMYLASE in the last 72 hours. Cardiac Enzymes No  results for input(s): CKTOTAL, CKMB, CKMBINDEX, TROPONINI in the last 72 hours.  BNP: BNP (last 3 results) Recent Labs    02/23/19 1629  BNP 882.4*    ProBNP (last 3 results) No results for input(s): PROBNP in the last 8760 hours.   D-Dimer No results for input(s): DDIMER in the last 72 hours. Hemoglobin A1C Recent Labs    02/24/19 0338  HGBA1C 6.2*   Fasting Lipid Panel Recent Labs    02/24/19 0338  CHOL 183  HDL 31*  LDLCALC 139*  TRIG 65  CHOLHDL 5.9   Thyroid Function Tests No results for input(s): TSH, T4TOTAL, T3FREE, THYROIDAB in the last 72 hours.  Invalid input(s): FREET3  Other results:   Imaging    No results found.   Medications:     Scheduled Medications: . aspirin EC  81 mg Oral Daily  . atorvastatin  40 mg Oral q1800  . carvedilol  3.125 mg Oral BID WC  . enoxaparin (LOVENOX) injection  40 mg Subcutaneous Q24H  . furosemide  40 mg Oral Daily  . potassium chloride  40 mEq Oral BID  .  sacubitril-valsartan  1 tablet Oral BID  . sodium chloride flush  3 mL Intravenous Q12H  . sodium chloride flush  3 mL Intravenous Q12H  . spironolactone  25 mg Oral Daily    Infusions: . sodium chloride      PRN Medications: sodium chloride, acetaminophen, albuterol, dextromethorphan-guaiFENesin, nitroGLYCERIN, ondansetron (ZOFRAN) IV, sodium chloride flush, sodium chloride flush    Assessment/Plan   1. Acute systolic CHF: Echo this admission with EF 25-30% with mild LV dilation and mild LV hypertrophy, mildly decreased RV systolic function.  He had pulmonary edema on CXR.  Cause of cardiomyopathy may be long-standing uncontrolled HTN. LHC/RHC showed normal filling pressures and cardiac output after diuresis, no coronary disease on angiography.  No drugs/ETOH/smoking.  Mother with "heart disease" but he is not clear on the diagnosis.  He is euvolemic on exam this morning.  - Continue Lasix 40 mg daily.  - Increase Entresto to 49/51 bid today.   - Continue spironolactone 25 mg daily.   - Continue Coreg 3.125 mg bid.  - Bidil eventually.  2. Hypertensive urgency/emergency: Patient admitted with markedly elevated BP in setting of acute CHF.  BP trending down slowly. He has a history of HTN, not on meds prior to admission.  - Would use medications with benefit in HFrEF for BP control (not amlodipine).  Have started Coreg, spironolactone, and Entresto, can eventually add on Bidil.   Home today pending labs.  Told him to try to stay out of work until next week.  Meds for home: Lasix 40 mg daily, Entresto 49/51 bid, spironolactone 25 daily, and Coreg 3.125 mg bid.  Will see him in CHF clinic.   Length of Stay: 2  Marca Ancona, MD  02/26/2019, 8:04 AM  Advanced Heart Failure Team Pager [phone] (M-F; 7a - 4p)  Please contact [facility] for night-coverage after hours (4p -7a ) and weekends on [email or web address]

## 2019-02-26 NOTE — Progress Notes (Signed)
Discharge instructions given to patient he verbalized understanding. PIV removed with catheter intact. He has received his meds from Pharmacy before discharge per orders. CCMD has been notified of discharge. Tele removed. Patient instructed to call for his ride home.

## 2019-02-26 NOTE — TOC Transition Note (Signed)
Transition of Care [facility]) - CM/SW Discharge Note   Patient Details  Name: Derek Reed MRN: [id number] Date of Birth: January 04, 1986  [facility] CM/SW Contact:  Carles Collet, RN Phone Number: 02/26/2019, 9:09 AM   Clinical Narrative:   Patient provided with [place] letter and Delene Loll card. Unit clerk will call [facility] on Monday to schedule follow up appointment    Final next level of care: Home/Self Care Barriers to Discharge: No Barriers Identified   Patient Goals and CMS Choice        Discharge Placement                       Discharge Plan and Services                                     Social Determinants of Health (SDOH) Interventions     Readmission Risk Interventions No flowsheet data found.

## 2019-02-26 NOTE — Plan of Care (Signed)
  Problem: Clinical Measurements: Goal: Respiratory complications will improve Outcome: Completed/Met   Problem: Activity: Goal: Risk for activity intolerance will decrease Outcome: Completed/Met   Problem: Nutrition: Goal: Adequate nutrition will be maintained Outcome: Completed/Met   Problem: Coping: Goal: Level of anxiety will decrease Outcome: Completed/Met   Problem: Elimination: Goal: Will not experience complications related to bowel motility Outcome: Completed/Met Goal: Will not experience complications related to urinary retention Outcome: Completed/Met   Problem: Pain Managment: Goal: General experience of comfort will improve Outcome: Completed/Met   Problem: Safety: Goal: Ability to remain free from injury will improve Outcome: Completed/Met   Problem: Skin Integrity: Goal: Risk for impaired skin integrity will decrease Outcome: Completed/Met   Problem: Activity: Goal: Capacity to carry out activities will improve Outcome: Completed/Met

## 2019-02-27 ENCOUNTER — Telehealth (HOSPITAL_COMMUNITY): Payer: Self-pay | Admitting: Pharmacy Technician

## 2019-02-27 ENCOUNTER — Encounter (HOSPITAL_COMMUNITY): Payer: Self-pay | Admitting: Cardiology

## 2019-02-27 ENCOUNTER — Telehealth (HOSPITAL_COMMUNITY): Payer: Self-pay

## 2019-02-27 NOTE — Telephone Encounter (Signed)
Received a message about getting patient assistance to receive his Derek Reed, as he has no insurance. Called and spoke with patient, he is aware that we are going to move forward with [employer] to get his Entresto. I will take the paperwork up to the check in desk for him to sign, then will send in on his behalf.   He had questions about the other medications on his list and the discrepancy in pricing. It sounds like he has medications that are on the heart failure fund, I explained to him that in the future when he gets those medications filled, he would need to use [facility] to receive that pricing. Patient verbalized understanding.   Will update with [employer] information as it comes available.  Charlann Boxer, CPhT

## 2019-02-27 NOTE — Telephone Encounter (Signed)
Post hosp f/u appt made. Pt aware and verbalized understanding.

## 2019-02-28 ENCOUNTER — Telehealth (HOSPITAL_COMMUNITY): Payer: Self-pay | Admitting: Pharmacist

## 2019-02-28 NOTE — Telephone Encounter (Signed)
Sent in Manufacturer's Assistance application to [employer] for Entresto.   Phone:[phone] Fax: [phone]  Application pending, will continue to follow.  Abriel Geesey, PharmD, BCPS, CPP Heart Failure Clinic Pharmacist [phone]  

## 2019-03-02 NOTE — Telephone Encounter (Signed)
Advanced Heart Failure Patient Advocate Encounter   Patient was approved to receive Entresto from [employer]  Patient ID: [id number] Effective dates: 03/01/2019 through 02/29/2020  Called patient to inform him of approval, had to leave message.  Will follow up.  Charlann Boxer, CPhT

## 2019-03-07 ENCOUNTER — Other Ambulatory Visit: Payer: Self-pay

## 2019-03-07 ENCOUNTER — Ambulatory Visit (HOSPITAL_COMMUNITY)
Admission: RE | Admit: 2019-03-07 | Discharge: 2019-03-07 | Disposition: A | Discharge status: Home / Self Care | Payer: Self-pay | Source: Ambulatory Visit | Attending: Cardiology | Admitting: Cardiology

## 2019-03-07 VITALS — BP 122/80 | HR 64 | Wt 233.6 lb

## 2019-03-07 DIAGNOSIS — Z79899 Other long term (current) drug therapy: Secondary | ICD-10-CM | POA: Insufficient documentation

## 2019-03-07 DIAGNOSIS — Z87891 Personal history of nicotine dependence: Secondary | ICD-10-CM | POA: Insufficient documentation

## 2019-03-07 DIAGNOSIS — I110 Hypertensive heart disease with heart failure: Secondary | ICD-10-CM | POA: Insufficient documentation

## 2019-03-07 DIAGNOSIS — Z8249 Family history of ischemic heart disease and other diseases of the circulatory system: Secondary | ICD-10-CM | POA: Insufficient documentation

## 2019-03-07 DIAGNOSIS — I428 Other cardiomyopathies: Secondary | ICD-10-CM | POA: Insufficient documentation

## 2019-03-07 DIAGNOSIS — I5022 Chronic systolic (congestive) heart failure: Secondary | ICD-10-CM | POA: Insufficient documentation

## 2019-03-07 LAB — BASIC METABOLIC PANEL
Anion gap: 10 (ref 5–15)
BUN: 14 mg/dL (ref 6–20)
CO2: 21 mmol/L — ABNORMAL LOW (ref 22–32)
Calcium: 9.3 mg/dL (ref 8.9–10.3)
Chloride: 106 mmol/L (ref 98–111)
Creatinine, Ser: 1.67 mg/dL — ABNORMAL HIGH (ref 0.61–1.24)
GFR calc Af Amer: >60 mL/min (ref >60)
GFR calc non Af Amer: 52 mL/min — ABNORMAL LOW (ref >60)
Glucose, Bld: 94 mg/dL (ref 70–99)
Potassium: 4.6 mmol/L (ref 3.5–5.1)
Sodium: 137 mmol/L (ref 135–145)

## 2019-03-07 MED ORDER — ENTRESTO 97-103 MG PO TABS: 1.0000 | ORAL_TABLET | Freq: Two times a day (BID) | ORAL | 3 refills | Status: DC

## 2019-03-07 MED ORDER — FUROSEMIDE 40 MG PO TABS: 20.0000 mg | ORAL_TABLET | Freq: Every day | ORAL | 3 refills | Status: DC

## 2019-03-07 NOTE — Addendum Note (Signed)
Encounter addended by: Consuelo Pandy, PA-C on: 03/07/2019 5:37 PM  Actions taken: Clinical Note Signed

## 2019-03-07 NOTE — Progress Notes (Addendum)
Advanced Heart Failure Clinic Note   Referring Physician: PCP: Patient, No Pcp Per PCP-Cardiologist: Dr. Shirlee Latch  Reason for Visit: [facility] f/u for Systolic HF 2/2 NICM/ Hypertensive Heart Disease   HPI:  32 y/o [name] with PMH of untreated HTN who was admitted to [facility] 02/24/19 w/ acute CHF. He presented with acute dyspnea and was found to be markedly hypertensive and in acute pulmonary edema. He was started on IV diuretics and antihypertensives. 2D echo showed severely reduced LVEF 25-30% with mild LV dilation and mild LV hypertrophy, mildly decreased RV systolic function. LHC/RHC showed normal filling pressures and cardiac output after diuresis, no coronary disease on angiography. No h/o drugs/ETOH/smoking. Mother with "heart disease" but he is not clear on the diagnosis.   Primary etiology for his HF was felt to be poorly controlled HTN. After he was diuresed back to euvolemic state, he was transitioned to PO lasix. Also treated w/ Entresto, Spironolactone and Coreg. Discharge weight was 231 lb.   He presents to clinic today for post hospital f/u. Doing well. Denies exertional dyspnea. No weight gain or LEE. Compliant w/ meds. Tolerating well. No side effects. Has modified diet. Trying to eat healthier and reading food labels. Trying to keep Na intake under 1500 mg/day. Office weight 233 lb today. BP 122/80.   LHC/RHC (10/16):  Coronary Findings  Diagnostic Dominance: Right Left Main  No significant coronary disease.  Left Anterior Descending  No significant coronary disease.  Left Circumflex  No significant coronary disease.  Right Coronary Artery  No significant coronary disease.  Intervention  No interventions have been documented. Right Heart  Right Heart Pressures RHC Procedural Findings: Hemodynamics (mmHg) RA mean 2 RV 33/4 PA 32/10, mean 23 PCWP mean 12 LV 127/11 AO 128/101  Oxygen saturations: PA 70% AO 100%  Cardiac Output (Fick) 5.98  Cardiac  Index (Fick) 2.69   2D Echo 02/2019  LVEF 25-30% with mild LV dilation and mild LV hypertrophy, mildly decreased RV systolic function.     Review of Systems: [y] = yes, [ ]  = no   General: Weight gain [ ] ; Weight loss [ ] ; Anorexia [ ] ; Fatigue [ ] ; Fever [ ] ; Chills [ ] ; Weakness [ ]   Cardiac: Chest pain/pressure [ ] ; Resting SOB [ ] ; Exertional SOB [ ] ; Orthopnea [ ] ; Pedal Edema [ ] ; Palpitations [ ] ; Syncope [ ] ; Presyncope [ ] ; Paroxysmal nocturnal dyspnea[ ]   Pulmonary: Cough [ ] ; Wheezing[ ] ; Hemoptysis[ ] ; Sputum [ ] ; Snoring [ ]   GI: Vomiting[ ] ; Dysphagia[ ] ; Melena[ ] ; Hematochezia [ ] ; Heartburn[ ] ; Abdominal pain [ ] ; Constipation [ ] ; Diarrhea [ ] ; BRBPR [ ]   GU: Hematuria[ ] ; Dysuria [ ] ; Nocturia[ ]   Vascular: Pain in legs with walking [ ] ; Pain in feet with lying flat [ ] ; Non-healing sores [ ] ; Stroke [ ] ; TIA [ ] ; Slurred speech [ ] ;  Neuro: Headaches[ ] ; Vertigo[ ] ; Seizures[ ] ; Paresthesias[ ] ;Blurred vision [ ] ; Diplopia [ ] ; Vision changes [ ]   Ortho/Skin: Arthritis [ ] ; Joint pain [ ] ; Muscle pain [ ] ; Joint swelling [ ] ; Back Pain [ ] ; Rash [ ]   Psych: Depression[ ] ; Anxiety[ ]   Heme: Bleeding problems [ ] ; Clotting disorders [ ] ; Anemia [ ]   Endocrine: Diabetes [ ] ; Thyroid dysfunction[ ]    Past Medical History:  Diagnosis Date  . Anxiety   . Peritonsillar abscess     Current Outpatient Medications  Medication Sig Dispense Refill  . carvedilol (COREG) 3.125  MG tablet Take 1 tablet (3.125 mg total) by mouth 2 (two) times daily with a meal. 60 tablet 0  . furosemide (LASIX) 40 MG tablet Take 0.5 tablets (20 mg total) by mouth daily. 30 tablet 3  . potassium chloride SA (KLOR-CON) 20 MEQ tablet Take 2 tablets (40 mEq total) by mouth daily. 30 tablet 0  . spironolactone (ALDACTONE) 25 MG tablet Take 1 tablet (25 mg total) by mouth daily. 30 tablet 0  . sacubitril-valsartan (ENTRESTO) 97-103 MG Take 1 tablet by mouth 2 (two) times daily. 60 tablet 3   No  current facility-administered medications for this encounter.     No Known Allergies    Social History   Socioeconomic History  . Marital status: Single    Spouse name: Not on file  . Number of children: Not on file  . Years of education: Not on file  . Highest education level: Not on file  Occupational History  . Not on file  Social Needs  . Financial resource strain: Not on file  . Food insecurity    Worry: Not on file    Inability: Not on file  . Transportation needs    Medical: Not on file    Non-medical: Not on file  Tobacco Use  . Smoking status: Former Games developer  . Smokeless tobacco: Never Used  . Tobacco comment: quit in January  Substance and Sexual Activity  . Alcohol use: No  . Drug use: No  . Sexual activity: Not on file  Lifestyle  . Physical activity    Days per week: Not on file    Minutes per session: Not on file  . Stress: Not on file  Relationships  . Social Musician on phone: Not on file    Gets together: Not on file    Attends religious service: Not on file    Active member of club or organization: Not on file    Attends meetings of clubs or organizations: Not on file    Relationship status: Not on file  . Intimate partner violence    Fear of current or ex partner: Not on file    Emotionally abused: Not on file    Physically abused: Not on file    Forced sexual activity: Not on file  Other Topics Concern  . Not on file  Social History Narrative  . Not on file      Family History  Problem Relation Age of Onset  . Hyperlipidemia Mother   . Diabetes Mother   . Stroke Mother   . Hypertension Mother   . Hypertension Father     Vitals:   03/07/19 1422  BP: 122/80  Pulse: 64  SpO2: 100%  Weight: 106 kg (233 lb 9.6 oz)     PHYSICAL EXAM: General:  Well appearing. No respiratory difficulty HEENT: normal Neck: supple. no JVD. Carotids 2+ bilat; no bruits. No lymphadenopathy or thyromegaly appreciated. Cor: PMI  nondisplaced. Regular rate & rhythm. No rubs, gallops or murmurs. Lungs: clear Abdomen: soft, nontender, nondistended. No hepatosplenomegaly. No bruits or masses. Good bowel sounds. Extremities: no cyanosis, clubbing, rash, edema Neuro: alert & oriented x 3, cranial nerves grossly intact. moves all 4 extremities w/o difficulty. Affect pleasant.  ECG: N/a   ASSESSMENT & PLAN:  1. Systolic Heart Failure: Recent  Echo 10/20 w/ reduced LVEF 25-30% with mild LV dilation and mild LV hypertrophy, mildly decreased RV systolic function.  Cause of cardiomyopathy may be long-standing uncontrolled HTN.  LHC/RHC showed normal filling pressures and cardiac output after diuresis, no coronary disease on angiography. No drugs/ETOH/smoking. Mother with "heart disease" but he is not clear on the diagnosis.  -NYHA Class II, euvolemic on exam. Wt stable compared to recent hospital d/c wt -BP improved. 122/80 today.  -Check BMP today - Continue Entresto to 49-51 bid - Reduce Lasix to 20 mg daily  - Continue spironolactone 25 mg daily.   - Continue Coreg 3.125 mg bid.  - Add Bidil 1 tablet tid  - we discussed importance of daily wts and low sodium diet  - f/u w/ PharmD in 3 weeks for further med titration. -will ultimately need repeat echo 3 months after med optimization  2. HTN: overall improved. 122/80 today. -HF meds per above.   F/u w/ PharmD in 3 weeks for further titration of HF regimen. F/u with Dr. Aundra Dubin in 6 weeks.    Lyda Jester, PA-C 03/07/19

## 2019-03-07 NOTE — Patient Instructions (Signed)
INCREASE Entresto to 97/103 mg, one tab twice DECREASE Lasix to 20 mg, one half tab daily  Labs today We will only contact you if something comes back abnormal or we need to make some changes. Otherwise no news is good news!  Your physician recommends that you schedule a follow-up appointment in: 3 week with the HF Pharmacy Team  Your physician recommends that you schedule a follow-up appointment in: 6 weeks with Dr Aundra Dubin  Do the following things EVERYDAY: 1) Weigh yourself in the morning before breakfast. Write it down and keep it in a log. 2) Take your medicines as prescribed 3) Eat low salt foods-Limit salt (sodium) to 2000 mg per day.  4) Stay as active as you can everyday 5) Limit all fluids for the day to less than 2 liters   At the [facility], you and your health needs are our priority. As part of our continuing mission to provide you with exceptional heart care, we have created designated Provider Care Teams. These Care Teams include your primary Cardiologist (physician) and Advanced Practice Providers (APPs- Physician Assistants and Nurse Practitioners) who all work together to provide you with the care you need, when you need it.   You may see any of the following providers on your designated Care Team at your next follow up: Marland Kitchen Dr Glori Bickers . Dr Loralie Champagne . Darrick Grinder, NP . Lyda Jester, PA   Please be sure to bring in all your medications bottles to every appointment.

## 2019-03-09 ENCOUNTER — Telehealth (HOSPITAL_COMMUNITY): Payer: Self-pay | Admitting: Cardiology

## 2019-03-09 MED ORDER — ENTRESTO 49-51 MG PO TABS: 1.0000 | ORAL_TABLET | Freq: Two times a day (BID) | ORAL | 6 refills | Status: DC

## 2019-03-09 MED ORDER — BIDIL 20-37.5 MG PO TABS: 1.0000 | ORAL_TABLET | Freq: Three times a day (TID) | ORAL | 3 refills | Status: DC

## 2019-03-09 NOTE — Telephone Encounter (Signed)
-----   Message from Consuelo Pandy, [place] sent at 03/07/2019  5:19 PM EDT ----- Needs repeat BMP in 1 week to ensure renal function ok after Entresto increase

## 2019-03-09 NOTE — Telephone Encounter (Signed)
Notes recorded by Kerry Dory, CMA on 03/09/2019 at 10:27 AM EDT  Per Lyda Jester, PA   Pt's SCr is up. I called him. He has not yet picked up 97-103 of entresto. We will continue 49-51 dose instead. Please add bidil 1 tablet tid. Thanks. He will not need f/u BMP in 7 days (I sent result note earlier). Keep plans to f/u with pharmD   Pt aware to NOT increase entresto, labs are NOT needed in one week. Pt aware he will start bidil. Medications updated   ------   Notes recorded by Consuelo Pandy, PA-C on 03/07/2019 at 5:19 PM EDT  Needs repeat BMP in 1 week to ensure renal function ok after Entresto increase

## 2019-03-10 NOTE — Discharge Summary (Addendum)
Physician Discharge Summary  Derek Reed NLG:[id number] DOB: 1985-08-13 DOA: 02/23/2019  PCP: Patient, No Pcp Per  Admit date: 02/23/2019 Discharge date: 02/26/2019  Time spent: 22mnutes  Recommendations for Outpatient Follow-up:  [facility] please check be met at follow-up   Discharge Diagnoses:  Principal Problem: Acute systolic CHF Hypertensive cardiomyopathy Borderline diabetes   Hypokalemia   HTN (hypertension)   Elevated troponin   CKD (chronic kidney disease), stage II   Anxiety   Acute systolic CHF (congestive heart failure) ([place]   Discharge Condition: Stable  Diet recommendation: Carb modified, heart healthy  Filed Weights   02/24/19 0528 02/25/19 0616 02/26/19 0554  Weight: 105.1 kg 108.8 kg 105.1 kg    History of present illness:  Derek Hooperis a 33y.o.malewith medical history significant ofhypertension not taking medications, CKD-2, peritonsillar abscess, anxiety, who presented with shortness of breath, for more than 2 weeks. He has dry cough. - COVID-19 test was negative, EKG noted LVH Chest x-ray showed increased cardiac silhouette and interstitial edema   Hospital Course:   Acute CHF/new onset -Hypertensive cardiomyopathy, has LVH on EKG -Clinically improved with diuresis, he was -5 L at discharge -2D echo with EF of 25 to 30%, cardiology consult underwent left heart cath 10/16 which showed minimal nonobstructive disease, consistent with nonischemic cardiomyopathy -Transition to oral Lasix, Entresto, Coreg and Aldactone at discharge -Seen by case management for patient assistance, he was given a mSystems developerand EDelene Lollcard -Educated regarding diet and lifestyle modification  Elevated troponin: -Troponin156 -->153. 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   Discharge Exam: Vitals:   02/25/19 2052 02/26/19 0554  BP: 123/85 (!) 135/92  Pulse: 74 78  Resp: 16 18  Temp: (!) 97.5 F (36.4 C) 98 F (36.7 C)  SpO2: 97% 99%    General: AOx3 Cardiovascular: S1-S2/regular rate rhythm Respiratory: Clear bilaterally  Discharge Instructions   Discharge Instructions    Diet - low sodium heart healthy   Complete by: As directed    Diet Carb Modified   Complete by: As directed    Increase activity slowly   Complete by: As directed      Allergies as of 02/26/2019   No Known Allergies     Medication List    STOP taking these medications   benzonatate 100 MG capsule Commonly known as: TESSALON     TAKE these medications   carvedilol 3.125 MG tablet Commonly known as: COREG Take 1 tablet (3.125 mg total) by mouth 2 (two) times daily with a meal.   potassium chloride SA 20 MEQ tablet Commonly known as: KLOR-CON Take 2 tablets (40 mEq total) by mouth daily. What changed: how much to take   spironolactone 25 MG tablet Commonly known as: ALDACTONE Take 1 tablet (25 mg total) by mouth daily.      No Known Allergies Follow-up Information    [facility].   Contact information: [address] [place] [phone]  [facility].   Specialty: Cardiology Contact information: [address] [id number] [place] [phone]            The results of significant diagnostics from this hospitalization (including imaging, microbiology, ancillary and laboratory) are listed below for reference.    Significant Diagnostic Studies: Dg Chest Port 1 View  Result Date: 02/23/2019 CLINICAL DATA:  Shortness of breath. EXAM: PORTABLE CHEST 1 VIEW COMPARISON:  02/15/2019 FINDINGS: Enlarged cardiac silhouette.  Mediastinal contours appear intact. Interstitial pulmonary edema. Osseous structures are without acute abnormality. Soft tissues are grossly normal. IMPRESSION: Enlarged cardiac silhouette, which may be due to cardiomyopathy or pericardial effusion. Interstitial pulmonary edema. Electronically Signed   By: Fidela Salisbury M.D.   On: 02/23/2019 16:37   Dg Chest Portable 1 View  Result Date: 02/15/2019 CLINICAL DATA:  Cough, shortness of breath for 3 days EXAM: PORTABLE CHEST 1 VIEW COMPARISON:  05/28/2014 FINDINGS: Bilateral perihilar interstitial thickening with prominence of the central pulmonary vasculature. No acute osseous abnormality. No aggressive osseous lesion. IMPRESSION: Bilateral perihilar interstitial thickening and prominence of the central pulmonary vasculature most concerning for interstitial edema versus interstitial infection. Electronically Signed   By: Kathreen Devoid   On: 02/15/2019 15:48    Microbiology: No results found for this or any previous visit (from the past 240 hour(s)).   Labs: Basic Metabolic Panel: Recent Labs  Lab 03/07/19 1441  NA 137  K 4.6  CL 106  CO2 21*  GLUCOSE 94  BUN 14  CREATININE 1.67*  CALCIUM 9.3   Liver Function Tests: No results for input(s): AST, ALT, ALKPHOS, BILITOT, PROT, ALBUMIN in the last 168 hours. No results for input(s): LIPASE, AMYLASE in the last 168 hours. No results for input(s): AMMONIA in the last 168 hours. CBC: No results for input(s): WBC, NEUTROABS, HGB, HCT, MCV, PLT in the last 168 hours. Cardiac Enzymes: No results for input(s):  CKTOTAL, CKMB, CKMBINDEX, TROPONINI in the last 168 hours. BNP: BNP (last 3 results) Recent Labs    02/23/19 1629  BNP 882.4*    ProBNP (last 3 results) No results for input(s): PROBNP in the last 8760 hours.  CBG: No results for input(s): GLUCAP in the last 168 hours.     Signed:  Domenic Polite MD.  [facility] 03/10/2019, 3:01 PM

## 2019-03-13 NOTE — Telephone Encounter (Signed)
Spoke with patient, he did receive a 3 month supply of Entresto through [employer]. He knows to call the 800 number for refills.   Advised him to call me with any issues.  Charlann Boxer, CPhT

## 2019-03-14 ENCOUNTER — Encounter: Payer: Self-pay | Admitting: Critical Care Medicine

## 2019-03-14 ENCOUNTER — Other Ambulatory Visit: Payer: Self-pay

## 2019-03-14 ENCOUNTER — Ambulatory Visit: Payer: Self-pay | Attending: Critical Care Medicine | Admitting: Critical Care Medicine

## 2019-03-14 DIAGNOSIS — I5022 Chronic systolic (congestive) heart failure: Secondary | ICD-10-CM

## 2019-03-14 DIAGNOSIS — I10 Essential (primary) hypertension: Secondary | ICD-10-CM

## 2019-03-14 DIAGNOSIS — N182 Chronic kidney disease, stage 2 (mild): Secondary | ICD-10-CM

## 2019-03-14 MED ORDER — POTASSIUM CHLORIDE CRYS ER 20 MEQ PO TBCR: 20.0000 meq | EXTENDED_RELEASE_TABLET | Freq: Every day | ORAL | 1 refills | Status: DC

## 2019-03-14 MED ORDER — BIDIL 20-37.5 MG PO TABS: 1.0000 | ORAL_TABLET | Freq: Three times a day (TID) | ORAL | 3 refills | Status: DC

## 2019-03-14 MED ORDER — FUROSEMIDE 40 MG PO TABS: 20.0000 mg | ORAL_TABLET | Freq: Every day | ORAL | 3 refills | Status: DC

## 2019-03-14 MED ORDER — CARVEDILOL 3.125 MG PO TABS: 3.1250 mg | ORAL_TABLET | Freq: Two times a day (BID) | ORAL | 3 refills | Status: DC

## 2019-03-14 MED ORDER — SPIRONOLACTONE 25 MG PO TABS: 25.0000 mg | ORAL_TABLET | Freq: Every day | ORAL | 3 refills | Status: DC

## 2019-03-14 MED FILL — SPIRONOLACTONE 25 MG TABLET: 25 | 30 days supply | Qty: 30 | Fill #0

## 2019-03-14 MED FILL — FUROSEMIDE 40 MG TAB: 40 | 30 days supply | Qty: 30 | Fill #0

## 2019-03-14 MED FILL — CARVEDILOL 3.125 MG TABLET: 3.125 | 30 days supply | Qty: 60 | Fill #0

## 2019-03-14 MED FILL — POTASSIUM CL ER 20 MEQ TAB: 20 | 30 days supply | Qty: 30 | Fill #0

## 2019-03-14 NOTE — Progress Notes (Signed)
Patient ID: Derek Reed, male   DOB: [birth year], 33 y.o.   MRN: [id number] Virtual Visit via Telephone Note  I connected with Derek Reed on 03/14/19 at  3:20 PM EST by telephone and verified that I am speaking with the correct person using two identifiers.   Consent:  I discussed the limitations, risks, security and privacy concerns of performing an evaluation and management service by telephone and the availability of in person appointments. I also discussed with the patient that there may be a patient responsible charge related to this service. The patient expressed understanding and agreed to proceed.  Location of patient: The patient was at home  Location of provider: I was in my office  Persons participating in the televisit with the patient.   No one else with the patient    History of Present Illness: 32 y.o.M with telemedicine phone visit for CHF f/u and to establish for Primary care . This patient was admitted in October for acute pulmonary edema and hypertensive cardiomyopathy discharge summary is as below  Admit date: 02/23/2019 Discharge date: 02/26/2019  Time spent: 56mnutes  Recommendations for Outpatient Follow-up:  [facility] please check be met at follow-up   Discharge Diagnoses:  Principal Problem: Acute systolic CHF Hypertensive cardiomyopathy Borderline diabetes   Hypokalemia   HTN (hypertension)   Elevated troponin   CKD (chronic kidney disease), stage II   Anxiety   Acute systolic CHF (congestive heart failure) ([place]   Discharge Condition: Stable  Diet recommendation: Carb modified, heart healthy       Filed Weights   02/24/19 0528 02/25/19 0616 02/26/19 0554  Weight: 105.1 kg 108.8 kg 105.1 kg    History of present illness:  Derek Hooperis a 33y.o.malewith medical history significant ofhypertension not taking medications, CKD-2, peritonsillar abscess, anxiety, who presented with shortness of  breath, for more than 2 weeks. He has dry cough. - COVID-19 test was negative, EKG noted LVH Chest x-ray showed increased cardiac silhouette and interstitial edema   Hospital Course:   Acute CHF/new onset -Hypertensive cardiomyopathy, has LVH on EKG -Clinically improved with diuresis, he was -5 L at discharge -2D echo with EF of 25 to 30%, cardiology consult underwent left heart cath 10/16 which showed minimal nonobstructive disease, consistent with nonischemic cardiomyopathy -Transition to oral Lasix, Entresto, Coreg and Aldactone at discharge -Seen by case management for patient assistance, he was given a mSystems developerand EDelene Lollcard -Educated regarding diet and lifestyle modification  Elevated troponin: -Troponin156 -->153. 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  Since discharge the patient has been in the advanced heart failure clinic for follow-up as below OP CV f/u 10/27: 33y/o [name] with PMH of untreated HTN [facility]. He presented with acute dyspnea and was found to be markedly hypertensive and in acute pulmonary edema. He was started on IV diuretics and antihypertensives. 2D echo showed severely reduced LVEF 25-30% with mild LV dilation and mild LV hypertrophy, mildly decreased RV systolic function. LHC/RHC  showed normal filling pressures  and cardiac output after diuresis, no coronary disease on angiography. No h/o drugs/ETOH/smoking. Mother with "heart disease" but he is not clear on the diagnosis.   Primary etiology for his HF was felt to be poorly controlled HTN. After he was diuresed back to euvolemic state, he was transitioned to PO lasix. Also treated w/ Entresto, Spironolactone and Coreg. Discharge weight was 231 lb.   He presents to clinic today for post hospital f/u. Doing well. Denies exertional dyspnea. No weight gain or LEE. Compliant w/ meds. Tolerating well. No side effects. Has modified diet. Trying to eat healthier and reading food labels. Trying to keep Na intake under 1500 mg/day. Office weight 233 lb today. BP 122/80.   1. Systolic Heart Failure: Recent  Echo 10/20 w/ reduced LVEF 25-30% with mild LV dilation and mild LV hypertrophy, mildly decreased RV systolic function.  Cause of cardiomyopathy may be long-standing uncontrolled HTN. LHC/RHC showed normal filling pressures and cardiac output after diuresis, no coronary disease on angiography. No drugs/ETOH/smoking. Mother with "heart disease" but he is not clear on the diagnosis.  -NYHA Class II, euvolemic on exam. Wt stable compared to recent hospital d/c wt -BP improved. 122/80 today.  -Check BMP today -ContinueEntresto to49-51 bid - Reduce Lasix to 20 mg daily  - Continue spironolactone25 mg daily.  - ContinueCoreg 3.125 mg bid.  - Add Bidil 1 tablet tid  - we discussed importance of daily wts and low sodium diet  - f/u w/ PharmD in 3 weeks for further med titration. -will ultimately need repeat echo 3 months after med optimization  2. HTN: overall improved. 122/80 today. -HF meds per above.   At the last cardiology visit the patient was to continue Entresto twice daily current dose but to reduce Lasix to 20 mg daily and begin BiDil 1 tablet 3 times daily along with continuing Coreg and spironolactone.  The patient states he did not obtain  the BiDil as he cannot afford it at his private pharmacy as he is self-pay status.  The patient states his breathing is better he is having no cough or chest pain he has no edema in the lower extremities.  He is taking all his medications as prescribed.  He had questions on what to do with the potassium dose as he is about out.  Note his potassium level is 4.6 at the last cardiology visit and he is on Aldactone.   Pos in [place]  Constitutional:   No  weight loss, night sweats,  Fevers, chills, fatigue, lassitude. HEENT:   No headaches,  Difficulty swallowing,  Tooth/dental problems,  Sore throat,                No sneezing, itching, ear ache, nasal congestion, post nasal drip,   CV:  No chest pain,  Orthopnea, PND, swelling in lower extremities, anasarca, dizziness, palpitations  GI  No heartburn, indigestion, abdominal pain, nausea, vomiting, diarrhea, change in bowel habits, loss of appetite  Resp: No shortness of breath with exertion or at rest.  No excess mucus, no productive cough,  No non-productive cough,  No coughing up of blood.  No change in color of mucus.  No wheezing.  No chest wall deformity  Skin: no rash or lesions.  GU: no dysuria, change in color of urine, no urgency or frequency.  No flank pain.  MS:  No joint pain or swelling.  No decreased range of motion.  No back pain.  Psych:  No  change in mood or affect. No depression or anxiety.  No memory loss.  Observations/Objective: This was a phone visit so there are no observations however findings from the hospitalization are as below LHC/RHC (10/16):  Coronary Findings  Diagnostic Dominance: Right Left Main  No significant coronary disease.  Left Anterior Descending  No significant coronary disease.  Left Circumflex  No significant coronary disease.  Right Coronary Artery  No significant coronary disease.  Intervention  No interventions have been documented. Right Heart  Right Heart Pressures RHC  Procedural Findings: Hemodynamics (mmHg) RA mean 2 RV 33/4 PA 32/10, mean 23 PCWP mean 12 LV 127/11 AO 128/101  Oxygen saturations: PA 70% AO 100%  Cardiac Output (Fick) 5.98  Cardiac Index (Fick) 2.69   2D Echo 02/2019  LVEF 25-30% with mild LV dilation and mild LV hypertrophy, mildly decreased RV systolic function.    Assessment and Plan: #1 hypertensive cardiomyopathy with history of acute systolic heart failure now stabilized  Follow-up per cardiology and for now we will continue Aldactone, Entresto, furosemide at 20 mg a day, Coreg twice daily.  Will send all prescriptions to our pharmacy and send the BiDil to our pharmacy as the patient can access this medication 1 3 times daily.  Note the Delene Loll is being obtained by cardiology through patient assistance program and he has a 30-monthsupply already at home  Note we will also reduce the potassium dose to 20 mEq daily since his potassium was 4.6 at the last cardiology visit   Follow Up Instructions: We will obtain for this patient and in office exam face-to-face to establish in the clinic in 3 weeks   I discussed the assessment and treatment plan with the patient. The patient was provided an opportunity to ask questions and all were answered. The patient agreed with the plan and demonstrated an understanding of the instructions.   The patient was advised to call back or seek an in-person evaluation if the symptoms worsen or if the condition fails to improve as anticipated.  I provided 30 minutes of non-face-to-face time during this encounter  including  median intraservice time , review of notes, labs, imaging, medications  and explaining diagnosis and management to the patient .    PAsencion Noble MD

## 2019-03-14 NOTE — Progress Notes (Signed)
Per pt was he suppose to restart his K+ med and needs med refills   Med refills   Need referral to Cardiologist

## 2019-03-15 ENCOUNTER — Inpatient Hospital Stay: Payer: Self-pay | Admitting: Critical Care Medicine

## 2019-03-16 ENCOUNTER — Other Ambulatory Visit (HOSPITAL_COMMUNITY): Payer: Self-pay | Admitting: *Deleted

## 2019-03-27 ENCOUNTER — Ambulatory Visit (HOSPITAL_COMMUNITY)
Admission: RE | Admit: 2019-03-27 | Discharge: 2019-03-27 | Disposition: A | Discharge status: Home / Self Care | Payer: Self-pay | Source: Ambulatory Visit | Attending: Internal Medicine | Admitting: Internal Medicine

## 2019-03-27 ENCOUNTER — Other Ambulatory Visit: Payer: Self-pay

## 2019-03-27 VITALS — BP 165/98 | HR 62 | Wt 234.2 lb

## 2019-03-27 DIAGNOSIS — I5022 Chronic systolic (congestive) heart failure: Secondary | ICD-10-CM

## 2019-03-27 DIAGNOSIS — I110 Hypertensive heart disease with heart failure: Secondary | ICD-10-CM | POA: Insufficient documentation

## 2019-03-27 DIAGNOSIS — I5020 Unspecified systolic (congestive) heart failure: Secondary | ICD-10-CM | POA: Insufficient documentation

## 2019-03-27 LAB — BASIC METABOLIC PANEL
Anion gap: 9 (ref 5–15)
BUN: 13 mg/dL (ref 6–20)
CO2: 23 mmol/L (ref 22–32)
Calcium: 9 mg/dL (ref 8.9–10.3)
Chloride: 105 mmol/L (ref 98–111)
Creatinine, Ser: 1.67 mg/dL — ABNORMAL HIGH (ref 0.61–1.24)
GFR calc Af Amer: >60 mL/min (ref >60)
GFR calc non Af Amer: 52 mL/min — ABNORMAL LOW (ref >60)
Glucose, Bld: 94 mg/dL (ref 70–99)
Potassium: 4.4 mmol/L (ref 3.5–5.1)
Sodium: 137 mmol/L (ref 135–145)

## 2019-03-27 MED ORDER — BIDIL 20-37.5 MG PO TABS: 2.0000 | ORAL_TABLET | Freq: Three times a day (TID) | ORAL | 6 refills | Status: DC

## 2019-03-27 MED ORDER — FUROSEMIDE 40 MG PO TABS: 40.0000 mg | ORAL_TABLET | Freq: Every day | ORAL | 1 refills | Status: DC

## 2019-03-27 MED FILL — BIDIL 20-37.5 MG TABS: 20-37.5 | 30 days supply | Qty: 180 | Fill #0

## 2019-03-27 NOTE — Patient Instructions (Signed)
It was a pleasure seeing you today!  MEDICATIONS: -We are changing your medications today -Increase Bidil to 2 tablet by mouth three times daily -Call if you have questions about your medications.  LABS: -We will call you if your labs need attention.  NEXT APPOINTMENT: Return to clinic in 4 weeks with Pharmacy Clinic.  In general, to take care of your heart failure: -Limit your fluid intake to 2 Liters (half-gallon) per day.   -Limit your salt intake to ideally 2-3 grams (2000-3000 mg) per day. -Weigh yourself daily and record, and bring that "weight diary" to your next appointment.  (Weight gain of 2-3 pounds in 1 day typically means fluid weight.) -The medications for your heart are to help your heart and help you live longer.   -Please contact us before stopping any of your heart medications.  Call the clinic at [phone] with questions or to reschedule future appointments.

## 2019-03-28 ENCOUNTER — Telehealth (HOSPITAL_COMMUNITY): Payer: Self-pay | Admitting: Pharmacist

## 2019-03-28 ENCOUNTER — Other Ambulatory Visit (HOSPITAL_COMMUNITY): Payer: Self-pay | Admitting: *Deleted

## 2019-03-28 ENCOUNTER — Other Ambulatory Visit (HOSPITAL_COMMUNITY): Payer: Self-pay

## 2019-03-28 MED ORDER — BIDIL 20-37.5 MG PO TABS: 2.0000 | ORAL_TABLET | Freq: Three times a day (TID) | ORAL | 3 refills | Status: DC

## 2019-03-28 MED FILL — FUROSEMIDE 40 MG TAB: 40 | 30 days supply | Qty: 30 | Fill #0

## 2019-03-28 NOTE — Telephone Encounter (Signed)
Sent in [employer] Assistance application to [facility] for Bidil.   Phone: [phone] Fax: [phone]  Application pending, will continue to follow.  Audry Riles, PharmD, BCPS, CPP Heart Failure Clinic Pharmacist [phone]'

## 2019-03-29 NOTE — Progress Notes (Signed)
PCP: Patient, No Pcp Per PCP-Cardiologist: Dr. Shirlee Latch  HPI:  33 y/o [name] with PMH of untreated HTN who was admitted to [facility] 02/24/19 w/ acute CHF. He presented with acute dyspnea and was found to be markedly hypertensive and in acute pulmonary edema. He was started on IV diuretics and antihypertensives. 2D echo showed severely reduced LVEF 25-30% with mild LV dilation and mild LV hypertrophy, mildly decreased RV systolic function. LHC/RHC showed normal filling pressures and cardiac output after diuresis, no coronary disease on angiography. No h/o drugs/ETOH/smoking. Mother with "heart disease" but he is not clear on the diagnosis.   Primary etiology for his HF was felt to be poorly controlled HTN. After he was diuresed back to euvolemic state, he was transitioned to PO lasix. Also treated w/ Entresto, Spironolactone and Coreg. Discharge weight was 231 lb.   He recently presented to clinic for follow up on 03/07/2019. At that visit he reported doing well. Denied exertional dyspnea. No weight gain or LEE. Compliant w/ medications and tolerating them well. Has modified diet. Trying to eat healthier and reading food labels. Trying to keep Na intake under 1500 mg/day. Office weight 233 lb today. BP 122/80.   Today he returns to HF clinic for pharmacist medication titration. At last visit with MD, Bidil 1 tablet TID was added and furosemide was decreased to 20 mg daily due to an increase in Scr. However, he did not change his furosemide and continued to take 40 mg daily because he the phone calls confused him.  Overall since last visit he has felt well. No dizziness, lightheadedness, chest pain or palpitations. No complaints of SOB/DOE but has not been very active lately. He plans to join a gym this week. His weight has been stable at home, 230-231 lbs. No LEE, PND or orthopnea. His appetite is ok. He has been limiting his sodium intake to <1500 mg/day and has made many positive diet changes.     . Shortness  of breath/dyspnea on exertion? no  . Orthopnea/PND? no . Edema? no . Lightheadedness/dizziness? no . Daily weights at home? Yes; stable at 230-231 lbs . Blood pressure/heart rate monitoring at home? no . Following low-sodium/fluid-restricted diet? yes  HF Medications: Carvedilol 3.125 mg BID Entresto 49/51 mg BID Spironolactone 25 mg daily Bidil 20-37.5 mg TID Furosemide 40 mg daily Potassium 20 mEq daily  Has the patient been experiencing any side effects to the medications prescribed?  no  Does the patient have any problems obtaining medications due to transportation or finances?   Yes - No prescription insurance. Obtains Entresto through [employer]. Working on [employer].   Understanding of regimen: good Understanding of indications: good Potential of compliance: good Patient understands to avoid NSAIDs. Patient understands to avoid decongestants.    Pertinent Lab Values: . Serum creatinine 1.67, BUN 13, Potassium 4.4, Sodium 137  Vital Signs: . Weight: 234.2 lbs (last clinic weight: 233.6) . Blood pressure: 165/98 - did not take midday Bidil dose. Marland Kitchen Heart rate: 62   Assessment: 1. Systolic Heart Failure: Recent  Echo 10/20 w/ reduced LVEF 25-30% with mild LV dilation and mild LV hypertrophy, mildly decreased RV systolic function. Cause of cardiomyopathy may be long-standing uncontrolled HTN. LHC/RHC showed normal filling pressures and cardiac output after diuresis, no coronary disease on angiography. No drugs/ETOH/smoking. Mother with "heart disease" but he is not clear on the diagnosis.  - NYHA Class II, euvolemic on exam.  - BP elevated. 165/98 today. HR 62 - Labs: Scr stable  at 1.67, K 4.4 - Continue Lasix 40 mg daily. Renal function stable.  - Continuecarvedilol 3.125 mg BID.  -ContinueEntresto49-51 mg BID. Obtains through [employer] Patient Assistance. Will plan on increasing next visit if renal function remains stable.  - Continue  spironolactone25 mg daily.  - Increase Bidil to 2 tablets TID. Will work on applying for Patient Assistance. Samples given in clinic today.  -Will need repeat echo 3 months after med optimization  2. HTN: elevated today, 165/98 today. -Increase Bidil as above. Continue carvedilol, Entresto and spironolactone.     Plan: 1) Medication changes: Based on clinical presentation, vital signs and recent labs will Increase Bidil to 2 tablets TID.  2) Labs: Scr stable at 1.67, K 4.4 3) Follow-up: 4 weeks in [place], PharmD, BCPS, [facility] Pharmacist [phone]  Discussed patient with Dr. Lynelle Smoke.  Agree with assessment and plan.  Continue to monitor renal function.  Increase Bidil.    Bonnita Nasuti Pharm.D. CPP, BCPS Clinical Pharmacist [phone] 03/29/2019 10:26 PM

## 2019-04-04 NOTE — Telephone Encounter (Signed)
Advanced Heart Failure Patient Advocate Encounter   Patient was approved to receive Bidil from [place].  Effective dates: 03/28/2019 through 03/27/2020  Audry Riles, PharmD, BCPS, BCCP, CPP Heart Failure Clinic Pharmacist [phone]

## 2019-04-05 ENCOUNTER — Encounter: Payer: Self-pay | Admitting: Critical Care Medicine

## 2019-04-05 ENCOUNTER — Ambulatory Visit (HOSPITAL_BASED_OUTPATIENT_CLINIC_OR_DEPARTMENT_OTHER): Payer: Self-pay | Admitting: Pharmacist

## 2019-04-05 ENCOUNTER — Ambulatory Visit: Payer: Self-pay | Attending: Critical Care Medicine | Admitting: Critical Care Medicine

## 2019-04-05 ENCOUNTER — Other Ambulatory Visit: Payer: Self-pay

## 2019-04-05 VITALS — BP 120/80 | HR 73 | Temp 99.1°F | Ht 67.5 in | Wt 237.0 lb

## 2019-04-05 DIAGNOSIS — I5022 Chronic systolic (congestive) heart failure: Secondary | ICD-10-CM

## 2019-04-05 DIAGNOSIS — N182 Chronic kidney disease, stage 2 (mild): Secondary | ICD-10-CM

## 2019-04-05 DIAGNOSIS — I10 Essential (primary) hypertension: Secondary | ICD-10-CM

## 2019-04-05 DIAGNOSIS — Z6836 Body mass index (BMI) 36.0-36.9, adult: Secondary | ICD-10-CM

## 2019-04-05 DIAGNOSIS — Z23 Encounter for immunization: Secondary | ICD-10-CM

## 2019-04-05 DIAGNOSIS — F411 Generalized anxiety disorder: Secondary | ICD-10-CM

## 2019-04-05 NOTE — Progress Notes (Addendum)
Subjective:    Patient ID: Derek Reed, male    DOB: 03/14/1986, 33 y.o.   MRN: [id number]  33 y.o.M with telemedicine phone visit for CHF f/u and to establish for Primary care . This patient was admitted in October for acute pulmonary edema and hypertensive cardiomyopathy discharge summary is as below  Admit date:02/23/2019 Discharge date:02/26/2019  Time spent:31mnutes  Recommendations for Outpatient Follow-up: [facility]. [facility], please check be met at follow-up   Discharge Diagnoses: Principal Problem: Acute systolic CHF Hypertensive cardiomyopathy Borderline diabetes Hypokalemia HTN (hypertension) Elevated troponin CKD (chronic kidney disease), stage II Anxiety Acute systolic CHF ([place]) [place]   Discharge Condition:Stable  Diet recommendation:Carb modified, heart healthy      Filed Weights  02/24/19 0528 02/25/19 0616 02/26/19 0554 Weight: 105.1 kg 108.8 kg 105.1 kg   History of present illness: Derek Hooperis a 33y.o.malewith medical history significant ofhypertension not taking medications, CKD-2, peritonsillar abscess, anxiety, who presented with shortness of breath, for more than 2 weeks. He has dry cough. - COVID-19 test was negative, EKG noted LVH Chest x-ray showed increased cardiac silhouette and interstitial edema   Hospital Course:  Acute CHF/new onset -Hypertensive cardiomyopathy, has LVH on EKG -Clinically improved with diuresis, he was -5 L at discharge -2D echo with EF of 25 to 30%,cardiology consultunderwent left heart cath 10/16which showed minimal nonobstructive disease, consistent with nonischemic cardiomyopathy -Transition to oral Lasix, Entresto, Coreg and Aldactone at discharge -Seen by case management for patient assistance, he was given a mSystems developerand EDelene Lollcard -Educated regarding diet and lifestyle modification  Elevated troponin:  -Troponin156 -->153. 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  Since discharge the patient has been in the advanced heart failure clinic for follow-up as below OP CV f/u 10/27: 33y/o [name] with PMH of untreated HTN [facility]. He presented with acute dyspnea and was found to be markedly hypertensive and in acute pulmonary edema. He was started on IV diuretics and antihypertensives. 2D echo showed severely reduced LVEF25-30% with mild LV dilation and mild LV hypertrophy, mildly decreased RV systolic function. LHC/RHC showed normal filling pressures and cardiac output after diuresis, no coronary disease on angiography. Noh/odrugs/ETOH/smoking. Mother with "heart disease" but he is not clear on the diagnosis.   Primary etiology for his HF was felt to be poorly controlled HTN. After he was diuresed back to euvolemic state, he was transitioned to PO lasix. Also treated w/ Entresto, Spironolactone and Coreg. Discharge weight was 231 lb.   He presents to clinic today for post hospital f/u. Doing well. Denies exertional dyspnea. No weight gain or LEE. Compliant w/ meds. Tolerating well. No side effects. Has modified diet. Trying to eat healthier and reading food labels. Trying to keep Na intake under 1500 mg/day. Office weight 233 lb today. BP 122/80.  1. Systolic  Heart Failure: Recent Echo 10/20 w/ reduced LVEF 25-30% with mild LV dilation and mild LV hypertrophy, mildly decreased RV systolic function.  Cause of cardiomyopathy may  be long-standing uncontrolled HTN. LHC/RHC showed normal filling pressures and cardiac output after diuresis, no coronary disease on angiography. No drugs/ETOH/smoking. Mother with "heart disease" but he is not clear on the diagnosis. -NYHA Class II, euvolemic on exam. Wt stable compared to recent hospital d/c wt -BP improved. 122/80 today.  -Check BMP today -ContinueEntresto to49-51bid - Reduce Lasix to 20 mg daily - Continue spironolactone25 mg daily.  - ContinueCoreg 3.125 mg bid.  -Add Bidil 1 tablet tid - we discussed importance of daily wts and low sodium diet  - f/u w/ PharmD in 3 weeks for further med titration. -will ultimately need repeat echo 3 months after med optimization  2.RAX:ENMMHWK improved. 122/80 today. -HF meds per above.   At the last cardiology visit the patient was to continue Entresto twice daily current dose but to reduce Lasix to 20 mg daily and begin BiDil 1 tablet 3 times daily along with continuing Coreg and spironolactone.  The patient states he did not obtain the BiDil as he cannot afford it at his private pharmacy as he is self-pay status.  The patient states his breathing is better he is having no cough or chest pain he has no edema in the lower extremities.  He is taking all his medications as prescribed.  He had questions on what to do with the potassium dose as he is about out.  Note his potassium level is 4.6 at the last cardiology visit and he is on Aldactone.  04/06/19 Since the last visit which was a telephone visit this is a face-to-face visit.  The patient is improved with less shortness of breath no edema no chest pain.  He has been maintaining his medications appropriately.  Note his blood pressure today was 120/80 on arrival.  He has increased his  furosemide to 40 mg daily There is no other change in his medication at this time except he has been started on the BiDil  Past Medical History:  Diagnosis Date  . Acute CHF (congestive heart failure) ([place]) 02/24/2019  . Anxiety   . Hypertension   . Peritonsillar abscess      Family History  Problem Relation Age of Onset  . Hyperlipidemia Mother   . Diabetes Mother   . Stroke Mother   . Hypertension Mother   . Hypertension Father      Social History   Socioeconomic History  . Marital status: Single    Spouse name: Not on file  . Number of children: Not on file  . Years of education: Not on file  . Highest education level: Not on file  Occupational History  . Not on file  Tobacco Use  . Smoking status: Former Research scientist (life sciences)  . Smokeless tobacco: Never Used  . Tobacco comment: quit in January  Substance and Sexual Activity  . Alcohol use: No  . Drug use: No  . Sexual activity: Not on file  Other Topics Concern  . Not on file  Social History Narrative  . Not on file   Social Determinants of Health   Financial Resource Strain:   . Difficulty of Paying Living Expenses: Not on file  Food Insecurity:   . Worried About Charity fundraiser in the Last Year: Not on file  . Ran Out of Food in the Last Year: Not on file  Transportation Needs:   . Lack of Transportation (Medical): Not on file  . Lack of Transportation (Non-Medical): Not on file  Physical Activity:   . Days of Exercise  per Week: Not on file  . Minutes of Exercise per Session: Not on file  Stress:   . Feeling of Stress : Not on file  Social Connections:   . Frequency of Communication with Friends and Family: Not on file  . Frequency of Social Gatherings with Friends and Family: Not on file  . Attends Religious Services: Not on file  . Active Member of Clubs or Organizations: Not on file  . Attends Archivist Meetings: Not on file  . Marital Status: Not on file  Intimate Partner Violence:   . Fear  of Current or Ex-Partner: Not on file  . Emotionally Abused: Not on file  . Physically Abused: Not on file  . Sexually Abused: Not on file     No Known Allergies   Outpatient Medications Prior to Visit  Medication Sig Dispense Refill  . isosorbide-hydrALAZINE (BIDIL) 20-37.5 MG tablet Take 2 tablets by mouth 3 (three) times daily. 540 tablet 3  . spironolactone (ALDACTONE) 25 MG tablet Take 1 tablet (25 mg total) by mouth daily. 30 tablet 3  . carvedilol (COREG) 3.125 MG tablet Take 1 tablet (3.125 mg total) by mouth 2 (two) times daily with a meal. 60 tablet 3  . furosemide (LASIX) 40 MG tablet Take 1 tablet (40 mg total) by mouth daily. 30 tablet 1  . potassium chloride SA (KLOR-CON) 20 MEQ tablet Take 1 tablet (20 mEq total) by mouth daily. 30 tablet 1  . sacubitril-valsartan (ENTRESTO) 49-51 MG Take 1 tablet by mouth 2 (two) times daily. 60 tablet 6   No facility-administered medications prior to visit.      Review of Systems Constitutional:   No  weight loss, night sweats,  Fevers, chills, fatigue, lassitude. HEENT:   No headaches,  Difficulty swallowing,  Tooth/dental problems,  Sore throat,                No sneezing, itching, ear ache, nasal congestion, post nasal drip,   CV:  No chest pain,  Orthopnea, PND, swelling in lower extremities, anasarca, dizziness, palpitations  GI  No heartburn, indigestion, abdominal pain, nausea, vomiting, diarrhea, change in bowel habits, loss of appetite  Resp: No shortness of breath with exertion or at rest.  No excess mucus, no productive cough,  No non-productive cough,  No coughing up of blood.  No change in color of mucus.  No wheezing.  No chest wall deformity  Skin: no rash or lesions.  GU: no dysuria, change in color of urine, no urgency or frequency.  No flank pain.  MS:  No joint pain or swelling.  No decreased range of motion.  No back pain.  Psych:  No change in mood or affect. No depression or anxiety.  No memory loss.      Objective:   Physical Exam Vitals:   04/05/19 1038  BP: 120/80  Pulse: 73  Temp: 99.1 F (37.3 C)  TempSrc: Oral  SpO2: 97%  Weight: 237 lb (107.5 kg)  Height: 5' 7.5" (1.715 m)    Gen: Pleasant, well-nourished, in no distress,  normal affect  ENT: No lesions,  mouth clear,  oropharynx clear, no postnasal drip  Neck: No JVD, no TMG, no carotid bruits  Lungs: No use of accessory muscles, no dullness to percussion, clear without rales or rhonchi  Cardiovascular: RRR, heart sounds normal, no murmur or gallops, no peripheral edema  Abdomen: soft and NT, no HSM,  BS normal  Musculoskeletal: No deformities, no cyanosis or clubbing  Neuro: alert, non focal  Skin: Warm, no lesions or rashes  No results found.  BMP Latest Ref Rng & Units 05/16/2019 04/24/2019 03/27/2019  Glucose 70 - 99 mg/dL 102(H) 99 94  BUN 6 - 20 mg/dL _0 Creatinine 0.61 - 1.24 mg/dL 1.55(H) 1.66(H) 1.67(H)  Sodium 135 - 145 mmol/L 139 138 137  Potassium 3.5 - 5.1 mmol/L 4.4 4.2 4.4  Chloride 98 - 111 mmol/L 107 105 105  CO2 22 - 32 mmol/L 23 21(L) 23  Calcium 8.9 - 10.3 mg/dL 8.9 8.9 9.0       Assessment & Plan:  I personally reviewed all images and lab data in the [facility] system as well as any outside material available during this office visit and agree with the  radiology impressions.   Chronic systolic heart failure (HCC)due to Hypertension Chronic systolic heart failure stable at this time was due to hypertension  We will continue Entresto at 1   49-51 tablet twice daily, Aldactone 25 mg daily, potassium daily, BiDil 20-37.5 2 tablets 3 times daily, furosemide 40 mg daily, Coreg at all 3.125 mg twice daily  Follow-up per cardiology  HTN (hypertension) Blood pressure under excellent control with current cardiac program  CKD (chronic kidney disease), stage II Recent lab study in cardiology office showed improved renal function  GAD (generalized anxiety disorder) The patient's  generalized anxiety disorder is stable at this time   Rush was seen today for follow-up.  Diagnoses and all orders for this visit:  Chronic systolic heart failure (HCC)due to Hypertension  Essential hypertension  CKD (chronic kidney disease), stage II  BMI 36.0-36.9,adult  GAD (generalized anxiety disorder)

## 2019-04-05 NOTE — Progress Notes (Signed)
Pt. Is here for HTN follow up. Pt. Wants to know if he supposed to take Lasix one tablet or 1/2 tablet.

## 2019-04-05 NOTE — Progress Notes (Signed)
Patient presents for vaccination against influenza and tetanus per orders of Dr. Wright. Consent given. Counseling provided. No contraindications exists. Vaccine administered without incident.   

## 2019-04-06 NOTE — Assessment & Plan Note (Signed)
Blood pressure under excellent control with current cardiac program

## 2019-04-06 NOTE — Assessment & Plan Note (Signed)
Recent lab study in cardiology office showed improved renal function

## 2019-04-06 NOTE — Assessment & Plan Note (Signed)
Chronic systolic heart failure stable at this time was due to hypertension  We will continue Entresto at 1   49-51 tablet twice daily, Aldactone 25 mg daily, potassium daily, BiDil 20-37.5 2 tablets 3 times daily, furosemide 40 mg daily, Coreg at all 3.125 mg twice daily  Follow-up per cardiology

## 2019-04-06 NOTE — Assessment & Plan Note (Signed)
The patient's generalized anxiety disorder is stable at this time

## 2019-04-19 NOTE — Progress Notes (Signed)
PCP: Patient, No Pcp Per PCP-Cardiologist: Dr. Shirlee Latch  HPI:  33 y/o [name] with PMH of untreated HTN who was admitted to [facility] 02/24/19 w/ acute CHF. He presented with acute dyspnea and was found to be markedly hypertensive and in acute pulmonary edema. He was started on IV diuretics and antihypertensives. 2D echo showed severely reduced LVEF 25-30% with mild LV dilation and mild LV hypertrophy, mildly decreased RV systolic function. LHC/RHC showed normal filling pressures and cardiac output after diuresis, no coronary disease on angiography. No h/o drugs/ETOH/smoking. Mother with "heart disease" but he is not clear on the diagnosis.    Primary etiology for his HF was felt to be poorly controlled HTN. After he was diuresed back to euvolemic state, he was transitioned to PO lasix. Also treated w/ Entresto, Spironolactone and carvedilol. Discharge weight was 231 lbs.   He recently presented to clinic for follow up on 03/07/2019. At that visit he reported doing well. Denied exertional dyspnea. No weight gain or LEE. Compliant w/ medications and tolerating them well. Had modified diet. Trying to eat healthier and reading food labels. Trying to keep Na intake under 1500 mg/day. Office weight 233 lb today. BP 122/80.   Today he returns to HF clinic for pharmacist medication titration. At recent visits to clinic, Bidil was initiated and up-titrated to 2 tablets TID. He has been approved for [employer] assistance. Overall he is feeling well today. No dizziness, lightheadedness, chest pain or palpitations. His breathing is good. Has been increasing his activity level - walking in the park near his house 3 times per week. His weight at home has been stable at 230-232 lbs. He takes furosemide 40 mg daily and has not needed any extra. No LEE, PND or orthopnea. His appetite is fine. He is following a low sodium diet. Notes compliance with all medications.      . Shortness of breath/dyspnea on exertion? no   . Orthopnea/PND? no . Edema? no . Lightheadedness/dizziness? no . Daily weights at home? Yes; stable at 230-232 lbs . Blood pressure/heart rate monitoring at home? no . Following low-sodium/fluid-restricted diet? yes  HF Medications: Carvedilol 3.125 mg BID Entresto 49/51 mg BID Spironolactone 25 mg daily Bidil 20-37.5 mg 2 tablets TID Furosemide 40 mg daily Potassium 20 mEq daily  Has the patient been experiencing any side effects to the medications prescribed?  no  Does the patient have any problems obtaining medications due to transportation or finances?   Yes - No prescription insurance. Obtains Entresto through [employer]. And Bidil through [employer].  Understanding of regimen: good Understanding of indications: good Potential of compliance: good Patient understands to avoid NSAIDs. Patient understands to avoid decongestants.    Pertinent Lab Values: . Serum creatinine 1.66, BUN 16, Potassium 4.2, Sodium 138  Vital Signs:  . Weight: 233.2 lbs (last clinic weight: 234.2) . Blood pressure: 130/88 . Heart rate: 63   Assessment: 1. Systolic Heart Failure: Recent  Echo 10/20 w/ reduced LVEF 25-30% with mild LV dilation and mild LV hypertrophy, mildly decreased RV systolic function. Cause of cardiomyopathy may be long-standing uncontrolled HTN. LHC/RHC showed normal filling pressures and cardiac output after diuresis, no coronary disease on angiography. No drugs/ETOH/smoking. Mother with "heart disease" but he is not clear on the diagnosis.  - NYHA Class II, euvolemic on exam.  - Vitals stable, BP 130/88, HR 62 - Labs stable: Scr 1.66, K 4.2 - Continue Lasix 40 mg daily. Renal function stable.  - Continuecarvedilol 3.125 mg BID.  -IncreaseEntrestoto [id number]  mg BID. Obtains through [employer] Patient Assistance. Repeat BMET at next clinic visit - Continue spironolactone25 mg daily.  - Continue Bidil 2 tablets TID. Obtains through [employer].  -Will  need repeat echo 3 months after med optimization  2. HTN:  - Continue carvedilol, Entresto, Bidil and spironolactone.     Plan: 1) Medication changes: Based on clinical presentation, vital signs and recent labs will increase Entresto to 97/103 mg BID.  2) Labs: Scr 1.66, K 4.2 3) Follow-up: HF Clinic with Dr. Aundra Dubin in 3 weeks.   Audry Riles, PharmD, BCPS, BCCP, CPP Heart Failure Clinic Pharmacist [phone]

## 2019-04-24 ENCOUNTER — Other Ambulatory Visit: Payer: Self-pay

## 2019-04-24 ENCOUNTER — Ambulatory Visit (HOSPITAL_COMMUNITY)
Admission: RE | Admit: 2019-04-24 | Discharge: 2019-04-24 | Disposition: A | Discharge status: Home / Self Care | Payer: Self-pay | Source: Ambulatory Visit | Attending: Cardiology | Admitting: Cardiology

## 2019-04-24 VITALS — BP 130/88 | HR 63 | Wt 233.2 lb

## 2019-04-24 DIAGNOSIS — Z79899 Other long term (current) drug therapy: Secondary | ICD-10-CM | POA: Insufficient documentation

## 2019-04-24 DIAGNOSIS — I5022 Chronic systolic (congestive) heart failure: Secondary | ICD-10-CM | POA: Insufficient documentation

## 2019-04-24 DIAGNOSIS — Z8249 Family history of ischemic heart disease and other diseases of the circulatory system: Secondary | ICD-10-CM | POA: Insufficient documentation

## 2019-04-24 DIAGNOSIS — I110 Hypertensive heart disease with heart failure: Secondary | ICD-10-CM | POA: Insufficient documentation

## 2019-04-24 LAB — BASIC METABOLIC PANEL
Anion gap: 12 (ref 5–15)
BUN: 16 mg/dL (ref 6–20)
CO2: 21 mmol/L — ABNORMAL LOW (ref 22–32)
Calcium: 8.9 mg/dL (ref 8.9–10.3)
Chloride: 105 mmol/L (ref 98–111)
Creatinine, Ser: 1.66 mg/dL — ABNORMAL HIGH (ref 0.61–1.24)
GFR calc Af Amer: >60 mL/min (ref >60)
GFR calc non Af Amer: 53 mL/min — ABNORMAL LOW (ref >60)
Glucose, Bld: 99 mg/dL (ref 70–99)
Potassium: 4.2 mmol/L (ref 3.5–5.1)
Sodium: 138 mmol/L (ref 135–145)

## 2019-04-24 LAB — BRAIN NATRIURETIC PEPTIDE: B Natriuretic Peptide: 34.1 pg/mL (ref 0.0–100.0)

## 2019-04-24 MED ORDER — SACUBITRIL-VALSARTAN 97-103 MG PO TABS: 1.0000 | ORAL_TABLET | Freq: Two times a day (BID) | ORAL | 3 refills | Status: DC

## 2019-04-24 NOTE — Patient Instructions (Addendum)
It was a pleasure seeing you today!  MEDICATIONS: -We are changing your medications today -Increase Entresto to 97/103 mg (1 tab) twice daily. -Call if you have questions about your medications.  LABS: -We will call you if your labs need attention.  NEXT APPOINTMENT: Return to clinic in 3 weeks with Dr. Aundra Dubin.  In general, to take care of your heart failure: -Limit your fluid intake to 2 Liters (half-gallon) per day.   -Limit your salt intake to ideally 2-3 grams (2000-3000 mg) per day. -Weigh yourself daily and record, and bring that "weight diary" to your next appointment.  (Weight gain of 2-3 pounds in 1 day typically means fluid weight.) -The medications for your heart are to help your heart and help you live longer.   -Please contact us before stopping any of your heart medications.  Call the clinic at [phone] with questions or to reschedule future appointments.

## 2019-04-25 MED FILL — CARVEDILOL 3.125 MG TABLET: 3.125 | 30 days supply | Qty: 60 | Fill #1

## 2019-04-25 MED FILL — SPIRONOLACTONE 25 MG TABLET: 25 | 30 days supply | Qty: 30 | Fill #1

## 2019-04-25 MED FILL — POTASSIUM CL ER 20 MEQ TAB: 20 | 30 days supply | Qty: 30 | Fill #1

## 2019-04-25 MED FILL — FUROSEMIDE 40 MG TAB: 40 | 30 days supply | Qty: 15 | Fill #1

## 2019-05-16 ENCOUNTER — Ambulatory Visit (HOSPITAL_COMMUNITY)
Admission: RE | Admit: 2019-05-16 | Discharge: 2019-05-16 | Disposition: A | Discharge status: Home / Self Care | Payer: Self-pay | Source: Ambulatory Visit | Attending: Cardiology | Admitting: Cardiology

## 2019-05-16 ENCOUNTER — Encounter (HOSPITAL_COMMUNITY): Payer: Self-pay | Admitting: Cardiology

## 2019-05-16 ENCOUNTER — Other Ambulatory Visit: Payer: Self-pay

## 2019-05-16 VITALS — BP 124/73 | HR 79 | Wt 233.0 lb

## 2019-05-16 DIAGNOSIS — I5022 Chronic systolic (congestive) heart failure: Secondary | ICD-10-CM

## 2019-05-16 DIAGNOSIS — Z8349 Family history of other endocrine, nutritional and metabolic diseases: Secondary | ICD-10-CM | POA: Insufficient documentation

## 2019-05-16 DIAGNOSIS — Z79899 Other long term (current) drug therapy: Secondary | ICD-10-CM | POA: Insufficient documentation

## 2019-05-16 DIAGNOSIS — Z87891 Personal history of nicotine dependence: Secondary | ICD-10-CM | POA: Insufficient documentation

## 2019-05-16 DIAGNOSIS — I428 Other cardiomyopathies: Secondary | ICD-10-CM | POA: Insufficient documentation

## 2019-05-16 DIAGNOSIS — Z8249 Family history of ischemic heart disease and other diseases of the circulatory system: Secondary | ICD-10-CM | POA: Insufficient documentation

## 2019-05-16 DIAGNOSIS — Z823 Family history of stroke: Secondary | ICD-10-CM | POA: Insufficient documentation

## 2019-05-16 DIAGNOSIS — Z833 Family history of diabetes mellitus: Secondary | ICD-10-CM | POA: Insufficient documentation

## 2019-05-16 DIAGNOSIS — I110 Hypertensive heart disease with heart failure: Secondary | ICD-10-CM | POA: Insufficient documentation

## 2019-05-16 LAB — BASIC METABOLIC PANEL
Anion gap: 9 (ref 5–15)
BUN: 15 mg/dL (ref 6–20)
CO2: 23 mmol/L (ref 22–32)
Calcium: 8.9 mg/dL (ref 8.9–10.3)
Chloride: 107 mmol/L (ref 98–111)
Creatinine, Ser: 1.55 mg/dL — ABNORMAL HIGH (ref 0.61–1.24)
GFR calc Af Amer: >60 mL/min (ref >60)
GFR calc non Af Amer: 57 mL/min — ABNORMAL LOW (ref >60)
Glucose, Bld: 102 mg/dL — ABNORMAL HIGH (ref 70–99)
Potassium: 4.4 mmol/L (ref 3.5–5.1)
Sodium: 139 mmol/L (ref 135–145)

## 2019-05-16 MED ORDER — CARVEDILOL 6.25 MG PO TABS: 6.2500 mg | ORAL_TABLET | Freq: Two times a day (BID) | ORAL | 5 refills | Status: DC

## 2019-05-16 MED ORDER — FUROSEMIDE 40 MG PO TABS: 40.0000 mg | ORAL_TABLET | Freq: Every day | ORAL | 5 refills | Status: DC | PRN

## 2019-05-16 MED FILL — CARVEDILOL 6.25 MG TABLET: 6.25 | 30 days supply | Qty: 60 | Fill #0

## 2019-05-16 MED FILL — FUROSEMIDE 40 MG TAB: 40 | 30 days supply | Qty: 30 | Fill #0

## 2019-05-16 NOTE — Patient Instructions (Addendum)
CHANGE Lasix to as needed.  You can take Lasix 40mg  (1 tab) as needed for weight gain or shortness of breath.  INCREASE Coreg 6.25mg  ( 1 tab) twice a day  Your physician has requested that you have an echocardiogram. Echocardiography is a painless test that uses sound waves to create images of your heart. It provides your doctor with information about the size and shape of your heart and how well your heart's chambers and valves are working. This procedure takes approximately one hour. There are no restrictions for this procedure.  Your physician recommends that you schedule a follow-up appointment in: 2 months with Dr  Please call office at [phone] option [phone] if you have any questions or concerns.   At the [facility], you and your health needs are our priority. As part of our continuing mission to provide you with exceptional heart care, we have created designated Provider Care Teams. These Care Teams include your primary Cardiologist (physician) and Advanced Practice Providers (APPs- Physician Assistants and Nurse Practitioners) who all work together to provide you with the care you need, when you need it.   You may see any of the following providers on your designated Care Team at your next follow up: [phone] Dr Marland Kitchen . Dr Arvilla Meres . Marca Ancona, NP . Tonye Becket, PA . Robbie Lis, PharmD   Please be sure to bring in all your medications bottles to every appointment.

## 2019-05-16 NOTE — Progress Notes (Signed)
Advanced Heart Failure Clinic Note   Referring Physician: PCP: Patient, No Pcp Per Cardiologist: Dr. Shirlee Latch  34 y.o. with PMH of untreated HTN who was admitted to [facility] 02/24/19 w/ acute CHF. He presented with acute dyspnea and was found to be markedly hypertensive and in acute pulmonary edema. He was started on IV diuretics and antihypertensives. 2D echo showed severely reduced LVEF 25-30% with mild LV dilation and mild LV hypertrophy, mildly decreased RV systolic function. LHC/RHC showed normal filling pressures and cardiac output after diuresis, no coronary disease on angiography. No h/o drugs/ETOH/smoking. Mother with "heart disease" but he is not clear on the diagnosis.   Primary etiology for his HF was felt to be poorly controlled HTN. After he was diuresed back to euvolemic state, he was transitioned to PO lasix. Also treated w/ Entresto, Spironolactone and Coreg. Discharge weight was 231 lb.   Medications have been titrated as an outpatient.  BP is controlled.  He has been doing well symptomatically, no longer having significant exertional dyspnea.  No orthopnea/PND.  No chest pain.  No lightheadedness.  He works as a Civil [employer] fast streamer.    PMH: 1. HTN 2. Chronic systolic CHF: Nonischemic cardiomyopathy.  - Echo (10/20): EF 25-30%, mildly decreased RV systolic function.  - LHC/RHC (10/20): No significant CAD.  Mean RA 3, PA 32/10, mean PCWP 12, CI 2.69.   Review of Systems: All systems reviewed and negative except as per HPI.   Current Outpatient Medications  Medication Sig Dispense Refill  . carvedilol (COREG) 6.25 MG tablet Take 1 tablet (6.25 mg total) by mouth 2 (two) times daily with a meal. 60 tablet 5  . furosemide (LASIX) 40 MG tablet Take 1 tablet (40 mg total) by mouth daily as needed for fluid (and shortness of breath). 30 tablet 5  . isosorbide-hydrALAZINE (BIDIL) 20-37.5 MG tablet Take 2 tablets by mouth 3 (three) times daily. 540 tablet 3  . potassium chloride SA  (KLOR-CON) 20 MEQ tablet Take 1 tablet (20 mEq total) by mouth daily. 30 tablet 1  . sacubitril-valsartan (ENTRESTO) 97-103 MG Take 1 tablet by mouth 2 (two) times daily. 180 tablet 3  . spironolactone (ALDACTONE) 25 MG tablet Take 1 tablet (25 mg total) by mouth daily. 30 tablet 3   No current facility-administered medications for this encounter.    No Known Allergies    Social History   Socioeconomic History  . Marital status: Single    Spouse name: Not on file  . Number of children: Not on file  . Years of education: Not on file  . Highest education level: Not on file  Occupational History  . Not on file  Tobacco Use  . Smoking status: Former Games developer  . Smokeless tobacco: Never Used  . Tobacco comment: quit in January  Substance and Sexual Activity  . Alcohol use: No  . Drug use: No  . Sexual activity: Not on file  Other Topics Concern  . Not on file  Social History Narrative  . Not on file   Social Determinants of Health   Financial Resource Strain:   . Difficulty of Paying Living Expenses: Not on file  Food Insecurity:   . Worried About Programme researcher, broadcasting/film/video in the Last Year: Not on file  . Ran Out of Food in the Last Year: Not on file  Transportation Needs:   . Lack of Transportation (Medical): Not on file  . Lack of Transportation (Non-Medical): Not on file  Physical Activity:   .  Days of Exercise per Week: Not on file  . Minutes of Exercise per Session: Not on file  Stress:   . Feeling of Stress : Not on file  Social Connections:   . Frequency of Communication with Friends and Family: Not on file  . Frequency of Social Gatherings with Friends and Family: Not on file  . Attends Religious Services: Not on file  . Active Member of Clubs or Organizations: Not on file  . Attends Archivist Meetings: Not on file  . Marital Status: Not on file  Intimate Partner Violence:   . Fear of Current or Ex-Partner: Not on file  . Emotionally Abused: Not on file   . Physically Abused: Not on file  . Sexually Abused: Not on file      Family History  Problem Relation Age of Onset  . Hyperlipidemia Mother   . Diabetes Mother   . Stroke Mother   . Hypertension Mother   . Hypertension Father     Vitals:   05/16/19 1340  BP: 124/73  Pulse: 79  SpO2: 100%  Weight: 105.7 kg (233 lb)     PHYSICAL EXAM: General: NAD Neck: No JVD, no thyromegaly or thyroid nodule.  Lungs: Clear to auscultation bilaterally with normal respiratory effort. CV: Nondisplaced PMI.  Heart regular S1/S2, no S3/S4, no murmur.  No peripheral edema.  No carotid bruit.  Normal pedal pulses.  Abdomen: Soft, nontender, no hepatosplenomegaly, no distention.  Skin: Intact without lesions or rashes.  Neurologic: Alert and oriented x 3.  Psych: Normal affect. Extremities: No clubbing or cyanosis.  HEENT: Normal.   ASSESSMENT & PLAN:  1. Chronic systolic CHF: Nonischemic cardiomyopathy.  Echo 10/20 w/ reduced LVEF 25-30% with mild LV dilation and mild LV hypertrophy, mildly decreased RV systolic function. Cause of cardiomyopathy may be long-standing uncontrolled HTN versus viral myocarditis. LHC/RHC showed normal filling pressures and cardiac output after diuresis, no coronary disease on angiography. No drugs/ETOH/smoking. Mother with "heart disease" but he is not clear on the diagnosis. NYHA class II symptoms, euvolemic.  - Continue Entresto 97/103 bid - He has actually been using Lasix prn, can continue just using Lasix as a prn.  - Continue spironolactone 25 mg daily.   - Increase Coreg to 6.25 mg bid.   - Continue Bidili 2 tablets tid  - we discussed importance of daily wts and low sodium diet  - Would like to get cardiac MRI but he does not have insurance yet.  - I will arrange for repeat echo in 2 more months to decide on ICD.  Narrow QRS so not CRT candidate.  2. HTN: BP controlled on current regimen.   Followup with me in 2 months with echo.   Loralie Champagne, MD  05/16/19

## 2019-05-23 ENCOUNTER — Telehealth (HOSPITAL_COMMUNITY): Payer: Self-pay

## 2019-05-23 ENCOUNTER — Other Ambulatory Visit: Payer: Self-pay

## 2019-05-23 ENCOUNTER — Ambulatory Visit (HOSPITAL_COMMUNITY)
Admission: RE | Admit: 2019-05-23 | Discharge: 2019-05-23 | Disposition: A | Discharge status: Home / Self Care | Payer: Self-pay | Source: Ambulatory Visit | Attending: Cardiology | Admitting: Cardiology

## 2019-05-23 DIAGNOSIS — I5022 Chronic systolic (congestive) heart failure: Secondary | ICD-10-CM | POA: Insufficient documentation

## 2019-05-23 NOTE — Telephone Encounter (Signed)
Pt returned call he is aware of results.

## 2019-05-23 NOTE — Telephone Encounter (Signed)
-----   Message from Laurey Morale, MD sent at 05/23/2019  1:47 PM EST ----- Randie Heinz news, EF improved to normal range. Continue current meds for now.

## 2019-05-23 NOTE — Progress Notes (Signed)
  Echocardiogram 2D Echocardiogram has been performed.  Derek Reed 05/23/2019, 9:52 AM

## 2019-05-26 ENCOUNTER — Other Ambulatory Visit: Payer: Self-pay | Admitting: Critical Care Medicine

## 2019-05-26 MED FILL — CARVEDILOL 3.125 MG TABLET: 3.125 | 30 days supply | Qty: 60 | Fill #2

## 2019-05-26 MED FILL — SPIRONOLACTONE 25 MG TABLET: 25 | 30 days supply | Qty: 30 | Fill #2

## 2019-05-29 ENCOUNTER — Other Ambulatory Visit: Payer: Self-pay | Admitting: Critical Care Medicine

## 2019-05-29 MED FILL — POTASSIUM CL ER 20 MEQ TAB: 20 | 30 days supply | Qty: 30 | Fill #0

## 2019-05-29 MED FILL — CARVEDILOL 6.25 MG TABLET: 6.25 | 30 days supply | Qty: 60 | Fill #0

## 2019-05-30 ENCOUNTER — Other Ambulatory Visit: Payer: Self-pay

## 2019-05-30 ENCOUNTER — Encounter (HOSPITAL_BASED_OUTPATIENT_CLINIC_OR_DEPARTMENT_OTHER): Payer: Self-pay | Admitting: Emergency Medicine

## 2019-05-30 ENCOUNTER — Emergency Department (HOSPITAL_BASED_OUTPATIENT_CLINIC_OR_DEPARTMENT_OTHER)
Admission: EM | Admit: 2019-05-30 | Discharge: 2019-05-30 | Disposition: A | Discharge status: Home / Self Care | Payer: Self-pay | Attending: Emergency Medicine | Admitting: Emergency Medicine

## 2019-05-30 DIAGNOSIS — L02811 Cutaneous abscess of head [any part, except face]: Secondary | ICD-10-CM | POA: Insufficient documentation

## 2019-05-30 DIAGNOSIS — Z87891 Personal history of nicotine dependence: Secondary | ICD-10-CM | POA: Insufficient documentation

## 2019-05-30 DIAGNOSIS — I5022 Chronic systolic (congestive) heart failure: Secondary | ICD-10-CM | POA: Insufficient documentation

## 2019-05-30 DIAGNOSIS — L0291 Cutaneous abscess, unspecified: Secondary | ICD-10-CM

## 2019-05-30 DIAGNOSIS — N182 Chronic kidney disease, stage 2 (mild): Secondary | ICD-10-CM | POA: Insufficient documentation

## 2019-05-30 DIAGNOSIS — I130 Hypertensive heart and chronic kidney disease with heart failure and stage 1 through stage 4 chronic kidney disease, or unspecified chronic kidney disease: Secondary | ICD-10-CM | POA: Insufficient documentation

## 2019-05-30 DIAGNOSIS — Z79899 Other long term (current) drug therapy: Secondary | ICD-10-CM | POA: Insufficient documentation

## 2019-05-30 HISTORY — DX: Essential (primary) hypertension: I10

## 2019-05-30 MED ORDER — DOXYCYCLINE HYCLATE 100 MG PO CAPS: 100.0000 mg | ORAL_CAPSULE | Freq: Two times a day (BID) | ORAL | 0 refills | Status: DC

## 2019-05-30 MED ORDER — LIDOCAINE HCL (PF) 1 % IJ SOLN
5.0000 mL | Freq: Once | INTRAMUSCULAR | Status: AC
  Administered 2019-05-30: 5 mL via INTRADERMAL
  Filled 2019-05-30: qty 5

## 2019-05-30 MED FILL — DOXYCYCLINE HYCLATE 100 MG: 100 | 10 days supply | Qty: 20 | Fill #0

## 2019-05-30 NOTE — ED Notes (Signed)
AVS instructions reviewed with pt, discussed abx prescribed. Importance of monitoring for signs and symptoms of infection. Also discussed need to wash hands well prior to reapplying or changing dsg at site

## 2019-05-30 NOTE — Discharge Instructions (Signed)
Take antibiotics as prescribed and complete the full course. Apply warm compresses for 20 minutes at a time 3 times daily. Remove packing on Thursday if it has not fallen out on its own. Recheck for any concerns.

## 2019-05-30 NOTE — ED Provider Notes (Signed)
[facility] EMERGENCY DEPARTMENT Provider Note   CSN: [id number] Arrival date & time: 05/30/19  3300     History Chief Complaint  Patient presents with  . Abscess    Derek Reed is a 34 y.o. male.  34yo male presents with complaint of left ear swelling and pain. Patient first notice swelling behind the ear 3 days ago, became progressively more swollen and now tender. Denies drainage from the ear.  No other complaints or concerns.         Past Medical History:  Diagnosis Date  . Acute CHF (congestive heart failure) (HCC) 02/24/2019  . Anxiety   . Hypertension   . Peritonsillar abscess     Patient Active Problem List   Diagnosis Date Noted  . HTN (hypertension) 02/24/2019  . 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 GAD (generalized anxiety disorder) 03/26/2014  . BMI 36.0-36.9,adult 03/26/2014    Past Surgical History:  Procedure Laterality Date  . INCISION AND DRAINAGE OF PERITONSILLAR ABCESS    . RIGHT/LEFT HEART CATH AND CORONARY ANGIOGRAPHY N/A 02/24/2019   Procedure: RIGHT/LEFT HEART CATH AND CORONARY ANGIOGRAPHY;  Surgeon: Laurey Morale, MD;  Location: [facility] INVASIVE CV LAB;  Service: Cardiovascular;  Laterality: N/A;       Family History  Problem Relation Age of Onset  . Hyperlipidemia Mother   . Diabetes Mother   . Stroke Mother   . Hypertension Mother   . Hypertension Father     Social History   Tobacco Use  . Smoking status: Former Games developer  . Smokeless tobacco: Never Used  . Tobacco comment: quit in January  Substance Use Topics  . Alcohol use: No  . Drug use: No    Home Medications Prior to Admission medications   Medication Sig Start Date End Date Taking? Authorizing Provider  carvedilol (COREG) 6.25 MG tablet Take 1 tablet (6.25 mg total) by mouth 2 (two) times daily with a meal. 05/16/19   Laurey Morale, MD  doxycycline (VIBRAMYCIN) 100 MG capsule  Take 1 capsule (100 mg total) by mouth 2 (two) times daily. 05/30/19   Jeannie Fend, PA-C  furosemide (LASIX) 40 MG tablet Take 1 tablet (40 mg total) by mouth daily as needed for fluid (and shortness of breath). 05/16/19   Laurey Morale, MD  isosorbide-hydrALAZINE (BIDIL) 20-37.5 MG tablet Take 2 tablets by mouth 3 (three) times daily. 03/28/19   Laurey Morale, MD  potassium chloride SA (KLOR-CON) 20 MEQ tablet TAKE 1 TABLET (20 MEQ TOTAL) BY MOUTH DAILY. 05/26/19   Hoy Register, MD  sacubitril-valsartan (ENTRESTO) 97-103 MG Take 1 tablet by mouth 2 (two) times daily. 04/24/19   Laurey Morale, MD  spironolactone (ALDACTONE) 25 MG tablet Take 1 tablet (25 mg total) by mouth daily. 03/14/19   Storm Frisk, MD    Allergies    Patient has no known allergies.  Review of Systems   Review of Systems  Constitutional: Negative for fever.  HENT: Negative for ear discharge, ear pain and facial swelling.   Musculoskeletal: Negative for neck pain and neck stiffness.  Skin: Negative for rash and wound.  Allergic/Immunologic: Negative for immunocompromised state.  Hematological: Negative for adenopathy.  All other systems reviewed and are negative.   Physical Exam Updated Vital Signs BP 136/88 (BP Location: Right Arm)   Pulse 67   Temp 98.5 F (36.9 C) (Oral)   Resp 18  Ht 5\' 6"  (1.676 m)   Wt 105.7 kg   SpO2 98%   BMI 37.61 kg/m   Physical Exam Vitals and nursing note reviewed.  Constitutional:      General: He is not in acute distress.    Appearance: He is well-developed. He is not diaphoretic.  HENT:     Head: Normocephalic and atraumatic.   Pulmonary:     Effort: Pulmonary effort is normal.  Musculoskeletal:     Cervical back: Neck supple. No tenderness.  Lymphadenopathy:     Cervical: No cervical adenopathy.  Skin:    General: Skin is warm and dry.     Findings: Erythema present.  Neurological:     Mental Status: He is alert and oriented to person, place,  and time.  Psychiatric:        Behavior: Behavior normal.     ED Results / Procedures / Treatments   Labs (all labs ordered are listed, but only abnormal results are displayed) Labs Reviewed - No data to display  EKG None  Radiology No results found.  Procedures .Marland KitchenIncision and Drainage  Date/Time: 05/30/2019 10:19 AM Performed by: Tacy Learn, PA-C Authorized by: Tacy Learn, PA-C   Consent:    Consent obtained:  Verbal   Consent given by:  Patient   Risks discussed:  Bleeding, incomplete drainage, pain and damage to other organs   Alternatives discussed:  No treatment Universal protocol:    Procedure explained and questions answered to patient or proxy's satisfaction: yes     Relevant documents present and verified: yes     Test results available and properly labeled: yes     Imaging studies available: yes     Required blood products, implants, devices, and special equipment available: yes     Site/side marked: yes     Immediately prior to procedure a time out was called: yes     Patient identity confirmed:  Verbally with patient Location:    Type:  Abscess   Size:  2.5cm x 2.5cm   Location:  Head   Head location:  Scalp Pre-procedure details:    Skin preparation:  Betadine Anesthesia (see MAR for exact dosages):    Anesthesia method:  Local infiltration   Local anesthetic:  Lidocaine 1% w/o epi Procedure type:    Complexity:  Complex Procedure details:    Incision types:  Single straight   Incision depth:  Subcutaneous   Scalpel blade:  11   Wound management:  Probed and deloculated, irrigated with saline and extensive cleaning   Drainage:  Purulent   Drainage amount:  Moderate   Packing materials:  1/2 in iodoform gauze   Amount 1/2" iodoform:  1" Post-procedure details:    Patient tolerance of procedure:  Tolerated well, no immediate complications   (including critical care time)  Medications Ordered in ED Medications  lidocaine (PF)  (XYLOCAINE) 1 % injection 5 mL (5 mLs Intradermal Given 05/30/19 1002)    ED Course  I have reviewed the triage vital signs and the nursing notes.  Pertinent labs & imaging results that were available during my care of the patient were reviewed by me and considered in my medical decision making (see chart for details).  Clinical Course as of May 29 1018  Tue May 29, 7568  [id number] 34 year old male with abscess posterior to left ear.   I&D with packing placed.  Patient placed on doxycycline, recommend warm compresses and packing removal in 2 days.   [LM]  Clinical Course User Index [LM] Alden Hipp   MDM Rules/Calculators/A&P                      Final Clinical Impression(s) / ED Diagnoses Final diagnoses:  Abscess    Rx / DC Orders ED Discharge Orders         Ordered    doxycycline (VIBRAMYCIN) 100 MG capsule  2 times daily     05/30/19 1012           Alden Hipp 05/30/19 1021    Little, Ambrose Finland, MD 05/31/19 1133

## 2019-05-30 NOTE — ED Triage Notes (Signed)
Pt reports possible insect bite to left ear x 3 days ago. C/o apparent swelling behind ear. Pt denies injury.

## 2019-06-05 ENCOUNTER — Ambulatory Visit: Payer: Self-pay | Admitting: Critical Care Medicine

## 2019-06-06 ENCOUNTER — Other Ambulatory Visit: Payer: Self-pay

## 2019-06-06 ENCOUNTER — Ambulatory Visit: Payer: Self-pay | Attending: Critical Care Medicine | Admitting: Critical Care Medicine

## 2019-06-06 ENCOUNTER — Encounter: Payer: Self-pay | Admitting: Critical Care Medicine

## 2019-06-06 VITALS — BP 145/85 | HR 60 | Temp 98.3°F | Resp 16 | Wt 235.2 lb

## 2019-06-06 DIAGNOSIS — I5022 Chronic systolic (congestive) heart failure: Secondary | ICD-10-CM

## 2019-06-06 DIAGNOSIS — Z6836 Body mass index (BMI) 36.0-36.9, adult: Secondary | ICD-10-CM

## 2019-06-06 DIAGNOSIS — I10 Essential (primary) hypertension: Secondary | ICD-10-CM

## 2019-06-06 DIAGNOSIS — N182 Chronic kidney disease, stage 2 (mild): Secondary | ICD-10-CM

## 2019-06-06 DIAGNOSIS — F411 Generalized anxiety disorder: Secondary | ICD-10-CM

## 2019-06-06 MED ORDER — BIDIL 20-37.5 MG PO TABS: 2.0000 | ORAL_TABLET | Freq: Three times a day (TID) | ORAL | 3 refills | Status: DC

## 2019-06-06 MED ORDER — SPIRONOLACTONE 25 MG PO TABS: 25.0000 mg | ORAL_TABLET | Freq: Every day | ORAL | 3 refills | Status: DC

## 2019-06-06 NOTE — Assessment & Plan Note (Signed)
Treatment for heart failure is adequate controlling blood pressure at this time and will maintain current program

## 2019-06-06 NOTE — Progress Notes (Signed)
Subjective:    Patient ID: Derek Reed, male    DOB: July 07, 1985, 34 y.o.   MRN: [id number]  33 y.o.M with telemedicine phone visit for CHF f/u and to establish for Primary care . This patient was admitted in October for acute pulmonary edema and hypertensive cardiomyopathy discharge summary is as below  Admit date:02/23/2019 Discharge date:02/26/2019  Time spent:46mnutes  Recommendations for Outpatient Follow-up: 1. [facility], please check be met at follow-up   Discharge Diagnoses: Principal Problem: Acute systolic CHF Hypertensive cardiomyopathy Borderline diabetes Hypokalemia HTN (hypertension) Elevated troponin CKD (chronic kidney disease), stage II Anxiety [place] systolic [place]   Discharge Condition:Stable  Diet recommendation:Carb modified, heart healthy      Filed Weights  02/24/19 0528 02/25/19 0616 02/26/19 0554 Weight: 105.1 kg 108.8 kg 105.1 kg   History of present illness: Derek Hooperis a 34y.o.malewith medical history significant ofhypertension not taking medications, CKD-2, peritonsillar abscess, anxiety, who presented with shortness of breath, for more than 2 weeks. He has dry cough. - COVID-19 test was negative, EKG noted LVH Chest x-ray showed increased cardiac silhouette and interstitial edema   Hospital Course:  Acute CHF/new onset -Hypertensive cardiomyopathy, has LVH on EKG -Clinically improved with diuresis, he was -5 L at discharge -2D echo with EF of 25 to 30%,cardiology consultunderwent left heart cath 10/16which showed minimal nonobstructive disease, consistent with nonischemic cardiomyopathy -Transition to oral Lasix, Entresto, Coreg and Aldactone at discharge -Seen by case management for patient assistance, he was given a mSystems developerand EDelene Lollcard -Educated regarding diet and lifestyle modification  Elevated  troponin: -Troponin156 -->153. 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  Since discharge the patient has been in the advanced heart failure clinic for follow-up as below OP CV f/u 10/27: 34y/o [name] with PMH of untreated HTN [facility]. He presented with acute dyspnea and was found to be markedly hypertensive and in acute pulmonary edema. He was started on IV diuretics and antihypertensives. 2D echo showed severely reduced LVEF25-30% with mild LV dilation and mild LV hypertrophy, mildly decreased RV systolic function. LHC/RHC showed normal filling pressures and cardiac output after diuresis, no coronary disease on angiography. Noh/odrugs/ETOH/smoking. Mother with "heart disease" but he is not clear on the diagnosis.   Primary etiology for his HF was felt to be poorly controlled HTN. After he was diuresed back to euvolemic state, he was transitioned to PO lasix. Also treated w/ Entresto, Spironolactone and Coreg. Discharge weight was 231 lb.   He presents to clinic today for post hospital f/u. Doing well. Denies exertional dyspnea. No weight gain or LEE. Compliant w/ meds. Tolerating well. No side effects. Has modified diet. Trying to eat healthier and reading food labels. Trying to keep Na intake under 1500 mg/day. Office weight 233 lb today. BP 122/80.  1.  Systolic Heart Failure: Recent Echo 10/20 w/ reduced LVEF 25-30% with mild LV dilation and mild LV hypertrophy, mildly decreased RV systolic function.  Cause of cardiomyopathy may  be long-standing uncontrolled HTN. LHC/RHC showed normal filling pressures and cardiac output after diuresis, no coronary disease on angiography. No drugs/ETOH/smoking. Mother with "heart disease" but he is not clear on the diagnosis. -NYHA Class II, euvolemic on exam. Wt stable compared to recent hospital d/c wt -BP improved. 122/80 today.  -Check BMP today -ContinueEntresto to49-51bid - Reduce Lasix to 20 mg daily - Continue spironolactone25 mg daily.  - ContinueCoreg 3.125 mg bid.  -Add Bidil 1 tablet tid - we discussed importance of daily wts and low sodium diet  - f/u w/ PharmD in 3 weeks for further med titration. -will ultimately need repeat echo 3 months after med optimization  2.FIE:PPIRJJO improved. 122/80 today. -HF meds per above.   At the last cardiology visit the patient was to continue Entresto twice daily current dose but to reduce Lasix to 20 mg daily and begin BiDil 1 tablet 3 times daily along with continuing Coreg and spironolactone.  The patient states he did not obtain the BiDil as he cannot afford it at his private pharmacy as he is self-pay status.  The patient states his breathing is better he is having no cough or chest pain he has no edema in the lower extremities.  He is taking all his medications as prescribed.  He had questions on what to do with the potassium dose as he is about out.  Note his potassium level is 4.6 at the last cardiology visit and he is on Aldactone.  04/06/19 Since the last visit which was a telephone visit this is a face-to-face visit.  The patient is improved with less shortness of breath no edema no chest pain.  He has been maintaining his medications appropriately.  Note his blood pressure today was 120/80 on arrival.  He has increased his  furosemide to 40 mg daily There is no other change in his medication at this time except he has been started on the BiDil  06/06/2019 Patient is seen in follow-up for chronic systolic heart failure due to hypertension.  He recently saw cardiology and had his Coreg dose increased to 6.25 mg twice daily he is also on a higher dose of Entresto maintains Aldactone potassium supplement and BiDil he is only taking furosemide as needed at this time. Note he went to the emergency room on 19 January for an abscess behind the left ear and had it incised and drained and is finishing a course of doxycycline  The patient does relate today that he has significant amounts of stress and anxiety at this time  The patient's not drinking alcohol any longer and does not use tobacco products  Recent echocardiogram was repeated and showed ejection fraction up to 55 to 60% which is markedly improved from prior values  Blood pressure today is at 145/85 and is improved control     Past Medical History:  Diagnosis Date  . Acute CHF (congestive heart failure) ([place]) 02/24/2019  . Anxiety   . Hypertension   . Peritonsillar abscess      Family History  Problem Relation Age of Onset  . Hyperlipidemia Mother   . Diabetes Mother   . Stroke Mother   . Hypertension Mother   . Hypertension Father      Social History   Socioeconomic History  . Marital status: Single    Spouse name: Not on file  . Number of children: Not on file  . Years of education: Not on file  . Highest education level: Not on file  Occupational  History  . Not on file  Tobacco Use  . Smoking status: Former Research scientist (life sciences)  . Smokeless tobacco: Never Used  . Tobacco comment: quit in January  Substance and Sexual Activity  . Alcohol use: No  . Drug use: No  . Sexual activity: Not on file  Other Topics Concern  . Not on file  Social History Narrative  . Not on file   Social Determinants of Health   Financial Resource Strain:   .  Difficulty of Paying Living Expenses: Not on file  Food Insecurity:   . Worried About Charity fundraiser in the Last Year: Not on file  . Ran Out of Food in the Last Year: Not on file  Transportation Needs:   . Lack of Transportation (Medical): Not on file  . Lack of Transportation (Non-Medical): Not on file  Physical Activity:   . Days of Exercise per Week: Not on file  . Minutes of Exercise per Session: Not on file  Stress:   . Feeling of Stress : Not on file  Social Connections:   . Frequency of Communication with Friends and Family: Not on file  . Frequency of Social Gatherings with Friends and Family: Not on file  . Attends Religious Services: Not on file  . Active Member of Clubs or Organizations: Not on file  . Attends Archivist Meetings: Not on file  . Marital Status: Not on file  Intimate Partner Violence:   . Fear of Current or Ex-Partner: Not on file  . Emotionally Abused: Not on file  . Physically Abused: Not on file  . Sexually Abused: Not on file     No Known Allergies   Outpatient Medications Prior to Visit  Medication Sig Dispense Refill  . carvedilol (COREG) 6.25 MG tablet Take 1 tablet (6.25 mg total) by mouth 2 (two) times daily with a meal. 60 tablet 5  . doxycycline (VIBRAMYCIN) 100 MG capsule Take 1 capsule (100 mg total) by mouth 2 (two) times daily. 20 capsule 0  . potassium chloride SA (KLOR-CON) 20 MEQ tablet TAKE 1 TABLET (20 MEQ TOTAL) BY MOUTH DAILY. 30 tablet 1  . sacubitril-valsartan (ENTRESTO) 97-103 MG Take 1 tablet by mouth 2 (two) times daily. 180 tablet 3  . isosorbide-hydrALAZINE (BIDIL) 20-37.5 MG tablet Take 2 tablets by mouth 3 (three) times daily. 540 tablet 3  . spironolactone (ALDACTONE) 25 MG tablet Take 1 tablet (25 mg total) by mouth daily. 30 tablet 3  . furosemide (LASIX) 40 MG tablet Take 1 tablet (40 mg total) by mouth daily as needed for fluid (and shortness of breath). (Patient not taking: Reported on 06/06/2019) 30  tablet 5   No facility-administered medications prior to visit.      Review of Systems Constitutional:   No  weight loss, night sweats,  Fevers, chills, fatigue, lassitude. HEENT:   No headaches,  Difficulty swallowing,  Tooth/dental problems,  Sore throat,                No sneezing, itching, ear ache, nasal congestion, post nasal drip,   CV:  No chest pain,  Orthopnea, PND, swelling in lower extremities, anasarca, dizziness, palpitations  GI  No heartburn, indigestion, abdominal pain, nausea, vomiting, diarrhea, change in bowel habits, loss of appetite  Resp: No shortness of breath with exertion or at rest.  No excess mucus, no productive cough,  No non-productive cough,  No coughing up of blood.  No change in color of mucus.  No  wheezing.  No chest wall deformity  Skin: no rash or lesions.  GU: no dysuria, change in color of urine, no urgency or frequency.  No flank pain.  MS:  No joint pain or swelling.  No decreased range of motion.  No back pain.  Psych:   change in mood or affect.  depression or anxiety.  No memory loss.     Objective:   Physical Exam Vitals:   06/06/19 0925  BP: (!) 145/85  Pulse: 60  Resp: 16  Temp: 98.3 F (36.8 C)  TempSrc: Oral  SpO2: 99%  Weight: 235 lb 3.2 oz (106.7 kg)    Gen: Pleasant, well-nourished, in no distress,  normal affect  ENT: No lesions,  mouth clear,  oropharynx clear, no postnasal drip  Neck: No JVD, no TMG, no carotid bruits  Lungs: No use of accessory muscles, no dullness to percussion, clear without rales or rhonchi  Cardiovascular: RRR, heart sounds normal, no murmur or gallops, no peripheral edema  Abdomen: soft and NT, no HSM,  BS normal  Musculoskeletal: No deformities, no cyanosis or clubbing  Neuro: alert, non focal  Skin: Warm, no lesions or rashes  No results found.  BMP Latest Ref Rng & Units 05/16/2019 04/24/2019 03/27/2019  Glucose 70 - 99 mg/dL 102(H) 99 94  BUN 6 - 20 mg/dL _0 Creatinine 0.61 - 1.24 mg/dL 1.55(H) 1.66(H) 1.67(H)  Sodium 135 - 145 mmol/L 139 138 137  Potassium 3.5 - 5.1 mmol/L 4.4 4.2 4.4  Chloride 98 - 111 mmol/L 107 105 105  CO2 22 - 32 mmol/L 23 21(L) 23  Calcium 8.9 - 10.3 mg/dL 8.9 8.9 9.0       Assessment & Plan:  I personally reviewed all images and lab data in the [facility] system as well as any outside material available during this office visit and agree with the  radiology impressions.   Chronic systolic heart failure (HCC)due to Hypertension Chronic systolic heart failure stable at this time Plan for this patient will be to continue BiDil at 20/37.5 at 2 tablets 3 times daily, continue Coreg at 6.25 mg twice daily and Entresto at 97/1031 twice daily and Aldactone 25 mg daily along with potassium supplement daily  I did refill the patient's BiDil at this visit along with Coreg at our clinic pharmacy he has adequate refills on all the other medications they are coming into our pharmacy  HTN (hypertension) Treatment for heart failure is adequate controlling blood pressure at this time and will maintain current program  GAD (generalized anxiety disorder) Generalized anxiety disorder with mood swings  Plan for this patient is to be referred to our licensed clinical social work for behavioral therapy I also offered a low-dose of an antidepressant he declined at this time he may need further mental health follow-up   Alexus was seen today for follow-up.  Diagnoses and all orders for this visit:  Chronic systolic heart failure (HCC)due to Hypertension  Essential hypertension  CKD (chronic kidney disease), stage II  BMI 36.0-36.9,adult  GAD (generalized anxiety disorder)  Other orders -     spironolactone (ALDACTONE) 25 MG tablet; Take 1 tablet (25 mg total) by mouth daily. -     Discontinue: isosorbide-hydrALAZINE (BIDIL) 20-37.5 MG tablet; Take 2 tablets by mouth 3 (three) times daily. -     isosorbide-hydrALAZINE (BIDIL)  20-37.5 MG tablet; Take 2 tablets by mouth 3 (three) times daily.

## 2019-06-06 NOTE — Assessment & Plan Note (Signed)
Chronic systolic heart failure stable at this time Plan for this patient will be to continue BiDil at 20/37.5 at 2 tablets 3 times daily, continue Coreg at 6.25 mg twice daily and Entresto at 97/1031 twice daily and Aldactone 25 mg daily along with potassium supplement daily  I did refill the patient's BiDil at this visit along with Coreg at our clinic pharmacy he has adequate refills on all the other medications they are coming into our pharmacy

## 2019-06-06 NOTE — Assessment & Plan Note (Signed)
Generalized anxiety disorder with mood swings  Plan for this patient is to be referred to our licensed clinical social work for behavioral therapy I also offered a low-dose of an antidepressant he declined at this time he may need further mental health follow-up

## 2019-06-06 NOTE — Patient Instructions (Signed)
An appointment with our licensed clinical social worker Jenel Lucks we made for your history of mood changes and anxiety  No change in your heart medicines and made refills to our pharmacy  Return to see Dr. Delford Field in 4 months

## 2019-06-23 MED FILL — POTASSIUM CL ER 20 MEQ TAB: 20 | 30 days supply | Qty: 30 | Fill #1

## 2019-06-23 MED FILL — SPIRONOLACTONE 25 MG TABLET: 25 | 30 days supply | Qty: 30 | Fill #3

## 2019-06-23 MED FILL — CARVEDILOL 6.25 MG TABLET: 6.25 | 30 days supply | Qty: 60 | Fill #1

## 2019-07-18 ENCOUNTER — Ambulatory Visit (HOSPITAL_COMMUNITY)
Admission: RE | Admit: 2019-07-18 | Discharge: 2019-07-18 | Disposition: A | Discharge status: Home / Self Care | Payer: Self-pay | Source: Ambulatory Visit | Attending: Cardiology | Admitting: Cardiology

## 2019-07-18 ENCOUNTER — Encounter (HOSPITAL_COMMUNITY): Payer: Self-pay | Admitting: Cardiology

## 2019-07-18 ENCOUNTER — Other Ambulatory Visit: Payer: Self-pay

## 2019-07-18 VITALS — BP 120/84 | HR 70 | Wt 232.0 lb

## 2019-07-18 DIAGNOSIS — I110 Hypertensive heart disease with heart failure: Secondary | ICD-10-CM | POA: Insufficient documentation

## 2019-07-18 DIAGNOSIS — Z8249 Family history of ischemic heart disease and other diseases of the circulatory system: Secondary | ICD-10-CM | POA: Insufficient documentation

## 2019-07-18 DIAGNOSIS — I428 Other cardiomyopathies: Secondary | ICD-10-CM | POA: Insufficient documentation

## 2019-07-18 DIAGNOSIS — Z7901 Long term (current) use of anticoagulants: Secondary | ICD-10-CM | POA: Insufficient documentation

## 2019-07-18 DIAGNOSIS — Z79899 Other long term (current) drug therapy: Secondary | ICD-10-CM | POA: Insufficient documentation

## 2019-07-18 DIAGNOSIS — Z87891 Personal history of nicotine dependence: Secondary | ICD-10-CM | POA: Insufficient documentation

## 2019-07-18 DIAGNOSIS — I5022 Chronic systolic (congestive) heart failure: Secondary | ICD-10-CM | POA: Insufficient documentation

## 2019-07-18 LAB — BASIC METABOLIC PANEL
Anion gap: 8 (ref 5–15)
BUN: 14 mg/dL (ref 6–20)
CO2: 23 mmol/L (ref 22–32)
Calcium: 9 mg/dL (ref 8.9–10.3)
Chloride: 107 mmol/L (ref 98–111)
Creatinine, Ser: 1.51 mg/dL — ABNORMAL HIGH (ref 0.61–1.24)
GFR calc Af Amer: >60 mL/min (ref >60)
GFR calc non Af Amer: 59 mL/min — ABNORMAL LOW (ref >60)
Glucose, Bld: 108 mg/dL — ABNORMAL HIGH (ref 70–99)
Potassium: 4 mmol/L (ref 3.5–5.1)
Sodium: 138 mmol/L (ref 135–145)

## 2019-07-18 NOTE — Progress Notes (Signed)
Advanced Heart Failure Clinic Note   Referring Physician: PCP: Elsie Stain, MD Cardiologist: Dr. Aundra Dubin  34 y.o. with PMH of untreated HTN who was admitted to [facility] 02/24/19 w/ acute CHF. He presented with acute dyspnea and was found to be markedly hypertensive and in acute pulmonary edema. He was started on IV diuretics and antihypertensives. 2D echo showed severely reduced LVEF 25-30% with mild LV dilation and mild LV hypertrophy, mildly decreased RV systolic function. LHC/RHC showed normal filling pressures and cardiac output after diuresis, no coronary disease on angiography. No h/o drugs/ETOH/smoking. Mother with "heart disease" but he is not clear on the diagnosis.   Primary etiology for his HF was felt to be poorly controlled HTN. After he was diuresed back to euvolemic state, he was transitioned to PO lasix. Also treated w/ Entresto, Spironolactone and Coreg. Discharge weight was 231 lb.   Echo in 1/21 showed that EF is significantly improved, up to 55-60%, mild LVH, normal RV.   He returns for followup of CHF.  He is walking daily for exercise with no chest pain or exertional dyspnea.  No orthopnea/PND. Tolerating meds with no lightheadedness.  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    PMH: 1. HTN 2. Chronic systolic CHF: Nonischemic cardiomyopathy.  - Echo (10/20): EF 25-30%, mildly decreased RV systolic function.  - LHC/RHC (10/20): No significant CAD.  Mean RA 3, PA 32/10, mean PCWP 12, CI 2.69.  - Echo (1/21): EF 55-60%, mild LVH, normal RV.   Review of Systems: All systems reviewed and negative except as per HPI.   Current Outpatient Medications  Medication Sig Dispense Refill  . carvedilol (COREG) 6.25 MG tablet Take 1 tablet (6.25 mg total) by mouth 2 (two) times daily with a meal. 60 tablet 5  . furosemide (LASIX) 40 MG tablet Take 1 tablet (40 mg total) by mouth daily as needed for fluid (and shortness of breath). 30 tablet 5  . isosorbide-hydrALAZINE  (BIDIL) 20-37.5 MG tablet Take 2 tablets by mouth 3 (three) times daily. 540 tablet 3  . potassium chloride SA (KLOR-CON) 20 MEQ tablet TAKE 1 TABLET (20 MEQ TOTAL) BY MOUTH DAILY. 30 tablet 1  . sacubitril-valsartan (ENTRESTO) 97-103 MG Take 1 tablet by mouth 2 (two) times daily. 180 tablet 3  . spironolactone (ALDACTONE) 25 MG tablet Take 1 tablet (25 mg total) by mouth daily. 30 tablet 3   No current facility-administered medications for this encounter.    No Known Allergies    Social History   Socioeconomic History  . Marital status: Single    Spouse name: Not on file  . Number of children: Not on file  . Years of education: Not on file  . Highest education level: Not on file  Occupational History  . Not on file  Tobacco Use  . Smoking status: Former Research scientist (life sciences)  . Smokeless tobacco: Never Used  . Tobacco comment: quit in January  Substance and Sexual Activity  . Alcohol use: No  . Drug use: No  . Sexual activity: Not on file  Other Topics Concern  . Not on file  Social History Narrative  . Not on file   Social Determinants of Health   Financial Resource Strain:   . Difficulty of Paying Living Expenses: Not on file  Food Insecurity:   . Worried About Charity fundraiser in the Last Year: Not on file  . Ran Out of Food in the Last Year: Not on file  Transportation Needs:   .  Lack of Transportation (Medical): Not on file  . Lack of Transportation (Non-Medical): Not on file  Physical Activity:   . Days of Exercise per Week: Not on file  . Minutes of Exercise per Session: Not on file  Stress:   . Feeling of Stress : Not on file  Social Connections:   . Frequency of Communication with Friends and Family: Not on file  . Frequency of Social Gatherings with Friends and Family: Not on file  . Attends Religious Services: Not on file  . Active Member of Clubs or Organizations: Not on file  . Attends Banker Meetings: Not on file  . Marital Status: Not on file    Intimate Partner Violence:   . Fear of Current or Ex-Partner: Not on file  . Emotionally Abused: Not on file  . Physically Abused: Not on file  . Sexually Abused: Not on file      Family History  Problem Relation Age of Onset  . Hyperlipidemia Mother   . Diabetes Mother   . Stroke Mother   . Hypertension Mother   . Hypertension Father     Vitals:   07/18/19 0903  BP: 120/84  Pulse: 70  SpO2: 98%  Weight: 105.2 kg (232 lb)   PHYSICAL EXAM: General: NAD Neck: No JVD, no thyromegaly or thyroid nodule.  Lungs: Clear to auscultation bilaterally with normal respiratory effort. CV: Nondisplaced PMI.  Heart regular S1/S2, no S3/S4, no murmur.  No peripheral edema.  No carotid bruit.  Normal pedal pulses.  Abdomen: Soft, nontender, no hepatosplenomegaly, no distention.  Skin: Intact without lesions or rashes.  Neurologic: Alert and oriented x 3.  Psych: Normal affect. Extremities: No clubbing or cyanosis.  HEENT: Normal.   ASSESSMENT & PLAN:  1. Chronic systolic CHF: Nonischemic cardiomyopathy.  Echo 10/20 w/ reduced LVEF 25-30% with mild LV dilation and mild LV hypertrophy, mildly decreased RV systolic function. Cause of cardiomyopathy may be long-standing uncontrolled HTN versus viral myocarditis. LHC/RHC showed normal filling pressures and cardiac output after diuresis, no coronary disease on angiography. No drugs/ETOH/smoking. Mother with "heart disease" but he is not clear on the diagnosis.  Echo in 1/21 with significant improvement, EF up to 55-60% with medical treatment. NYHA class I symptoms, euvolemic.  - We discussed meds, I think that he should continue the current regimen for now.  He is tolerating the meds and BP is controlled.  - Continue Entresto 97/103 bid - No Lasix needed.   - Continue spironolactone 25 mg daily.  BMET today.  - Continue Coreg 6.25 mg bid.   - Continue Bidili 2 tablets tid  2. HTN: BP controlled on current regimen.   Followup with me in 6  months.  Will need BMET in 3 months.    Marca Ancona, MD 07/18/19

## 2019-07-18 NOTE — Patient Instructions (Signed)
Labs today and repeat in 3 months We will only contact you if something comes back abnormal or we need to make some changes. Otherwise no news is good news!  No changes today!  Your physician recommends that you schedule a follow-up appointment in: 6 months. We will call you to schedule this appointment.   Please call office at [phone] option 2 if you have any questions or concerns.    At the Advanced Heart Failure Clinic, you and your health needs are our priority. As part of our continuing mission to provide you with exceptional heart care, we have created designated Provider Care Teams. These Care Teams include your primary Cardiologist (physician) and Advanced Practice Providers (APPs- Physician Assistants and Nurse Practitioners) who all work together to provide you with the care you need, when you need it.   You may see any of the following providers on your designated Care Team at your next follow up: Marland Kitchen Dr Arvilla Meres . Dr Marca Ancona . Tonye Becket, NP . Robbie Lis, PA . Karle Plumber, PharmD   Please be sure to bring in all your medications bottles to every appointment.

## 2019-07-27 ENCOUNTER — Other Ambulatory Visit: Payer: Self-pay | Admitting: Family Medicine

## 2019-07-27 MED FILL — FUROSEMIDE 40 MG TAB: 40 | 30 days supply | Qty: 30 | Fill #0

## 2019-07-27 MED FILL — CARVEDILOL 6.25 MG TABLET: 6.25 | 30 days supply | Qty: 60 | Fill #2

## 2019-07-27 MED FILL — SPIRONOLACTONE 25 MG TABLET: 25 | 30 days supply | Qty: 30 | Fill #0

## 2019-07-28 MED FILL — POTASSIUM CL ER 20 MEQ TABL: 20 | 30 days supply | Qty: 30 | Fill #0

## 2019-08-01 MED FILL — BIDIL 20-37.5 MG TABS: 20-37.5 | 30 days supply | Qty: 90 | Fill #0

## 2019-09-04 MED FILL — SPIRONOLACTONE 25 MG TABLET: 25 | 30 days supply | Qty: 30 | Fill #1

## 2019-09-04 MED FILL — FUROSEMIDE 40 MG TAB: 40 | 30 days supply | Qty: 30 | Fill #1

## 2019-09-04 MED FILL — CARVEDILOL 6.25 MG TABLET: 6.25 | 30 days supply | Qty: 60 | Fill #3

## 2019-09-27 ENCOUNTER — Other Ambulatory Visit: Payer: Self-pay | Admitting: Critical Care Medicine

## 2019-09-27 ENCOUNTER — Ambulatory Visit: Payer: Self-pay | Attending: Critical Care Medicine | Admitting: Critical Care Medicine

## 2019-09-27 ENCOUNTER — Encounter: Payer: Self-pay | Admitting: Critical Care Medicine

## 2019-09-27 ENCOUNTER — Other Ambulatory Visit: Payer: Self-pay

## 2019-09-27 DIAGNOSIS — F411 Generalized anxiety disorder: Secondary | ICD-10-CM

## 2019-09-27 DIAGNOSIS — N182 Chronic kidney disease, stage 2 (mild): Secondary | ICD-10-CM

## 2019-09-27 DIAGNOSIS — I5022 Chronic systolic (congestive) heart failure: Secondary | ICD-10-CM

## 2019-09-27 DIAGNOSIS — I10 Essential (primary) hypertension: Secondary | ICD-10-CM

## 2019-09-27 MED ORDER — BIDIL 20-37.5 MG PO TABS: 2.0000 | ORAL_TABLET | Freq: Three times a day (TID) | ORAL | 3 refills | Status: DC

## 2019-09-27 MED ORDER — CARVEDILOL 6.25 MG PO TABS: 6.2500 mg | ORAL_TABLET | Freq: Two times a day (BID) | ORAL | 5 refills | Status: DC

## 2019-09-27 MED ORDER — SPIRONOLACTONE 25 MG PO TABS: 25.0000 mg | ORAL_TABLET | Freq: Every day | ORAL | 3 refills | Status: DC

## 2019-09-27 MED ORDER — SACUBITRIL-VALSARTAN 97-103 MG PO TABS: 1.0000 | ORAL_TABLET | Freq: Two times a day (BID) | ORAL | 3 refills | Status: DC

## 2019-09-27 MED ORDER — FUROSEMIDE 40 MG PO TABS: 40.0000 mg | ORAL_TABLET | Freq: Every day | ORAL | 5 refills | Status: DC | PRN

## 2019-09-27 MED ORDER — POTASSIUM CHLORIDE CRYS ER 20 MEQ PO TBCR: 20.0000 meq | EXTENDED_RELEASE_TABLET | Freq: Every day | ORAL | 1 refills | Status: DC

## 2019-09-27 MED FILL — CARVEDILOL 6.25 MG TABLET: 6.25 | 30 days supply | Qty: 60 | Fill #0

## 2019-09-27 MED FILL — SPIRONOLACTONE 25 MG TABLET: 25 | 30 days supply | Qty: 30 | Fill #0

## 2019-09-27 MED FILL — ENTRESTO 97 MG-103 MG TAB: 97-103 | 30 days supply | Qty: 60 | Fill #0

## 2019-09-27 MED FILL — POTASSIUM CL ER 20 MEQ TABL: 20 | 30 days supply | Qty: 30 | Fill #0

## 2019-09-27 NOTE — Progress Notes (Signed)
Subjective:    Patient ID: Derek Reed, male    DOB: [birth year], 34 y.o.   MRN: [id number]  33 y.o.M with telemedicine phone visit for CHF f/u and to establish for Primary care . This patient was admitted in October for acute pulmonary edema and hypertensive cardiomyopathy discharge summary is as below  Admit date:02/23/2019 Discharge date:02/26/2019  Time spent:65mnutes  Recommendations for Outpatient Follow-up: 1. [facility], please check be met at follow-up   Discharge Diagnoses: Principal Problem: Acute systolic CHF Hypertensive cardiomyopathy Borderline diabetes Hypokalemia HTN (hypertension) Elevated troponin CKD (chronic kidney disease), stage II Anxiety Acute systolic [place]) [place]   Discharge Condition:Stable  Diet recommendation:Carb modified, heart healthy      Filed Weights  02/24/19 0528 02/25/19 0616 02/26/19 0554 Weight: 105.1 kg 108.8 kg 105.1 kg   History of present illness: Derek Reed a 33y.o.malewith medical history significant ofhypertension not taking medications, CKD-2, peritonsillar abscess, anxiety, who presented with shortness of breath, for more than 2 weeks. He has dry cough. - COVID-19 test was negative, EKG noted LVH Chest x-ray showed increased cardiac silhouette and interstitial edema   Hospital Course:  Acute CHF/new onset -Hypertensive cardiomyopathy, has LVH on EKG -Clinically improved with diuresis, he was -5 L at discharge -2D echo with EF of 25 to 30%,cardiology consultunderwent left heart cath 10/16which showed minimal nonobstructive disease, consistent with nonischemic cardiomyopathy -Transition to oral Lasix, Entresto, Coreg and Aldactone at discharge -Seen by case management for patient assistance, he was given a mSystems developerand EDelene Lollcard -Educated regarding diet and lifestyle modification  Elevated  troponin: -Troponin156 -->153. 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  Since discharge the patient has been in the advanced heart failure clinic for follow-up as below OP CV f/u 10/27: 34y/o [name] with PMH of untreated HTN [facility]. He presented with acute dyspnea and was found to be markedly hypertensive and in acute pulmonary edema. He was started on IV diuretics and antihypertensives. 2D echo showed severely reduced LVEF25-30% with mild LV dilation and mild LV hypertrophy, mildly decreased RV systolic function. LHC/RHC showed normal filling pressures and cardiac output after diuresis, no coronary disease on angiography. Noh/odrugs/ETOH/smoking. Mother with "heart disease" but he is not clear on the diagnosis.   Primary etiology for his HF was felt to be poorly controlled HTN. After he was diuresed back to euvolemic state, he was transitioned to PO lasix. Also treated w/ Entresto, Spironolactone and Coreg. Discharge weight was 231 lb.   He presents to clinic today for post hospital f/u. Doing well. Denies exertional dyspnea. No weight gain or LEE. Compliant w/ meds. Tolerating well. No side effects. Has modified diet. Trying to eat healthier and reading food labels. Trying to keep Na intake under 1500 mg/day. Office weight 233 lb today. BP 122/80.  1.  Systolic Heart Failure: Recent Echo 10/20 w/ reduced LVEF 25-30% with mild LV dilation and mild LV hypertrophy, mildly decreased RV systolic function.  Cause of cardiomyopathy may  be long-standing uncontrolled HTN. LHC/RHC showed normal filling pressures and cardiac output after diuresis, no coronary disease on angiography. No drugs/ETOH/smoking. Mother with "heart disease" but he is not clear on the diagnosis. -NYHA Class II, euvolemic on exam. Wt stable compared to recent hospital d/c wt -BP improved. 122/80 today.  -Check BMP today -ContinueEntresto to49-51bid - Reduce Lasix to 20 mg daily - Continue spironolactone25 mg daily.  - ContinueCoreg 3.125 mg bid.  -Add Bidil 1 tablet tid - we discussed importance of daily wts and low sodium diet  - f/u w/ PharmD in 3 weeks for further med titration. -will ultimately need repeat echo 3 months after med optimization  2.OAC:ZYSAYTK improved. 122/80 today. -HF meds per above.   At the last cardiology visit the patient was to continue Entresto twice daily current dose but to reduce Lasix to 20 mg daily and begin BiDil 1 tablet 3 times daily along with continuing Coreg and spironolactone.  The patient states he did not obtain the BiDil as he cannot afford it at his private pharmacy as he is self-pay status.  The patient states his breathing is better he is having no cough or chest pain he has no edema in the lower extremities.  He is taking all his medications as prescribed.  He had questions on what to do with the potassium dose as he is about out.  Note his potassium level is 4.6 at the last cardiology visit and he is on Aldactone.  04/06/19 Since the last visit which was a telephone visit this is a face-to-face visit.  The patient is improved with less shortness of breath no edema no chest pain.  He has been maintaining his medications appropriately.  Note his blood pressure today was 120/80 on arrival.  He has increased his  furosemide to 40 mg daily There is no other change in his medication at this time except he has been started on the BiDil  06/06/2019 Patient is seen in follow-up for chronic systolic heart failure due to hypertension.  He recently saw cardiology and had his Coreg dose increased to 6.25 mg twice daily he is also on a higher dose of Entresto maintains Aldactone potassium supplement and BiDil he is only taking furosemide as needed at this time. Note he went to the emergency room on 19 January for an abscess behind the left ear and had it incised and drained and is finishing a course of doxycycline  The patient does relate today that he has significant amounts of stress and anxiety at this time  The patient's not drinking alcohol any longer and does not use tobacco products  Recent echocardiogram was repeated and showed ejection fraction up to 55 to 60% which is markedly improved from prior values  Blood pressure today is at 145/85 and is improved control   09/27/2019 The patient was seen in return follow-up for chronic systolic heart failure and hypertension.  The patient's been compliant with all of his medications.  He denies chest pain or shortness of breath.  He is now off diuretics.  He is following up with the heart failure clinic and maintaining all medications.  Currently the patient maintains Coreg 6.25 mg twice daily Entresto 97/1031 twice daily Aldactone 25 mg daily and BiDil at 20/37 2 tablets 3 times daily he also continues with potassium supplementation  The patient started a job working at [employer] to [employer] with this he continue with patient assistance with Delene Loll  Past Medical History:  Diagnosis Date  .  Acute CHF (congestive heart failure) ([place]) 02/24/2019  . Anxiety   . Hypertension   . Peritonsillar abscess      Family History  Problem Relation Age of Onset  . Hyperlipidemia Mother   . Diabetes Mother   . Stroke Mother   . Hypertension Mother   .  Hypertension Father      Social History   Socioeconomic History  . Marital status: Single    Spouse name: Not on file  . Number of children: Not on file  . Years of education: Not on file  . Highest education level: Not on file  Occupational History  . Not on file  Tobacco Use  . Smoking status: Former Research scientist (life sciences)  . Smokeless tobacco: Never Used  . Tobacco comment: quit in January  Substance and Sexual Activity  . Alcohol use: No  . Drug use: No  . Sexual activity: Not on file  Other Topics Concern  . Not on file  Social History Narrative  . Not on file   Social Determinants of Health   Financial Resource Strain:   . Difficulty of Paying Living Expenses:   Food Insecurity:   . Worried About Charity fundraiser in the Last Year:   . Arboriculturist in the Last Year:   Transportation Needs:   . Film/video editor (Medical):   Marland Kitchen Lack of Transportation (Non-Medical):   Physical Activity:   . Days of Exercise per Week:   . Minutes of Exercise per Session:   Stress:   . Feeling of Stress :   Social Connections:   . Frequency of Communication with Friends and Family:   . Frequency of Social Gatherings with Friends and Family:   . Attends Religious Services:   . Active Member of Clubs or Organizations:   . Attends Archivist Meetings:   Marland Kitchen Marital Status:   Intimate Partner Violence:   . Fear of Current or Ex-Partner:   . Emotionally Abused:   Marland Kitchen Physically Abused:   . Sexually Abused:      No Known Allergies   Outpatient Medications Prior to Visit  Medication Sig Dispense Refill  . carvedilol (COREG) 6.25 MG tablet Take 1 tablet (6.25 mg total) by mouth 2 (two) times daily with a meal. 60 tablet 5  . furosemide (LASIX) 40 MG tablet Take 1 tablet (40 mg total) by mouth daily as needed for fluid (and shortness of breath). 30 tablet 5  . isosorbide-hydrALAZINE (BIDIL) 20-37.5 MG tablet Take 2 tablets by mouth 3 (three) times daily. 540 tablet 3  . potassium  chloride SA (KLOR-CON) 20 MEQ tablet TAKE 1 TABLET (20 MEQ TOTAL) BY MOUTH DAILY. 30 tablet 1  . sacubitril-valsartan (ENTRESTO) 97-103 MG Take 1 tablet by mouth 2 (two) times daily. 180 tablet 3  . spironolactone (ALDACTONE) 25 MG tablet Take 1 tablet (25 mg total) by mouth daily. 30 tablet 3   No facility-administered medications prior to visit.      Review of Systems Constitutional:   No  weight loss, night sweats,  Fevers, chills, fatigue, lassitude. HEENT:   No headaches,  Difficulty swallowing,  Tooth/dental problems,  Sore throat,                No sneezing, itching, ear ache, nasal congestion, post nasal drip,   CV:  No chest pain,  Orthopnea, PND, swelling in lower extremities, anasarca, dizziness, palpitations  GI  No heartburn, indigestion, abdominal pain, nausea, vomiting, diarrhea, change in  bowel habits, loss of appetite  Resp: No shortness of breath with exertion or at rest.  No excess mucus, no productive cough,  No non-productive cough,  No coughing up of blood.  No change in color of mucus.  No wheezing.  No chest wall deformity  Skin: no rash or lesions.  GU: no dysuria, change in color of urine, no urgency or frequency.  No flank pain.  MS:  No joint pain or swelling.  No decreased range of motion.  No back pain.  Psych:   change in mood or affect.  depression or anxiety.  No memory loss.     Objective:   Physical Exam Vitals:   09/27/19 0942  BP: 135/72  Pulse: 79  Resp: 16  Temp: 97.9 F (36.6 C)  SpO2: 100%  Weight: 247 lb (112 kg)  Height: _0  (1.676 m)    Gen: Pleasant, well-nourished, in no distress,  normal affect  ENT: No lesions,  mouth clear,  oropharynx clear, no postnasal drip  Neck: No JVD, no TMG, no carotid bruits  Lungs: No use of accessory muscles, no dullness to percussion, clear without rales or rhonchi  Cardiovascular: RRR, heart sounds normal, no murmur or gallops, no peripheral edema  Abdomen: soft and NT, no HSM,  BS  normal  Musculoskeletal: No deformities, no cyanosis or clubbing  Neuro: alert, non focal  Skin: Warm, no lesions or rashes  No results found.  BMP Latest Ref Rng & Units 07/18/2019 05/16/2019 04/24/2019  Glucose 70 - 99 mg/dL 108(H) 102(H) 99  BUN 6 - 20 mg/dL _1 Creatinine 0.61 - 1.24 mg/dL 1.51(H) 1.55(H) 1.66(H)  Sodium 135 - 145 mmol/L 138 139 138  Potassium 3.5 - 5.1 mmol/L 4.0 4.4 4.2  Chloride 98 - 111 mmol/L 107 107 105  CO2 22 - 32 mmol/L 23 23 21(L)  Calcium 8.9 - 10.3 mg/dL 9.0 8.9 8.9       Assessment & Plan:  I personally reviewed all images and lab data in the [facility] system as well as any outside material available during this office visit and agree with the  radiology impressions.   Chronic systolic heart failure (HCC)due to Hypertension Chronic systolic heart failure secondary to hypertension improved at this time  No change in medications refills were given  HTN (hypertension) Hypertension and improved control  CKD (chronic kidney disease), stage II Chronic kidney disease stable at this time potassium stable  GAD (generalized anxiety disorder) History of generalized anxiety disorder stable without medications at this time   Diagnoses and all orders for this visit:  Chronic systolic heart failure (HCC)due to Hypertension  Essential hypertension  CKD (chronic kidney disease), stage II  GAD (generalized anxiety disorder)  Other orders -     carvedilol (COREG) 6.25 MG tablet; Take 1 tablet (6.25 mg total) by mouth 2 (two) times daily with a meal. -     furosemide (LASIX) 40 MG tablet; Take 1 tablet (40 mg total) by mouth daily as needed for fluid (and shortness of breath). -     isosorbide-hydrALAZINE (BIDIL) 20-37.5 MG tablet; Take 2 tablets by mouth 3 (three) times daily. -     potassium chloride SA (KLOR-CON) 20 MEQ tablet; Take 1 tablet (20 mEq total) by mouth daily. -     sacubitril-valsartan (ENTRESTO) 97-103 MG; Take 1 tablet by mouth 2  (two) times daily. -     spironolactone (ALDACTONE) 25 MG tablet; Take 1 tablet (25 mg total) by  mouth daily.

## 2019-09-27 NOTE — Assessment & Plan Note (Signed)
Hypertension and improved control

## 2019-09-27 NOTE — Assessment & Plan Note (Signed)
Chronic systolic heart failure secondary to hypertension improved at this time  No change in medications refills were given

## 2019-09-27 NOTE — Progress Notes (Signed)
HTN F /U NEED MEDS REFILLS

## 2019-09-27 NOTE — Patient Instructions (Signed)
No change in medications  All medications refilled  Return 4 months  It is safe to fly  Please get a Covid vaccine  COVID-19 Vaccine Information can be found at: [email or web address] For questions related to vaccine distribution or appointments, please email [email or web address] or call [phone].

## 2019-09-27 NOTE — Assessment & Plan Note (Signed)
Chronic kidney disease stable at this time potassium stable

## 2019-09-27 NOTE — Assessment & Plan Note (Signed)
History of generalized anxiety disorder stable without medications at this time

## 2019-10-04 ENCOUNTER — Ambulatory Visit: Payer: Self-pay | Admitting: Critical Care Medicine

## 2019-10-12 ENCOUNTER — Encounter (HOSPITAL_BASED_OUTPATIENT_CLINIC_OR_DEPARTMENT_OTHER): Payer: Self-pay | Admitting: *Deleted

## 2019-10-12 ENCOUNTER — Other Ambulatory Visit: Payer: Self-pay

## 2019-10-12 ENCOUNTER — Emergency Department (HOSPITAL_BASED_OUTPATIENT_CLINIC_OR_DEPARTMENT_OTHER)
Admission: EM | Admit: 2019-10-12 | Discharge: 2019-10-13 | Disposition: A | Discharge status: Home / Self Care | Payer: BC Managed Care – PPO | Attending: Emergency Medicine | Admitting: Emergency Medicine

## 2019-10-12 DIAGNOSIS — I509 Heart failure, unspecified: Secondary | ICD-10-CM | POA: Insufficient documentation

## 2019-10-12 DIAGNOSIS — L0291 Cutaneous abscess, unspecified: Secondary | ICD-10-CM

## 2019-10-12 DIAGNOSIS — H6002 Abscess of left external ear: Secondary | ICD-10-CM | POA: Insufficient documentation

## 2019-10-12 DIAGNOSIS — I110 Hypertensive heart disease with heart failure: Secondary | ICD-10-CM | POA: Diagnosis not present

## 2019-10-12 MED ORDER — LIDOCAINE HCL (PF) 1 % IJ SOLN
10.0000 mL | Freq: Once | INTRAMUSCULAR | Status: AC
  Administered 2019-10-12: 10 mL
  Filled 2019-10-12: qty 10

## 2019-10-12 MED FILL — SPIRONOLACTONE 25 MG TABLET: 25 | 30 days supply | Qty: 30 | Fill #2

## 2019-10-12 MED FILL — CARVEDILOL 6.25 MG TABLET: 6.25 | 30 days supply | Qty: 60 | Fill #4

## 2019-10-12 MED FILL — FUROSEMIDE 40 MG TAB: 40 | 30 days supply | Qty: 30 | Fill #2

## 2019-10-12 NOTE — ED Provider Notes (Signed)
[facility] EMERGENCY DEPARTMENT Provider Note   CSN: [id number] Arrival date & time: 10/12/19  1836     History Chief Complaint  Patient presents with  . Abscess    Derek Reed is a 34 y.o. male.  Patient is a 34 year old male who presents with an abscess.  He has had a 2-day history of worsening swelling behind his left ear.  He says he had a similar abscess in the same location before that had to be I&D.  He denies any fevers.  No drainage from the site.        Past Medical History:  Diagnosis Date  . Acute CHF (congestive heart failure) ([place]) 02/24/2019  . Anxiety   . Hypertension   . Peritonsillar abscess     Patient Active Problem List   Diagnosis Date Noted  . HTN (hypertension) 02/24/2019  . 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 GAD (generalized anxiety disorder) 03/26/2014  . BMI 36.0-36.9,adult 03/26/2014    Past Surgical History:  Procedure Laterality Date  . INCISION AND DRAINAGE OF PERITONSILLAR ABCESS    . RIGHT/LEFT HEART CATH AND CORONARY ANGIOGRAPHY N/A 02/24/2019   Procedure: RIGHT/LEFT HEART CATH AND CORONARY ANGIOGRAPHY;  Surgeon: Larey Dresser, MD;  Location: [facility];  Service: Cardiovascular;  Laterality: N/A;       Family History  Problem Relation Age of Onset  . Hyperlipidemia Mother   . Diabetes Mother   . Stroke Mother   . Hypertension Mother   . Hypertension Father     Social History   Tobacco Use  . Smoking status: Former Research scientist (life sciences)  . Smokeless tobacco: Never Used  . Tobacco comment: quit in January  Substance Use Topics  . Alcohol use: No  . Drug use: No    Home Medications Prior to Admission medications   Medication Sig Start Date End Date Taking? Authorizing Provider  carvedilol (COREG) 6.25 MG tablet Take 1 tablet (6.25 mg total) by mouth 2 (two) times daily with a meal. 09/27/19   Elsie Stain, MD  furosemide  (LASIX) 40 MG tablet Take 1 tablet (40 mg total) by mouth daily as needed for fluid (and shortness of breath). 09/27/19   Elsie Stain, MD  isosorbide-hydrALAZINE (BIDIL) 20-37.5 MG tablet Take 2 tablets by mouth 3 (three) times daily. 09/27/19   Elsie Stain, MD  potassium chloride SA (KLOR-CON) 20 MEQ tablet Take 1 tablet (20 mEq total) by mouth daily. 09/27/19   Elsie Stain, MD  sacubitril-valsartan (ENTRESTO) 97-103 MG Take 1 tablet by mouth 2 (two) times daily. 09/27/19   Elsie Stain, MD  spironolactone (ALDACTONE) 25 MG tablet Take 1 tablet (25 mg total) by mouth daily. 09/27/19   Elsie Stain, MD    Allergies    Patient has no known allergies.  Review of Systems   Review of Systems  Constitutional: Negative for fever.  Gastrointestinal: Negative for nausea and vomiting.  Musculoskeletal: Negative for arthralgias, back pain, joint swelling and neck pain.  Skin: Positive for wound.  Neurological: Negative for weakness, numbness and headaches.    Physical Exam Updated Vital Signs BP (!) 144/108 (BP Location: Right Arm)   Pulse 64   Temp 99.2 F (37.3 C) (Oral)   Resp 14   Ht 5\' 6"  (1.676 m)   Wt 108.9 kg   SpO2 100%   BMI 38.74 kg/m   Physical Exam Constitutional:  Appearance: He is well-developed.  HENT:     Head: Normocephalic and atraumatic.  Cardiovascular:     Rate and Rhythm: Normal rate.  Pulmonary:     Effort: Pulmonary effort is normal.  Musculoskeletal:        General: Tenderness present.     Cervical back: Normal range of motion and neck supple.     Comments: Patient has a 1 cm fluctuant abscess just posterior to left ear.  No surrounding cellulitis.  Skin:    General: Skin is warm and dry.  Neurological:     Mental Status: He is alert and oriented to person, place, and time.     ED Results / Procedures / Treatments   Labs (all labs ordered are listed, but only abnormal results are displayed) Labs Reviewed - No data to  display  EKG None  Radiology No results found.  Procedures .Marland KitchenIncision and Drainage  Date/Time: 10/12/2019 11:47 PM Performed by: Rolan Bucco, MD Authorized by: Rolan Bucco, MD   Consent:    Consent obtained:  Verbal   Consent given by:  Patient   Risks discussed:  Bleeding, incomplete drainage, infection and pain   Alternatives discussed:  No treatment Location:    Type:  Abscess   Size:  1cm   Location:  Head   Head location:  L external ear (Behind the ear) Pre-procedure details:    Skin preparation:  Betadine Anesthesia (see MAR for exact dosages):    Anesthesia method:  Local infiltration   Local anesthetic:  Lidocaine 1% w/o epi Procedure type:    Complexity:  Simple Procedure details:    Incision types:  Single straight   Incision depth:  Dermal   Scalpel blade:  11   Wound management:  Probed and deloculated   Drainage:  Purulent   Drainage amount:  Moderate   Wound treatment:  Wound left open   Packing materials:  None Post-procedure details:    Patient tolerance of procedure:  Tolerated well, no immediate complications   (including critical care time)  Medications Ordered in ED Medications  lidocaine (PF) (XYLOCAINE) 1 % injection 10 mL (10 mLs Infiltration Given by Other 10/12/19 2152)    ED Course  I have reviewed the triage vital signs and the nursing notes.  Pertinent labs & imaging results that were available during my care of the patient were reviewed by me and considered in my medical decision making (see chart for details).    MDM Rules/Calculators/A&P                      Patient with abscess behind his left ear.  It was opened by I&D.  There was large amount of purulent drainage.  I did not appreciate any surrounding cellulitis so he was not started on antibiotics.  Wound care instructions and return precautions were given. Final Clinical Impression(s) / ED Diagnoses Final diagnoses:  Abscess    Rx / DC Orders ED Discharge Orders     None       Rolan Bucco, MD 10/12/19 2349

## 2019-10-12 NOTE — ED Triage Notes (Signed)
Pt o abscess to left ear x 3 days

## 2019-10-18 ENCOUNTER — Other Ambulatory Visit (HOSPITAL_COMMUNITY): Payer: Self-pay

## 2019-10-24 ENCOUNTER — Other Ambulatory Visit (HOSPITAL_COMMUNITY): Payer: Self-pay

## 2019-10-30 ENCOUNTER — Ambulatory Visit (HOSPITAL_COMMUNITY)
Admission: RE | Admit: 2019-10-30 | Discharge: 2019-10-30 | Disposition: A | Discharge status: Home / Self Care | Payer: BC Managed Care – PPO | Source: Ambulatory Visit | Attending: Internal Medicine | Admitting: Internal Medicine

## 2019-10-30 ENCOUNTER — Other Ambulatory Visit: Payer: Self-pay

## 2019-10-30 DIAGNOSIS — I5022 Chronic systolic (congestive) heart failure: Secondary | ICD-10-CM | POA: Diagnosis not present

## 2019-10-30 LAB — BASIC METABOLIC PANEL
Anion gap: 7 (ref 5–15)
BUN: 13 mg/dL (ref 6–20)
CO2: 24 mmol/L (ref 22–32)
Calcium: 9 mg/dL (ref 8.9–10.3)
Chloride: 109 mmol/L (ref 98–111)
Creatinine, Ser: 1.54 mg/dL — ABNORMAL HIGH (ref 0.61–1.24)
GFR calc Af Amer: >60 mL/min (ref >60)
GFR calc non Af Amer: 57 mL/min — ABNORMAL LOW (ref >60)
Glucose, Bld: 97 mg/dL (ref 70–99)
Potassium: 4.2 mmol/L (ref 3.5–5.1)
Sodium: 140 mmol/L (ref 135–145)

## 2019-11-06 ENCOUNTER — Other Ambulatory Visit: Payer: Self-pay

## 2019-11-06 ENCOUNTER — Encounter (HOSPITAL_BASED_OUTPATIENT_CLINIC_OR_DEPARTMENT_OTHER): Payer: Self-pay | Admitting: Emergency Medicine

## 2019-11-06 ENCOUNTER — Emergency Department (HOSPITAL_BASED_OUTPATIENT_CLINIC_OR_DEPARTMENT_OTHER): Payer: BC Managed Care – PPO

## 2019-11-06 ENCOUNTER — Emergency Department (HOSPITAL_BASED_OUTPATIENT_CLINIC_OR_DEPARTMENT_OTHER)
Admission: EM | Admit: 2019-11-06 | Discharge: 2019-11-06 | Disposition: A | Discharge status: Home / Self Care | Payer: BC Managed Care – PPO | Attending: Emergency Medicine | Admitting: Emergency Medicine

## 2019-11-06 DIAGNOSIS — R05 Cough: Secondary | ICD-10-CM | POA: Diagnosis present

## 2019-11-06 DIAGNOSIS — Z87891 Personal history of nicotine dependence: Secondary | ICD-10-CM | POA: Diagnosis not present

## 2019-11-06 DIAGNOSIS — J069 Acute upper respiratory infection, unspecified: Secondary | ICD-10-CM | POA: Diagnosis not present

## 2019-11-06 DIAGNOSIS — B349 Viral infection, unspecified: Secondary | ICD-10-CM | POA: Diagnosis not present

## 2019-11-06 DIAGNOSIS — Z20822 Contact with and (suspected) exposure to covid-19: Secondary | ICD-10-CM | POA: Diagnosis not present

## 2019-11-06 DIAGNOSIS — I130 Hypertensive heart and chronic kidney disease with heart failure and stage 1 through stage 4 chronic kidney disease, or unspecified chronic kidney disease: Secondary | ICD-10-CM | POA: Insufficient documentation

## 2019-11-06 DIAGNOSIS — N182 Chronic kidney disease, stage 2 (mild): Secondary | ICD-10-CM | POA: Diagnosis not present

## 2019-11-06 DIAGNOSIS — R030 Elevated blood-pressure reading, without diagnosis of hypertension: Secondary | ICD-10-CM

## 2019-11-06 DIAGNOSIS — I509 Heart failure, unspecified: Secondary | ICD-10-CM | POA: Diagnosis not present

## 2019-11-06 DIAGNOSIS — R059 Cough, unspecified: Secondary | ICD-10-CM

## 2019-11-06 HISTORY — DX: Obesity, unspecified: E66.9

## 2019-11-06 LAB — SARS CORONAVIRUS 2 BY RT PCR (HOSPITAL ORDER, PERFORMED IN ~~LOC~~ HOSPITAL LAB): SARS Coronavirus 2: NEGATIVE

## 2019-11-06 NOTE — ED Notes (Signed)
AVS reviewed with client, work note also provided from Provider, Opportunity for questions provided as well

## 2019-11-06 NOTE — Discharge Instructions (Addendum)
At this time there does not appear to be the presence of an emergent medical condition, however there is always the potential for conditions to change. Please read and follow the below instructions.  Please return to the Emergency Department immediately for any new or worsening symptoms. Please be sure to follow up with your Primary Care Provider within one week regarding your visit today; please call their office to schedule an appointment even if you are feeling better for a follow-up visit. Your Covid test is pending.  The results will be available on your MyChart account in the next 1 day.  Check your MyChart account for results.  Follow-up with your primary care provider to discuss those results at your follow-up visit this week.  Continue to wear your mask and social distance.  There is a chance of a false negative test so I advise that you continue quarantining until you are symptom-free x7 days. Additionally your blood pressure was elevated in the ER today.  Have your blood pressure rechecked by your primary care doctor this week and discuss medication management with them at that time.  Get help right away if: You have shortness of breath that gets worse. You have very bad or constant: Headache. Ear pain. Pain in your forehead, behind your eyes, and over your cheekbones (sinus pain). Chest pain. You have long-lasting (chronic) lung disease along with any of these: Wheezing. Long-lasting cough. Coughing up blood. A change in your usual mucus. You have a stiff neck. You have changes in your: Vision. Hearing. Thinking. Mood. You have any new/concerning or worsening of symptoms  Please read the additional information packets attached to your discharge summary.  Do not take your medicine if  develop an itchy rash, swelling in your mouth or lips, or difficulty breathing; call 911 and seek immediate emergency medical attention if this occurs.  You may review your lab tests and  imaging results in their entirety on your MyChart account.  Please discuss all results of fully with your primary care provider and other specialist at your follow-up visit.  Note: Portions of this text may have been transcribed using voice recognition software. Every effort was made to ensure accuracy; however, inadvertent computerized transcription errors may still be present.

## 2019-11-06 NOTE — ED Notes (Signed)
States he has had a dry non-prod strong cough primarily in evening, denies any orthopnea or PND, denies any ankle swelling.

## 2019-11-06 NOTE — ED Provider Notes (Signed)
[facility] EMERGENCY DEPARTMENT Provider Note   CSN: [id number] Arrival date & time: 11/06/19  0825     History Chief Complaint  Patient presents with  . Cough    Derek Reed is a 34 y.o. male history of obesity, hypertension, CHF, CKD.  Patient presents today requesting Covid test.  Over the last 3 days he developed rhinorrhea and a mild nonproductive cough.  Symptoms were worse at night initially however starting yesterday he developed symptoms during the day as well.  He describes a mild nonproductive cough without associated chest pain or shortness of breath.  He describes rhinorrhea as clear.  He denies any other symptoms.  Denies fever/chills, headache, vision changes, sore throat, face swelling, chest pain, shortness of breath, hemoptysis, abdominal pain, nausea/vomiting, diarrhea, extremity swelling/color change, rash or any additional concerns.  Of note patient reports that he has young children at home who have allergies and similar symptoms.  Patient has not had the COVID-19 vaccine.  HPI     Past Medical History:  Diagnosis Date  . Acute CHF (congestive heart failure) (HCC) 02/24/2019  . Anxiety   . Hypertension   . Obesity   . Peritonsillar abscess     Patient Active Problem List   Diagnosis Date Noted  . HTN (hypertension) 02/24/2019  . 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 GAD (generalized anxiety disorder) 03/26/2014  . BMI 36.0-36.9,adult 03/26/2014    Past Surgical History:  Procedure Laterality Date  . INCISION AND DRAINAGE OF PERITONSILLAR ABCESS    . RIGHT/LEFT HEART CATH AND CORONARY ANGIOGRAPHY N/A 02/24/2019   Procedure: RIGHT/LEFT HEART CATH AND CORONARY ANGIOGRAPHY;  Surgeon: Laurey Morale, MD;  Location: [facility] INVASIVE CV LAB;  Service: Cardiovascular;  Laterality: N/A;       Family History  Problem Relation Age of Onset  . Hyperlipidemia Mother     . Diabetes Mother   . Stroke Mother   . Hypertension Mother   . Hypertension Father     Social History   Tobacco Use  . Smoking status: Former Games developer  . Smokeless tobacco: Never Used  . Tobacco comment: quit in January  Substance Use Topics  . Alcohol use: No  . Drug use: No    Home Medications Prior to Admission medications   Medication Sig Start Date End Date Taking? Authorizing Provider  carvedilol (COREG) 6.25 MG tablet Take 1 tablet (6.25 mg total) by mouth 2 (two) times daily with a meal. 09/27/19   Storm Frisk, MD  furosemide (LASIX) 40 MG tablet Take 1 tablet (40 mg total) by mouth daily as needed for fluid (and shortness of breath). 09/27/19   Storm Frisk, MD  isosorbide-hydrALAZINE (BIDIL) 20-37.5 MG tablet Take 2 tablets by mouth 3 (three) times daily. 09/27/19   Storm Frisk, MD  potassium chloride SA (KLOR-CON) 20 MEQ tablet Take 1 tablet (20 mEq total) by mouth daily. 09/27/19   Storm Frisk, MD  sacubitril-valsartan (ENTRESTO) 97-103 MG Take 1 tablet by mouth 2 (two) times daily. 09/27/19   Storm Frisk, MD  spironolactone (ALDACTONE) 25 MG tablet Take 1 tablet (25 mg total) by mouth daily. 09/27/19   Storm Frisk, MD    Allergies    Patient has no known allergies.  Review of Systems   Review of Systems Ten systems are reviewed and are negative for acute change except as noted in the HPI  Physical  Exam Updated Vital Signs BP (!) 150/100 (BP Location: Right Arm)   Pulse 77   Temp 98.6 F (37 C) (Oral)   Resp 16   Ht 5\' 6"  (1.676 m)   Wt 113.1 kg   SpO2 100%   BMI 40.24 kg/m   Physical Exam Constitutional:      General: He is not in acute distress.    Appearance: Normal appearance. He is well-developed. He is not ill-appearing or diaphoretic.  HENT:     Head: Normocephalic and atraumatic.     Jaw: There is normal jaw occlusion. No trismus.     Right Ear: Tympanic membrane and external ear normal.     Left Ear: Tympanic  membrane and external ear normal.     Nose: Rhinorrhea present. Rhinorrhea is clear.     Right Sinus: No maxillary sinus tenderness or frontal sinus tenderness.     Left Sinus: No maxillary sinus tenderness or frontal sinus tenderness.     Mouth/Throat:     Mouth: Mucous membranes are moist.     Pharynx: Oropharynx is clear.  Eyes:     General: Vision grossly intact. Gaze aligned appropriately.     Extraocular Movements: Extraocular movements intact.     Pupils: Pupils are equal, round, and reactive to light.  Neck:     Trachea: Trachea and phonation normal. No tracheal tenderness or tracheal deviation.  Cardiovascular:     Rate and Rhythm: Normal rate and regular rhythm.     Pulses:          Dorsalis pedis pulses are 2+ on the right side and 2+ on the left side.  Pulmonary:     Effort: Pulmonary effort is normal. No respiratory distress.     Breath sounds: Normal breath sounds and air entry.  Abdominal:     General: There is no distension.     Palpations: Abdomen is soft.     Tenderness: There is no abdominal tenderness. There is no guarding or rebound.  Musculoskeletal:        General: Normal range of motion.     Cervical back: Normal range of motion and neck supple.     Right lower leg: No edema.     Left lower leg: No edema.  Skin:    General: Skin is warm and dry.  Neurological:     Mental Status: He is alert.     GCS: GCS eye subscore is 4. GCS verbal subscore is 5. GCS motor subscore is 6.     Comments: Speech is clear and goal oriented, follows commands Major Cranial nerves without deficit, no facial droop Moves extremities without ataxia, coordination intact  Psychiatric:        Behavior: Behavior normal.     ED Results / Procedures / Treatments   Labs (all labs ordered are listed, but only abnormal results are displayed) Labs Reviewed  SARS CORONAVIRUS 2 BY RT PCR (HOSPITAL ORDER, [place] [facility])    EKG None  Radiology DG Chest  Port 1 View  Result Date: 11/06/2019 CLINICAL DATA:  Cough EXAM: PORTABLE CHEST 1 VIEW COMPARISON:  02/23/2019 FINDINGS: The heart size and mediastinal contours are within normal limits. Both lungs are clear. The visualized skeletal structures are unremarkable. IMPRESSION: No active disease. Electronically Signed   By: Inez Catalina M.D.   On: 11/06/2019 09:08    Procedures Procedures (including critical care time)  Medications Ordered in ED Medications - No data to display  ED  Course  I have reviewed the triage vital signs and the nursing notes.  Pertinent labs & imaging results that were available during my care of the patient were reviewed by me and considered in my medical decision making (see chart for details).     Derek Reed was evaluated in Emergency Department on 11/06/2019 for the symptoms described in the history of present illness. He was evaluated in the context of the global COVID-19 pandemic, which necessitated consideration that the patient might be at risk for infection with the SARS-CoV-2 virus that causes COVID-19. Institutional protocols and algorithms that pertain to the evaluation of patients at risk for COVID-19 are in a state of rapid change based on information released by regulatory bodies including the CDC and federal and state organizations. These policies and algorithms were followed during the patient's care in the ED.  MDM Rules/Calculators/A&P                          Additional History Obtained: 1. Nursing notes from this visit.  34 year old male with history as detailed above presents today for rhinorrhea and nonproductive cough x3 days.  No known sick contacts but his children are experiencing similar symptoms at home but patient attributes those to allergies.  On exam he is well-appearing no acute distress, cranial nerves intact, has clear rhinorrhea without evidence of sinusitis, airway clear without evidence of pharyngitis or deep space infection, no  meningismus, cardiopulmonary exam within normal limits, abdomen soft nontender without peritoneal signs, neurovascular intact to all 4 extremities without evidence of DVT or edema.  CXR:  IMPRESSION:  No active disease.  I have personally reviewed patient's chest x-ray and agree with radiologist interpretation.  Suspect patient is experiencing URI with nonproductive cough today.  He has no evidence of bacterial infection requiring antibiotics.  Additionally he has no chest pain or shortness of breath does not appear to be in CHF exacerbation.  He also does not take any ACE inhibitors.  Plan of care is to obtain Covid test and give patient work note he is to follow-up with his primary care provider.  No indication for blood work or further work-up here in the ER at this time.  Vital signs are stable, no fever, tachycardia, tachypnea or hypoxia on room air.  He is mildly hypertensive, patient encouraged to take blood pressure medications as prescribed and follow-up with PCP for blood pressure recheck later on this week.  He is asymptomatic regarding his elevated blood pressure reading.  At this time there does not appear to be any evidence of an acute emergency medical condition and the patient appears stable for discharge with appropriate outpatient follow up. Diagnosis was discussed with patient who verbalizes understanding of care plan and is agreeable to discharge. I have discussed return precautions with patient who verbalizes understanding. Patient encouraged to follow-up with their PCP. All questions answered.  Patient's case discussed with Dr. Judd Lien who agrees with plan to discharge with follow-up.   Note: Portions of this report may have been transcribed using voice recognition software. Every effort was made to ensure accuracy; however, inadvertent computerized transcription errors may still be present. Final Clinical Impression(s) / ED Diagnoses Final diagnoses:  Viral URI with cough    Elevated blood pressure reading    Rx / DC Orders ED Discharge Orders    None       Elizabeth Palau 11/06/19 1010    Geoffery Lyons, MD 11/07/19 [phone]

## 2019-11-06 NOTE — ED Notes (Signed)
Awaiting for ED Provider evaluation

## 2019-11-06 NOTE — ED Triage Notes (Addendum)
Nonproductive Cough and fatigue x4 days. Cough mostly at night until yesterday. Also sinus congestion and runny nose.

## 2019-11-14 ENCOUNTER — Other Ambulatory Visit: Payer: Self-pay | Admitting: Critical Care Medicine

## 2019-11-14 MED FILL — CARVEDILOL 6.25 MG TABLET: 6.25 | 30 days supply | Qty: 60 | Fill #5

## 2019-11-14 MED FILL — SPIRONOLACTONE 25 MG TABLET: 25 | 30 days supply | Qty: 30 | Fill #3

## 2019-11-14 MED FILL — POTASSIUM CL ER 20 MEQ TABL: 20 | 30 days supply | Qty: 30 | Fill #1

## 2019-11-14 MED FILL — FUROSEMIDE 40 MG TAB: 40 | 30 days supply | Qty: 30 | Fill #3

## 2019-11-14 NOTE — Addendum Note (Signed)
Addended by: Phillips Odor on: 11/14/2019 01:08 PM   Modules accepted: Orders

## 2019-11-14 NOTE — Telephone Encounter (Signed)
Spoke with [facility] and they all ready have refills on file and will get them ready for the patient.

## 2019-11-14 NOTE — Telephone Encounter (Signed)
PT need a refill furosemide (LASIX) 40 MG tablet [[id number]]  potassium chloride SA (KLOR-CON) 20 MEQ tablet [[id number]]  spironolactone (ALDACTONE) 25 MG tablet [[id number]]  carvedilol (COREG) 6.25 MG tablet [[id number]]   [facility] - [place], [place] - [place] [address]  [address] [place] [address]  Phone: [phone] Fax: [phone]

## 2019-12-27 MED FILL — POTASSIUM CL ER 20 MEQ TAB: 20 | 30 days supply | Qty: 30 | Fill #1

## 2019-12-27 MED FILL — SPIRONOLACTONE 25 MG TABLET: 25 | 30 days supply | Qty: 30 | Fill #0

## 2019-12-27 MED FILL — CARVEDILOL 6.25 MG TABLET: 6.25 | 30 days supply | Qty: 60 | Fill #0

## 2019-12-27 MED FILL — FUROSEMIDE 40 MG TAB: 40 | 30 days supply | Qty: 30 | Fill #4

## 2020-01-03 MED FILL — SPIRONOLACTONE 25 MG TABLET: 25 | 30 days supply | Qty: 30 | Fill #0

## 2020-01-03 MED FILL — CARVEDILOL 6.25 MG TABLET: 6.25 | 30 days supply | Qty: 60 | Fill #0

## 2020-01-03 MED FILL — FUROSEMIDE 40 MG TAB: 40 | 30 days supply | Qty: 30 | Fill #4

## 2020-01-03 MED FILL — POTASSIUM CL ER 20 MEQ TAB: 20 | 30 days supply | Qty: 30 | Fill #1

## 2020-01-16 ENCOUNTER — Telehealth (HOSPITAL_COMMUNITY): Payer: Self-pay | Admitting: Pharmacy Technician

## 2020-01-16 NOTE — Telephone Encounter (Signed)
Spoke with patient, will have application at the front check in desk for him to sign.  Will follow up.

## 2020-01-16 NOTE — Telephone Encounter (Signed)
It's time to re-enroll patient to receive medication assistance for Entresto from [employer]. Left voicemail for patient to call me back so that we can start the re-enrollment process.  Will follow up.    

## 2020-01-17 ENCOUNTER — Encounter (HOSPITAL_COMMUNITY): Payer: Self-pay | Admitting: Cardiology

## 2020-01-30 ENCOUNTER — Ambulatory Visit: Payer: Self-pay | Admitting: Critical Care Medicine

## 2020-02-12 ENCOUNTER — Ambulatory Visit: Payer: Self-pay | Admitting: Critical Care Medicine

## 2020-02-13 NOTE — Telephone Encounter (Signed)
Spoke with the patient as I have yet to receive his signed application. The patient stated that he received an application in the mail (I am assuming from [employer]) and sent that in.   Will call [employer] in regards to his application status.

## 2020-02-15 NOTE — Telephone Encounter (Signed)
Patient is going to come to the office to sign the assistance application. He did in fact stop taking the medications that were prescribed. I inquired the reason to why he stopped. The patient stated that he didn't like taking medications and thought he was going to work out and change his diet and that would suffice. I asked if the patient needed refills sent anywhere for him to be able to pick up to start taking them again. He stated that he already had the medications. I encouraged him to take the medications as prescribed. Also sent his provider a message.  Will fax application once signatures are obtained.

## 2020-02-15 NOTE — Telephone Encounter (Signed)
[employer], they have not received the patient's application. The representative stated that the patient has not gotten a refill since the 90 day supply that was sent out in April.   The patient is still active until the 21st of this month. I was able to get a refill sent out for him, he should receive that next Monday.  Will call the patient to follow up on his application and to confirm if he has been taking his Entresto.

## 2020-02-21 NOTE — Telephone Encounter (Signed)
Advanced Heart Failure Patient Advocate Encounter   Patient was approved to receive Entresto from [employer].  Patient ID: [id number] Effective dates: 02/21/20 through 02/28/21  Called and spoke with the patient.  Archer Asa, CPhT

## 2020-02-21 NOTE — Telephone Encounter (Signed)
Sent in application via fax.  Will follow up.  

## 2020-03-06 ENCOUNTER — Encounter (HOSPITAL_COMMUNITY): Payer: Self-pay | Admitting: Cardiology

## 2020-03-06 ENCOUNTER — Ambulatory Visit (HOSPITAL_COMMUNITY)
Admission: RE | Admit: 2020-03-06 | Discharge: 2020-03-06 | Disposition: A | Discharge status: Home / Self Care | Payer: Self-pay | Source: Ambulatory Visit | Attending: Cardiology | Admitting: Cardiology

## 2020-03-06 ENCOUNTER — Other Ambulatory Visit: Payer: Self-pay

## 2020-03-06 VITALS — BP 128/78 | HR 78 | Wt 254.4 lb

## 2020-03-06 DIAGNOSIS — I428 Other cardiomyopathies: Secondary | ICD-10-CM | POA: Insufficient documentation

## 2020-03-06 DIAGNOSIS — Z87891 Personal history of nicotine dependence: Secondary | ICD-10-CM | POA: Insufficient documentation

## 2020-03-06 DIAGNOSIS — I110 Hypertensive heart disease with heart failure: Secondary | ICD-10-CM | POA: Insufficient documentation

## 2020-03-06 DIAGNOSIS — Z79899 Other long term (current) drug therapy: Secondary | ICD-10-CM | POA: Insufficient documentation

## 2020-03-06 DIAGNOSIS — Z8249 Family history of ischemic heart disease and other diseases of the circulatory system: Secondary | ICD-10-CM | POA: Insufficient documentation

## 2020-03-06 DIAGNOSIS — I5022 Chronic systolic (congestive) heart failure: Secondary | ICD-10-CM | POA: Insufficient documentation

## 2020-03-06 LAB — BASIC METABOLIC PANEL
Anion gap: 10 (ref 5–15)
BUN: 11 mg/dL (ref 6–20)
CO2: 22 mmol/L (ref 22–32)
Calcium: 9 mg/dL (ref 8.9–10.3)
Chloride: 107 mmol/L (ref 98–111)
Creatinine, Ser: 1.6 mg/dL — ABNORMAL HIGH (ref 0.61–1.24)
GFR, Estimated: 57 mL/min — ABNORMAL LOW (ref >60)
Glucose, Bld: 174 mg/dL — ABNORMAL HIGH (ref 70–99)
Potassium: 3.2 mmol/L — ABNORMAL LOW (ref 3.5–5.1)
Sodium: 139 mmol/L (ref 135–145)

## 2020-03-06 NOTE — Progress Notes (Signed)
Advanced Heart Failure Clinic Note   Referring Physician: PCP: Storm Frisk, MD Cardiologist: Dr. Shirlee Latch  34 y.o. with PMH of untreated HTN who was admitted to [facility] 02/24/19 w/ acute CHF. He presented with acute dyspnea and was found to be markedly hypertensive and in acute pulmonary edema. He was started on IV diuretics and antihypertensives. 2D echo showed severely reduced LVEF 25-30% with mild LV dilation and mild LV hypertrophy, mildly decreased RV systolic function. LHC/RHC showed normal filling pressures and cardiac output after diuresis, no coronary disease on angiography. No h/o drugs/ETOH/smoking. Mother with "heart disease" but he is not clear on the diagnosis.   Primary etiology for his HF was felt to be poorly controlled HTN. After he was diuresed back to euvolemic state, he was transitioned to PO lasix. Also treated w/ Entresto, Spironolactone and Coreg. Discharge weight was 231 lb.   Echo in 1/21 showed that EF is significantly improved, up to 55-60%, mild LVH, normal RV.   He returns for followup of CHF.  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  He is very active, no dyspnea with exertion.  No lightheadedness, orthopnea, or chest pain.  He stopped taking his meds for a while but has been back on everything for about a month.   Labs (6/21): K 4.2, creatinine 1.54   PMH: 1. HTN 2. Chronic systolic CHF: Nonischemic cardiomyopathy.  - Echo (10/20): EF 25-30%, mildly decreased RV systolic function.  - LHC/RHC (10/20): No significant CAD.  Mean RA 3, PA 32/10, mean PCWP 12, CI 2.69.  - Echo (1/21): EF 55-60%, mild LVH, normal RV.   Review of Systems: All systems reviewed and negative except as per HPI.   Current Outpatient Medications  Medication Sig Dispense Refill  . carvedilol (COREG) 6.25 MG tablet Take 1 tablet (6.25 mg total) by mouth 2 (two) times daily with a meal. 60 tablet 5  . furosemide (LASIX) 40 MG tablet Take 1 tablet (40 mg total) by mouth daily as  needed for fluid (and shortness of breath). 30 tablet 5  . isosorbide-hydrALAZINE (BIDIL) 20-37.5 MG tablet Take 2 tablets by mouth 3 (three) times daily. 540 tablet 3  . potassium chloride SA (KLOR-CON) 20 MEQ tablet Take 1 tablet (20 mEq total) by mouth daily. 30 tablet 1  . sacubitril-valsartan (ENTRESTO) 97-103 MG Take 1 tablet by mouth 2 (two) times daily. 180 tablet 3  . spironolactone (ALDACTONE) 25 MG tablet Take 1 tablet (25 mg total) by mouth daily. 30 tablet 3   No current facility-administered medications for this encounter.    No Known Allergies    Social History   Socioeconomic History  . Marital status: Single    Spouse name: Not on file  . Number of children: Not on file  . Years of education: Not on file  . Highest education level: Not on file  Occupational History  . Not on file  Tobacco Use  . Smoking status: Former Games developer  . Smokeless tobacco: Never Used  . Tobacco comment: quit in January  Substance and Sexual Activity  . Alcohol use: No  . Drug use: No  . Sexual activity: Not on file  Other Topics Concern  . Not on file  Social History Narrative  . Not on file   Social Determinants of Health   Financial Resource Strain:   . Difficulty of Paying Living Expenses: Not on file  Food Insecurity:   . Worried About Programme researcher, broadcasting/film/video in the Last Year: Not  on file  . Ran Out of Food in the Last Year: Not on file  Transportation Needs:   . Lack of Transportation (Medical): Not on file  . Lack of Transportation (Non-Medical): Not on file  Physical Activity:   . Days of Exercise per Week: Not on file  . Minutes of Exercise per Session: Not on file  Stress:   . Feeling of Stress : Not on file  Social Connections:   . Frequency of Communication with Friends and Family: Not on file  . Frequency of Social Gatherings with Friends and Family: Not on file  . Attends Religious Services: Not on file  . Active Member of Clubs or Organizations: Not on file  .  Attends Banker Meetings: Not on file  . Marital Status: Not on file  Intimate Partner Violence:   . Fear of Current or Ex-Partner: Not on file  . Emotionally Abused: Not on file  . Physically Abused: Not on file  . Sexually Abused: Not on file      Family History  Problem Relation Age of Onset  . Hyperlipidemia Mother   . Diabetes Mother   . Stroke Mother   . Hypertension Mother   . Hypertension Father     Vitals:   03/06/20 0837  BP: 128/78  Pulse: 78  SpO2: 98%  Weight: 115.4 kg (254 lb 6.4 oz)   PHYSICAL EXAM: General: NAD Neck: No JVD, no thyromegaly or thyroid nodule.  Lungs: Clear to auscultation bilaterally with normal respiratory effort. CV: Nondisplaced PMI.  Heart regular S1/S2, no S3/S4, no murmur.  No peripheral edema.  No carotid bruit.  Normal pedal pulses.  Abdomen: Soft, nontender, no hepatosplenomegaly, no distention.  Skin: Intact without lesions or rashes.  Neurologic: Alert and oriented x 3.  Psych: Normal affect. Extremities: No clubbing or cyanosis.  HEENT: Normal.   ASSESSMENT & PLAN:  1. Chronic systolic CHF: Nonischemic cardiomyopathy.  Echo 10/20 w/ reduced LVEF 25-30% with mild LV dilation and mild LV hypertrophy, mildly decreased RV systolic function. Cause of cardiomyopathy may be long-standing uncontrolled HTN versus viral myocarditis. LHC/RHC showed normal filling pressures and cardiac output after diuresis, no coronary disease on angiography. No drugs/ETOH/smoking. Mother with "heart disease" but he is not clear on the diagnosis.  Echo in 1/21 with significant improvement, EF up to 55-60% with medical treatment. NYHA class I symptoms, euvolemic.  - We discussed meds, I think that he should continue the current regimen for now.  He is tolerating the meds and BP is controlled.  - Continue Entresto 97/103 bid - No Lasix needed.   - Continue spironolactone 25 mg daily.  BMET today.  - Continue Coreg 6.25 mg bid.   - He can  decrease Bidil to 2 tabs bid to make regimen less complicated.  - Repeat echo with followup in 6 months to make sure that EF remains back to normal.  2. HTN: BP controlled on current regimen.   Followup with me in 6 months with echo.     Marca Ancona, MD 03/06/20

## 2020-03-06 NOTE — Patient Instructions (Signed)
Decrease Bidil to Twice daily   Labs done today, your results will be available in MyChart, we will contact you for abnormal readings.  Your physician recommends that you return for lab work in: 3 months  Please call our office in March 2022 to schedule your follow up appointment and echocardiogram  If you have any questions or concerns before your next appointment please send [place] a message through [place] or call our office at [phone].    TO LEAVE A MESSAGE FOR THE NURSE SELECT OPTION 2, PLEASE LEAVE A MESSAGE INCLUDING: . YOUR NAME . DATE OF BIRTH . CALL BACK NUMBER . REASON FOR CALL**this is important as we prioritize the call backs  YOU WILL RECEIVE A CALL BACK THE SAME DAY AS LONG AS YOU CALL BEFORE 4:00 PM  At the [facility] Failure Clinic, you and your health needs are our priority. As part of our continuing mission to provide you with exceptional heart care, we have created designated Provider Care Teams. These Care Teams include your primary Cardiologist (physician) and Advanced Practice Providers (APPs- Physician Assistants and Nurse Practitioners) who all work together to provide you with the care you need, when you need it.   You may see any of the following providers on your designated Care Team at your next follow up: Marland Kitchen Dr Arvilla Meres . Dr Marca Ancona . Tonye Becket, NP . Robbie Lis, PA . Karle Plumber, PharmD   Please be sure to bring in all your medications bottles to every appointment.

## 2020-03-07 ENCOUNTER — Telehealth (HOSPITAL_COMMUNITY): Payer: Self-pay

## 2020-03-07 ENCOUNTER — Other Ambulatory Visit (HOSPITAL_COMMUNITY): Payer: Self-pay | Admitting: Cardiology

## 2020-03-07 DIAGNOSIS — I5022 Chronic systolic (congestive) heart failure: Secondary | ICD-10-CM

## 2020-03-07 MED ORDER — POTASSIUM CHLORIDE CRYS ER 20 MEQ PO TBCR: 40.0000 meq | EXTENDED_RELEASE_TABLET | Freq: Every day | ORAL | 4 refills | Status: DC

## 2020-03-07 MED FILL — POTASSIUM CL ER 20 MEQ TAB: 20 | 30 days supply | Qty: 60 | Fill #0

## 2020-03-07 NOTE — Telephone Encounter (Signed)
-----   Message from Laurey Morale, MD sent at 03/06/2020  3:02 PM EDT ----- Add KCl 20 daily to regimen, BMET 2 wks.

## 2020-03-07 NOTE — Telephone Encounter (Signed)
Pt advised of lab results and recommendations to take additional potassium daily. Pt currently takes 1 tab daily. He will start to take 2 tabs daily. Appointment made for 2 weeks to return for repeat labs.  Verbalized understanding.

## 2020-03-13 MED FILL — FUROSEMIDE 40 MG TAB: 40 | 30 days supply | Qty: 30 | Fill #5

## 2020-03-13 MED FILL — CARVEDILOL 6.25 MG TABLET: 6.25 | 30 days supply | Qty: 60 | Fill #1

## 2020-03-13 MED FILL — SPIRONOLACTONE 25 MG TABLET: 25 | 30 days supply | Qty: 30 | Fill #1

## 2020-03-14 MED FILL — POTASSIUM CL ER 20 MEQ TAB: 20 | 30 days supply | Qty: 60 | Fill #0

## 2020-03-19 ENCOUNTER — Ambulatory Visit: Payer: Self-pay

## 2020-03-19 NOTE — Telephone Encounter (Signed)
Patient called stating that he had chest pain. He states that it comes and goes lasting a few minutes.  He states it is center chest and to the left. He rates that pain as mild at 2-3. It does not radiate. He denies injury. He works for [employer] and lifts some heavy totes but has never felt an injury. He has Hx of high BP and some family history of heart dx. He has a cough but only at night which he feels is from allergies. This pain does not get worse with activity. He denies nausea vomiting SOB sweats. Per protocol patient will go to UC for evaluation. Care advice was read to patient. He verbalized understanding.  He was encouraged to call back after being evaluated for follow up appointment.   Reason for Disposition . [1] Chest pain lasting < 5 minutes AND [2] NO chest pain or cardiac symptoms (e.g., breathing difficulty, sweating) now (Exception: chest pains that last only a few seconds)  Answer Assessment - Initial Assessment Questions 1. LOCATION: "Where does it hurt?"       Center left 2. RADIATION: "Does the pain go anywhere else?" (e.g., into neck, jaw, arms, back)     no 3. ONSET: "When did the chest pain begin?" (Minutes, hours or days)      3 days 4. PATTERN "Does the pain come and go, or has it been constant since it started?"  "Does it get worse with exertion?"      Comes and goes, no 5. DURATION: "How long does it last" (e.g., seconds, minutes, hours)     A few minutes 6. SEVERITY: "How bad is the pain?"  (e.g., Scale 1-10; mild, moderate, or severe)    - MILD (1-3): doesn't interfere with normal activities     - MODERATE (4-7): interferes with normal activities or awakens from sleep    - SEVERE (8-10): excruciating pain, unable to do any normal activities       2-3 7. CARDIAC RISK FACTORS: "Do you have any history of heart problems or risk factors for heart disease?" (e.g., angina, prior heart attack; diabetes, high blood pressure, high cholesterol, smoker, or strong family history  of heart disease)     Htn, thinks some in family 8. PULMONARY RISK FACTORS: "Do you have any history of lung disease?"  (e.g., blood clots in lung, asthma, emphysema, birth control pills)     no 9. CAUSE: "What do you think is causing the chest pain?"     no 10. OTHER SYMPTOMS: "Do you have any other symptoms?" (e.g., dizziness, nausea, vomiting, sweating, fever, difficulty breathing, cough)       Slight cough 11. PREGNANCY: "Is there any chance you are pregnant?" "When was your last menstrual period?"       N/A  Protocols used: CHEST PAIN-A-AH

## 2020-03-20 ENCOUNTER — Encounter (HOSPITAL_BASED_OUTPATIENT_CLINIC_OR_DEPARTMENT_OTHER): Payer: Self-pay

## 2020-03-20 ENCOUNTER — Other Ambulatory Visit: Payer: Self-pay

## 2020-03-20 ENCOUNTER — Emergency Department (HOSPITAL_BASED_OUTPATIENT_CLINIC_OR_DEPARTMENT_OTHER)
Admission: EM | Admit: 2020-03-20 | Discharge: 2020-03-20 | Disposition: A | Discharge status: Home / Self Care | Payer: 59 | Attending: Emergency Medicine | Admitting: Emergency Medicine

## 2020-03-20 ENCOUNTER — Emergency Department (HOSPITAL_BASED_OUTPATIENT_CLINIC_OR_DEPARTMENT_OTHER): Payer: 59

## 2020-03-20 DIAGNOSIS — R079 Chest pain, unspecified: Secondary | ICD-10-CM

## 2020-03-20 DIAGNOSIS — Z79899 Other long term (current) drug therapy: Secondary | ICD-10-CM | POA: Diagnosis not present

## 2020-03-20 DIAGNOSIS — I130 Hypertensive heart and chronic kidney disease with heart failure and stage 1 through stage 4 chronic kidney disease, or unspecified chronic kidney disease: Secondary | ICD-10-CM | POA: Diagnosis not present

## 2020-03-20 DIAGNOSIS — N182 Chronic kidney disease, stage 2 (mild): Secondary | ICD-10-CM | POA: Diagnosis not present

## 2020-03-20 DIAGNOSIS — R0789 Other chest pain: Secondary | ICD-10-CM | POA: Diagnosis present

## 2020-03-20 DIAGNOSIS — I5022 Chronic systolic (congestive) heart failure: Secondary | ICD-10-CM | POA: Diagnosis not present

## 2020-03-20 LAB — CBC WITH DIFFERENTIAL/PLATELET
Abs Immature Granulocytes: 0.03 10*3/uL (ref 0.00–0.07)
Basophils Absolute: 0 10*3/uL (ref 0.0–0.1)
Basophils Relative: 0 %
Eosinophils Absolute: 0.3 10*3/uL (ref 0.0–0.5)
Eosinophils Relative: 4 %
HCT: 38.8 % — ABNORMAL LOW (ref 39.0–52.0)
Hemoglobin: 12.2 g/dL — ABNORMAL LOW (ref 13.0–17.0)
Immature Granulocytes: 0 %
Lymphocytes Relative: 50 %
Lymphs Abs: 3.6 10*3/uL (ref 0.7–4.0)
MCH: 23.2 pg — ABNORMAL LOW (ref 26.0–34.0)
MCHC: 31.4 g/dL (ref 30.0–36.0)
MCV: 73.8 fL — ABNORMAL LOW (ref 80.0–100.0)
Monocytes Absolute: 0.5 10*3/uL (ref 0.1–1.0)
Monocytes Relative: 8 %
Neutro Abs: 2.7 10*3/uL (ref 1.7–7.7)
Neutrophils Relative %: 38 %
Platelets: 276 10*3/uL (ref 150–400)
RBC: 5.26 MIL/uL (ref 4.22–5.81)
RDW: 15.7 % — ABNORMAL HIGH (ref 11.5–15.5)
WBC: 7.1 10*3/uL (ref 4.0–10.5)
nRBC: 0 % (ref 0.0–0.2)

## 2020-03-20 LAB — COMPREHENSIVE METABOLIC PANEL
ALT: 17 U/L (ref 0–44)
AST: 18 U/L (ref 15–41)
Albumin: 3.7 g/dL (ref 3.5–5.0)
Alkaline Phosphatase: 85 U/L (ref 38–126)
Anion gap: 9 (ref 5–15)
BUN: 11 mg/dL (ref 6–20)
CO2: 25 mmol/L (ref 22–32)
Calcium: 8.5 mg/dL — ABNORMAL LOW (ref 8.9–10.3)
Chloride: 105 mmol/L (ref 98–111)
Creatinine, Ser: 1.68 mg/dL — ABNORMAL HIGH (ref 0.61–1.24)
GFR, Estimated: 54 mL/min — ABNORMAL LOW (ref >60)
Glucose, Bld: 102 mg/dL — ABNORMAL HIGH (ref 70–99)
Potassium: 3.8 mmol/L (ref 3.5–5.1)
Sodium: 139 mmol/L (ref 135–145)
Total Bilirubin: 0.4 mg/dL (ref 0.3–1.2)
Total Protein: 7.3 g/dL (ref 6.5–8.1)

## 2020-03-20 LAB — TROPONIN I (HIGH SENSITIVITY)
Troponin I (High Sensitivity): 24 ng/L — ABNORMAL HIGH (ref <18)
Troponin I (High Sensitivity): 25 ng/L — ABNORMAL HIGH (ref <18)

## 2020-03-20 LAB — BRAIN NATRIURETIC PEPTIDE: B Natriuretic Peptide: 30 pg/mL (ref 0.0–100.0)

## 2020-03-20 NOTE — Discharge Instructions (Addendum)
Like we discussed, please follow up with Dr. Shirlee Latch regarding your symptoms and get a reevaluation with him as soon as possible.  If your symptoms worsen, you can always return to the ER for reevaluation.   It was a pleasure to meet you.

## 2020-03-20 NOTE — ED Triage Notes (Addendum)
Pt c/o CP x 3 days-denies fever/flu sx-NAD-steady gait

## 2020-03-20 NOTE — ED Provider Notes (Signed)
[facility] EMERGENCY DEPARTMENT Provider Note   CSN: [id number] Arrival date & time: 03/20/20  1534     History Chief Complaint  Patient presents with  . Chest Pain    Derek Reed is a 34 y.o. male.  HPI   Patient is a 34 year old male with a history of acute CHF, hypertension, CKD, obesity that presents to the ED with CP x 3 days. Describes it as central/left sided, 2/10, aching. Occurs 2-3 x per day. Sometimes will be working and moving around when it happens and other times at rest.  Sometimes helps to stretch when the pain is occuring. Lasts 2-3 minutes. Spontaneously alleviates. States he was coughing at night initially but that his has resolved.  Works at [employer] and is running around and lifting throughout the day. No SOB, diaphoresis, n/v, abd pain, recent illnesses. He is compliant with his home medications. No smoking.   HPI from Cardiology Appointment with Dr. Shirlee Latch on 10/27:  34 y.o. with PMH of untreated HTN who was admitted to [facility] 02/24/19 w/ acute CHF. He presented with acute dyspnea and was found to be markedly hypertensive and in acute pulmonary edema. He was started on IV diuretics and antihypertensives. 2D echo showed severely reduced LVEF 25-30% with mild LV dilation and mild LV hypertrophy, mildly decreased RV systolic function. LHC/RHC showed normal filling pressures and cardiac output after diuresis, no coronary disease on angiography. No h/o drugs/ETOH/smoking. Mother with "heart disease" but he is not clear on the diagnosis.   Primary etiology for his HF was felt to be poorly controlled HTN. After he was diuresed back to euvolemic state, he was transitioned to PO lasix. Also treated w/ Entresto, Spironolactone and Coreg. Discharge weight was 231 lb.   Echo in 1/21 showed that EF is significantly improved, up to 55-60%, mild LVH, normal RV.   He returns for followup of CHF.  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  He is very active, no dyspnea  with exertion.  No lightheadedness, orthopnea, or chest pain.  He stopped taking his meds for a while but has been back on everything for about a month.      Past Medical History:  Diagnosis Date  . Acute CHF (congestive heart failure) (HCC) 02/24/2019  . Anxiety   . Hypertension   . Obesity   . Peritonsillar abscess     Patient Active Problem List   Diagnosis Date Noted  . HTN (hypertension) 02/24/2019  . 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 GAD (generalized anxiety disorder) 03/26/2014  . BMI 36.0-36.9,adult 03/26/2014    Past Surgical History:  Procedure Laterality Date  . INCISION AND DRAINAGE OF PERITONSILLAR ABCESS    . RIGHT/LEFT HEART CATH AND CORONARY ANGIOGRAPHY N/A 02/24/2019   Procedure: RIGHT/LEFT HEART CATH AND CORONARY ANGIOGRAPHY;  Surgeon: Laurey Morale, MD;  Location: [facility] INVASIVE CV LAB;  Service: Cardiovascular;  Laterality: N/A;       Family History  Problem Relation Age of Onset  . Hyperlipidemia Mother   . Diabetes Mother   . Stroke Mother   . Hypertension Mother   . Hypertension Father     Social History   Tobacco Use  . Smoking status: Former Games developer  . Smokeless tobacco: Never Used  . Tobacco comment: quit in January  Vaping Use  . Vaping Use: Never used  Substance Use Topics  . Alcohol use: No  . Drug use: No  Home Medications Prior to Admission medications   Medication Sig Start Date End Date Taking? Authorizing Provider  carvedilol (COREG) 6.25 MG tablet Take 1 tablet (6.25 mg total) by mouth 2 (two) times daily with a meal. 09/27/19   Storm Frisk, MD  furosemide (LASIX) 40 MG tablet Take 1 tablet (40 mg total) by mouth daily as needed for fluid (and shortness of breath). 09/27/19   Storm Frisk, MD  isosorbide-hydrALAZINE (BIDIL) 20-37.5 MG tablet Take 2 tablets by mouth 3 (three) times daily. 09/27/19   Storm Frisk, MD  potassium  chloride SA (KLOR-CON) 20 MEQ tablet Take 2 tablets (40 mEq total) by mouth daily. 03/07/20   Laurey Morale, MD  sacubitril-valsartan (ENTRESTO) 97-103 MG Take 1 tablet by mouth 2 (two) times daily. 09/27/19   Storm Frisk, MD  spironolactone (ALDACTONE) 25 MG tablet Take 1 tablet (25 mg total) by mouth daily. 09/27/19   Storm Frisk, MD    Allergies    Patient has no known allergies.  Review of Systems   Review of Systems  All other systems reviewed and are negative. Ten systems reviewed and are negative for acute change, except as noted in the HPI.   Physical Exam Updated Vital Signs BP (!) 141/96 (BP Location: Right Arm)   Pulse 71   Temp 98.4 F (36.9 C) (Oral)   Resp 15   SpO2 98%   Physical Exam Vitals and nursing note reviewed.  Constitutional:      General: He is not in acute distress.    Appearance: Normal appearance. He is well-developed. He is obese. He is not ill-appearing, toxic-appearing or diaphoretic.  HENT:     Head: Normocephalic and atraumatic.     Right Ear: External ear normal.     Left Ear: External ear normal.     Nose: Nose normal.     Mouth/Throat:     Mouth: Mucous membranes are moist.     Pharynx: Oropharynx is clear. No oropharyngeal exudate or posterior oropharyngeal erythema.  Eyes:     Extraocular Movements: Extraocular movements intact.  Cardiovascular:     Rate and Rhythm: Normal rate and regular rhythm.     Pulses: Normal pulses.          Radial pulses are 2+ on the right side and 2+ on the left side.       Dorsalis pedis pulses are 2+ on the right side and 2+ on the left side.     Heart sounds: Normal heart sounds. Heart sounds not distant. No murmur heard.  No diastolic murmur is present.  No friction rub. No gallop.      Comments: RRR without M/R/G.  Pulmonary:     Effort: Pulmonary effort is normal. No tachypnea, accessory muscle usage or respiratory distress.     Breath sounds: Normal breath sounds. No stridor. No  decreased breath sounds, wheezing, rhonchi or rales.     Comments: LCTAB without wheezing, rales or rhonchi. Abdominal:     General: Abdomen is flat.     Palpations: Abdomen is soft.     Tenderness: There is no abdominal tenderness.     Comments: Abdomen is soft and non-tender.   Musculoskeletal:        General: Normal range of motion.     Cervical back: Normal range of motion and neck supple. No tenderness.     Right lower leg: No tenderness. No edema.     Left lower leg: No tenderness. No  edema.     Comments: No leg swelling. No calf pain. Palpable and symmetrical pedal pulses.   Skin:    General: Skin is warm and dry.  Neurological:     General: No focal deficit present.     Mental Status: He is alert and oriented to person, place, and time.  Psychiatric:        Mood and Affect: Mood normal.        Behavior: Behavior normal.    ED Results / Procedures / Treatments   Labs (all labs ordered are listed, but only abnormal results are displayed) Labs Reviewed  COMPREHENSIVE METABOLIC PANEL - Abnormal; Notable for the following components:      Result Value   Glucose, Bld 102 (*)    Creatinine, Ser 1.68 (*)    Calcium 8.5 (*)    GFR, Estimated 54 (*)    All other components within normal limits  CBC WITH DIFFERENTIAL/PLATELET - Abnormal; Notable for the following components:   Hemoglobin 12.2 (*)    HCT 38.8 (*)    MCV 73.8 (*)    MCH 23.2 (*)    RDW 15.7 (*)    All other components within normal limits  TROPONIN I (HIGH SENSITIVITY) - Abnormal; Notable for the following components:   Troponin I (High Sensitivity) 24 (*)    All other components within normal limits  TROPONIN I (HIGH SENSITIVITY) - Abnormal; Notable for the following components:   Troponin I (High Sensitivity) 25 (*)    All other components within normal limits  BRAIN NATRIURETIC PEPTIDE   EKG EKG Interpretation  Date/Time:  Wednesday March 20 2020 15:46:58 EST Ventricular Rate:  69 PR Interval:     QRS Duration: 116 QT Interval:  388 QTC Calculation: 416 R Axis:   -15 Text Interpretation: Sinus rhythm LVH with IVCD and secondary repol abnrm , new T wave inversion Inferolateral leads Confirmed by Gwyneth Sprout ([id number]) on 03/20/2020 4:24:31 PM   Radiology DG Chest Portable 1 View  Result Date: 03/20/2020 CLINICAL DATA:  History of CHF and chest pain EXAM: PORTABLE CHEST 1 VIEW COMPARISON:  None. FINDINGS: The heart size and mediastinal contours are within normal limits. Both lungs are clear. The visualized skeletal structures are unremarkable. IMPRESSION: No active disease. Electronically Signed   By: Jonna Clark M.D.   On: 03/20/2020 16:33    Procedures Procedures (including critical care time)  Medications Ordered in ED Medications - No data to display  ED Course  I have reviewed the triage vital signs and the nursing notes.  Pertinent labs & imaging results that were available during my care of the patient were reviewed by me and considered in my medical decision making (see chart for details).  Clinical Course as of Mar 20 1952  Wed Mar 20, 2020  1650 Creatinine was at 1.6, 2 weeks ago  Creatinine(!): 1.68 [LJ]  1650 Hemoglobin(!): 12.2 [LJ]  1650 No active disease.  DG Chest Portable 1 View [LJ]  1709 Troponin I (High Sensitivity)(!): 24 [LJ]  1709 B Natriuretic Peptide: 30.0 [LJ]  1727 I spoke to Dr. Tresa Endo with cardiology. He recommends that we repeat ECG and confirm arm leads are not on patient's clavicles. Recommends delta troponin. Pt had a reassuring heart cath last October and doubts this is an ACS event. If flat delta trop, recommends early f/u with Dr. Shirlee Latch.   [LJ]    Clinical Course User Index [LJ] Placido Sou, PA-C   MDM Rules/Calculators/A&P  Patient is a 34 year old male who presents to the emergency department due to chest pain for the past 3 days.  Patient has a significant medical history for hypertension as  well as acute onset CHF last year.  He is followed by Dr. Shirlee Latch with cardiology.  Initial ECG today showed a new T wave inversion along the inferolateral leads.  This was repeated showing the same findings.  I obtained basic labs.  Very mild elevation in his troponin at 24.  Delta troponin 25.  No elevation in BNP.  Doubt CHF exacerbation. No leg swelling. LCTAB. CXR negative.  Patient's repeat echocardiogram in January of this year showed an LVEF between 55 to 60% which was significantly improved from his initial CHF exacerbation in 2020. Pt is scheduled for a repeat echocardiogram which has not been obtained as of yet. CBC without leukocytosis.  Hemoglobin stable.  CMP with a creatinine of 1.68 which appears to be similar to priors.  Given his medical history, he was discussed with Dr. Tresa Endo with cardiology.  He had a reassuring heart cath last year and doubts this is ACS.  Recommended delta troponin as well as a repeat ECG with arm leads on patient's arm and not near the clavicles.  We repeated the ECG as instructed and this cleared up the new T wave inversions.  I discussed the findings with the patient in length.  We discussed his blood pressure.  I recommended that he reach out to his cardiologist Dr. Shirlee Latch, as soon as possible to schedule a follow-up visit.  He understands to return to the ED with any new or worsening symptoms.  His questions were answered and he was amicable at the time of discharge.   An After Visit Summary was printed and given to the patient.  Patient discharged to home/self care.  Condition at discharge: Stable  Note: Portions of this report may have been transcribed using voice recognition software. Every effort was made to ensure accuracy; however, inadvertent computerized transcription errors may be present.    Final Clinical Impression(s) / ED Diagnoses Final diagnoses:  Chest pain, unspecified type   Rx / DC Orders ED Discharge Orders    None         Placido Sou, PA-C 03/20/20 1956    Gwyneth Sprout, MD 03/21/20 1814

## 2020-03-20 NOTE — ED Notes (Signed)
Assumed care of patient from Lacey, RN.   

## 2020-03-20 NOTE — ED Notes (Signed)
Discharge instructions discussed with patient. Verbalized understanding. Departs ED at this time in stable condition.  

## 2020-03-21 ENCOUNTER — Ambulatory Visit (HOSPITAL_COMMUNITY)
Admission: RE | Admit: 2020-03-21 | Discharge: 2020-03-21 | Disposition: A | Discharge status: Home / Self Care | Payer: 59 | Source: Ambulatory Visit | Attending: Internal Medicine | Admitting: Internal Medicine

## 2020-03-21 DIAGNOSIS — I5022 Chronic systolic (congestive) heart failure: Secondary | ICD-10-CM

## 2020-03-21 LAB — BASIC METABOLIC PANEL
Anion gap: 11 (ref 5–15)
BUN: 10 mg/dL (ref 6–20)
CO2: 21 mmol/L — ABNORMAL LOW (ref 22–32)
Calcium: 8.8 mg/dL — ABNORMAL LOW (ref 8.9–10.3)
Chloride: 106 mmol/L (ref 98–111)
Creatinine, Ser: 1.47 mg/dL — ABNORMAL HIGH (ref 0.61–1.24)
GFR, Estimated: >60 mL/min (ref >60)
Glucose, Bld: 184 mg/dL — ABNORMAL HIGH (ref 70–99)
Potassium: 3.6 mmol/L (ref 3.5–5.1)
Sodium: 138 mmol/L (ref 135–145)

## 2020-03-27 ENCOUNTER — Other Ambulatory Visit (HOSPITAL_COMMUNITY): Payer: Self-pay | Admitting: Cardiology

## 2020-03-27 ENCOUNTER — Encounter (HOSPITAL_COMMUNITY): Payer: Self-pay | Admitting: Cardiology

## 2020-03-27 ENCOUNTER — Other Ambulatory Visit: Payer: Self-pay

## 2020-03-27 ENCOUNTER — Ambulatory Visit (HOSPITAL_COMMUNITY)
Admission: RE | Admit: 2020-03-27 | Discharge: 2020-03-27 | Disposition: A | Discharge status: Home / Self Care | Payer: 59 | Source: Ambulatory Visit | Attending: Cardiology | Admitting: Cardiology

## 2020-03-27 VITALS — BP 140/108 | HR 65 | Wt 251.6 lb

## 2020-03-27 DIAGNOSIS — Z79899 Other long term (current) drug therapy: Secondary | ICD-10-CM | POA: Diagnosis not present

## 2020-03-27 DIAGNOSIS — Z87891 Personal history of nicotine dependence: Secondary | ICD-10-CM | POA: Insufficient documentation

## 2020-03-27 DIAGNOSIS — Z8249 Family history of ischemic heart disease and other diseases of the circulatory system: Secondary | ICD-10-CM | POA: Insufficient documentation

## 2020-03-27 DIAGNOSIS — I5022 Chronic systolic (congestive) heart failure: Secondary | ICD-10-CM | POA: Insufficient documentation

## 2020-03-27 DIAGNOSIS — I110 Hypertensive heart disease with heart failure: Secondary | ICD-10-CM | POA: Diagnosis not present

## 2020-03-27 DIAGNOSIS — R079 Chest pain, unspecified: Secondary | ICD-10-CM | POA: Insufficient documentation

## 2020-03-27 DIAGNOSIS — I428 Other cardiomyopathies: Secondary | ICD-10-CM | POA: Insufficient documentation

## 2020-03-27 MED ORDER — CARVEDILOL 12.5 MG PO TABS: 12.5000 mg | ORAL_TABLET | Freq: Two times a day (BID) | ORAL | 3 refills | Status: DC

## 2020-03-27 NOTE — Progress Notes (Signed)
Advanced Heart Failure Clinic Note   Referring Physician: PCP: Storm Frisk, MD Cardiologist: Dr. Shirlee Latch  34 y.o. with PMH of untreated HTN who was admitted to [facility] 02/24/19 w/ acute CHF. He presented with acute dyspnea and was found to be markedly hypertensive and in acute pulmonary edema. He was started on IV diuretics and antihypertensives. 2D echo showed severely reduced LVEF 25-30% with mild LV dilation and mild LV hypertrophy, mildly decreased RV systolic function. LHC/RHC showed normal filling pressures and cardiac output after diuresis, no coronary disease on angiography. No h/o drugs/ETOH/smoking. Mother with "heart disease" but he is not clear on the diagnosis.   Primary etiology for his HF was felt to be poorly controlled HTN. After he was diuresed back to euvolemic state, he was transitioned to PO lasix. Also treated w/ Entresto, Spironolactone and Coreg. Discharge weight was 231 lb.   Echo in 1/21 showed that EF is significantly improved, up to 55-60%, mild LVH, normal RV.   Patient went to the ER on 11/10 with chest pain.  He had been having episodes for about 3 days.  He would have substernal tightness that would occur at rest or with exertion, lasting for about a few minutes then resolving.  ER workup showed very slight TnI elevation with no trend (24 => 25).  He was discharged home and told to followup with [place] this week.  Since the ER visit, he has had 2 more short episodes of chest pain, not exertional.  BP is elevated today, he says that he took all his meds.  No exertional dyspnea.   ECG (personally reviewed): NSR, LVH with anterolateral T wave flattening.  Similar to prior.   Labs (6/21): K 4.2, creatinine 1.54  Labs (11/21): K 3.6, creatinine 1.47, HS-TnI 24 => 25, BNP 30.   PMH: 1. HTN 2. Chronic systolic CHF: Nonischemic cardiomyopathy.  - Echo (10/20): EF 25-30%, mildly decreased RV systolic function.  - LHC/RHC (10/20): No significant CAD.  Mean RA 3, PA  32/10, mean PCWP 12, CI 2.69.  - Echo (1/21): EF 55-60%, mild LVH, normal RV.   Review of Systems: All systems reviewed and negative except as per HPI.   Current Outpatient Medications  Medication Sig Dispense Refill  . carvedilol (COREG) 12.5 MG tablet Take 1 tablet (12.5 mg total) by mouth 2 (two) times daily with a meal. 60 tablet 3  . furosemide (LASIX) 40 MG tablet Take 40 mg by mouth daily.    . isosorbide-hydrALAZINE (BIDIL) 20-37.5 MG tablet Take 2 tablets by mouth 3 (three) times daily. 540 tablet 3  . potassium chloride SA (KLOR-CON) 20 MEQ tablet Take 2 tablets (40 mEq total) by mouth daily. 60 tablet 4  . sacubitril-valsartan (ENTRESTO) 97-103 MG Take 1 tablet by mouth 2 (two) times daily. 180 tablet 3  . spironolactone (ALDACTONE) 25 MG tablet Take 1 tablet (25 mg total) by mouth daily. 30 tablet 3   No current facility-administered medications for this encounter.    No Known Allergies    Social History   Socioeconomic History  . Marital status: Single    Spouse name: Not on file  . Number of children: Not on file  . Years of education: Not on file  . Highest education level: Not on file  Occupational History  . Not on file  Tobacco Use  . Smoking status: Former Games developer  . Smokeless tobacco: Never Used  . Tobacco comment: quit in January  Vaping Use  . Vaping Use: Never  used  Substance and Sexual Activity  . Alcohol use: No  . Drug use: No  . Sexual activity: Not on file  Other Topics Concern  . Not on file  Social History Narrative  . Not on file   Social Determinants of Health   Financial Resource Strain:   . Difficulty of Paying Living Expenses: Not on file  Food Insecurity:   . Worried About Programme researcher, broadcasting/film/video in the Last Year: Not on file  . Ran Out of Food in the Last Year: Not on file  Transportation Needs:   . Lack of Transportation (Medical): Not on file  . Lack of Transportation (Non-Medical): Not on file  Physical Activity:   . Days of  Exercise per Week: Not on file  . Minutes of Exercise per Session: Not on file  Stress:   . Feeling of Stress : Not on file  Social Connections:   . Frequency of Communication with Friends and Family: Not on file  . Frequency of Social Gatherings with Friends and Family: Not on file  . Attends Religious Services: Not on file  . Active Member of Clubs or Organizations: Not on file  . Attends Banker Meetings: Not on file  . Marital Status: Not on file  Intimate Partner Violence:   . Fear of Current or Ex-Partner: Not on file  . Emotionally Abused: Not on file  . Physically Abused: Not on file  . Sexually Abused: Not on file      Family History  Problem Relation Age of Onset  . Hyperlipidemia Mother   . Diabetes Mother   . Stroke Mother   . Hypertension Mother   . Hypertension Father     Vitals:   03/27/20 0945  BP: (!) 140/108  Pulse: 65  SpO2: 97%  Weight: 114.1 kg (251 lb 9.6 oz)   PHYSICAL EXAM: General: NAD Neck: No JVD, no thyromegaly or thyroid nodule.  Lungs: Clear to auscultation bilaterally with normal respiratory effort. CV: Nondisplaced PMI.  Heart regular S1/S2, no S3/S4, no murmur.  No peripheral edema.  No carotid bruit.  Normal pedal pulses.  Abdomen: Soft, nontender, no hepatosplenomegaly, no distention.  Skin: Intact without lesions or rashes.  Neurologic: Alert and oriented x 3.  Psych: Normal affect. Extremities: No clubbing or cyanosis.  HEENT: Normal.   ASSESSMENT & PLAN:  1. Chest pain: Patient had cath in 2020 without significant CAD.  He has had several episodes of substernal chest tightness, sometimes with exertion and sometimes at rest.  ECG shows LVH with likely repolarization abnormality.   - Given ongoing symptoms, will arrange for coronary CT angiogram to rule out obstructive CAD.  2. Chronic systolic CHF: Nonischemic cardiomyopathy.  Echo 10/20 w/ reduced LVEF 25-30% with mild LV dilation and mild LV hypertrophy, mildly  decreased RV systolic function. Cause of cardiomyopathy may be long-standing uncontrolled HTN versus viral myocarditis. LHC/RHC showed normal filling pressures and cardiac output after diuresis, no coronary disease on angiography. No drugs/ETOH/smoking. Mother with "heart disease" but he is not clear on the diagnosis.  Echo in 1/21 with significant improvement, EF up to 55-60% with medical treatment. NYHA class I symptoms, euvolemic.  - Continue Entresto 97/103 bid - No Lasix needed.   - Continue spironolactone 25 mg daily.  Recent BMET stable.  - With elevated BP, increase Coreg to 12.5 mg bid.    - Continue Bidil 2 tabs tid.  - Repeat echo with followup in 6 months to make sure  that EF remains back to normal.  3. HTN: BP high today, increase Coreg as above.   Followup with me in 6 months with echo if coronary CT is unremarkable.     Marca Ancona, MD 03/27/20

## 2020-03-27 NOTE — Patient Instructions (Signed)
INCREASE Coreg to 12.5 mg, one tab twice a day  You have been referred to [facility] Radiology-Coronary CTA -they will be in contact for an appointment  Your physician recommends that you schedule a follow-up appointment in: 6 months with Dr Shirlee Latch and echo  Your physician has requested that you have an echocardiogram. Echocardiography is a painless test that uses sound waves to create images of your heart. It provides your doctor with information about the size and shape of your heart and how well your heart's chambers and valves are working. This procedure takes approximately one hour. There are no restrictions for this procedure.   If you have any questions or concerns before your next appointment please send [place] a message through [place] or call our office at [phone].    TO LEAVE A MESSAGE FOR THE NURSE SELECT OPTION 2, PLEASE LEAVE A MESSAGE INCLUDING: . YOUR NAME . DATE OF BIRTH . CALL BACK NUMBER . REASON FOR CALL**this is important as we prioritize the call backs  YOU WILL RECEIVE A CALL BACK THE SAME DAY AS LONG AS YOU CALL BEFORE 4:00 PM

## 2020-03-28 MED FILL — CARVEDILOL 12.5 MG TABLET: 12.5 | 30 days supply | Qty: 60 | Fill #0

## 2020-04-03 ENCOUNTER — Telehealth (HOSPITAL_COMMUNITY): Payer: Self-pay | Admitting: Licensed Clinical Social Worker

## 2020-04-03 NOTE — Telephone Encounter (Signed)
CSW consulted to speak with pt regarding current lack of insurance.  Pt needing upcoming Cardiac CT and would need to self pay 50% prior to procedure without insurance.  Discussed with pt who believes new insurance took effect 11/1- pt texted picture of card to CSW who provided to clinic staff- they will verify pt benefits and proceed with scheduling  Burna Sis, LCSW Clinical Social Worker Advanced [facility] Failure Clinic Desk#: [phone] Cell#: [phone]

## 2020-04-12 ENCOUNTER — Emergency Department (HOSPITAL_BASED_OUTPATIENT_CLINIC_OR_DEPARTMENT_OTHER)
Admission: EM | Admit: 2020-04-12 | Discharge: 2020-04-13 | Disposition: A | Discharge status: Home / Self Care | Payer: 59 | Attending: Emergency Medicine | Admitting: Emergency Medicine

## 2020-04-12 ENCOUNTER — Emergency Department (HOSPITAL_BASED_OUTPATIENT_CLINIC_OR_DEPARTMENT_OTHER): Payer: 59

## 2020-04-12 ENCOUNTER — Other Ambulatory Visit (HOSPITAL_BASED_OUTPATIENT_CLINIC_OR_DEPARTMENT_OTHER): Payer: Self-pay | Admitting: Emergency Medicine

## 2020-04-12 ENCOUNTER — Other Ambulatory Visit: Payer: Self-pay

## 2020-04-12 ENCOUNTER — Encounter (HOSPITAL_BASED_OUTPATIENT_CLINIC_OR_DEPARTMENT_OTHER): Payer: Self-pay | Admitting: *Deleted

## 2020-04-12 DIAGNOSIS — Z87891 Personal history of nicotine dependence: Secondary | ICD-10-CM | POA: Insufficient documentation

## 2020-04-12 DIAGNOSIS — R072 Precordial pain: Secondary | ICD-10-CM | POA: Diagnosis present

## 2020-04-12 DIAGNOSIS — I5022 Chronic systolic (congestive) heart failure: Secondary | ICD-10-CM | POA: Diagnosis not present

## 2020-04-12 DIAGNOSIS — N182 Chronic kidney disease, stage 2 (mild): Secondary | ICD-10-CM | POA: Diagnosis not present

## 2020-04-12 DIAGNOSIS — Z955 Presence of coronary angioplasty implant and graft: Secondary | ICD-10-CM | POA: Diagnosis not present

## 2020-04-12 DIAGNOSIS — I130 Hypertensive heart and chronic kidney disease with heart failure and stage 1 through stage 4 chronic kidney disease, or unspecified chronic kidney disease: Secondary | ICD-10-CM | POA: Diagnosis not present

## 2020-04-12 DIAGNOSIS — R0602 Shortness of breath: Secondary | ICD-10-CM | POA: Diagnosis not present

## 2020-04-12 LAB — COMPREHENSIVE METABOLIC PANEL
ALT: 17 U/L (ref 0–44)
AST: 20 U/L (ref 15–41)
Albumin: 4 g/dL (ref 3.5–5.0)
Alkaline Phosphatase: 88 U/L (ref 38–126)
Anion gap: 9 (ref 5–15)
BUN: 11 mg/dL (ref 6–20)
CO2: 23 mmol/L (ref 22–32)
Calcium: 8.7 mg/dL — ABNORMAL LOW (ref 8.9–10.3)
Chloride: 105 mmol/L (ref 98–111)
Creatinine, Ser: 1.63 mg/dL — ABNORMAL HIGH (ref 0.61–1.24)
GFR, Estimated: 56 mL/min — ABNORMAL LOW (ref >60)
Glucose, Bld: 151 mg/dL — ABNORMAL HIGH (ref 70–99)
Potassium: 3.6 mmol/L (ref 3.5–5.1)
Sodium: 137 mmol/L (ref 135–145)
Total Bilirubin: 0.3 mg/dL (ref 0.3–1.2)
Total Protein: 7.8 g/dL (ref 6.5–8.1)

## 2020-04-12 LAB — CBC WITH DIFFERENTIAL/PLATELET
Abs Immature Granulocytes: 0.02 10*3/uL (ref 0.00–0.07)
Basophils Absolute: 0 10*3/uL (ref 0.0–0.1)
Basophils Relative: 0 %
Eosinophils Absolute: 0.2 10*3/uL (ref 0.0–0.5)
Eosinophils Relative: 3 %
HCT: 39.4 % (ref 39.0–52.0)
Hemoglobin: 12.6 g/dL — ABNORMAL LOW (ref 13.0–17.0)
Immature Granulocytes: 0 %
Lymphocytes Relative: 43 %
Lymphs Abs: 3.3 10*3/uL (ref 0.7–4.0)
MCH: 23.4 pg — ABNORMAL LOW (ref 26.0–34.0)
MCHC: 32 g/dL (ref 30.0–36.0)
MCV: 73.2 fL — ABNORMAL LOW (ref 80.0–100.0)
Monocytes Absolute: 0.5 10*3/uL (ref 0.1–1.0)
Monocytes Relative: 6 %
Neutro Abs: 3.7 10*3/uL (ref 1.7–7.7)
Neutrophils Relative %: 48 %
Platelets: 274 10*3/uL (ref 150–400)
RBC: 5.38 MIL/uL (ref 4.22–5.81)
RDW: 15.3 % (ref 11.5–15.5)
WBC: 7.7 10*3/uL (ref 4.0–10.5)
nRBC: 0 % (ref 0.0–0.2)

## 2020-04-12 LAB — TROPONIN I (HIGH SENSITIVITY)
Troponin I (High Sensitivity): 11 ng/L (ref <18)
Troponin I (High Sensitivity): 11 ng/L (ref <18)

## 2020-04-12 LAB — BRAIN NATRIURETIC PEPTIDE: B Natriuretic Peptide: 49.9 pg/mL (ref 0.0–100.0)

## 2020-04-12 MED ORDER — ALUM & MAG HYDROXIDE-SIMETH 200-200-20 MG/5ML PO SUSP
15.0000 mL | Freq: Once | ORAL | Status: AC
  Administered 2020-04-12: 15 mL via ORAL
  Filled 2020-04-12: qty 30

## 2020-04-12 MED ORDER — PANTOPRAZOLE SODIUM 40 MG PO TBEC: 40.0000 mg | DELAYED_RELEASE_TABLET | Freq: Every day | ORAL | 0 refills | Status: DC

## 2020-04-12 MED FILL — SPIRONOLACTONE 25 MG TABLET: 25 | 30 days supply | Qty: 30 | Fill #2

## 2020-04-12 MED FILL — POTASSIUM CL ER 20 MEQ TAB: 20 | 30 days supply | Qty: 60 | Fill #1

## 2020-04-12 NOTE — ED Provider Notes (Signed)
11:36 PM Assumed care from Dr. Jacqulyn Bath, please see their note for full history, physical and decision making until this point. In brief this is a 34 y.o. year old male who presented to the ED tonight with Chest Pain     Pending second troponin.   secondt troponin ok. Pain free. reviewed ecg, looks good. Will follow up with cardiology. Stable for discharge.   Discharge instructions, including strict return precautions for new or worsening symptoms, given. Patient and/or family verbalized understanding and agreement with the plan as described.   Labs, studies and imaging reviewed by myself and considered in medical decision making if ordered. Imaging interpreted by radiology.  Labs Reviewed  CBC WITH DIFFERENTIAL/PLATELET - Abnormal; Notable for the following components:      Result Value   Hemoglobin 12.6 (*)    MCV 73.2 (*)    MCH 23.4 (*)    All other components within normal limits  COMPREHENSIVE METABOLIC PANEL - Abnormal; Notable for the following components:   Glucose, Bld 151 (*)    Creatinine, Ser 1.63 (*)    Calcium 8.7 (*)    GFR, Estimated 56 (*)    All other components within normal limits  BRAIN NATRIURETIC PEPTIDE  TROPONIN I (HIGH SENSITIVITY)  TROPONIN I (HIGH SENSITIVITY)    DG Chest 2 View  Final Result      No follow-ups on file.    Marily Memos, MD 04/13/20 [phone]

## 2020-04-12 NOTE — ED Provider Notes (Signed)
Emergency Department Provider Note   I have reviewed the triage vital signs and the nursing notes.   HISTORY  Chief Complaint Chest Pain   HPI Derek Reed is a 34 y.o. male with PMH of HTN and nonischemic cardiomyopathy with recovered EF presents to the emergency department with chest pain intermittently over the past week.  He describes burning discomfort in his chest which is lower and slightly left.  Symptoms are intermittent without any clear provoking factors.  He is not feeling short of breath.  He is not having fevers or chills.  Pain is not associated with eating.  He is followed by Dr. Shirlee Latch with cardiology and compliant with his home medications. Pain is mild at this time.    Past Medical History:  Diagnosis Date  . Acute CHF (congestive heart failure) (HCC) 02/24/2019  . Anxiety   . Hypertension   . Obesity   . Peritonsillar abscess     Patient Active Problem List   Diagnosis Date Noted  . HTN (hypertension) 02/24/2019  . 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 GAD (generalized anxiety disorder) 03/26/2014  . BMI 36.0-36.9,adult 03/26/2014    Past Surgical History:  Procedure Laterality Date  . INCISION AND DRAINAGE OF PERITONSILLAR ABCESS    . RIGHT/LEFT HEART CATH AND CORONARY ANGIOGRAPHY N/A 02/24/2019   Procedure: RIGHT/LEFT HEART CATH AND CORONARY ANGIOGRAPHY;  Surgeon: Laurey Morale, MD;  Location: [facility] INVASIVE CV LAB;  Service: Cardiovascular;  Laterality: N/A;    Allergies Patient has no known allergies.  Family History  Problem Relation Age of Onset  . Hyperlipidemia Mother   . Diabetes Mother   . Stroke Mother   . Hypertension Mother   . Hypertension Father     Social History Social History   Tobacco Use  . Smoking status: Former Games developer  . Smokeless tobacco: Never Used  . Tobacco comment: quit in January  Vaping Use  . Vaping Use: Never used  Substance  Use Topics  . Alcohol use: No  . Drug use: No    Review of Systems  Constitutional: No fever/chills Eyes: No visual changes. ENT: No sore throat. Cardiovascular: Positive chest pain. Respiratory: Denies shortness of breath. Gastrointestinal: Positive epigastric abdominal pain.  No nausea, no vomiting.  No diarrhea.  No constipation. Genitourinary: Negative for dysuria. Musculoskeletal: Negative for back pain. Skin: Negative for rash. Neurological: Negative for headaches, focal weakness or numbness.  10-point ROS otherwise negative.  ____________________________________________   PHYSICAL EXAM:  VITAL SIGNS: ED Triage Vitals  Enc Vitals Group     BP 04/12/20 2032 (!) 147/114     Pulse Rate 04/12/20 2032 85     Resp 04/12/20 2032 16     Temp 04/12/20 2034 98.3 F (36.8 C)     Temp src --      SpO2 04/12/20 2032 98 %     Weight 04/12/20 2032 247 lb (112 kg)     Height 04/12/20 2032 5\' 6"  (1.676 m)   Constitutional: Alert and oriented. Well appearing and in no acute distress. Eyes: Conjunctivae are normal. Head: Atraumatic. Nose: No congestion/rhinnorhea. Mouth/Throat: Mucous membranes are moist. Neck: No stridor.  Cardiovascular: Normal rate, regular rhythm. Good peripheral circulation. Grossly normal heart sounds.   Respiratory: Normal respiratory effort.  No retractions. Lungs CTAB. Gastrointestinal: Soft and nontender. No distention.  Musculoskeletal: No lower extremity tenderness nor edema. No gross deformities of extremities. Neurologic:  Normal  speech and language. No gross focal neurologic deficits are appreciated.  Skin:  Skin is warm, dry and intact. No rash noted.  ____________________________________________   LABS (all labs ordered are listed, but only abnormal results are displayed)  Labs Reviewed  CBC WITH DIFFERENTIAL/PLATELET - Abnormal; Notable for the following components:      Result Value   Hemoglobin 12.6 (*)    MCV 73.2 (*)    MCH 23.4  (*)    All other components within normal limits  COMPREHENSIVE METABOLIC PANEL - Abnormal; Notable for the following components:   Glucose, Bld 151 (*)    Creatinine, Ser 1.63 (*)    Calcium 8.7 (*)    GFR, Estimated 56 (*)    All other components within normal limits  BRAIN NATRIURETIC PEPTIDE  TROPONIN I (HIGH SENSITIVITY)  TROPONIN I (HIGH SENSITIVITY)   ____________________________________________  EKG   EKG Interpretation  Date/Time:  Friday April 12 2020 20:28:53 EST Ventricular Rate:  81 PR Interval:  184 QRS Duration: 80 QT Interval:  356 QTC Calculation: 413 R Axis:   -32 Text Interpretation: Normal sinus rhythm Left axis deviation Minimal voltage criteria for LVH, may be normal variant ( R in aVL ) Abnormal ECG No significant change since last tracing No STEMI Confirmed by Alona Bene [phone]) on 04/12/2020 8:39:45 PM       ____________________________________________  RADIOLOGY  DG Chest 2 View  Result Date: 04/12/2020 CLINICAL DATA:  Left-sided chest pain. EXAM: CHEST - 2 VIEW COMPARISON:  March 20, 2020 FINDINGS: The heart size and mediastinal contours are within normal limits. Both lungs are clear. The visualized skeletal structures are unremarkable. IMPRESSION: No active cardiopulmonary disease. Electronically Signed   By: Gerome Sam III M.D   On: 04/12/2020 20:58    ____________________________________________   PROCEDURES  Procedure(s) performed:   Procedures  None  ____________________________________________   INITIAL IMPRESSION / ASSESSMENT AND PLAN / ED COURSE  Pertinent labs & imaging results that were available during my care of the patient were reviewed by me and considered in my medical decision making (see chart for details).   Patient presents emergency department with chest pain which is atypical for ACS.  He last had a heart catheterization in 2020 which showed minimal CAD per review of cardiology notes.  He has been  compliant with his home medications.  Suspect GI source clinically.  He does not appear acutely volume overloaded.  Labs here are overall reassuring.  Will obtain a repeat troponin for completeness and have him follow with his primary care doctor and cardiology team as scheduled.   Labs reassuring. CXR reviewed. Second troponin pending. Care transferred to Dr. Clayborne Dana.  ____________________________________________  FINAL CLINICAL IMPRESSION(S) / ED DIAGNOSES  Final diagnoses:  Precordial chest pain     MEDICATIONS GIVEN DURING THIS VISIT:  Medications  alum & mag hydroxide-simeth (MAALOX/MYLANTA) 200-200-20 MG/5ML suspension 15 mL (15 mLs Oral Given 04/12/20 2309)     NEW OUTPATIENT MEDICATIONS STARTED DURING THIS VISIT:  Discharge Medication List as of 04/12/2020 11:47 PM    START taking these medications   Details  pantoprazole (PROTONIX) 40 MG tablet Take 1 tablet (40 mg total) by mouth daily., Starting Fri 04/12/2020, Until Sun 05/12/2020, Normal        Note:  This document was prepared using Dragon voice recognition software and may include unintentional dictation errors.  Alona Bene, MD, [facility] Emergency Medicine    Mark Hassey, Arlyss Repress, MD 04/13/20 1049

## 2020-04-12 NOTE — Discharge Instructions (Signed)
You were seen in the emergency room today with chest discomfort.  Your emergency department work-up was overall reassuring.  If you develop new or suddenly worsening symptoms please return but otherwise follow with your primary care doctor as well as your cardiology team moving forward.  Please continue with your home medications.  I have called in a medication to your pharmacy which you can pick up tomorrow which should help with your symptoms Derek Reed-term.

## 2020-04-12 NOTE — ED Triage Notes (Signed)
C/o chest pain x 1 week

## 2020-04-15 MED FILL — PANTOPRAZOLE SOD DR 40 MG T: 40 | 30 days supply | Qty: 30 | Fill #0

## 2020-04-17 ENCOUNTER — Ambulatory Visit: Payer: Self-pay | Admitting: Critical Care Medicine

## 2020-04-19 ENCOUNTER — Telehealth (HOSPITAL_COMMUNITY): Payer: Self-pay | Admitting: Emergency Medicine

## 2020-04-19 NOTE — Telephone Encounter (Signed)
Attempted to call patient regarding upcoming cardiac CT appointment. °Left message on voicemail with name and callback number °Katera Rybka RN Navigator Cardiac Imaging °[facility] °[phone] Office °[phone] Cell ° °

## 2020-04-22 ENCOUNTER — Other Ambulatory Visit: Payer: Self-pay

## 2020-04-22 ENCOUNTER — Ambulatory Visit (HOSPITAL_COMMUNITY)
Admission: RE | Admit: 2020-04-22 | Discharge: 2020-04-22 | Disposition: A | Discharge status: Home / Self Care | Payer: 59 | Source: Ambulatory Visit | Attending: Cardiology | Admitting: Cardiology

## 2020-04-22 DIAGNOSIS — R079 Chest pain, unspecified: Secondary | ICD-10-CM

## 2020-04-22 MED ORDER — NITROGLYCERIN 0.4 MG SL SUBL
SUBLINGUAL_TABLET | SUBLINGUAL | Status: AC
  Filled 2020-04-22: qty 2

## 2020-04-22 MED ORDER — NITROGLYCERIN 0.4 MG SL SUBL
0.8000 mg | SUBLINGUAL_TABLET | Freq: Once | SUBLINGUAL | Status: AC
  Administered 2020-04-22: 08:00:00 0.8 mg via SUBLINGUAL

## 2020-04-22 MED ORDER — IOHEXOL 350 MG/ML SOLN
80.0000 mL | Freq: Once | INTRAVENOUS | Status: AC | PRN
  Administered 2020-04-22: 08:00:00 80 mL via INTRAVENOUS

## 2020-04-26 MED FILL — CARVEDILOL 12.5 MG TABLET: 12.5 | 30 days supply | Qty: 60 | Fill #1

## 2020-05-15 ENCOUNTER — Other Ambulatory Visit: Payer: Self-pay | Admitting: Critical Care Medicine

## 2020-05-15 MED ORDER — SPIRONOLACTONE 25 MG PO TABS
25.0000 mg | ORAL_TABLET | Freq: Every day | ORAL | 0 refills | Status: DC
Start: 2020-05-15 — End: 2020-06-04

## 2020-05-15 MED FILL — SPIRONOLACTONE 25 MG TABLET: 25 | 30 days supply | Qty: 30 | Fill #3

## 2020-05-15 MED FILL — POTASSIUM CL ER 20 MEQ TAB: 20 | 30 days supply | Qty: 60 | Fill #2

## 2020-05-15 NOTE — Telephone Encounter (Signed)
Medication:  spironolactone (ALDACTONE) 25 MG tablet [[id number]]  potassium chloride SA (KLOR-CON) 20 MEQ tablet [[id number]]  pantoprazole (PROTONIX) 40 MG tablet [[id number]]  Has the patient contacted their pharmacy? No  (Agent: If no, request that the patient contact the pharmacy for the refill.)  Preferred Pharmacy (with phone number or street name):  [facility] [place], [place] - [address]  [address], [place] [address]  Phone:  [phone] Fax:  [phone]  Agent: Please be advised that RX refills may take up to 3 business days. We ask that you follow-up with your pharmacy.

## 2020-05-15 NOTE — Telephone Encounter (Signed)
Notes to clinic:  medication filled by a different provider  Review for refills   Requested Prescriptions  Pending Prescriptions Disp Refills   potassium chloride SA (KLOR-CON) 20 MEQ tablet 60 tablet 4    Sig: Take 2 tablets (40 mEq total) by mouth daily.      Endocrinology:  Minerals - Potassium Supplementation Failed - 05/15/2020  9:19 AM      Failed - Cr in normal range and within 360 days    Creat  Date Value Ref Range Status  03/26/2014 1.21 0.50 - 1.35 mg/dL Final   Creatinine, Ser  Date Value Ref Range Status  04/12/2020 1.63 (H) 0.61 - 1.24 mg/dL Final          Passed - K in normal range and within 360 days    Potassium  Date Value Ref Range Status  04/12/2020 3.6 3.5 - 5.1 mmol/L Final          Passed - Valid encounter within last 12 months    Recent Outpatient Visits           7 months ago Chronic systolic heart failure (HCC)due to Hypertension   [facility] Storm Frisk, MD   11 months ago Chronic systolic heart failure (HCC)due to Hypertension   [facility] Storm Frisk, MD   1 year ago Need for influenza vaccination   [facility] Drucilla Chalet, RPH-CPP   1 year ago Chronic systolic heart failure (HCC)due to Hypertension   [facility] Storm Frisk, MD   1 year ago Chronic systolic heart failure (HCC)due to Hypertension   [facility] Storm Frisk, MD       Future Appointments             In 2 weeks Storm Frisk, MD [facility]               pantoprazole (PROTONIX) 40 MG tablet 30 tablet 0    Sig: Take 1 tablet (40 mg total) by mouth daily.      Gastroenterology: Proton Pump Inhibitors Passed - 05/15/2020  9:19 AM      Passed - Valid encounter within last 12 months    Recent Outpatient Visits           7 months ago Chronic  systolic heart failure (HCC)due to Hypertension   [facility] Storm Frisk, MD   11 months ago Chronic systolic heart failure (HCC)due to Hypertension   [facility] Storm Frisk, MD   1 year ago Need for influenza vaccination   [facility] Drucilla Chalet, RPH-CPP   1 year ago Chronic systolic heart failure (HCC)due to Hypertension   [facility] Storm Frisk, MD   1 year ago Chronic systolic heart failure (HCC)due to Hypertension   [facility] Storm Frisk, MD       Future Appointments             In 2 weeks Storm Frisk, MD [facility]              Signed Prescriptions Disp Refills   spironolactone (ALDACTONE) 25 MG tablet 30 tablet 0    Sig: Take 1 tablet (  25 mg total) by mouth daily.      Cardiovascular: Diuretics - Aldosterone Antagonist Failed - 05/15/2020  9:19 AM      Failed - Cr in normal range and within 360 days    Creat  Date Value Ref Range Status  03/26/2014 1.21 0.50 - 1.35 mg/dL Final   Creatinine, Ser  Date Value Ref Range Status  04/12/2020 1.63 (H) 0.61 - 1.24 mg/dL Final          Failed - Last BP in normal range    BP Readings from Last 1 Encounters:  04/22/20 (!) 142/94          Failed - Valid encounter within last 6 months    Recent Outpatient Visits           7 months ago Chronic systolic heart failure (HCC)due to Hypertension   [facility] Storm Frisk, MD   11 months ago Chronic systolic heart failure (HCC)due to Hypertension   [facility] Storm Frisk, MD   1 year ago Need for influenza vaccination   [facility] Drucilla Chalet, RPH-CPP   1 year ago Chronic systolic heart failure (HCC)due to Hypertension   [facility] Storm Frisk, MD   1 year ago Chronic systolic heart failure (HCC)due to Hypertension   [facility] Storm Frisk, MD       Future Appointments             In 2 weeks Storm Frisk, MD [facility]             Passed - K in normal range and within 360 days    Potassium  Date Value Ref Range Status  04/12/2020 3.6 3.5 - 5.1 mmol/L Final          Passed - Na in normal range and within 360 days    Sodium  Date Value Ref Range Status  04/12/2020 137 135 - 145 mmol/L Final

## 2020-05-17 ENCOUNTER — Other Ambulatory Visit: Payer: Self-pay | Admitting: Critical Care Medicine

## 2020-05-17 MED ORDER — POTASSIUM CHLORIDE CRYS ER 20 MEQ PO TBCR
40.0000 meq | EXTENDED_RELEASE_TABLET | Freq: Every day | ORAL | 0 refills | Status: DC
Start: 2020-05-17 — End: 2020-06-04

## 2020-05-17 MED ORDER — PANTOPRAZOLE SODIUM 40 MG PO TBEC: 40.0000 mg | DELAYED_RELEASE_TABLET | Freq: Every day | ORAL | 0 refills | Status: DC

## 2020-05-17 MED FILL — PANTOPRAZOLE SOD DR 40 MG T: 40 | 30 days supply | Qty: 30 | Fill #0

## 2020-05-29 MED FILL — CARVEDILOL 12.5 MG TABLET: 12.5 | 30 days supply | Qty: 60 | Fill #2

## 2020-06-04 ENCOUNTER — Other Ambulatory Visit: Payer: Self-pay

## 2020-06-04 ENCOUNTER — Other Ambulatory Visit: Payer: Self-pay | Admitting: Critical Care Medicine

## 2020-06-04 ENCOUNTER — Telehealth: Payer: Self-pay

## 2020-06-04 ENCOUNTER — Encounter: Payer: Self-pay | Admitting: Critical Care Medicine

## 2020-06-04 ENCOUNTER — Ambulatory Visit: Payer: 59 | Attending: Critical Care Medicine | Admitting: Critical Care Medicine

## 2020-06-04 DIAGNOSIS — Z23 Encounter for immunization: Secondary | ICD-10-CM

## 2020-06-04 DIAGNOSIS — Z6836 Body mass index (BMI) 36.0-36.9, adult: Secondary | ICD-10-CM | POA: Diagnosis not present

## 2020-06-04 DIAGNOSIS — I5022 Chronic systolic (congestive) heart failure: Secondary | ICD-10-CM

## 2020-06-04 DIAGNOSIS — I10 Essential (primary) hypertension: Secondary | ICD-10-CM | POA: Diagnosis not present

## 2020-06-04 DIAGNOSIS — N182 Chronic kidney disease, stage 2 (mild): Secondary | ICD-10-CM | POA: Diagnosis not present

## 2020-06-04 MED ORDER — PANTOPRAZOLE SODIUM 40 MG PO TBEC: 40.0000 mg | DELAYED_RELEASE_TABLET | Freq: Every day | ORAL | 1 refills | Status: DC

## 2020-06-04 MED ORDER — SACUBITRIL-VALSARTAN 97-103 MG PO TABS
1.0000 | ORAL_TABLET | Freq: Two times a day (BID) | ORAL | 3 refills | Status: DC
Start: 2020-06-04 — End: 2020-06-04

## 2020-06-04 MED ORDER — CARVEDILOL 12.5 MG PO TABS
12.5000 mg | ORAL_TABLET | Freq: Two times a day (BID) | ORAL | 3 refills | Status: DC
Start: 2020-06-04 — End: 2020-06-04

## 2020-06-04 MED ORDER — POTASSIUM CHLORIDE CRYS ER 20 MEQ PO TBCR: 20.0000 meq | EXTENDED_RELEASE_TABLET | Freq: Two times a day (BID) | ORAL | 3 refills | Status: DC

## 2020-06-04 MED ORDER — SPIRONOLACTONE 25 MG PO TABS: 25.0000 mg | ORAL_TABLET | Freq: Every day | ORAL | 1 refills | Status: DC

## 2020-06-04 MED ORDER — BIDIL 20-37.5 MG PO TABS
2.0000 | ORAL_TABLET | Freq: Three times a day (TID) | ORAL | 3 refills | Status: DC
Start: 2020-06-04 — End: 2020-06-04

## 2020-06-04 MED FILL — PANTOPRAZOLE SOD DR 40 MG T: 40 | 30 days supply | Qty: 30 | Fill #0

## 2020-06-04 MED FILL — BIDIL 20-37.5 MG TABS: 20-37.5 | 30 days supply | Qty: 180 | Fill #0

## 2020-06-04 NOTE — Assessment & Plan Note (Signed)
BMI now up to 40 we discussed dietary measures

## 2020-06-04 NOTE — Patient Instructions (Signed)
We discussed you obtain a Covid vaccine you run to make a decision on this below is where you can obtain COVID-19 Vaccine Information can be found at: PodExchange.nl For questions related to vaccine distribution or appointments, please email [email or web address] or call [phone].   Flu vaccine was given  No change in medications refills on your reflux medicine pantoprazole was sent to our pharmacy  Follow reflux diet as outlined below  Return to see Dr. Delford Field in 5 months  ' Food Choices for Gastroesophageal Reflux Disease, Adult When you have gastroesophageal reflux disease (GERD), the foods you eat and your eating habits are very important. Choosing the right foods can help ease your discomfort. Think about working with a food expert (dietitian) to help you make good choices. What are tips for following this plan? Reading food labels  Look for foods that are low in saturated fat. Foods that may help with your symptoms include: ? Foods that have less than 5% of daily value (DV) of fat. ? Foods that have 0 grams of trans fat. Cooking  Do not fry your food.  Cook your food by baking, steaming, grilling, or broiling. These are all methods that do not need a lot of fat for cooking.  To add flavor, try to use herbs that are low in spice and acidity. Meal planning  Choose healthy foods that are low in fat, such as: ? Fruits and vegetables. ? Whole grains. ? Low-fat dairy products. ? Lean meats, fish, and poultry.  Eat small meals often instead of eating 3 large meals each day. Eat your meals slowly in a place where you are relaxed. Avoid bending over or lying down until 2-3 hours after eating.  Limit high-fat foods such as fatty meats or fried foods.  Limit your intake of fatty foods, such as oils, butter, and shortening.  Avoid the following as told by your doctor: ? Foods that cause symptoms. These may be  different for different people. Keep a food diary to keep track of foods that cause symptoms. ? Alcohol. ? Drinking a lot of liquid with meals. ? Eating meals during the 2-3 hours before bed.   Lifestyle  Stay at a healthy weight. Ask your doctor what weight is healthy for you. If you need to lose weight, work with your doctor to do so safely.  Exercise for at least 30 minutes on 5 or more days each week, or as told by your doctor.  Wear loose-fitting clothes.  Do not smoke or use any products that contain nicotine or tobacco. If you need help quitting, ask your doctor.  Sleep with the head of your bed higher than your feet. Use a wedge under the mattress or blocks under the bed frame to raise the head of the bed.  Chew sugar-free gum after meals. What foods should eat? Eat a healthy, well-balanced diet of fruits, vegetables, whole grains, low-fat dairy products, lean meats, fish, and poultry. Each person is different. Foods that may cause symptoms in one person may not cause any symptoms in another person. Work with your doctor to find foods that are safe for you. The items listed above may not be a complete list of what you can eat and drink. Contact a food expert for more options.   What foods should I avoid? Limiting some of these foods may help in managing the symptoms of GERD. Everyone is different. Talk with a food expert or your doctor to help you find the exact foods to  avoid, if any. Fruits Any fruits prepared with added fat. Any fruits that cause symptoms. For some people, this may include citrus fruits, such as oranges, grapefruit, pineapple, and lemons. Vegetables Deep-fried vegetables. [place] fries. Any vegetables prepared with added fat. Any vegetables that cause symptoms. For some people, this may include tomatoes and tomato products, chili peppers, onions and garlic, and horseradish. Grains Pastries or quick breads with added fat. Meats and other proteins High-fat  meats, such as fatty beef or pork, hot dogs, ribs, ham, sausage, salami, and bacon. Fried meat or protein, including fried fish and fried chicken. Nuts and nut butters, in large amounts. Dairy Whole milk and chocolate milk. Sour cream. Cream. Ice cream. Cream cheese. Milkshakes. Fats and oils Butter. Margarine. Shortening. Ghee. Beverages Coffee and tea, with or without caffeine. Carbonated beverages. Sodas. Energy drinks. Fruit juice made with acidic fruits, such as orange or grapefruit. Tomato juice. Alcoholic drinks. Sweets and desserts Chocolate and cocoa. Donuts. Seasonings and condiments Pepper. Peppermint and spearmint. Added salt. Any condiments, herbs, or seasonings that cause symptoms. For some people, this may include curry, hot sauce, or vinegar-based salad dressings. The items listed above may not be a complete list of what you should not eat and drink. Contact a food expert for more options. Questions to ask your doctor Diet and lifestyle changes are often the first steps that are taken to manage symptoms of GERD. If diet and lifestyle changes do not help, talk with your doctor about taking medicines. Where to find more information  International Foundation for Gastrointestinal Disorders: aboutgerd.org Summary  When you have GERD, food and lifestyle choices are very important in easing your symptoms.  Eat small meals often instead of 3 large meals a day. Eat your meals slowly and in a place where you are relaxed.  Avoid bending over or lying down until 2-3 hours after eating.  Limit high-fat foods such as fatty meats or fried foods. This information is not intended to replace advice given to you by your health care provider. Make sure you discuss any questions you have with your health care provider. Document Revised: 11/06/2019 Document Reviewed: 11/06/2019 Elsevier Patient Education  2021 [employer].

## 2020-06-04 NOTE — Assessment & Plan Note (Signed)
Chronic kidney disease stable at this time follow-up labs per cardiology

## 2020-06-04 NOTE — Telephone Encounter (Signed)
PRIOR AUTH FOR ENTRESTO HAS BEEN APPROVED UNTIL 06/04/21

## 2020-06-04 NOTE — Assessment & Plan Note (Signed)
Chronic systolic heart failure due to hypertension stable at this time  Continue current cardiac medications refill sent to our pharmacy

## 2020-06-04 NOTE — Assessment & Plan Note (Signed)
Patient at goal continue current medication profile

## 2020-06-04 NOTE — Progress Notes (Signed)
F/u chest pain from UC visit 04/12/2020.   States he still has intermittent chest pain  Denies pain right now When he has discomfort, rates at 3/10  Concerns with anxiety. More so when he gets off work.

## 2020-06-04 NOTE — Progress Notes (Signed)
Subjective:    Patient ID: Derek Reed, male    DOB: [birth year], 35 y.o.   MRN: [id number]  35 y.o.M with telemedicine phone visit for CHF f/u and to establish for Primary care . This patient was admitted in October for acute pulmonary edema and hypertensive cardiomyopathy discharge summary is as below  Admit date:02/23/2019 Discharge date:02/26/2019  Time spent:65mnutes  Recommendations for Outpatient Follow-up: [facility]. [facility], please check be met at follow-up   Discharge Diagnoses: Principal Problem: Acute systolic CHF Hypertensive cardiomyopathy Borderline diabetes Hypokalemia HTN (hypertension) Elevated troponin CKD (chronic kidney disease), stage II Anxiety Acute systolic [place]) [place]   Discharge Condition:Stable  Diet recommendation:Carb modified, heart healthy      Filed Weights  02/24/19 0528 02/25/19 0616 02/26/19 0554 Weight: 105.1 kg 108.8 kg 105.1 kg   History of present illness: Derek Hooperis a 35y.o.malewith medical history significant ofhypertension not taking medications, CKD-2, peritonsillar abscess, anxiety, who presented with shortness of breath, for more than 2 weeks. He has dry cough. - COVID-19 test was negative, EKG noted LVH Chest x-ray showed increased cardiac silhouette and interstitial edema   Hospital Course:  Acute CHF/new onset -Hypertensive cardiomyopathy, has LVH on EKG -Clinically improved with diuresis, he was -5 L at discharge -2D echo with EF of 25 to 30%,cardiology consultunderwent left heart cath 10/16which showed minimal nonobstructive disease, consistent with nonischemic cardiomyopathy -Transition to oral Lasix, Entresto, Coreg and Aldactone at discharge -Seen by case management for patient assistance, he was given a mSystems developerand EDelene Lollcard -Educated regarding diet and lifestyle modification  Elevated  troponin: -Troponin156 -->153. 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  Since discharge the patient has been in the advanced heart failure clinic for follow-up as below OP CV f/u 10/27: 35y/o [name] with PMH of untreated HTN [facility]. He presented with acute dyspnea and was found to be markedly hypertensive and in acute pulmonary edema. He was started on IV diuretics and antihypertensives. 2D echo showed severely reduced LVEF25-30% with mild LV dilation and mild LV hypertrophy, mildly decreased RV systolic function. LHC/RHC showed normal filling pressures and cardiac output after diuresis, no coronary disease on angiography. Noh/odrugs/ETOH/smoking. Mother with "heart disease" but he is not clear on the diagnosis.   Primary etiology for his HF was felt to be poorly controlled HTN. After he was diuresed back to euvolemic state, he was transitioned to PO lasix. Also treated w/ Entresto, Spironolactone and Coreg. Discharge weight was 231 lb.   He presents to clinic today for post hospital f/u. Doing well. Denies exertional dyspnea. No weight gain or LEE. Compliant w/ meds. Tolerating well. No side effects. Has modified diet. Trying to eat healthier and reading food labels. Trying to keep Na intake under 1500 mg/day. Office weight 233 lb today. BP 122/80.  1.  Systolic Heart Failure: Recent Echo 10/20 w/ reduced LVEF 25-30% with mild LV dilation and mild LV hypertrophy, mildly decreased RV systolic function.  Cause of cardiomyopathy may  be long-standing uncontrolled HTN. LHC/RHC showed normal filling pressures and cardiac output after diuresis, no coronary disease on angiography. No drugs/ETOH/smoking. Mother with "heart disease" but he is not clear on the diagnosis. -NYHA Class II, euvolemic on exam. Wt stable compared to recent hospital d/c wt -BP improved. 122/80 today.  -Check BMP today -ContinueEntresto to49-51bid - Reduce Lasix to 20 mg daily - Continue spironolactone25 mg daily.  - ContinueCoreg 3.125 mg bid.  -Add Bidil 1 tablet tid - we discussed importance of daily wts and low sodium diet  - f/u w/ PharmD in 3 weeks for further med titration. -will ultimately need repeat echo 3 months after med optimization  2.QIO:NGEXBMW improved. 122/80 today. -HF meds per above.   At the last cardiology visit the patient was to continue Entresto twice daily current dose but to reduce Lasix to 20 mg daily and begin BiDil 1 tablet 3 times daily along with continuing Coreg and spironolactone.  The patient states he did not obtain the BiDil as he cannot afford it at his private pharmacy as he is self-pay status.  The patient states his breathing is better he is having no cough or chest pain he has no edema in the lower extremities.  He is taking all his medications as prescribed.  He had questions on what to do with the potassium dose as he is about out.  Note his potassium level is 4.6 at the last cardiology visit and he is on Aldactone.  04/06/19 Since the last visit which was a telephone visit this is a face-to-face visit.  The patient is improved with less shortness of breath no edema no chest pain.  He has been maintaining his medications appropriately.  Note his blood pressure today was 120/80 on arrival.  He has increased his  furosemide to 40 mg daily There is no other change in his medication at this time except he has been started on the BiDil  06/06/2019 Patient is seen in follow-up for chronic systolic heart failure due to hypertension.  He recently saw cardiology and had his Coreg dose increased to 6.25 mg twice daily he is also on a higher dose of Entresto maintains Aldactone potassium supplement and BiDil he is only taking furosemide as needed at this time. Note he went to the emergency room on 19 January for an abscess behind the left ear and had it incised and drained and is finishing a course of doxycycline  The patient does relate today that he has significant amounts of stress and anxiety at this time  The patient's not drinking alcohol any longer and does not use tobacco products  Recent echocardiogram was repeated and showed ejection fraction up to 55 to 60% which is markedly improved from prior values  Blood pressure today is at 145/85 and is improved control   09/27/2019 The patient was seen in return follow-up for chronic systolic heart failure and hypertension.  The patient's been compliant with all of his medications.  He denies chest pain or shortness of breath.  He is now off diuretics.  He is following up with the heart failure clinic and maintaining all medications.  Currently the patient maintains Coreg 6.25 mg twice daily Entresto 97/1031 twice daily Aldactone 25 mg daily and BiDil at 20/37 2 tablets 3 times daily he also continues with potassium supplementation  The patient started a job working at [employer] to [employer] with this he continue with patient assistance with Delene Loll  06/04/2020 This patient is seen today  for primary care follow-up last seen in May 2021.  Patient's last heart failure visit in clinic was November 2021 with Dr. Algernon Huxley.  At that visit documentation is as noted below  Last seen CHF clinic 03/2020: 1. HTN 2. Chronic systolic CHF: Nonischemic  cardiomyopathy.  - Echo (10/20): EF 25-30%, mildly decreased RV systolic function.  - LHC/RHC (10/20): No significant CAD.  Mean RA 3, PA 32/10, mean PCWP 12, CI 2.69.  - Echo (1/21): EF 55-60%, mild LVH, normal RV.   Chest pain: Patient had cath in 2020 without significant CAD.  He has had several episodes of substernal chest tightness, sometimes with exertion and sometimes at rest.  ECG shows LVH with likely repolarization abnormality.   - Given ongoing symptoms, will arrange for coronary CT angiogram to rule out obstructive CAD.  2. Chronic systolic CHF: Nonischemic cardiomyopathy.  Echo 10/20 w/ reduced LVEF 25-30% with mild LV dilation and mild LV hypertrophy, mildly decreased RV systolic function. Cause of cardiomyopathy may be long-standing uncontrolled HTN versus viral myocarditis. LHC/RHC showed normal filling pressures and cardiac output after diuresis, no coronary disease on angiography. No drugs/ETOH/smoking. Mother with "heart disease" but he is not clear on the diagnosis.  Echo in 1/21 with significant improvement, EF up to 55-60% with medical treatment. NYHA class I symptoms, euvolemic.  -ContinueEntresto 97/103 bid - No Lasix needed.   - Continue spironolactone25 mg daily. Recent BMET stable.  - With elevated BP, increase Coreg to 12.5 mg bid.    - Continue Bidil 2 tabs tid.  - Repeat echo with followup in 6 months to make sure that EF remains back to normal.  3. HTN: BP high today, increase Coreg as above.   Patient did undergo a CT coronary study which showed no coronary lesions. The patient states his chest pain he was having is more of heartburn in nature he has occasional belching and he states when he takes the Protonix this relieves the symptoms.  He is out of this medication at this time.  Note the patient is unvaccinated for Covid and has severe Covid vaccine resistance.  Currently the patient denies any shortness of breath or other complaints.  On arrival blood  pressure is perfect at 122/80.  Patient is no longer smoking or drinking alcohol.  Patient's weight has gone down but still has a BMI of 40 Patient's diet is more compliant  Patient's been compliant with all of his medications as noted above per cardiology visit.  We are refilling all his medications here at the clinic.    Past Medical History:  Diagnosis Date  . Acute CHF (congestive heart failure) ([place]) 02/24/2019  . Anxiety   . Hypertension   . Obesity   . Peritonsillar abscess      Family History  Problem Relation Age of Onset  . Hyperlipidemia Mother   . Diabetes Mother   . Stroke Mother   . Hypertension Mother   . Hypertension Father      Social History   Socioeconomic History  . Marital status: Single    Spouse name: Not on file  . Number of children: Not on file  . Years of education: Not on file  . Highest education level: Not on file  Occupational History  . Not on file  Tobacco Use  . Smoking status: Former Research scientist (life sciences)  . Smokeless tobacco: Never Used  . Tobacco comment: quit in January  Vaping Use  . Vaping Use: Never used  Substance and Sexual Activity  .  Alcohol use: No  . Drug use: No  . Sexual activity: Not on file  Other Topics Concern  . Not on file  Social History Narrative  . Not on file   Social Determinants of Health   Financial Resource Strain: Not on file  Food Insecurity: Not on file  Transportation Needs: Not on file  Physical Activity: Not on file  Stress: Not on file  Social Connections: Not on file  Intimate Partner Violence: Not on file     No Known Allergies   Outpatient Medications Prior to Visit  Medication Sig Dispense Refill  . carvedilol (COREG) 12.5 MG tablet Take 1 tablet (12.5 mg total) by mouth 2 (two) times daily with a meal. 60 tablet 3  . isosorbide-hydrALAZINE (BIDIL) 20-37.5 MG tablet Take 2 tablets by mouth 3 (three) times daily. 540 tablet 3  . potassium chloride SA (KLOR-CON) 20 MEQ tablet Take 2 tablets  (40 mEq total) by mouth daily. (Patient taking differently: Take 20 mEq by mouth 2 (two) times daily.) 60 tablet 0  . sacubitril-valsartan (ENTRESTO) 97-103 MG Take 1 tablet by mouth 2 (two) times daily. 180 tablet 3  . spironolactone (ALDACTONE) 25 MG tablet Take 1 tablet (25 mg total) by mouth daily. 30 tablet 0  . furosemide (LASIX) 40 MG tablet Take 40 mg by mouth daily. (Patient not taking: Reported on 06/04/2020)    . pantoprazole (PROTONIX) 40 MG tablet Take 1 tablet (40 mg total) by mouth daily. (Patient not taking: Reported on 06/04/2020) 30 tablet 0   No facility-administered medications prior to visit.      Review of Systems Constitutional:   No  weight loss, night sweats,  Fevers, chills, fatigue, lassitude. HEENT:   No headaches,  Difficulty swallowing,  Tooth/dental problems,  Sore throat,                No sneezing, itching, ear ache, nasal congestion, post nasal drip,   CV:  No chest pain,  Orthopnea, PND, swelling in lower extremities, anasarca, dizziness, palpitations  GI  No heartburn, indigestion, abdominal pain, nausea, vomiting, diarrhea, change in bowel habits, loss of appetite  Resp: No shortness of breath with exertion or at rest.  No excess mucus, no productive cough,  No non-productive cough,  No coughing up of blood.  No change in color of mucus.  No wheezing.  No chest wall deformity  Skin: no rash or lesions.  GU: no dysuria, change in color of urine, no urgency or frequency.  No flank pain.  MS:  No joint pain or swelling.  No decreased range of motion.  No back pain.  Psych:   change in mood or affect.  depression or anxiety.  No memory loss.     Objective:   Physical Exam Vitals:   06/04/20 0846  BP: 122/80  Pulse: 73  Resp: 16  SpO2: 96%  Weight: 249 lb (112.9 kg)    Gen: Pleasant, well-nourished, in no distress,  normal affect  ENT: No lesions,  mouth clear,  oropharynx clear, no postnasal drip  Neck: No JVD, no TMG, no carotid  bruits  Lungs: No use of accessory muscles, no dullness to percussion, clear without rales or rhonchi  Cardiovascular: RRR, heart sounds normal, no murmur or gallops, no peripheral edema  Abdomen: soft and NT, no HSM,  BS normal  Musculoskeletal: No deformities, no cyanosis or clubbing  Neuro: alert, non focal  Skin: Warm, no lesions or rashes  No results found.  BMP  Latest Ref Rng & Units 04/12/2020 03/21/2020 03/20/2020  Glucose 70 - 99 mg/dL 151(H) 184(H) 102(H)  BUN 6 - 20 mg/dL $Remove'11 10 11  'IEEZIhn$ Creatinine 0.61 - 1.24 mg/dL 1.63(H) 1.47(H) 1.68(H)  Sodium 135 - 145 mmol/L 137 138 139  Potassium 3.5 - 5.1 mmol/L 3.6 3.6 3.8  Chloride 98 - 111 mmol/L 105 106 105  CO2 22 - 32 mmol/L 23 21(L) 25  Calcium 8.9 - 10.3 mg/dL 8.7(L) 8.8(L) 8.5(L)   12/21: Cor CT:  IMPRESSION: 1. Coronary artery calcium score 0 Agatston units, suggesting low risk for future cardiac events.  2.  No significant coronary disease noted.    Assessment & Plan:  I personally reviewed all images and lab data in the [facility] system as well as any outside material available during this office visit and agree with the  radiology impressions.   Chronic systolic heart failure (HCC)due to Hypertension Chronic systolic heart failure due to hypertension stable at this time  Continue current cardiac medications refill sent to our pharmacy  HTN (hypertension) Patient at goal continue current medication profile  CKD (chronic kidney disease), stage II Chronic kidney disease stable at this time follow-up labs per cardiology  BMI 36.0-36.9,adult BMI now up to 40 we discussed dietary measures   Diagnoses and all orders for this visit:  Chronic systolic heart failure (HCC)due to Hypertension  Primary hypertension  CKD (chronic kidney disease), stage II  BMI 36.0-36.9,adult  Other orders -     pantoprazole (PROTONIX) 40 MG tablet; Take 1 tablet (40 mg total) by mouth daily. -     potassium chloride SA  (KLOR-CON) 20 MEQ tablet; Take 1 tablet (20 mEq total) by mouth 2 (two) times daily. -     spironolactone (ALDACTONE) 25 MG tablet; Take 1 tablet (25 mg total) by mouth daily. -     isosorbide-hydrALAZINE (BIDIL) 20-37.5 MG tablet; Take 2 tablets by mouth 3 (three) times daily. -     carvedilol (COREG) 12.5 MG tablet; Take 1 tablet (12.5 mg total) by mouth 2 (two) times daily with a meal. -     sacubitril-valsartan (ENTRESTO) 97-103 MG; Take 1 tablet by mouth 2 (two) times daily. -     Flu Vaccine QUAD 6+ mos PF IM (Fluarix Quad PF)   I spent 10 minutes of this visit discussing the importance of the Covid vaccine the patient has quite a bit of misinformation and this information and also is basing his decision based on his local culture and individuals that he knows.  I tried to explain to him the scientific approach as to why the vaccines are important.  This patient still remains very hesitant but will give further consideration to this and I gave him a number he can call to obtain the vaccine  The patient agreed to the flu vaccine and we administered at this visit

## 2020-06-05 ENCOUNTER — Other Ambulatory Visit (HOSPITAL_COMMUNITY): Payer: Self-pay | Admitting: *Deleted

## 2020-06-05 DIAGNOSIS — I5022 Chronic systolic (congestive) heart failure: Secondary | ICD-10-CM

## 2020-06-06 ENCOUNTER — Other Ambulatory Visit (HOSPITAL_COMMUNITY): Payer: Self-pay

## 2020-06-12 ENCOUNTER — Ambulatory Visit (HOSPITAL_COMMUNITY)
Admission: RE | Admit: 2020-06-12 | Discharge: 2020-06-12 | Disposition: A | Discharge status: Home / Self Care | Payer: 59 | Source: Ambulatory Visit | Attending: Internal Medicine | Admitting: Internal Medicine

## 2020-06-12 ENCOUNTER — Other Ambulatory Visit: Payer: Self-pay

## 2020-06-12 DIAGNOSIS — I5022 Chronic systolic (congestive) heart failure: Secondary | ICD-10-CM | POA: Diagnosis not present

## 2020-06-12 LAB — BASIC METABOLIC PANEL
Anion gap: 7 (ref 5–15)
BUN: 15 mg/dL (ref 6–20)
CO2: 23 mmol/L (ref 22–32)
Calcium: 8.7 mg/dL — ABNORMAL LOW (ref 8.9–10.3)
Chloride: 107 mmol/L (ref 98–111)
Creatinine, Ser: 1.54 mg/dL — ABNORMAL HIGH (ref 0.61–1.24)
GFR, Estimated: 60 mL/min — ABNORMAL LOW (ref >60)
Glucose, Bld: 120 mg/dL — ABNORMAL HIGH (ref 70–99)
Potassium: 4 mmol/L (ref 3.5–5.1)
Sodium: 137 mmol/L (ref 135–145)

## 2020-06-14 MED FILL — POTASSIUM CL ER 20 MEQ TAB: 20 | 30 days supply | Qty: 60 | Fill #0

## 2020-06-14 MED FILL — SPIRONOLACTONE 25 MG TABLET: 25 | 30 days supply | Qty: 30 | Fill #0

## 2020-06-14 MED FILL — PANTOPRAZOLE SOD DR 40 MG T: 40 | 30 days supply | Qty: 30 | Fill #0

## 2020-06-28 MED FILL — CARVEDILOL 12.5 MG TABLET: 12.5 | 30 days supply | Qty: 60 | Fill #3

## 2020-07-15 MED FILL — PANTOPRAZOLE SOD DR 40 MG T: 40 | 30 days supply | Qty: 30 | Fill #1

## 2020-07-15 MED FILL — SPIRONOLACTONE 25 MG TABLET: 25 | 30 days supply | Qty: 30 | Fill #1

## 2020-07-15 MED FILL — POTASSIUM CL ER 20 MEQ TAB: 20 | 30 days supply | Qty: 60 | Fill #1

## 2020-07-23 ENCOUNTER — Other Ambulatory Visit: Payer: Self-pay

## 2020-07-23 ENCOUNTER — Emergency Department (HOSPITAL_BASED_OUTPATIENT_CLINIC_OR_DEPARTMENT_OTHER)
Admission: EM | Admit: 2020-07-23 | Discharge: 2020-07-24 | Disposition: A | Discharge status: Home / Self Care | Payer: 59 | Attending: Emergency Medicine | Admitting: Emergency Medicine

## 2020-07-23 ENCOUNTER — Emergency Department (HOSPITAL_BASED_OUTPATIENT_CLINIC_OR_DEPARTMENT_OTHER): Payer: 59

## 2020-07-23 ENCOUNTER — Encounter (HOSPITAL_BASED_OUTPATIENT_CLINIC_OR_DEPARTMENT_OTHER): Payer: Self-pay

## 2020-07-23 DIAGNOSIS — I110 Hypertensive heart disease with heart failure: Secondary | ICD-10-CM | POA: Insufficient documentation

## 2020-07-23 DIAGNOSIS — Z87891 Personal history of nicotine dependence: Secondary | ICD-10-CM | POA: Diagnosis not present

## 2020-07-23 DIAGNOSIS — Z79899 Other long term (current) drug therapy: Secondary | ICD-10-CM | POA: Diagnosis not present

## 2020-07-23 DIAGNOSIS — I509 Heart failure, unspecified: Secondary | ICD-10-CM | POA: Insufficient documentation

## 2020-07-23 DIAGNOSIS — R0789 Other chest pain: Secondary | ICD-10-CM

## 2020-07-23 LAB — CBC
HCT: 36.8 % — ABNORMAL LOW (ref 39.0–52.0)
Hemoglobin: 11.7 g/dL — ABNORMAL LOW (ref 13.0–17.0)
MCH: 23.4 pg — ABNORMAL LOW (ref 26.0–34.0)
MCHC: 31.8 g/dL (ref 30.0–36.0)
MCV: 73.5 fL — ABNORMAL LOW (ref 80.0–100.0)
Platelets: 275 10*3/uL (ref 150–400)
RBC: 5.01 MIL/uL (ref 4.22–5.81)
RDW: 16.1 % — ABNORMAL HIGH (ref 11.5–15.5)
WBC: 6.9 10*3/uL (ref 4.0–10.5)
nRBC: 0 % (ref 0.0–0.2)

## 2020-07-23 LAB — BASIC METABOLIC PANEL
Anion gap: 8 (ref 5–15)
BUN: 15 mg/dL (ref 6–20)
CO2: 22 mmol/L (ref 22–32)
Calcium: 8.5 mg/dL — ABNORMAL LOW (ref 8.9–10.3)
Chloride: 108 mmol/L (ref 98–111)
Creatinine, Ser: 1.67 mg/dL — ABNORMAL HIGH (ref 0.61–1.24)
GFR, Estimated: 54 mL/min — ABNORMAL LOW (ref >60)
Glucose, Bld: 100 mg/dL — ABNORMAL HIGH (ref 70–99)
Potassium: 3.8 mmol/L (ref 3.5–5.1)
Sodium: 138 mmol/L (ref 135–145)

## 2020-07-23 LAB — TROPONIN I (HIGH SENSITIVITY): Troponin I (High Sensitivity): 8 ng/L (ref <18)

## 2020-07-23 NOTE — ED Provider Notes (Signed)
[facility] Provider Note: Derek Dell, MD, FACEP  CSN: [id number] MRN: [id number] ARRIVAL: 07/23/20 at 2100 ROOM: MH06/MH06   CHIEF COMPLAINT  Chest Pain   HISTORY OF PRESENT ILLNESS  07/23/20 11:21 PM Derek Reed is a 35 y.o. male with about 3 days of chest pain.  The chest pain is intermittent and lasts for several minutes at a time.  It does not happen very frequently.  Nothing brings it on, makes it worse or makes it better, the pain is located below the left nipple and he describes it as a "flaring" pain.  It is well localized and does not radiate.  It is not associated with shortness of breath, diaphoresis or nausea.  He has had no cough or URI symptoms.  He rates the pain is a 2-3 at its worst.   Past Medical History:  Diagnosis Date  . Acute CHF (congestive heart failure) (HCC) 02/24/2019  . Anxiety   . Hypertension   . Obesity   . Peritonsillar abscess     Past Surgical History:  Procedure Laterality Date  . INCISION AND DRAINAGE OF PERITONSILLAR ABCESS    . RIGHT/LEFT HEART CATH AND CORONARY ANGIOGRAPHY N/A 02/24/2019   Procedure: RIGHT/LEFT HEART CATH AND CORONARY ANGIOGRAPHY;  Surgeon: Laurey Morale, MD;  Location: [facility] INVASIVE CV LAB;  Service: Cardiovascular;  Laterality: N/A;    Family History  Problem Relation Age of Onset  . Hyperlipidemia Mother   . Diabetes Mother   . Stroke Mother   . Hypertension Mother   . Hypertension Father     Social History   Tobacco Use  . Smoking status: Former Games developer  . Smokeless tobacco: Never Used  Vaping Use  . Vaping Use: Never used  Substance Use Topics  . Alcohol use: No  . Drug use: No    Prior to Admission medications   Medication Sig Start Date End Date Taking? Authorizing Provider  carvedilol (COREG) 12.5 MG tablet Take 1 tablet (12.5 mg total) by mouth 2 (two) times daily with a meal. 06/04/20   Storm Frisk, MD  isosorbide-hydrALAZINE (BIDIL) 20-37.5 MG tablet Take 2 tablets by  mouth 3 (three) times daily. 06/04/20   Storm Frisk, MD  pantoprazole (PROTONIX) 40 MG tablet Take 1 tablet (40 mg total) by mouth daily. 06/04/20 07/04/20  Storm Frisk, MD  potassium chloride SA (KLOR-CON) 20 MEQ tablet Take 1 tablet (20 mEq total) by mouth 2 (two) times daily. 06/04/20   Storm Frisk, MD  sacubitril-valsartan (ENTRESTO) 97-103 MG Take 1 tablet by mouth 2 (two) times daily. 06/04/20   Storm Frisk, MD  spironolactone (ALDACTONE) 25 MG tablet Take 1 tablet (25 mg total) by mouth daily. 06/04/20   Storm Frisk, MD    Allergies Patient has no known allergies.   REVIEW OF SYSTEMS  Negative except as noted here or in the History of Present Illness.   PHYSICAL EXAMINATION  Initial Vital Signs Blood pressure (!) 139/92, pulse 63, temperature 98.3 F (36.8 C), temperature source Oral, resp. rate 20, height 5\' 6"  (1.676 m), weight 114.3 kg, SpO2 100 %.  Examination General: Well-developed, well-nourished male in no acute distress; appearance consistent with age of record HENT: normocephalic; atraumatic Eyes: pupils equal, round and reactive to light; extraocular muscles intact Neck: supple Heart: regular rate and rhythm; no murmur Lungs: clear to auscultation bilaterally Chest: Nontender Abdomen: soft; nondistended; nontender; bowel sounds present Extremities: No deformity; full range of motion; pulses normal  Neurologic: Awake, alert and oriented; motor function intact in all extremities and symmetric; no facial droop Skin: Warm and dry Psychiatric: Normal mood and affect   RESULTS  Summary of this visit's results, reviewed and interpreted by myself:   EKG Interpretation  Date/Time:  Tuesday July 23 2020 21:24:07 EDT Ventricular Rate:  65 PR Interval:  192 QRS Duration: 84 QT Interval:  386 QTC Calculation: 401 R Axis:   -37 Text Interpretation: Normal sinus rhythm Left axis deviation Minimal voltage criteria for LVH, may be normal variant  ( R in aVL ) Abnormal ECG Confirmed by Virgina Norfolk [phone]) on 07/23/2020 9:30:26 PM      Laboratory Studies: Results for orders placed or performed during the hospital encounter of 07/23/20 (from the past 24 hour(s))  Basic metabolic panel     Status: Abnormal   Collection Time: 07/23/20  9:50 PM  Result Value Ref Range   Sodium 138 135 - 145 mmol/L   Potassium 3.8 3.5 - 5.1 mmol/L   Chloride 108 98 - 111 mmol/L   CO2 22 22 - 32 mmol/L   Glucose, Bld 100 (H) 70 - 99 mg/dL   BUN 15 6 - 20 mg/dL   Creatinine, Ser 3.97 (H) 0.61 - 1.24 mg/dL   Calcium 8.5 (L) 8.9 - 10.3 mg/dL   GFR, Estimated 54 (L) >60 mL/min   Anion gap 8 5 - 15  CBC     Status: Abnormal   Collection Time: 07/23/20  9:50 PM  Result Value Ref Range   WBC 6.9 4.0 - 10.5 K/uL   RBC 5.01 4.22 - 5.81 MIL/uL   Hemoglobin 11.7 (L) 13.0 - 17.0 g/dL   HCT 67.3 (L) 41.9 - 37.9 %   MCV 73.5 (L) 80.0 - 100.0 fL   MCH 23.4 (L) 26.0 - 34.0 pg   MCHC 31.8 30.0 - 36.0 g/dL   RDW 02.4 (H) 09.7 - 35.3 %   Platelets 275 150 - 400 K/uL   nRBC 0.0 0.0 - 0.2 %  Troponin I (High Sensitivity)     Status: None   Collection Time: 07/23/20  9:50 PM  Result Value Ref Range   Troponin I (High Sensitivity) 8 <18 ng/L  Troponin I (High Sensitivity)     Status: None   Collection Time: 07/23/20 11:40 PM  Result Value Ref Range   Troponin I (High Sensitivity) 9 <18 ng/L   Imaging Studies: DG Chest 2 View  Result Date: 07/23/2020 CLINICAL DATA:  Chest pain EXAM: CHEST - 2 VIEW COMPARISON:  04/12/2020 FINDINGS: The heart size and mediastinal contours are within normal limits. Both lungs are clear. The visualized skeletal structures are unremarkable. IMPRESSION: No active cardiopulmonary disease. Electronically Signed   By: Jasmine Pang M.D.   On: 07/23/2020 22:00    ED COURSE and MDM  Nursing notes, initial and subsequent vitals signs, including pulse oximetry, reviewed and interpreted by myself.  Vitals:   07/23/20 2125 07/23/20  2126 07/23/20 2145 07/23/20 2336  BP:  (!) 139/92  132/89  Pulse:  63  70  Resp:  20  16  Temp:   98.3 F (36.8 C)   TempSrc:   Oral   SpO2:  100%  99%  Weight: 114.3 kg     Height: 5\' 6"  (1.676 m)      Medications - No data to display  Patient has 2 normal troponins, and unchanged EKG and a story that sounds low risk for cardiac etiology.  Believe it is  safe to discharge him home for outpatient follow-up.   PROCEDURES  Procedures   ED DIAGNOSES     ICD-10-CM   1. Atypical chest pain  R07.89        Paula Libra, MD 07/24/20 [phone]

## 2020-07-23 NOTE — ED Triage Notes (Signed)
Pt c/o CP x 2 days-denies fever/flu sx-NAD-steady gait 

## 2020-07-24 LAB — TROPONIN I (HIGH SENSITIVITY): Troponin I (High Sensitivity): 9 ng/L (ref <18)

## 2020-07-29 MED FILL — CARVEDILOL 12.5 MG TABLET: 12.5 | 30 days supply | Qty: 60 | Fill #0

## 2020-08-09 ENCOUNTER — Encounter (HOSPITAL_COMMUNITY): Payer: Self-pay

## 2020-08-09 ENCOUNTER — Emergency Department (HOSPITAL_COMMUNITY)
Admission: EM | Admit: 2020-08-09 | Discharge: 2020-08-09 | Disposition: A | Discharge status: Home / Self Care | Payer: Self-pay | Attending: Emergency Medicine | Admitting: Emergency Medicine

## 2020-08-09 ENCOUNTER — Other Ambulatory Visit: Payer: Self-pay

## 2020-08-09 ENCOUNTER — Emergency Department (HOSPITAL_COMMUNITY): Payer: Self-pay

## 2020-08-09 DIAGNOSIS — R0789 Other chest pain: Secondary | ICD-10-CM | POA: Insufficient documentation

## 2020-08-09 DIAGNOSIS — Z79899 Other long term (current) drug therapy: Secondary | ICD-10-CM | POA: Insufficient documentation

## 2020-08-09 DIAGNOSIS — I110 Hypertensive heart disease with heart failure: Secondary | ICD-10-CM | POA: Insufficient documentation

## 2020-08-09 DIAGNOSIS — I5022 Chronic systolic (congestive) heart failure: Secondary | ICD-10-CM | POA: Insufficient documentation

## 2020-08-09 DIAGNOSIS — Z87891 Personal history of nicotine dependence: Secondary | ICD-10-CM | POA: Insufficient documentation

## 2020-08-09 DIAGNOSIS — I130 Hypertensive heart and chronic kidney disease with heart failure and stage 1 through stage 4 chronic kidney disease, or unspecified chronic kidney disease: Secondary | ICD-10-CM | POA: Insufficient documentation

## 2020-08-09 DIAGNOSIS — N182 Chronic kidney disease, stage 2 (mild): Secondary | ICD-10-CM | POA: Insufficient documentation

## 2020-08-09 DIAGNOSIS — R0602 Shortness of breath: Secondary | ICD-10-CM | POA: Insufficient documentation

## 2020-08-09 LAB — CBC WITH DIFFERENTIAL/PLATELET
Abs Immature Granulocytes: 0.04 10*3/uL (ref 0.00–0.07)
Basophils Absolute: 0 10*3/uL (ref 0.0–0.1)
Basophils Relative: 0 %
Eosinophils Absolute: 0.3 10*3/uL (ref 0.0–0.5)
Eosinophils Relative: 3 %
HCT: 35.9 % — ABNORMAL LOW (ref 39.0–52.0)
Hemoglobin: 11.4 g/dL — ABNORMAL LOW (ref 13.0–17.0)
Immature Granulocytes: 1 %
Lymphocytes Relative: 32 %
Lymphs Abs: 2.8 10*3/uL (ref 0.7–4.0)
MCH: 23.6 pg — ABNORMAL LOW (ref 26.0–34.0)
MCHC: 31.8 g/dL (ref 30.0–36.0)
MCV: 74.3 fL — ABNORMAL LOW (ref 80.0–100.0)
Monocytes Absolute: 1 10*3/uL (ref 0.1–1.0)
Monocytes Relative: 11 %
Neutro Abs: 4.6 10*3/uL (ref 1.7–7.7)
Neutrophils Relative %: 53 %
Platelets: 246 10*3/uL (ref 150–400)
RBC: 4.83 MIL/uL (ref 4.22–5.81)
RDW: 15.6 % — ABNORMAL HIGH (ref 11.5–15.5)
WBC: 8.7 10*3/uL (ref 4.0–10.5)
nRBC: 0 % (ref 0.0–0.2)

## 2020-08-09 LAB — BASIC METABOLIC PANEL
Anion gap: 7 (ref 5–15)
BUN: 19 mg/dL (ref 6–20)
CO2: 24 mmol/L (ref 22–32)
Calcium: 8.6 mg/dL — ABNORMAL LOW (ref 8.9–10.3)
Chloride: 107 mmol/L (ref 98–111)
Creatinine, Ser: 1.62 mg/dL — ABNORMAL HIGH (ref 0.61–1.24)
GFR, Estimated: 56 mL/min — ABNORMAL LOW (ref >60)
Glucose, Bld: 133 mg/dL — ABNORMAL HIGH (ref 70–99)
Potassium: 3.7 mmol/L (ref 3.5–5.1)
Sodium: 138 mmol/L (ref 135–145)

## 2020-08-09 LAB — TROPONIN I (HIGH SENSITIVITY)
Troponin I (High Sensitivity): 10 ng/L (ref <18)
Troponin I (High Sensitivity): 9 ng/L (ref <18)

## 2020-08-09 NOTE — ED Provider Notes (Signed)
[facility]-EMERGENCY DEPT Provider Note   CSN: [id number] Arrival date & time: 08/09/20  5361     History Chief Complaint  Patient presents with  . Chest Pain    Derek Reed is a 35 y.o. male.  HPI     This a 35 year old male with a history of nonischemic cardiomyopathy, hypertension, obesity who presents with chest pain.  Patient reports he woke up from sleep this morning with some pressure-like anterior chest pain.  He describes it as tightness.  Chest pain self resolved prior to arrival.  He did have some associated shortness of breath.  It is somewhat similar to prior episodes of chest pain.  He has had multiple evaluations recently for the same.  No known history of sleep apnea but does state that he snores at night.  He is unsure whether his pain is worse at night as he states it happens randomly during the day.  It is not exertional in nature.  Denies recent fever, cough, infectious symptoms.  Chart reviewed.  He is treated for nonischemic cardiomyopathy.  Cardiac catheterization in 2020 with no evidence of coronary artery disease.  Past Medical History:  Diagnosis Date  . Acute CHF (congestive heart failure) (HCC) 02/24/2019  . Anxiety   . Hypertension   . Obesity   . Peritonsillar abscess     Patient Active Problem List   Diagnosis Date Noted  . HTN (hypertension) 02/24/2019  . 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 GAD (generalized anxiety disorder) 03/26/2014  . BMI 36.0-36.9,adult 03/26/2014    Past Surgical History:  Procedure Laterality Date  . INCISION AND DRAINAGE OF PERITONSILLAR ABCESS    . RIGHT/LEFT HEART CATH AND CORONARY ANGIOGRAPHY N/A 02/24/2019   Procedure: RIGHT/LEFT HEART CATH AND CORONARY ANGIOGRAPHY;  Surgeon: Laurey Morale, MD;  Location: [facility] INVASIVE CV LAB;  Service: Cardiovascular;  Laterality: N/A;       Family History  Problem  Relation Age of Onset  . Hyperlipidemia Mother   . Diabetes Mother   . Stroke Mother   . Hypertension Mother   . Hypertension Father     Social History   Tobacco Use  . Smoking status: Former Games developer  . Smokeless tobacco: Never Used  Vaping Use  . Vaping Use: Never used  Substance Use Topics  . Alcohol use: No  . Drug use: No    Home Medications Prior to Admission medications   Medication Sig Start Date End Date Taking? Authorizing Provider  carvedilol (COREG) 12.5 MG tablet Take 1 tablet (12.5 mg total) by mouth 2 (two) times daily with a meal. 06/04/20  Yes Storm Frisk, MD  isosorbide-hydrALAZINE (BIDIL) 20-37.5 MG tablet Take 2 tablets by mouth 3 (three) times daily. 06/04/20  Yes Storm Frisk, MD  pantoprazole (PROTONIX) 40 MG tablet Take 1 tablet (40 mg total) by mouth daily. 06/04/20 07/04/20 Yes Storm Frisk, MD  potassium chloride SA (KLOR-CON) 20 MEQ tablet Take 1 tablet (20 mEq total) by mouth 2 (two) times daily. 06/04/20  Yes Storm Frisk, MD  sacubitril-valsartan (ENTRESTO) 97-103 MG Take 1 tablet by mouth 2 (two) times daily. 06/04/20  Yes Storm Frisk, MD  spironolactone (ALDACTONE) 25 MG tablet Take 1 tablet (25 mg total) by mouth daily. 06/04/20  Yes Storm Frisk, MD    Allergies    Patient has no known allergies.  Review of Systems   Review  of Systems  Constitutional: Negative for fever.  Respiratory: Positive for shortness of breath. Negative for cough.   Cardiovascular: Positive for chest pain.  Gastrointestinal: Negative for abdominal pain, nausea and vomiting.  All other systems reviewed and are negative.   Physical Exam Updated Vital Signs BP (!) 141/96 (BP Location: Left Arm)   Pulse 69   Temp 98.6 F (37 C) (Oral)   Resp 18   Ht 1.676 m (5\' 6" )   Wt 111.1 kg   SpO2 100%   BMI 39.54 kg/m   Physical Exam Vitals and nursing note reviewed.  Constitutional:      Appearance: He is well-developed. He is obese. He is  not ill-appearing.  HENT:     Head: Normocephalic and atraumatic.  Eyes:     Pupils: Pupils are equal, round, and reactive to light.  Cardiovascular:     Rate and Rhythm: Normal rate and regular rhythm.     Heart sounds: Normal heart sounds. No murmur heard.   Pulmonary:     Effort: Pulmonary effort is normal. No respiratory distress.     Breath sounds: Normal breath sounds. No wheezing.  Chest:     Chest wall: Tenderness present.  Abdominal:     General: Bowel sounds are normal.     Palpations: Abdomen is soft.     Tenderness: There is no abdominal tenderness. There is no rebound.  Musculoskeletal:     Cervical back: Neck supple.     Right lower leg: No edema.     Left lower leg: No edema.  Lymphadenopathy:     Cervical: No cervical adenopathy.  Skin:    General: Skin is warm and dry.  Neurological:     Mental Status: He is alert and oriented to person, place, and time.  Psychiatric:        Mood and Affect: Mood normal.     ED Results / Procedures / Treatments   Labs (all labs ordered are listed, but only abnormal results are displayed) Labs Reviewed  CBC WITH DIFFERENTIAL/PLATELET - Abnormal; Notable for the following components:      Result Value   Hemoglobin 11.4 (*)    HCT 35.9 (*)    MCV 74.3 (*)    MCH 23.6 (*)    RDW 15.6 (*)    All other components within normal limits  BASIC METABOLIC PANEL - Abnormal; Notable for the following components:   Glucose, Bld 133 (*)    Creatinine, Ser 1.62 (*)    Calcium 8.6 (*)    GFR, Estimated 56 (*)    All other components within normal limits  TROPONIN I (HIGH SENSITIVITY)  TROPONIN I (HIGH SENSITIVITY)    EKG EKG Interpretation  Date/Time:  Friday August 09 2020 03:46:25 EDT Ventricular Rate:  73 PR Interval:  199 QRS Duration: 102 QT Interval:  388 QTC Calculation: 428 R Axis:   -7 Text Interpretation: Sinus rhythm LVH with secondary repolarization abnormality No significant change since last tracing  Confirmed by [id number] (Ross Marcus) on 08/09/2020 4:21:57 AM   Radiology DG Chest 2 View  Result Date: 08/09/2020 CLINICAL DATA:  Chest pain EXAM: CHEST - 2 VIEW COMPARISON:  None. FINDINGS: The heart size and mediastinal contours are within normal limits. Both lungs are clear. The visualized skeletal structures are unremarkable. IMPRESSION: No active cardiopulmonary disease. Electronically Signed   By: 10/09/2020 MD   On: 08/09/2020 04:16    Procedures Procedures   Medications Ordered in ED Medications - No  data to display  ED Course  I have reviewed the triage vital signs and the nursing notes.  Pertinent labs & imaging results that were available during my care of the patient were reviewed by me and considered in my medical decision making (see chart for details).    MDM Rules/Calculators/A&P                          Patient presents with chest pain.  He has been seen and evaluated multiple times recently for the same.  He is nontoxic-appearing.  Slightly elevated blood pressure 141/96.  O2 sats 100%.  He is PE RC negative.  He has had a history of nonischemic cardiomyopathy with a reassuring cardiac catheterization within the last 2 years.  EKG shows no evidence of acute ischemia or arrhythmia.  Troponin x2 -.  Question whether he may have some sleep apnea contributing to his symptoms.  Recommend close follow-up with his primary physician.  After history, exam, and medical workup I feel the patient has been appropriately medically screened and is safe for discharge home. Pertinent diagnoses were discussed with the patient. Patient was given return precautions.  Final Clinical Impression(s) / ED Diagnoses Final diagnoses:  Atypical chest pain    Rx / DC Orders ED Discharge Orders    None       Shon Baton, MD 08/09/20 [phone]

## 2020-08-09 NOTE — Discharge Instructions (Addendum)
You are seen today for chest pain.  Your work-up is reassuring.  Follow-up closely with your primary physician. °

## 2020-08-09 NOTE — ED Triage Notes (Signed)
Pt reports sternal chest pain/ pressure that radiates to the left side. Pain has been on going for several weeks.

## 2020-08-10 ENCOUNTER — Other Ambulatory Visit: Payer: Self-pay

## 2020-08-15 ENCOUNTER — Other Ambulatory Visit: Payer: Self-pay

## 2020-08-15 MED FILL — Spironolactone Tab 25 MG: ORAL | 30 days supply | Qty: 30 | Fill #0 | Status: AC

## 2020-08-15 MED FILL — Pantoprazole Sodium EC Tab 40 MG (Base Equiv): ORAL | 30 days supply | Qty: 30 | Fill #0 | Status: AC

## 2020-08-15 MED FILL — Potassium Chloride Microencapsulated Crys ER Tab 20 mEq: ORAL | 30 days supply | Qty: 60 | Fill #0 | Status: AC

## 2020-08-16 ENCOUNTER — Other Ambulatory Visit: Payer: Self-pay

## 2020-08-24 ENCOUNTER — Emergency Department (HOSPITAL_COMMUNITY)
Admission: EM | Admit: 2020-08-24 | Discharge: 2020-08-24 | Disposition: A | Discharge status: Home / Self Care | Payer: Self-pay | Attending: Emergency Medicine | Admitting: Emergency Medicine

## 2020-08-24 ENCOUNTER — Emergency Department (HOSPITAL_COMMUNITY): Payer: Self-pay

## 2020-08-24 ENCOUNTER — Other Ambulatory Visit: Payer: Self-pay

## 2020-08-24 ENCOUNTER — Encounter (HOSPITAL_COMMUNITY): Payer: Self-pay

## 2020-08-24 DIAGNOSIS — R6889 Other general symptoms and signs: Secondary | ICD-10-CM | POA: Diagnosis not present

## 2020-08-24 DIAGNOSIS — I130 Hypertensive heart and chronic kidney disease with heart failure and stage 1 through stage 4 chronic kidney disease, or unspecified chronic kidney disease: Secondary | ICD-10-CM | POA: Insufficient documentation

## 2020-08-24 DIAGNOSIS — R072 Precordial pain: Secondary | ICD-10-CM

## 2020-08-24 DIAGNOSIS — R079 Chest pain, unspecified: Secondary | ICD-10-CM | POA: Diagnosis not present

## 2020-08-24 DIAGNOSIS — Z743 Need for continuous supervision: Secondary | ICD-10-CM | POA: Diagnosis not present

## 2020-08-24 DIAGNOSIS — F419 Anxiety disorder, unspecified: Secondary | ICD-10-CM | POA: Insufficient documentation

## 2020-08-24 DIAGNOSIS — Z87891 Personal history of nicotine dependence: Secondary | ICD-10-CM | POA: Insufficient documentation

## 2020-08-24 DIAGNOSIS — I499 Cardiac arrhythmia, unspecified: Secondary | ICD-10-CM | POA: Diagnosis not present

## 2020-08-24 DIAGNOSIS — I5023 Acute on chronic systolic (congestive) heart failure: Secondary | ICD-10-CM | POA: Insufficient documentation

## 2020-08-24 DIAGNOSIS — N182 Chronic kidney disease, stage 2 (mild): Secondary | ICD-10-CM | POA: Insufficient documentation

## 2020-08-24 LAB — BASIC METABOLIC PANEL
Anion gap: 9 (ref 5–15)
BUN: 12 mg/dL (ref 6–20)
CO2: 23 mmol/L (ref 22–32)
Calcium: 9 mg/dL (ref 8.9–10.3)
Chloride: 108 mmol/L (ref 98–111)
Creatinine, Ser: 1.71 mg/dL — ABNORMAL HIGH (ref 0.61–1.24)
GFR, Estimated: 52 mL/min — ABNORMAL LOW (ref >60)
Glucose, Bld: 120 mg/dL — ABNORMAL HIGH (ref 70–99)
Potassium: 4 mmol/L (ref 3.5–5.1)
Sodium: 140 mmol/L (ref 135–145)

## 2020-08-24 LAB — CBC
HCT: 37.9 % — ABNORMAL LOW (ref 39.0–52.0)
Hemoglobin: 11.9 g/dL — ABNORMAL LOW (ref 13.0–17.0)
MCH: 23.1 pg — ABNORMAL LOW (ref 26.0–34.0)
MCHC: 31.4 g/dL (ref 30.0–36.0)
MCV: 73.6 fL — ABNORMAL LOW (ref 80.0–100.0)
Platelets: 257 10*3/uL (ref 150–400)
RBC: 5.15 MIL/uL (ref 4.22–5.81)
RDW: 15.4 % (ref 11.5–15.5)
WBC: 7.3 10*3/uL (ref 4.0–10.5)
nRBC: 0 % (ref 0.0–0.2)

## 2020-08-24 LAB — TROPONIN I (HIGH SENSITIVITY): Troponin I (High Sensitivity): 12 ng/L (ref <18)

## 2020-08-24 MED ORDER — LORAZEPAM 1 MG PO TABS
1.0000 mg | ORAL_TABLET | Freq: Once | ORAL | Status: AC
  Administered 2020-08-24: 1 mg via ORAL
  Filled 2020-08-24: qty 1

## 2020-08-24 MED ORDER — LORAZEPAM 1 MG PO TABS: 1.0000 mg | ORAL_TABLET | Freq: Three times a day (TID) | ORAL | 0 refills | Status: DC | PRN

## 2020-08-24 NOTE — ED Provider Notes (Signed)
[facility] EMERGENCY DEPARTMENT Provider Note   CSN: [id number] Arrival date & time: 08/24/20  0251     History Chief Complaint  Patient presents with  . Chest Pain    Derek Reed is a 35 y.o. male.  The history is provided by the patient.  Chest Pain Pain location:  L chest Pain quality comment:  Squeezing Pain radiates to:  Does not radiate Pain severity:  Moderate Onset quality:  Sudden Timing:  Constant Progression:  Unchanged Chronicity:  Recurrent Relieved by:  Nothing Worsened by:  Nothing Associated symptoms: no fever, no shortness of breath and no vomiting   Patient with history of nonischemic cardiomyopathy, hypertension presents with chest pain.  Patient reports this occurred several hours ago prior to arrival.  It occurred while he was sleeping.  He reports he woke up with left-sided chest pain and squeezing.  No shortness of breath.  No cough or fever.  No vomiting.  He has had similar episodes in the past.  Patient also reports significant anxiety.    Patient reports he is med compliant except for his BiDil dosing he takes twice daily Denies any drug or alcohol abuse.  He is a non-smoker Past Medical History:  Diagnosis Date  . Acute CHF (congestive heart failure) (HCC) 02/24/2019  . Anxiety   . Hypertension   . Obesity   . Peritonsillar abscess     Patient Active Problem List   Diagnosis Date Noted  . HTN (hypertension) 02/24/2019  . 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 GAD (generalized anxiety disorder) 03/26/2014  . BMI 36.0-36.9,adult 03/26/2014    Past Surgical History:  Procedure Laterality Date  . INCISION AND DRAINAGE OF PERITONSILLAR ABCESS    . RIGHT/LEFT HEART CATH AND CORONARY ANGIOGRAPHY N/A 02/24/2019   Procedure: RIGHT/LEFT HEART CATH AND CORONARY ANGIOGRAPHY;  Surgeon: Laurey Morale, MD;  Location: [facility] INVASIVE CV LAB;  Service:  Cardiovascular;  Laterality: N/A;       Family History  Problem Relation Age of Onset  . Hyperlipidemia Mother   . Diabetes Mother   . Stroke Mother   . Hypertension Mother   . Hypertension Father     Social History   Tobacco Use  . Smoking status: Former Games developer  . Smokeless tobacco: Never Used  Vaping Use  . Vaping Use: Never used  Substance Use Topics  . Alcohol use: No  . Drug use: No    Home Medications Prior to Admission medications   Medication Sig Start Date End Date Taking? Authorizing Provider  carvedilol (COREG) 12.5 MG tablet TAKE 1 TABLET (12.5 MG TOTAL) BY MOUTH 2 (TWO) TIMES DAILY WITH A MEAL. Patient taking differently: Take 12.5 mg by mouth 2 (two) times daily with a meal. 06/04/20 06/04/21 Yes Storm Frisk, MD  isosorbide-hydrALAZINE (BIDIL) 20-37.5 MG tablet TAKE 2 TABLETS BY MOUTH 3 (THREE) TIMES DAILY. Patient taking differently: Take 2 tablets by mouth in the morning and at bedtime. 06/04/20 06/04/21 Yes Storm Frisk, MD  pantoprazole (PROTONIX) 40 MG tablet TAKE 1 TABLET (40 MG TOTAL) BY MOUTH DAILY. Patient taking differently: Take 40 mg by mouth daily. 06/04/20 06/04/21 Yes Storm Frisk, MD  potassium chloride SA (KLOR-CON) 20 MEQ tablet TAKE 1 TABLET (20 MEQ TOTAL) BY MOUTH 2 (TWO) TIMES DAILY. Patient taking differently: Take 20 mEq by mouth 2 (two) times daily. 06/04/20 06/04/21 Yes Storm Frisk, MD  sacubitril-valsartan (  ENTRESTO) 97-103 MG TAKE 1 TABLET BY MOUTH 2 (TWO) TIMES DAILY. 06/04/20 06/04/21 Yes Storm Frisk, MD  spironolactone (ALDACTONE) 25 MG tablet TAKE 1 TABLET (25 MG TOTAL) BY MOUTH DAILY. Patient taking differently: Take 25 mg by mouth daily. 06/04/20 06/04/21 Yes Storm Frisk, MD    Allergies    Patient has no known allergies.  Review of Systems   Review of Systems  Constitutional: Negative for fever.  Respiratory: Negative for shortness of breath.   Cardiovascular: Positive for chest pain. Negative for  leg swelling.  Gastrointestinal: Negative for vomiting.  Psychiatric/Behavioral: The patient is nervous/anxious.   All other systems reviewed and are negative.   Physical Exam Updated Vital Signs BP 140/85 (BP Location: Right Arm)   Pulse 66   Temp 98.8 F (37.1 C) (Oral)   Resp (!) 9   Ht 1.676 m (5\' 6" )   Wt 111.1 kg   SpO2 98%   BMI 39.54 kg/m   Physical Exam CONSTITUTIONAL: Well developed/well nourished, patient appears anxious, tapping his right foot HEAD: Normocephalic/atraumatic EYES: EOMI/PERRL ENMT: Mucous membranes moist NECK: supple no meningeal signs SPINE/BACK:entire spine nontender CV: S1/S2 noted, no murmurs/rubs/gallops noted LUNGS: Lungs are clear to auscultation bilaterally, no apparent distress Chest-no chest wall tenderness ABDOMEN: soft, nontender, no rebound or guarding, bowel sounds noted throughout abdomen GU:no cva tenderness NEURO: Pt is awake/alert/appropriate, moves all extremitiesx4.  No facial droop.   EXTREMITIES: pulses normal/equal, full ROM SKIN: warm, color normal PSYCH: Anxious  ED Results / Procedures / Treatments   Labs (all labs ordered are listed, but only abnormal results are displayed) Labs Reviewed  BASIC METABOLIC PANEL - Abnormal; Notable for the following components:      Result Value   Glucose, Bld 120 (*)    Creatinine, Ser 1.71 (*)    GFR, Estimated 52 (*)    All other components within normal limits  CBC - Abnormal; Notable for the following components:   Hemoglobin 11.9 (*)    HCT 37.9 (*)    MCV 73.6 (*)    MCH 23.1 (*)    All other components within normal limits  TROPONIN I (HIGH SENSITIVITY)    EKG EKG Interpretation  Date/Time:  Saturday August 24 2020 03:22:20 EDT Ventricular Rate:  67 PR Interval:  197 QRS Duration: 95 QT Interval:  395 QTC Calculation: 417 R Axis:   0 Text Interpretation: Sinus rhythm ST elev, probable normal early repol pattern No significant change since last tracing Confirmed  by 12-24-1990 (Zadie Rhine) on 08/24/2020 3:30:09 AM   Radiology DG Chest 2 View  Result Date: 08/24/2020 CLINICAL DATA:  Chest pain EXAM: CHEST - 2 VIEW COMPARISON:  08/09/2020 radiograph, 04/22/2020 CT FINDINGS: No consolidation, features of edema, pneumothorax, or effusion. Pulmonary vascularity is normally distributed. The cardiomediastinal contours are unremarkable. No acute osseous or soft tissue abnormality. IMPRESSION: No acute cardiopulmonary abnormality. Electronically Signed   By: 04/24/2020 M.D.   On: 08/24/2020 03:15    Procedures Procedures   Medications Ordered in ED Medications  LORazepam (ATIVAN) tablet 1 mg (1 mg Oral Given 08/24/20 0359)    ED Course  I have reviewed the triage vital signs and the nursing notes.  Pertinent labs & imaging results that were available during my care of the patient were reviewed by me and considered in my medical decision making (see chart for details).    MDM Rules/Calculators/A&P  Patient with history of nonischemic cardiomyopathy.  Patient had cardiac cath in 2020 that was negative for obstructive disease. Patient had a CT coronary in 2021 that was also negative Patient is frustrated that he keeps having chest pain, but he also thinks that there is a component of anxiety Patient does appear very anxious.  He is on no medications at this time for anxiety. At this time I feel there is very low likelihood for ACS/PE/dissection.  Essentially he is med compliant but still having significant hypertension which may be causing some of his chest pain 5:38 AM Patient resting comfortably and feels improved. He is in no acute distress at this time. He will be discharged home with close PCP follow-up as he may need further alterations in his blood pressure medications.  Short course of Ativan provided, but I advised patient this is not a long-term solution and may need other medications to manage his Final Clinical  Impression(s) / ED Diagnoses Final diagnoses:  Precordial pain  Anxiety    Rx / DC Orders ED Discharge Orders         Ordered    LORazepam (ATIVAN) 1 MG tablet  Every 8 hours PRN        08/24/20 0536           Zadie Rhine, MD 08/24/20 [phone]

## 2020-08-24 NOTE — ED Triage Notes (Signed)
Pt comes via [facility] for CP that started about an hour ago while sleeping. PTA received 324 ASA and 2 nitro with some relief of pain.

## 2020-08-26 ENCOUNTER — Telehealth: Payer: Self-pay | Admitting: *Deleted

## 2020-08-26 NOTE — Telephone Encounter (Signed)
Patient verified DOB Patient is aware of PCP wanting to have a visit with him on the [facility] while he covers. Patient requested 4/21 and shared 9am was fine for arrival at the [facility]. Patient was made aware of [facility] being a walkin clinic and a "soft hold" being put in place at that time. Patient was appreciative and plans to report to the [facility] at that time.

## 2020-08-26 NOTE — Telephone Encounter (Signed)
-----   Message from Storm Frisk, MD sent at 08/26/2020  5:56 AM EDT ----- Regarding: FW: patient followup Mallory  we need to move this pt up sooner appt with me .  [facility] appt is ok,  [place]: pls partner with mallory on a time he can come to see me on [facility] , prefer this week or next , post hosp OV, freq ED Juanita Laster visits ----- Message ----- From: Zadie Rhine, MD Sent: 08/24/2020   5:38 AM EDT To: Storm Frisk, MD Subject: patient followup                               Hi Pat, Can you follow-up with this patient in the next 1-2 weeks? He has been having multiple ER visits for chest pain.  Patient appears to be having significant anxiety, and he agrees with this.  But he is also med compliant but still having significant hypertension.  Advised he may need to have medication adjustment.  I gave a short course of Ativan to manage his anxiety  Thanks, Donnie

## 2020-08-29 ENCOUNTER — Other Ambulatory Visit: Payer: Self-pay

## 2020-08-29 ENCOUNTER — Ambulatory Visit: Payer: Self-pay | Admitting: Critical Care Medicine

## 2020-08-29 VITALS — BP 153/69 | HR 86 | Ht 66.0 in | Wt 250.0 lb

## 2020-08-29 DIAGNOSIS — N182 Chronic kidney disease, stage 2 (mild): Secondary | ICD-10-CM

## 2020-08-29 DIAGNOSIS — I5022 Chronic systolic (congestive) heart failure: Secondary | ICD-10-CM

## 2020-08-29 DIAGNOSIS — F411 Generalized anxiety disorder: Secondary | ICD-10-CM

## 2020-08-29 DIAGNOSIS — I10 Essential (primary) hypertension: Secondary | ICD-10-CM

## 2020-08-29 MED ORDER — POTASSIUM CHLORIDE CRYS ER 20 MEQ PO TBCR
20.0000 meq | EXTENDED_RELEASE_TABLET | Freq: Two times a day (BID) | ORAL | 3 refills | Status: DC
  Filled 2020-08-29 – 2020-09-13 (×2): qty 60, 30d supply, fill #0
  Filled 2020-10-15: qty 60, 30d supply, fill #1
  Filled 2020-11-13: qty 60, 30d supply, fill #2
  Filled 2020-12-11: qty 60, 30d supply, fill #3

## 2020-08-29 MED ORDER — NITROGLYCERIN 0.4 MG SL SUBL
0.4000 mg | SUBLINGUAL_TABLET | SUBLINGUAL | 3 refills | Status: DC | PRN
  Filled 2020-08-29: qty 30, 30d supply, fill #0

## 2020-08-29 MED ORDER — SPIRONOLACTONE 25 MG PO TABS
25.0000 mg | ORAL_TABLET | Freq: Every day | ORAL | 1 refills | Status: DC
  Filled 2020-08-29: qty 90, 90d supply, fill #0
  Filled 2020-09-13: qty 30, 30d supply, fill #0
  Filled 2020-10-15: qty 30, 30d supply, fill #1
  Filled 2020-11-13: qty 30, 30d supply, fill #2
  Filled 2020-12-11: qty 30, 30d supply, fill #3

## 2020-08-29 MED ORDER — HYDROXYZINE PAMOATE 25 MG PO CAPS
25.0000 mg | ORAL_CAPSULE | Freq: Three times a day (TID) | ORAL | 1 refills | Status: DC | PRN
  Filled 2020-08-29: qty 60, 20d supply, fill #0

## 2020-08-29 MED ORDER — ISOSORB DINITRATE-HYDRALAZINE 20-37.5 MG PO TABS
ORAL_TABLET | ORAL | 3 refills | Status: DC
  Filled 2020-08-29: qty 180, 30d supply, fill #0

## 2020-08-29 MED ORDER — CARVEDILOL 12.5 MG PO TABS
12.5000 mg | ORAL_TABLET | Freq: Two times a day (BID) | ORAL | 3 refills | Status: DC
  Filled 2020-08-29: qty 60, 30d supply, fill #0
  Filled 2020-09-27: qty 60, 30d supply, fill #1

## 2020-08-29 MED ORDER — PANTOPRAZOLE SODIUM 40 MG PO TBEC
40.0000 mg | DELAYED_RELEASE_TABLET | Freq: Every day | ORAL | 2 refills | Status: DC
  Filled 2020-08-29 – 2020-09-13 (×2): qty 30, 30d supply, fill #0

## 2020-08-29 NOTE — Assessment & Plan Note (Signed)
Hypertension not optimally controlled but note patient skipping BiDil doses  Plan to resume BiDil 3 times daily and continue other medications as prescribed refills given

## 2020-08-29 NOTE — Progress Notes (Signed)
BP elevated at ED visit. Sleep study recommended. Anxiety.

## 2020-08-29 NOTE — Patient Instructions (Signed)
Refills on all medications sent to our pharmacy  Please discontinue the lorazepam and begin hydroxyzine 25 mg 3 times daily as needed for anxiety  Refill on nitroglycerin was given use that as needed for severe chest pain  Our behavioral therapy clinician Ms. Mayers will have a visit with you to discuss your anxiety and coping mechanisms  We are making another appointment at the advanced heart failure clinic with Dr. Jearld Pies  Also an appointment with Dr. Delford Field will be scheduled within 1 month

## 2020-08-29 NOTE — Assessment & Plan Note (Signed)
Renal function stable at this time 

## 2020-08-29 NOTE — Progress Notes (Signed)
Subjective:    Patient ID: Derek Reed, male    DOB: [birth year], 35 y.o.   MRN: [id number]   35 y.o.M with telemedicine phone visit for CHF f/u and to establish for Primary care . This patient was admitted in October for acute pulmonary edema and hypertensive cardiomyopathy discharge summary is as below  Admit date:02/23/2019 Discharge date:02/26/2019  Time spent:30mnutes  Recommendations for Outpatient Follow-up: 1.         [facility], please check be met at follow-up   Discharge Diagnoses: Principal Problem: Acute systolic CHF Hypertensive cardiomyopathy Borderline diabetes Hypokalemia HTN (hypertension) Elevated troponin CKD (chronic kidney disease), stage II Anxiety Acute systolic CHF (congestive heart failure) ([place]   Discharge Condition:Stable  Diet recommendation:Carb modified, heart healthy                                    Filed Weights            02/24/19 0528 02/25/19 0616 02/26/19 0554 Weight:            105.1 kg          108.8 kg          105.1 kg   History of present illness: Derek Hooperis a 35y.o.malewith medical history significant ofhypertension not taking medications, CKD-2, peritonsillar abscess, anxiety, who presented with shortness of breath, for more than 2 weeks. He has dry cough. - COVID-19 test was negative, EKG noted LVH Chest x-ray showed increased cardiac silhouette and interstitial edema   Hospital Course:  Acute CHF/new onset -Hypertensive cardiomyopathy, has LVH on EKG -Clinically improved with diuresis, he was -5 L at discharge -2D echo with EF of 25 to 30%,cardiology consultunderwent left heart cath 10/16which showed minimal nonobstructive disease, consistent with nonischemic cardiomyopathy -Transition to oral Lasix, Entresto, Coreg and Aldactone at discharge -Seen by case management for patient assistance, he was given a mSystems developerand EDelene Loll card -Educated regarding diet and lifestyle modification  Elevated troponin: -Troponin156 -->153. 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  Since discharge the patient has been in the advanced heart failure clinic for follow-up as below OP CV f/u 10/27: 35y/o [name] with PMH of untreated HTN [facility]. He presented with acute dyspnea and was found to be markedly hypertensive and in acute pulmonary edema. He was started on IV diuretics and antihypertensives. 2D echo showed severely reduced LVEF25-30% with mild LV dilation and mild LV hypertrophy, mildly decreased RV systolic function. LHC/RHC showed normal filling pressures and cardiac output after diuresis, no coronary disease on angiography. Noh/odrugs/ETOH/smoking. Mother with "heart disease" but he is not clear on the diagnosis.   Primary etiology for his HF was felt to be poorly controlled HTN. After he was diuresed back to euvolemic state, he was transitioned to PO lasix.  Also treated w/ Entresto, Spironolactone and Coreg. Discharge weight was 231 lb.   He presents to clinic today for post hospital f/u. Doing well. Denies exertional dyspnea. No weight gain or LEE. Compliant w/ meds. Tolerating well. No side effects. Has modified diet. Trying to eat healthier and reading food labels.  Trying to keep Na intake under 1500 mg/day. Office weight 233 lb today. BP 122/80.  1. Systolic Heart Failure: Recent Echo 10/20 w/ reduced LVEF 25-30% with mild LV dilation and mild LV hypertrophy, mildly decreased RV systolic function.  Cause of cardiomyopathy may be long-standing uncontrolled HTN. LHC/RHC showed normal filling pressures and cardiac output after diuresis, no coronary disease on angiography. No drugs/ETOH/smoking. Mother with "heart disease" but he is not clear on the diagnosis. -NYHA Class II, euvolemic on exam. Wt stable compared to recent hospital d/c wt -BP improved. 122/80 today.  -Check BMP today -ContinueEntresto to49-51bid - Reduce Lasix to 20 mg daily - Continue spironolactone25 mg daily.  - ContinueCoreg 3.125 mg bid.  -Add Bidil 1 tablet tid - we discussed importance of daily wts and low sodium diet  - f/u w/ PharmD in 3 weeks for further med titration. -will ultimately need repeat echo 3 months after med optimization  2.HQI:ONGEXBM improved. 122/80 today. -HF meds per above.   At the last cardiology visit the patient was to continue Entresto twice daily current dose but to reduce Lasix to 20 mg daily and begin BiDil 1 tablet 3 times daily along with continuing Coreg and spironolactone.  The patient states he did not obtain the BiDil as he cannot afford it at his private pharmacy as he is self-pay status.  The patient states his breathing is better he is having no cough or chest pain he has no edema in the lower extremities.  He is taking all his medications as prescribed.  He had questions on what to do with the potassium dose as he is about out.  Note his potassium level is 4.6 at the last cardiology visit and he is on Aldactone.  04/06/19 Since the last visit which was a telephone visit this is a face-to-face visit.  The patient is improved with less shortness of breath no edema no chest pain.  He has been maintaining his medications  appropriately.  Note his blood pressure today was 120/80 on arrival.  He has increased his furosemide to 40 mg daily There is no other change in his medication at this time except he has been started on the BiDil  06/06/2019 Patient is seen in follow-up for chronic systolic heart failure due to hypertension.  He recently saw cardiology and had his Coreg dose increased to 6.25 mg twice daily he is also on a higher dose of Entresto maintains Aldactone potassium supplement and BiDil he is only taking furosemide as needed at this time. Note he went to the emergency room on 19 January for an abscess behind the left ear and had it incised and drained and is finishing a course of doxycycline  The patient does relate today that he has significant amounts of stress and anxiety at this time  The patient's not drinking alcohol any longer and does not use tobacco products  Recent echocardiogram was repeated and showed ejection fraction up to 55 to 60% which is markedly improved from prior values  Blood pressure today is at 145/85 and is improved control   09/27/2019 The patient was seen in return follow-up for chronic systolic heart failure and hypertension.  The patient's  been compliant with all of his medications.  He denies chest pain or shortness of breath.  He is now off diuretics.  He is following up with the heart failure clinic and maintaining all medications.  Currently the patient maintains Coreg 6.25 mg twice daily Entresto 97/1031 twice daily Aldactone 25 mg daily and BiDil at 20/37 2 tablets 3 times daily he also continues with potassium supplementation  The patient started a job working at [employer] to [employer] with this he continue with patient assistance with Delene Loll  06/04/2020 This patient is seen today for primary care follow-up last seen in May 2021.  Patient's last heart failure visit in clinic was November 2021 with Dr. Algernon Huxley.  At that visit documentation is as  noted below  Last seen CHF clinic 03/2020: 1. HTN 2. Chronic systolic CHF: Nonischemic cardiomyopathy.  - Echo (10/20): EF 25-30%, mildly decreased RV systolic function.  - LHC/RHC (10/20): No significant CAD.  Mean RA 3, PA 32/10, mean PCWP 12, CI 2.69.  - Echo (1/21): EF 55-60%, mild LVH, normal RV.   Chest pain: Patient had cath in 2020 without significant CAD.  He has had several episodes of substernal chest tightness, sometimes with exertion and sometimes at rest.  ECG shows LVH with likely repolarization abnormality.   - Given ongoing symptoms, will arrange for coronary CT angiogram to rule out obstructive CAD.  2. Chronic systolic CHF: Nonischemic cardiomyopathy.  Echo 10/20 w/ reduced LVEF 25-30% with mild LV dilation and mild LV hypertrophy, mildly decreased RV systolic function. Cause of cardiomyopathy may be long-standing uncontrolled HTN versus viral myocarditis. LHC/RHC showed normal filling pressures and cardiac output after diuresis, no coronary disease on angiography. No drugs/ETOH/smoking. Mother with "heart disease" but he is not clear on the diagnosis.  Echo in 1/21 with significant improvement, EF up to 55-60% with medical treatment. NYHA class I symptoms, euvolemic.  -ContinueEntresto 97/103 bid - No Lasix needed.   - Continue spironolactone25 mg daily. Recent BMET stable.  - With elevated BP, increase Coreg to 12.5 mg bid.    - Continue Bidil 2 tabs tid.  - Repeat echo with followup in 6 months to make sure that EF remains back to normal.  3. HTN: BP high today, increase Coreg as above.   Patient did undergo a CT coronary study which showed no coronary lesions. The patient states his chest pain he was having is more of heartburn in nature he has occasional belching and he states when he takes the Protonix this relieves the symptoms.  He is out of this medication at this time.  Note the patient is unvaccinated for Covid and has severe Covid vaccine  resistance.  Currently the patient denies any shortness of breath or other complaints.  On arrival blood pressure is perfect at 122/80.  Patient is no longer smoking or drinking alcohol.  Patient's weight has gone down but still has a BMI of 40 Patient's diet is more compliant  Patient's been compliant with all of his medications as noted above per cardiology visit.  We are refilling all his medications here at the clinic.  08/29/2020 Seen freq in ED this past month Patient seen today to reconnect for primary care with me while I am on the mobile medicine unit.  At the last visit in the emergency room as noted below April 16  ED Eval Patient with history of nonischemic cardiomyopathy.  Patient had cardiac cath in 2020 that was negative for obstructive disease. Patient had a  CT coronary in 2021 that was also negative Patient is frustrated that he keeps having chest pain, but he also thinks that there is a component of anxiety Patient does appear very anxious.  He is on no medications at this time for anxiety. At this time I feel there is very low likelihood for ACS/PE/dissection.  Essentially he is med compliant but still having significant hypertension which may be causing some of his chest pain 5:38 AM Patient resting comfortably and feels improved. He is in no acute distress at this time. He will be discharged home with close PCP follow-up as he may need further alterations in his blood pressure medications.  Short course of Ativan provided, but I advised patient this is not a long-term solution and may need other medications to manage his  Patient was given lorazepam to manage his anxiety at the ER.  He has been having chest pain he feels is precipitated by recent loss of his job.  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  Patient states he feels more stressed and when this occurs he gets  chest tightness.  He also has hypertension with this.  He states nitroglycerin does help this.  He denies shortness of breath or pedal edema.  He has no nausea or vomiting or abdominal pain.  Note he had a Department of Transportation physical and was told he should receive a sleep study.  He does not have insurance now has he is in between jobs.  Note he is also been skipping his BiDil doses.  He also has not had a follow-up with cardiology in some time.     Past Medical History:  Diagnosis Date  . Acute CHF (congestive heart failure) ([place]) 02/24/2019  . Anxiety   . Hypertension   . Obesity   . Peritonsillar abscess      Family History  Problem Relation Age of Onset  . Hyperlipidemia Mother   . Diabetes Mother   . Stroke Mother   . Hypertension Mother   . Hypertension Father      Social History   Socioeconomic History  . Marital status: Single    Spouse name: Not on file  . Number of children: Not on file  . Years of education: Not on file  . Highest education level: Not on file  Occupational History  . Not on file  Tobacco Use  . Smoking status: Former Research scientist (life sciences)  . Smokeless tobacco: Never Used  Vaping Use  . Vaping Use: Never used  Substance and Sexual Activity  . Alcohol use: No  . Drug use: No  . Sexual activity: Not on file  Other Topics Concern  . Not on file  Social History Narrative  . Not on file   Social Determinants of Health   Financial Resource Strain: Not on file  Food Insecurity: Not on file  Transportation Needs: Not on file  Physical Activity: Not on file  Stress: Not on file  Social Connections: Not on file  Intimate Partner Violence: Not on file     No Known Allergies   Outpatient Medications Prior to Visit  Medication Sig Dispense Refill  . LORazepam (ATIVAN) 1 MG tablet Take 1 tablet (1 mg total) by mouth every 8 (eight) hours as needed for anxiety. 10 tablet 0  . sacubitril-valsartan (ENTRESTO) 97-103 MG TAKE 1 TABLET BY MOUTH 2 (TWO)  TIMES DAILY. 180 tablet 3  . carvedilol (COREG) 12.5 MG tablet TAKE 1 TABLET (12.5 MG TOTAL) BY MOUTH 2 (TWO) TIMES DAILY WITH A MEAL. (Patient taking differently: Take 12.5 mg by mouth 2 (two) times daily with a meal.) 60 tablet 3  . isosorbide-hydrALAZINE (BIDIL) 20-37.5 MG tablet TAKE 2 TABLETS BY MOUTH 3 (THREE) TIMES DAILY. (Patient taking differently: Take 2 tablets by mouth in the morning and at bedtime.) 540 tablet 3  . pantoprazole (PROTONIX) 40 MG tablet TAKE 1 TABLET (40 MG TOTAL) BY MOUTH DAILY. (Patient taking differently: Take 40 mg by mouth daily.) 60 tablet 1  . potassium chloride SA (KLOR-CON) 20 MEQ tablet TAKE 1 TABLET (20 MEQ TOTAL) BY MOUTH 2 (TWO) TIMES DAILY. (Patient taking differently: Take 20 mEq by mouth 2 (two) times daily.) 60 tablet 3  . spironolactone (ALDACTONE) 25 MG tablet TAKE 1 TABLET (25 MG TOTAL) BY MOUTH DAILY. (Patient taking differently: Take 25 mg by mouth daily.) 90 tablet 1   No facility-administered medications prior to visit.     Review of Systems  Respiratory: Positive for chest tightness. Negative for shortness of breath.   Cardiovascular: Positive for chest pain. Negative for leg swelling.       No orthopnea, some qhs chest pain  Gastrointestinal: Negative.        Objective:   Physical Exam Vitals:   08/29/20 0922  BP: (!) 153/69  Pulse: 86  SpO2: 99%  Weight: 250 lb (113.4 kg)  Height: 5' 6"  (1.676 m)    Gen: Pleasant, well-nourished, in no distress,  normal affect  ENT: No lesions,  mouth clear,  oropharynx clear, no postnasal drip  Neck: No JVD, no TMG, no carotid bruits  Lungs: No use of accessory muscles, no dullness to percussion, clear without rales or rhonchi  Cardiovascular: RRR, heart sounds normal, no murmur or gallops, no peripheral edema  Abdomen: soft and NT, no HSM,  BS normal  Musculoskeletal: No deformities, no cyanosis or clubbing  Neuro: alert, non focal  Skin: Warm, no lesions or rashes  BMP Latest  Ref Rng & Units 08/24/2020 08/09/2020 07/23/2020  Glucose 70 - 99 mg/dL 120(H) 133(H) 100(H)  BUN 6 - 20 mg/dL 12 19 15   Creatinine 0.61 - 1.24 mg/dL 1.71(H) 1.62(H) 1.67(H)  Sodium 135 - 145 mmol/L 140 138 138  Potassium 3.5 - 5.1 mmol/L 4.0 3.7 3.8  Chloride 98 - 111 mmol/L 108 107 108  CO2 22 - 32 mmol/L 23 24 22   Calcium 8.9 - 10.3 mg/dL 9.0 8.6(L) 8.5(L)   Hepatic Function Latest Ref Rng & Units 04/12/2020 03/20/2020 07/21/2017  Total Protein 6.5 - 8.1 g/dL 7.8 7.3 7.1  Albumin 3.5 - 5.0 g/dL 4.0 3.7 3.5  AST 15 - 41 U/L 20 18 28   ALT 0 - 44 U/L 17 17 22   Alk Phosphatase 38 - 126 U/L 88 85 95  Total Bilirubin 0.3 - 1.2 mg/dL 0.3 0.4 0.4   CBC Latest Ref Rng & Units 08/24/2020 08/09/2020 07/23/2020  WBC 4.0 - 10.5 K/uL 7.3 8.7 6.9  Hemoglobin 13.0 - 17.0 g/dL 11.9(L) 11.4(L) 11.7(L)  Hematocrit 39.0 - 52.0 % 37.9(L) 35.9(L) 36.8(L)  Platelets 150 - 400 K/uL 257 246 275        Assessment & Plan:  I personally reviewed all images and lab data in the [facility] system as well as any outside material available during this office visit and agree with the  radiology impressions.   HTN (hypertension) Hypertension not  optimally controlled but note patient skipping BiDil doses  Plan to resume BiDil 3 times daily and continue other medications as prescribed refills given    Chronic systolic heart failure (HCC)due to Hypertension Patient appears compensated at this time with no excess fluid retention.  Plan is to withhold furosemide for now continue the spironolactone.  Patient continue potassium supplementation his potassium was 4.0 in the emergency room.  Plan to get patient back into the heart failure clinic ASAP  CKD (chronic kidney disease), stage II Renal function stable at this time  GAD (generalized anxiety disorder) Long-term history of generalized anxiety disorder we will prescribe hydroxyzine 25 mg 3 times daily as needed and discontinue further lorazepam  We will connect the  patient with our behavioral therapy clinician who on an interim basis is Ms. Mayers our physician assistant for mobile medicine   Karee was seen today for hypertension.  Diagnoses and all orders for this visit:  Chronic systolic heart failure (HCC)due to Hypertension -     AMB referral to CHF clinic  Primary hypertension  CKD (chronic kidney disease), stage II  GAD (generalized anxiety disorder)  Other orders -     carvedilol (COREG) 12.5 MG tablet; Take 1 tablet (12.5 mg total) by mouth 2 (two) times daily with a meal. -     isosorbide-hydrALAZINE (BIDIL) 20-37.5 MG tablet; TAKE 2 TABLETS BY MOUTH 3 (THREE) TIMES DAILY. -     pantoprazole (PROTONIX) 40 MG tablet; Take 1 tablet (40 mg total) by mouth daily. -     potassium chloride SA (KLOR-CON) 20 MEQ tablet; Take 1 tablet (20 mEq total) by mouth 2 (two) times daily. -     spironolactone (ALDACTONE) 25 MG tablet; Take 1 tablet (25 mg total) by mouth daily. -     nitroGLYCERIN (NITROSTAT) 0.4 MG SL tablet; Place 1 tablet (0.4 mg total) under the tongue every 5 (five) minutes as needed for chest pain. -     hydrOXYzine (VISTARIL) 25 MG capsule; Take 1 capsule (25 mg total) by mouth every 8 (eight) hours as needed for anxiety.  I spent 40 minutes on this patient including time reviewing records interviewing the patient examining formulating complex medical decision making.  This patient is extremely high risk and is high risk for recurrent ED utilization

## 2020-08-29 NOTE — Assessment & Plan Note (Signed)
Long-term history of generalized anxiety disorder we will prescribe hydroxyzine 25 mg 3 times daily as needed and discontinue further lorazepam  We will connect the patient with our behavioral therapy clinician who on an interim basis is Ms. Mayers our physician assistant for mobile medicine

## 2020-08-29 NOTE — Assessment & Plan Note (Signed)
Patient appears compensated at this time with no excess fluid retention.  Plan is to withhold furosemide for now continue the spironolactone.  Patient continue potassium supplementation his potassium was 4.0 in the emergency room.  Plan to get patient back into the heart failure clinic ASAP

## 2020-08-30 ENCOUNTER — Other Ambulatory Visit: Payer: Self-pay

## 2020-09-03 ENCOUNTER — Encounter (HOSPITAL_BASED_OUTPATIENT_CLINIC_OR_DEPARTMENT_OTHER): Payer: Self-pay | Admitting: *Deleted

## 2020-09-03 ENCOUNTER — Other Ambulatory Visit: Payer: Self-pay

## 2020-09-03 ENCOUNTER — Emergency Department (HOSPITAL_BASED_OUTPATIENT_CLINIC_OR_DEPARTMENT_OTHER)
Admission: EM | Admit: 2020-09-03 | Discharge: 2020-09-03 | Disposition: A | Discharge status: Home / Self Care | Payer: Self-pay | Attending: Emergency Medicine | Admitting: Emergency Medicine

## 2020-09-03 DIAGNOSIS — N182 Chronic kidney disease, stage 2 (mild): Secondary | ICD-10-CM | POA: Insufficient documentation

## 2020-09-03 DIAGNOSIS — I5022 Chronic systolic (congestive) heart failure: Secondary | ICD-10-CM | POA: Insufficient documentation

## 2020-09-03 DIAGNOSIS — Z79899 Other long term (current) drug therapy: Secondary | ICD-10-CM | POA: Insufficient documentation

## 2020-09-03 DIAGNOSIS — H1033 Unspecified acute conjunctivitis, bilateral: Secondary | ICD-10-CM | POA: Insufficient documentation

## 2020-09-03 DIAGNOSIS — Z87891 Personal history of nicotine dependence: Secondary | ICD-10-CM | POA: Insufficient documentation

## 2020-09-03 DIAGNOSIS — I130 Hypertensive heart and chronic kidney disease with heart failure and stage 1 through stage 4 chronic kidney disease, or unspecified chronic kidney disease: Secondary | ICD-10-CM | POA: Insufficient documentation

## 2020-09-03 MED ORDER — POLYMYXIN B-TRIMETHOPRIM 10000-0.1 UNIT/ML-% OP SOLN: 2.0000 [drp] | OPHTHALMIC | 0 refills | Status: DC

## 2020-09-03 NOTE — ED Triage Notes (Signed)
C/o eye drainage redness x 3 days

## 2020-09-03 NOTE — ED Provider Notes (Signed)
[facility] EMERGENCY DEPARTMENT Provider Note   CSN: [id number] Arrival date & time: 09/03/20  1829     History Chief Complaint  Patient presents with  . Eye Problem    Derek Reed is a 35 y.o. male.  35 year old male with history of hypertension and CHF presents with complaint of bilateral eye redness, itching, drainage.  Patient states that he did not sleep well last night secondary to his eyes bothering him, woke up this morning with his eyes matted shut.  Denies foreign body in the eye or injury to the eyes, does not wear glasses or contacts.  No known exposure to anyone with pinkeye.  No other complaints or concerns.        Past Medical History:  Diagnosis Date  . Acute CHF (congestive heart failure) (HCC) 02/24/2019  . Anxiety   . Hypertension   . Obesity   . Peritonsillar abscess     Patient Active Problem List   Diagnosis Date Noted  . HTN (hypertension) 02/24/2019  . 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 GAD (generalized anxiety disorder) 03/26/2014  . BMI 36.0-36.9,adult 03/26/2014    Past Surgical History:  Procedure Laterality Date  . INCISION AND DRAINAGE OF PERITONSILLAR ABCESS    . RIGHT/LEFT HEART CATH AND CORONARY ANGIOGRAPHY N/A 02/24/2019   Procedure: RIGHT/LEFT HEART CATH AND CORONARY ANGIOGRAPHY;  Surgeon: Laurey Morale, MD;  Location: [facility] INVASIVE CV LAB;  Service: Cardiovascular;  Laterality: N/A;       Family History  Problem Relation Age of Onset  . Hyperlipidemia Mother   . Diabetes Mother   . Stroke Mother   . Hypertension Mother   . Hypertension Father     Social History   Tobacco Use  . Smoking status: Former Games developer  . Smokeless tobacco: Never Used  Vaping Use  . Vaping Use: Never used  Substance Use Topics  . Alcohol use: No  . Drug use: No    Home Medications Prior to Admission medications   Medication Sig Start Date End Date  Taking? Authorizing Provider  trimethoprim-polymyxin b (POLYTRIM) ophthalmic solution Place 2 drops into both eyes every 4 (four) hours. 09/03/20  Yes Jeannie Fend, PA-C  carvedilol (COREG) 12.5 MG tablet Take 1 tablet (12.5 mg total) by mouth 2 (two) times daily with a meal. 08/29/20 08/29/21  Storm Frisk, MD  hydrOXYzine (VISTARIL) 25 MG capsule Take 1 capsule (25 mg total) by mouth every 8 (eight) hours as needed for anxiety. 08/29/20   Storm Frisk, MD  isosorbide-hydrALAZINE (BIDIL) 20-37.5 MG tablet TAKE 2 TABLETS BY MOUTH 3 (THREE) TIMES DAILY. 08/29/20 08/29/21  Storm Frisk, MD  LORazepam (ATIVAN) 1 MG tablet Take 1 tablet (1 mg total) by mouth every 8 (eight) hours as needed for anxiety. 08/24/20   Zadie Rhine, MD  nitroGLYCERIN (NITROSTAT) 0.4 MG SL tablet Place 1 tablet (0.4 mg total) under the tongue every 5 (five) minutes as needed for chest pain. 08/29/20   Storm Frisk, MD  pantoprazole (PROTONIX) 40 MG tablet Take 1 tablet (40 mg total) by mouth daily. 08/29/20 08/29/21  Storm Frisk, MD  potassium chloride SA (KLOR-CON) 20 MEQ tablet Take 1 tablet (20 mEq total) by mouth 2 (two) times daily. 08/29/20 08/29/21  Storm Frisk, MD  sacubitril-valsartan (ENTRESTO) 97-103 MG TAKE 1 TABLET BY MOUTH 2 (TWO) TIMES DAILY. 06/04/20 06/04/21  Storm Frisk, MD  spironolactone (ALDACTONE) 25 MG tablet Take 1 tablet (25 mg total) by mouth daily. 08/29/20 08/29/21  Storm Frisk, MD    Allergies    Patient has no known allergies.  Review of Systems   Review of Systems  Constitutional: Negative for fever.  HENT: Negative for congestion and sore throat.   Eyes: Positive for discharge, redness and itching. Negative for visual disturbance.  Respiratory: Negative for cough.   Skin: Negative for rash and wound.  Allergic/Immunologic: Negative for immunocompromised state.  Neurological: Negative for headaches.  Hematological: Negative for adenopathy.    Physical  Exam Updated Vital Signs BP (!) 139/99 (BP Location: Left Arm)   Pulse 75   Temp 98.3 F (36.8 C) (Oral)   Resp 18   Ht 5\' 6"  (1.676 m)   Wt 113.4 kg   SpO2 96%   BMI 40.35 kg/m   Physical Exam Vitals and nursing note reviewed.  Constitutional:      General: He is not in acute distress.    Appearance: He is well-developed. He is not diaphoretic.  HENT:     Head: Normocephalic and atraumatic.     Nose: Nose normal.     Mouth/Throat:     Mouth: Mucous membranes are moist.  Eyes:     Extraocular Movements: Extraocular movements intact.     Conjunctiva/sclera:     Right eye: Right conjunctiva is injected.     Left eye: Left conjunctiva is injected.     Pupils: Pupils are equal, round, and reactive to light.  Pulmonary:     Effort: Pulmonary effort is normal.  Musculoskeletal:     Cervical back: Neck supple.  Lymphadenopathy:     Cervical: No cervical adenopathy.  Skin:    General: Skin is warm and dry.     Findings: No erythema or rash.  Neurological:     Mental Status: He is alert and oriented to person, place, and time.  Psychiatric:        Behavior: Behavior normal.     ED Results / Procedures / Treatments   Labs (all labs ordered are listed, but only abnormal results are displayed) Labs Reviewed - No data to display  EKG None  Radiology No results found.  Procedures Procedures   Medications Ordered in ED Medications - No data to display  ED Course  I have reviewed the triage vital signs and the nursing notes.  Pertinent labs & imaging results that were available during my care of the patient were reviewed by me and considered in my medical decision making (see chart for details).  Clinical Course as of 09/03/20 1901  Tue Sep 03, 2020  [id number] 35 year old male with complaint of itchy, red eyes with purulent drainage this morning.  Exam, has injection to both eyes, no active drainage at this time.  Plan is to treat with antibiotic drops.  Advised to add  Zyrtec if not improving after 48 hours.  Return to ED for worsening or concerning symptoms. [LM]    Clinical Course User Index [LM] 30   MDM Rules/Calculators/A&P                          Final Clinical Impression(s) / ED Diagnoses Final diagnoses:  Acute bacterial conjunctivitis of both eyes    Rx / DC Orders ED Discharge Orders         Ordered    trimethoprim-polymyxin b (POLYTRIM) ophthalmic solution  Every 4  hours        09/03/20 1858           Jeannie Fend, PA-C 09/03/20 1901    Milagros Loll, MD 09/03/20 [phone]

## 2020-09-03 NOTE — ED Notes (Signed)
Education provided regarding hygiene, preventing spread of infection

## 2020-09-13 ENCOUNTER — Other Ambulatory Visit: Payer: Self-pay

## 2020-09-27 ENCOUNTER — Other Ambulatory Visit: Payer: Self-pay

## 2020-09-29 NOTE — Progress Notes (Signed)
Subjective:    Patient ID: Derek Reed, male    DOB: 1985/07/04, 35 y.o.   MRN: [id number]  03/2019 35 y.o.M with telemedicine phone visit for CHF f/u and to establish for Primary care . This patient was admitted in October for acute pulmonary edema and hypertensive cardiomyopathy discharge summary is as below  Admit date:02/23/2019 Discharge date:02/26/2019  Time spent:53mnutes  Recommendations for Outpatient Follow-up: 1.         [facility], please check be met at follow-up   Discharge Diagnoses: Principal Problem: Acute systolic CHF Hypertensive cardiomyopathy Borderline diabetes Hypokalemia HTN (hypertension) Elevated troponin CKD (chronic kidney disease), stage II Anxiety Acute systolic CHF (congestive heart failure) ([facility]   Discharge Condition:Stable  Diet recommendation:Carb modified, heart healthy                                    Filed Weights            02/24/19 0528 02/25/19 0616 02/26/19 0554 Weight:            105.1 kg          108.8 kg          105.1 kg   History of present illness: Derek Hooperis a 35y.o.malewith medical history significant ofhypertension not taking medications, CKD-2, peritonsillar abscess, anxiety, who presented with shortness of breath, for more than 2 weeks. He has dry cough. - COVID-19 test was negative, EKG noted LVH Chest x-ray showed increased cardiac silhouette and interstitial edema   Hospital Course:  Acute CHF/new onset -Hypertensive cardiomyopathy, has LVH on EKG -Clinically improved with diuresis, he was -5 L at discharge -2D echo with EF of 25 to 30%,cardiology consultunderwent left heart cath 10/16which showed minimal nonobstructive disease, consistent with nonischemic cardiomyopathy -Transition to oral Lasix, Entresto, Coreg and Aldactone at discharge -Seen by case management for patient assistance, he was given a mSystems developerand  EDelene Lollcard -Educated regarding diet and lifestyle modification  Elevated troponin: -Troponin156 -->153. 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  Since discharge the patient has been in the advanced heart failure clinic for follow-up as below OP CV f/u 10/27: Derek Reed with PMH of untreated HTN [facility]. He presented with acute dyspnea and was found to be markedly hypertensive and in acute pulmonary edema. He was started on IV diuretics and antihypertensives. 2D echo showed severely reduced LVEF25-30% with mild LV dilation and mild LV hypertrophy, mildly decreased RV systolic function. LHC/RHC showed normal filling pressures and cardiac output after diuresis, no coronary disease on angiography. Noh/odrugs/ETOH/smoking. Mother with "heart disease" but he is not clear on the diagnosis.   Primary etiology for his HF was felt to be poorly controlled HTN. After he was diuresed back to euvolemic state, he was transitioned to PO lasix.  Also treated w/ Entresto, Spironolactone and Coreg. Discharge weight was 231 lb.   He presents to clinic today for post hospital f/u. Doing well. Denies exertional dyspnea. No weight gain or LEE. Compliant w/ meds. Tolerating well. No side effects. Has modified diet. Trying to eat healthier and reading food  labels. Trying to keep Na intake under 1500 mg/day. Office weight 233 lb today. BP 122/80.  1. Systolic Heart Failure: Recent Echo 10/20 w/ reduced LVEF 25-30% with mild LV dilation and mild LV hypertrophy, mildly decreased RV systolic function.  Cause of cardiomyopathy may be long-standing uncontrolled HTN. LHC/RHC showed normal filling pressures and cardiac output after diuresis, no coronary disease on angiography. No drugs/ETOH/smoking. Mother with "heart disease" but he is not clear on the diagnosis. -NYHA Class II, euvolemic on exam. Wt stable compared to recent hospital d/c wt -BP improved. 122/80 today.  -Check BMP today -ContinueEntresto to49-51bid - Reduce Lasix to 20 mg daily - Continue spironolactone25 mg daily.  - ContinueCoreg 3.125 mg bid.  -Add Bidil 1 tablet tid - we discussed importance of daily wts and low sodium diet  - f/u w/ PharmD in 3 weeks for further med titration. -will ultimately need repeat echo 3 months after med optimization  2.GYF:VCBSWHQ improved. 122/80 today. -HF meds per above.   At the last cardiology visit the patient was to continue Entresto twice daily current dose but to reduce Lasix to 20 mg daily and begin BiDil 1 tablet 3 times daily along with continuing Coreg and spironolactone.  The patient states he did not obtain the BiDil as he cannot afford it at his private pharmacy as he is self-pay status.  The patient states his breathing is better he is having no cough or chest pain he has no edema in the lower extremities.  He is taking all his medications as prescribed.  He had questions on what to do with the potassium dose as he is about out.  Note his potassium level is 4.6 at the last cardiology visit and he is on Aldactone.  04/06/19 Since the last visit which was a telephone visit this is a face-to-face visit.  The patient is improved with less shortness of breath no edema no chest pain.  He has been maintaining his  medications appropriately.  Note his blood pressure today was 120/80 on arrival.  He has increased his furosemide to 40 mg daily There is no other change in his medication at this time except he has been started on the BiDil  06/06/2019 Patient is seen in follow-up for chronic systolic heart failure due to hypertension.  He recently saw cardiology and had his Coreg dose increased to 6.25 mg twice daily he is also on a higher dose of Entresto maintains Aldactone potassium supplement and BiDil he is only taking furosemide as needed at this time. Note he went to the emergency room on 19 January for an abscess behind the left ear and had it incised and drained and is finishing a course of doxycycline  The patient does relate today that he has significant amounts of stress and anxiety at this time  The patient's not drinking alcohol any longer and does not use tobacco products  Recent echocardiogram was repeated and showed ejection fraction up to 55 to 60% which is markedly improved from prior values  Blood pressure today is at 145/85 and is improved control   09/27/2019 The patient was seen in return follow-up for chronic systolic heart failure and hypertension.  The patient's  been compliant with all of his medications.  He denies chest pain or shortness of breath.  He is now off diuretics.  He is following up with the heart failure clinic and maintaining all medications.  Currently the patient maintains Coreg 6.25 mg twice daily Entresto 97/1031 twice daily Aldactone 25 mg daily and BiDil at 20/37 2 tablets 3 times daily he also continues with potassium supplementation  The patient started a job working at [employer] to [employer] with this he continue with patient assistance with Delene Loll  06/04/2020 This patient is seen today for primary care follow-up last seen in May 2021.  Patient's last heart failure visit in clinic was November 2021 with Dr. Algernon Huxley.  At that visit  documentation is as noted below  Last seen CHF clinic 03/2020: 1. HTN 2. Chronic systolic CHF: Nonischemic cardiomyopathy.  - Echo (10/20): EF 25-30%, mildly decreased RV systolic function.  - LHC/RHC (10/20): No significant CAD.  Mean RA 3, PA 32/10, mean PCWP 12, CI 2.69.  - Echo (1/21): EF 55-60%, mild LVH, normal RV.   Chest pain: Patient had cath in 2020 without significant CAD.  He has had several episodes of substernal chest tightness, sometimes with exertion and sometimes at rest.  ECG shows LVH with likely repolarization abnormality.   - Given ongoing symptoms, will arrange for coronary CT angiogram to rule out obstructive CAD.  2. Chronic systolic CHF: Nonischemic cardiomyopathy.  Echo 10/20 w/ reduced LVEF 25-30% with mild LV dilation and mild LV hypertrophy, mildly decreased RV systolic function. Cause of cardiomyopathy may be long-standing uncontrolled HTN versus viral myocarditis. LHC/RHC showed normal filling pressures and cardiac output after diuresis, no coronary disease on angiography. No drugs/ETOH/smoking. Mother with "heart disease" but he is not clear on the diagnosis.  Echo in 1/21 with significant improvement, EF up to 55-60% with medical treatment. NYHA class I symptoms, euvolemic.  -ContinueEntresto 97/103 bid - No Lasix needed.   - Continue spironolactone25 mg daily. Recent BMET stable.  - With elevated BP, increase Coreg to 12.5 mg bid.    - Continue Bidil 2 tabs tid.  - Repeat echo with followup in 6 months to make sure that EF remains back to normal.  3. HTN: BP high today, increase Coreg as above.   Patient did undergo a CT coronary study which showed no coronary lesions. The patient states his chest pain he was having is more of heartburn in nature he has occasional belching and he states when he takes the Protonix this relieves the symptoms.  He is out of this medication at this time.  Note the patient is unvaccinated for Covid and has severe Covid  vaccine resistance.  Currently the patient denies any shortness of breath or other complaints.  On arrival blood pressure is perfect at 122/80.  Patient is no longer smoking or drinking alcohol.  Patient's weight has gone down but still has a BMI of 40 Patient's diet is more compliant  Patient's been compliant with all of his medications as noted above per cardiology visit.  We are refilling all his medications here at the clinic.  08/29/2020 Seen freq in ED this past month Patient seen today to reconnect for primary care with me while I am on the mobile medicine unit.  At the last visit in the emergency room as noted below April 16  ED Eval Patient with history of nonischemic cardiomyopathy.  Patient had cardiac cath in 2020 that was negative for obstructive disease. Patient had a  CT coronary in 2021 that was also negative Patient is frustrated that he keeps having chest pain, but he also thinks that there is a component of anxiety Patient does appear very anxious.  He is on no medications at this time for anxiety. At this time I feel there is very low likelihood for ACS/PE/dissection.  Essentially he is med compliant but still having significant hypertension which may be causing some of his chest pain 5:38 AM Patient resting comfortably and feels improved. He is in no acute distress at this time. He will be discharged home with close PCP follow-up as he may need further alterations in his blood pressure medications.  Short course of Ativan provided, but I advised patient this is not a long-term solution and may need other medications to manage his  Patient was given lorazepam to manage his anxiety at the ER.  He has been having chest pain he feels is precipitated by recent loss of his job.  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  Patient states he feels more stressed and when this occurs he  gets chest tightness.  He also has hypertension with this.  He states nitroglycerin does help this.  He denies shortness of breath or pedal edema.  He has no nausea or vomiting or abdominal pain.  Note he had a Department of Transportation physical and was told he should receive a sleep study.  He does not have insurance now has he is in between jobs.  Note he is also been skipping his BiDil doses.  He also has not had a follow-up with cardiology in some time.  5/23 This patient is seen face-to-face back in follow-up for chronic systolic heart failure hypertension chronic kidney disease stage II and generalized anxiety disorder.  Patient on arrival has a blood pressure 129/83 and has been compliant with medications.  He states that he still has difficulty with sleep and has significant anxiety.  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  Patient denies any shortness of breath or chest pain.  Note he is now using his BiDil 3 times daily as prescribed which is helped.  He had been on hydroxyzine 25 mg 3 times daily as needed and he states this helps to some degree.  He is yet to see a mental health provider. Patient was in the ED recently for bacterial conjunctivitis which has resolved.   Past Medical History:  Diagnosis Date  . Acute CHF (congestive heart failure) ([place]) 02/24/2019  . Anxiety   . Hypertension   . Obesity   . Peritonsillar abscess      Family History  Problem Relation Age of Onset  . Hyperlipidemia Mother   . Diabetes Mother   . Stroke Mother   . Hypertension Mother   . Hypertension Father      Social History   Socioeconomic History  . Marital status: Single    Spouse name: Not on file  . Number of children: Not on file  . Years of education: Not on file  . Highest education level: Not on file  Occupational History  . Not on file  Tobacco Use  . Smoking status: Former Research scientist (life sciences)  . Smokeless tobacco: Never Used  Vaping Use  . Vaping Use: Never used   Substance and Sexual Activity  . Alcohol use: No  . Drug use:  No  . Sexual activity: Not on file  Other Topics Concern  . Not on file  Social History Narrative  . Not on file   Social Determinants of Health   Financial Resource Strain: Not on file  Food Insecurity: Not on file  Transportation Needs: Not on file  Physical Activity: Not on file  Stress: Not on file  Social Connections: Not on file  Intimate Partner Violence: Not on file     No Known Allergies   Outpatient Medications Prior to Visit  Medication Sig Dispense Refill  . hydrOXYzine (VISTARIL) 25 MG capsule Take 1 capsule (25 mg total) by mouth every 8 (eight) hours as needed for anxiety. 60 capsule 1  . isosorbide-hydrALAZINE (BIDIL) 20-37.5 MG tablet TAKE 2 TABLETS BY MOUTH 3 (THREE) TIMES DAILY. 540 tablet 3  . nitroGLYCERIN (NITROSTAT) 0.4 MG SL tablet Place 1 tablet (0.4 mg total) under the tongue every 5 (five) minutes as needed for chest pain. 30 tablet 3  . potassium chloride SA (KLOR-CON) 20 MEQ tablet Take 1 tablet (20 mEq total) by mouth 2 (two) times daily. 60 tablet 3  . sacubitril-valsartan (ENTRESTO) 97-103 MG TAKE 1 TABLET BY MOUTH 2 (TWO) TIMES DAILY. 180 tablet 3  . spironolactone (ALDACTONE) 25 MG tablet Take 1 tablet (25 mg total) by mouth daily. 90 tablet 1  . carvedilol (COREG) 12.5 MG tablet Take 1 tablet (12.5 mg total) by mouth 2 (two) times daily with a meal. 60 tablet 3  . pantoprazole (PROTONIX) 40 MG tablet Take 1 tablet (40 mg total) by mouth daily. 30 tablet 2  . LORazepam (ATIVAN) 1 MG tablet Take 1 tablet (1 mg total) by mouth every 8 (eight) hours as needed for anxiety. (Patient not taking: Reported on 09/30/2020) 10 tablet 0  . trimethoprim-polymyxin b (POLYTRIM) ophthalmic solution Place 2 drops into both eyes every 4 (four) hours. (Patient not taking: Reported on 09/30/2020) 10 mL 0   No facility-administered medications prior to visit.     Review of Systems  HENT: Negative.    Respiratory: Negative for chest tightness and shortness of breath.   Cardiovascular: Negative for chest pain and leg swelling.       No orthopnea,   Gastrointestinal: Negative.   Skin: Negative for rash.  Psychiatric/Behavioral: Positive for sleep disturbance. The patient is nervous/anxious.        Objective:   Physical Exam Vitals:   09/30/20 1014  BP: 129/83  Pulse: 70  SpO2: 98%  Weight: 255 lb (115.7 kg)  Height: 5' 6"  (1.676 m)    Gen: Pleasant, well-nourished, in no distress,  normal affect  ENT: No lesions,  mouth clear,  oropharynx clear, no postnasal drip  Neck: No JVD, no TMG, no carotid bruits  Lungs: No use of accessory muscles, no dullness to percussion, clear without rales or rhonchi  Cardiovascular: RRR, heart sounds normal, no murmur or gallops, no peripheral edema  Abdomen: soft and NT, no HSM,  BS normal  Musculoskeletal: No deformities, no cyanosis or clubbing  Neuro: alert, non focal  Skin: Warm, no lesions or rashes  BMP Latest Ref Rng & Units 08/24/2020 08/09/2020 07/23/2020  Glucose 70 - 99 mg/dL 120(H) 133(H) 100(H)  BUN 6 - 20 mg/dL 12 19 15   Creatinine 0.61 - 1.24 mg/dL 1.71(H) 1.62(H) 1.67(H)  Sodium 135 - 145 mmol/L 140 138 138  Potassium 3.5 - 5.1 mmol/L 4.0 3.7 3.8  Chloride 98 - 111 mmol/L 108 107 108  CO2 22 - 32  mmol/L 23 24 22   Calcium 8.9 - 10.3 mg/dL 9.0 8.6(L) 8.5(L)   Hepatic Function Latest Ref Rng & Units 04/12/2020 03/20/2020 07/21/2017  Total Protein 6.5 - 8.1 g/dL 7.8 7.3 7.1  Albumin 3.5 - 5.0 g/dL 4.0 3.7 3.5  AST 15 - 41 U/L 20 18 28   ALT 0 - 44 U/L 17 17 22   Alk Phosphatase 38 - 126 U/L 88 85 95  Total Bilirubin 0.3 - 1.2 mg/dL 0.3 0.4 0.4   CBC Latest Ref Rng & Units 08/24/2020 08/09/2020 07/23/2020  WBC 4.0 - 10.5 K/uL 7.3 8.7 6.9  Hemoglobin 13.0 - 17.0 g/dL 11.9(L) 11.4(L) 11.7(L)  Hematocrit 39.0 - 52.0 % 37.9(L) 35.9(L) 36.8(L)  Platelets 150 - 400 K/uL 257 246 275        Assessment & Plan:  I personally  reviewed all images and lab data in the [facility] system as well as any outside material available during this office visit and agree with the  radiology impressions.   HTN (hypertension) Well-controlled no change in medications  Chronic systolic heart failure (HCC)due to Hypertension Chronic systolic heart failure stable at this time Check metabolic panel  No change in medication dosing refills given   CKD (chronic kidney disease), stage II Stable and will reassess metabolic panel  GAD (generalized anxiety disorder) This is playing a significant role in the patient's disease complex I am going to refer this patient to the mental health clinic and he was given follow-up appointments  BMI 36.0-36.9,adult Dietary recommendations given   [place] was seen today for chest pain.  Diagnoses and all orders for this visit:  Chronic systolic heart failure (HCC)due to Hypertension -     Basic Metabolic Panel  CKD (chronic kidney disease), stage II -     Basic Metabolic Panel  Primary hypertension  GAD (generalized anxiety disorder)  BMI 36.0-36.9,adult  Other orders -     carvedilol (COREG) 12.5 MG tablet; Take 1 tablet (12.5 mg total) by mouth 2 (two) times daily with a meal. -     pantoprazole (PROTONIX) 40 MG tablet; Take 1 tablet (40 mg total) by mouth daily.  I spent 36 minutes on this patient including time reviewing records interviewing the patient examining formulating complex medical decision making.  This patient is extremely high risk and is high risk for recurrent ED utilization

## 2020-09-30 ENCOUNTER — Other Ambulatory Visit: Payer: Self-pay

## 2020-09-30 ENCOUNTER — Encounter: Payer: Self-pay | Admitting: Critical Care Medicine

## 2020-09-30 ENCOUNTER — Ambulatory Visit: Payer: Self-pay | Attending: Critical Care Medicine | Admitting: Critical Care Medicine

## 2020-09-30 VITALS — BP 129/83 | HR 70 | Ht 66.0 in | Wt 255.0 lb

## 2020-09-30 DIAGNOSIS — N182 Chronic kidney disease, stage 2 (mild): Secondary | ICD-10-CM

## 2020-09-30 DIAGNOSIS — I5022 Chronic systolic (congestive) heart failure: Secondary | ICD-10-CM

## 2020-09-30 DIAGNOSIS — F411 Generalized anxiety disorder: Secondary | ICD-10-CM

## 2020-09-30 DIAGNOSIS — I10 Essential (primary) hypertension: Secondary | ICD-10-CM

## 2020-09-30 DIAGNOSIS — Z6836 Body mass index (BMI) 36.0-36.9, adult: Secondary | ICD-10-CM

## 2020-09-30 MED ORDER — CARVEDILOL 12.5 MG PO TABS
12.5000 mg | ORAL_TABLET | Freq: Two times a day (BID) | ORAL | 3 refills | Status: DC
  Filled 2020-09-30 – 2020-10-29 (×2): qty 60, 30d supply, fill #0
  Filled 2020-11-27: qty 60, 30d supply, fill #1
  Filled 2020-12-30: qty 60, 30d supply, fill #2

## 2020-09-30 MED ORDER — PANTOPRAZOLE SODIUM 40 MG PO TBEC
40.0000 mg | DELAYED_RELEASE_TABLET | Freq: Every day | ORAL | 2 refills | Status: DC
  Filled 2020-09-30 – 2020-10-15 (×2): qty 30, 30d supply, fill #0
  Filled 2020-11-13 – 2020-11-14 (×2): qty 30, 30d supply, fill #1

## 2020-09-30 NOTE — Assessment & Plan Note (Signed)
Well-controlled no change in medications 

## 2020-09-30 NOTE — Progress Notes (Signed)
Not having active chest pain today. Anxiety

## 2020-09-30 NOTE — Assessment & Plan Note (Signed)
This is playing a significant role in the patient's disease complex I am going to refer this patient to the mental health clinic and he was given follow-up appointments

## 2020-09-30 NOTE — Patient Instructions (Signed)
Please go to the open access today at the [facility] behavioral health center on [address], this is on Fridays and you arrive at 7:30 in the morning and wait in line and they will see you as a walk-in and get you establish for mental health care  Continue to use the hydroxyzine as needed for severe anxiety and they will assess you for regular medications at this behavioral health clinic  No change in your heart medicines  Labs today include metabolic profile to check for potassium levels  Remember our discussion about the COVID condition if you get COVID let us know and get tested right away as we now have oral treatments for this since you have chosen to remain unvaccinated  Return to see Dr. Delford Field 3 months

## 2020-09-30 NOTE — Assessment & Plan Note (Signed)
Dietary recommendations given 

## 2020-09-30 NOTE — Assessment & Plan Note (Signed)
Stable and will reassess metabolic panel

## 2020-09-30 NOTE — Assessment & Plan Note (Signed)
Chronic systolic heart failure stable at this time Check metabolic panel  No change in medication dosing refills given

## 2020-10-01 ENCOUNTER — Telehealth: Payer: Self-pay

## 2020-10-01 LAB — BASIC METABOLIC PANEL
BUN/Creatinine Ratio: 9 (ref 9–20)
BUN: 15 mg/dL (ref 6–20)
CO2: 21 mmol/L (ref 20–29)
Calcium: 9.3 mg/dL (ref 8.7–10.2)
Chloride: 104 mmol/L (ref 96–106)
Creatinine, Ser: 1.61 mg/dL — ABNORMAL HIGH (ref 0.76–1.27)
Glucose: 103 mg/dL — ABNORMAL HIGH (ref 65–99)
Potassium: 4.4 mmol/L (ref 3.5–5.2)
Sodium: 141 mmol/L (ref 134–144)
eGFR: 56 mL/min/{1.73_m2} — ABNORMAL LOW (ref >59)

## 2020-10-01 NOTE — Telephone Encounter (Signed)
Contacted pt to go over lab results pt didn't answer lvm  

## 2020-10-09 ENCOUNTER — Emergency Department (HOSPITAL_BASED_OUTPATIENT_CLINIC_OR_DEPARTMENT_OTHER): Payer: Self-pay

## 2020-10-09 ENCOUNTER — Emergency Department (HOSPITAL_BASED_OUTPATIENT_CLINIC_OR_DEPARTMENT_OTHER)
Admission: EM | Admit: 2020-10-09 | Discharge: 2020-10-09 | Disposition: A | Discharge status: Home / Self Care | Payer: Self-pay | Attending: Emergency Medicine | Admitting: Emergency Medicine

## 2020-10-09 ENCOUNTER — Encounter (HOSPITAL_BASED_OUTPATIENT_CLINIC_OR_DEPARTMENT_OTHER): Payer: Self-pay | Admitting: Emergency Medicine

## 2020-10-09 ENCOUNTER — Other Ambulatory Visit: Payer: Self-pay

## 2020-10-09 DIAGNOSIS — M7522 Bicipital tendinitis, left shoulder: Secondary | ICD-10-CM | POA: Insufficient documentation

## 2020-10-09 DIAGNOSIS — I5022 Chronic systolic (congestive) heart failure: Secondary | ICD-10-CM | POA: Insufficient documentation

## 2020-10-09 DIAGNOSIS — Z87891 Personal history of nicotine dependence: Secondary | ICD-10-CM | POA: Insufficient documentation

## 2020-10-09 DIAGNOSIS — M778 Other enthesopathies, not elsewhere classified: Secondary | ICD-10-CM

## 2020-10-09 DIAGNOSIS — N182 Chronic kidney disease, stage 2 (mild): Secondary | ICD-10-CM | POA: Insufficient documentation

## 2020-10-09 DIAGNOSIS — I130 Hypertensive heart and chronic kidney disease with heart failure and stage 1 through stage 4 chronic kidney disease, or unspecified chronic kidney disease: Secondary | ICD-10-CM | POA: Insufficient documentation

## 2020-10-09 MED ORDER — PREDNISONE 10 MG PO TABS
ORAL_TABLET | ORAL | 0 refills | Status: DC
  Filled 2020-10-09: qty 42, 12d supply, fill #0

## 2020-10-09 MED ORDER — KETOROLAC TROMETHAMINE 30 MG/ML IJ SOLN
30.0000 mg | Freq: Once | INTRAMUSCULAR | Status: AC
Start: 2020-10-09 — End: 2020-10-09
  Administered 2020-10-09: 30 mg via INTRAMUSCULAR
  Filled 2020-10-09: qty 1

## 2020-10-09 MED ORDER — DEXAMETHASONE SODIUM PHOSPHATE 10 MG/ML IJ SOLN
10.0000 mg | Freq: Once | INTRAMUSCULAR | Status: AC
  Administered 2020-10-09: 10 mg via INTRAMUSCULAR
  Filled 2020-10-09: qty 1

## 2020-10-09 MED ORDER — IBUPROFEN 600 MG PO TABS
600.0000 mg | ORAL_TABLET | Freq: Four times a day (QID) | ORAL | 0 refills | Status: DC | PRN
  Filled 2020-10-09: qty 30, 8d supply, fill #0

## 2020-10-09 NOTE — ED Triage Notes (Signed)
Patient presents with complaints of left shoulder pain; states onset 5 days ago; worse today; denies taking any otc meds pta.

## 2020-10-09 NOTE — ED Provider Notes (Signed)
[facility] EMERGENCY DEPARTMENT Provider Note   CSN: [id number] Arrival date & time: 10/09/20  1952     History Chief Complaint  Patient presents with  . Shoulder Pain    Derek Reed is a 35 y.o. male.  Pt presents to the ED today with left shoulder pain.  Pt said he's had it there for about 5 days.  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  Pt denies any other sx.        Past Medical History:  Diagnosis Date  . Acute CHF (congestive heart failure) (HCC) 02/24/2019  . Anxiety   . Hypertension   . Obesity   . Peritonsillar abscess     Patient Active Problem List   Diagnosis Date Noted  . HTN (hypertension) 02/24/2019  . 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 GAD (generalized anxiety disorder) 03/26/2014  . BMI 36.0-36.9,adult 03/26/2014    Past Surgical History:  Procedure Laterality Date  . INCISION AND DRAINAGE OF PERITONSILLAR ABCESS    . RIGHT/LEFT HEART CATH AND CORONARY ANGIOGRAPHY N/A 02/24/2019   Procedure: RIGHT/LEFT HEART CATH AND CORONARY ANGIOGRAPHY;  Surgeon: Laurey Morale, MD;  [facility];  Service: Cardiovascular;  Laterality: N/A;       Family History  Problem Relation Age of Onset  . Hyperlipidemia Mother   . Diabetes Mother   . Stroke Mother   . Hypertension Mother   . Hypertension Father     Social History   Tobacco Use  . Smoking status: Former Games developer  . Smokeless tobacco: Never Used  Vaping Use  . Vaping Use: Never used  Substance Use Topics  . Alcohol use: No  . Drug use: No    Home Medications Prior to Admission medications   Medication Sig Start Date End Date Taking? Authorizing Provider  ibuprofen (ADVIL) 600 MG tablet Take 1 tablet (600 mg total) by mouth every 6 (six) hours as needed. 10/09/20  Yes Jacalyn Lefevre, MD  predniSONE (STERAPRED  UNI-PAK 21 TAB) 10 MG (21) TBPK tablet Take 6 tabs for 2 days, then 5 for 2 days, then 4 for 2 days, then 3 for 2 days, 2 for 2 days, then 1 for 2 days 10/09/20  Yes Jacalyn Lefevre, MD  carvedilol (COREG) 12.5 MG tablet Take 1 tablet (12.5 mg total) by mouth 2 (two) times daily with a meal. 09/30/20 09/30/21  Storm Frisk, MD  hydrOXYzine (VISTARIL) 25 MG capsule Take 1 capsule (25 mg total) by mouth every 8 (eight) hours as needed for anxiety. 08/29/20   Storm Frisk, MD  isosorbide-hydrALAZINE (BIDIL) 20-37.5 MG tablet TAKE 2 TABLETS BY MOUTH 3 (THREE) TIMES DAILY. 08/29/20 08/29/21  Storm Frisk, MD  nitroGLYCERIN (NITROSTAT) 0.4 MG SL tablet Place 1 tablet (0.4 mg total) under the tongue every 5 (five) minutes as needed for chest pain. 08/29/20   Storm Frisk, MD  pantoprazole (PROTONIX) 40 MG tablet Take 1 tablet (40 mg total) by mouth daily. 09/30/20 09/30/21  Storm Frisk, MD  potassium chloride SA (KLOR-CON) 20 MEQ tablet Take 1 tablet (20 mEq total) by mouth 2 (two) times daily. 08/29/20 08/29/21  Storm Frisk, MD  sacubitril-valsartan (ENTRESTO) 97-103 MG TAKE 1 TABLET BY MOUTH 2 (TWO) TIMES DAILY. 06/04/20 06/04/21  Storm Frisk, MD  spironolactone (ALDACTONE) 25 MG  tablet Take 1 tablet (25 mg total) by mouth daily. 08/29/20 08/29/21  Storm Frisk, MD    Allergies    Patient has no known allergies.  Review of Systems   Review of Systems  Musculoskeletal:       Left shoulder pain  All other systems reviewed and are negative.   Physical Exam Updated Vital Signs BP (!) 136/93 (BP Location: Right Arm)   Pulse 73   Temp 98.6 F (37 C) (Oral)   Resp 18   Ht 5\' 6"  (1.676 m)   Wt 115.7 kg   SpO2 100%   BMI 41.17 kg/m   Physical Exam Vitals and nursing note reviewed.  HENT:     Head: Normocephalic and atraumatic.     Right Ear: External ear normal.     Left Ear: External ear normal.     Nose: Nose normal.     Mouth/Throat:     Mouth: Mucous  membranes are moist.     Pharynx: Oropharynx is clear.  Eyes:     Extraocular Movements: Extraocular movements intact.     Conjunctiva/sclera: Conjunctivae normal.     Pupils: Pupils are equal, round, and reactive to light.  Cardiovascular:     Rate and Rhythm: Normal rate and regular rhythm.     Pulses: Normal pulses.     Heart sounds: Normal heart sounds.  Pulmonary:     Effort: Pulmonary effort is normal.     Breath sounds: Normal breath sounds.  Abdominal:     General: Abdomen is flat. Bowel sounds are normal.     Palpations: Abdomen is soft.  Musculoskeletal:     Cervical back: Normal range of motion and neck supple.     Comments: Ac joint tenderness.  Decreased active rom due to pain.  Good passive rom.  Skin:    General: Skin is warm.     Capillary Refill: Capillary refill takes less than 2 seconds.  Neurological:     General: No focal deficit present.     Mental Status: He is alert and oriented to person, place, and time.  Psychiatric:        Mood and Affect: Mood normal.        Behavior: Behavior normal.     ED Results / Procedures / Treatments   Labs (all labs ordered are listed, but only abnormal results are displayed) Labs Reviewed - No data to display  EKG None  Radiology DG Shoulder Left  Result Date: 10/09/2020 CLINICAL DATA:  Left shoulder pain for 5 days, worse today EXAM: LEFT SHOULDER - 2+ VIEW COMPARISON:  08/24/2020 FINDINGS: Frontal, transscapular, and axillary views of the left shoulder are obtained. No fracture, subluxation, or dislocation. Joint spaces are well preserved. Minimal spurring undersurface acromion process. Left chest is clear. IMPRESSION: 1. Mild spurring of the acromion process. Otherwise unremarkable left shoulder. Electronically Signed   By: 08/26/2020 M.D.   On: 10/09/2020 20:43    Procedures Procedures   Medications Ordered in ED Medications  ketorolac (TORADOL) 30 MG/ML injection 30 mg (30 mg Intramuscular Given 10/09/20  2030)  dexamethasone (DECADRON) injection 10 mg (10 mg Intramuscular Given 10/09/20 2029)    ED Course  I have reviewed the triage vital signs and the nursing notes.  Pertinent labs & imaging results that were available during my care of the patient were reviewed by me and considered in my medical decision making (see chart for details).    MDM Rules/Calculators/A&P  Pt does have a spur to his acromion.  He is to f/u with ortho.  Return if worse.  Final Clinical Impression(s) / ED Diagnoses Final diagnoses:  Tendinitis of left shoulder    Rx / DC Orders ED Discharge Orders         Ordered    predniSONE (STERAPRED UNI-PAK 21 TAB) 10 MG (21) TBPK tablet        10/09/20 2055    ibuprofen (ADVIL) 600 MG tablet  Every 6 hours PRN        10/09/20 2055           Jacalyn Lefevre, MD 10/09/20 2232

## 2020-10-10 ENCOUNTER — Other Ambulatory Visit: Payer: Self-pay

## 2020-10-15 ENCOUNTER — Other Ambulatory Visit: Payer: Self-pay

## 2020-10-17 ENCOUNTER — Telehealth: Payer: Self-pay | Admitting: Physician Assistant

## 2020-10-17 ENCOUNTER — Other Ambulatory Visit: Payer: Self-pay

## 2020-10-17 DIAGNOSIS — R450 Nervousness: Secondary | ICD-10-CM

## 2020-10-17 NOTE — Patient Instructions (Addendum)
Derek Reed, thank you for joining Derek Climes, PA-C for today's virtual visit.  While this provider is not your primary care provider (PCP), if your PCP is located in our provider database this encounter information will be shared with them immediately following your visit.  Consent: (Patient) Derek Reed provided verbal consent for this virtual visit at the beginning of the encounter.  Current Medications:  Current Outpatient Medications:    carvedilol (COREG) 12.5 MG tablet, Take 1 tablet (12.5 mg total) by mouth 2 (two) times daily with a meal., Disp: 60 tablet, Rfl: 3   hydrOXYzine (VISTARIL) 25 MG capsule, Take 1 capsule (25 mg total) by mouth every 8 (eight) hours as needed for anxiety., Disp: 60 capsule, Rfl: 1   ibuprofen (ADVIL) 600 MG tablet, Take 1 tablet (600 mg total) by mouth every 6 (six) hours as needed., Disp: 30 tablet, Rfl: 0   isosorbide-hydrALAZINE (BIDIL) 20-37.5 MG tablet, TAKE 2 TABLETS BY MOUTH 3 (THREE) TIMES DAILY., Disp: 540 tablet, Rfl: 3   nitroGLYCERIN (NITROSTAT) 0.4 MG SL tablet, Place 1 tablet (0.4 mg total) under the tongue every 5 (five) minutes as needed for chest pain., Disp: 30 tablet, Rfl: 3   pantoprazole (PROTONIX) 40 MG tablet, Take 1 tablet (40 mg total) by mouth daily., Disp: 30 tablet, Rfl: 2   potassium chloride SA (KLOR-CON) 20 MEQ tablet, Take 1 tablet (20 mEq total) by mouth 2 (two) times daily., Disp: 60 tablet, Rfl: 3   predniSONE (DELTASONE) 10 MG tablet, Take 6 tabs for 2 days, then 5 for 2 days, then 4 for 2 days, then 3 for 2 days, 2 for 2 days, then 1 for 2 days, Disp: 42 tablet, Rfl: 0   sacubitril-valsartan (ENTRESTO) 97-103 MG, TAKE 1 TABLET BY MOUTH 2 (TWO) TIMES DAILY., Disp: 180 tablet, Rfl: 3   spironolactone (ALDACTONE) 25 MG tablet, Take 1 tablet (25 mg total) by mouth daily., Disp: 90 tablet, Rfl: 1   Medications ordered in this encounter:  No orders of the defined types were placed in this encounter.    *If you  need refills on other medications prior to your next appointment, please contact your pharmacy*  Follow-Up: Call back or seek an in-person evaluation if the symptoms worsen or if the condition fails to improve as anticipated.  Other Instructions Please try to work on activities that relax you before bed -- whether this is reading, light stretching, listening to music or spending time with loved ones.  I recommend you download the Calm or Headspace app on your phone to work on mindfulness training to help with stress/anxiety Limit any caffeine or alcohol until we get things evaluated.  As recommended you need to call Dr. Delford Field to set up a follow-up appointment to discuss this so they can get a further workup to rule out any other causes of current issues.    If you have been instructed to have an in-person evaluation today at a local Urgent Care facility, please use the link below. It will take you to a list of all of our available [facility], including address, phone number and hours of operation. Please do not delay care.  [facility]  If you or a family member do not have a primary care provider, use the link below to schedule a visit and establish care. When you choose a [facility] primary care physician or advanced practice provider, you gain a long-term partner in health. Find a Primary Care  Provider  Learn more about [facility]'s in-office and virtual care options: [facility]

## 2020-10-17 NOTE — Progress Notes (Signed)
Virtual Visit Consent   [name], you are scheduled for a virtual visit with a [facility] provider today.     Just as with appointments in the office, your consent must be obtained to participate.  Your consent will be active for this visit and any virtual visit you may have with one of our providers in the next 365 days.     If you have a MyChart account, a copy of this consent can be sent to you electronically.  All virtual visits are billed to your insurance company just like a traditional visit in the office.    As this is a virtual visit, video technology does not allow for your provider to perform a traditional examination.  This may limit your provider's ability to fully assess your condition.  If your provider identifies any concerns that need to be evaluated in person or the need to arrange testing (such as labs, EKG, etc.), we will make arrangements to do so.     Although advances in technology are sophisticated, we cannot ensure that it will always work on either your end or our end.  If the connection with a video visit is poor, the visit may have to be switched to a telephone visit.  With either a video or telephone visit, we are not always able to ensure that we have a secure connection.     I need to obtain your verbal consent now.   Are you willing to proceed with your visit today?    Raequon Knierim has provided verbal consent on 10/17/2020 for a virtual visit (video or telephone).   Derek Reed, [place]   Date: 10/17/2020 8:14 AM   Virtual Visit via Video Note   I, Derek Reed, connected [email or web address]@ ([id number], 19-Aug-1985) on 10/17/20 at  8:15 AM EDT by a video-enabled telemedicine application and verified that I am speaking with the correct person using two identifiers.  Location: Patient: Virtual Visit Location Patient: Home Provider: Virtual Visit Location Provider: Home Office   I discussed the limitations of evaluation and management by telemedicine and  the availability of in person appointments. The patient expressed understanding and agreed to proceed.    History of Present Illness: Derek Reed is a 35 y.o. who identifies as a male who was assigned male at birth, and is being seen today complaining of about 1 month of episodes at night where he lays down and he feels like after a while he has a rush of adrenaline and feels very jittery/shaky but is not actually have any tremors. Legs worse than arms. Usually lasting intermittently anywhere from 30 minutes to a few hours.  Denies lightheadedness, dizziness. Sometimes will feel his heart is racing for a short time. Denies chest pain with these episodes. Denies symptoms of RLS. Denies any recent change in chronic medications or diet. Is staying more active.  Some minor increase in stress levels up to when this started, noting a significant issue with anxiety. Has started discussing anxiety with PCP who gave him resources for counseling.   Makes him nervous so gets less sleep.   HPI: HPI  Problems:  Patient Active Problem List   Diagnosis Date Noted   HTN (hypertension) 02/24/2019   CKD (chronic kidney disease), stage II 02/24/2019   Chronic systolic heart failure (HCC)due to Hypertension    GAD (generalized anxiety disorder) 03/26/2014   BMI 36.0-36.9,adult 03/26/2014    Allergies: No Known Allergies Medications:  Current Outpatient Medications:    carvedilol (  COREG) 12.5 MG tablet, Take 1 tablet (12.5 mg total) by mouth 2 (two) times daily with a meal., Disp: 60 tablet, Rfl: 3   hydrOXYzine (VISTARIL) 25 MG capsule, Take 1 capsule (25 mg total) by mouth every 8 (eight) hours as needed for anxiety., Disp: 60 capsule, Rfl: 1   ibuprofen (ADVIL) 600 MG tablet, Take 1 tablet (600 mg total) by mouth every 6 (six) hours as needed., Disp: 30 tablet, Rfl: 0   isosorbide-hydrALAZINE (BIDIL) 20-37.5 MG tablet, TAKE 2 TABLETS BY MOUTH 3 (THREE) TIMES DAILY., Disp: 540 tablet, Rfl: 3   nitroGLYCERIN  (NITROSTAT) 0.4 MG SL tablet, Place 1 tablet (0.4 mg total) under the tongue every 5 (five) minutes as needed for chest pain., Disp: 30 tablet, Rfl: 3   pantoprazole (PROTONIX) 40 MG tablet, Take 1 tablet (40 mg total) by mouth daily., Disp: 30 tablet, Rfl: 2   potassium chloride SA (KLOR-CON) 20 MEQ tablet, Take 1 tablet (20 mEq total) by mouth 2 (two) times daily., Disp: 60 tablet, Rfl: 3   predniSONE (DELTASONE) 10 MG tablet, Take 6 tabs for 2 days, then 5 for 2 days, then 4 for 2 days, then 3 for 2 days, 2 for 2 days, then 1 for 2 days, Disp: 42 tablet, Rfl: 0   sacubitril-valsartan (ENTRESTO) 97-103 MG, TAKE 1 TABLET BY MOUTH 2 (TWO) TIMES DAILY., Disp: 180 tablet, Rfl: 3   spironolactone (ALDACTONE) 25 MG tablet, Take 1 tablet (25 mg total) by mouth daily., Disp: 90 tablet, Rfl: 1  Observations/Objective: Patient is well-developed, well-nourished in no acute distress.  Resting comfortably at home.  Head is normocephalic, atraumatic.  No labored breathing.  Speech is clear and coherent with logical content.  Patient is alert and oriented at baseline.   Assessment and Plan: 1. Jittery feeling Mainly occurring at nighttime when he is trying to rest. Starts feeling jittery without true spasm, tremor, etc. Sometimes associated with increased heart rate. No alarm signs/symptoms present. Has already been diagnosed with GAD by his PCP with encouragement to set up counseling appointment. I feel this is likely anxiety-related but recommended a follow-up with PCP for further evaluation to make sure there is nothing else contributing. Resources on mindfulness and deep breathing given. He agrees to reach out to his PCP. Will send this note to PCP upon completion.   Follow Up Instructions: I discussed the assessment and treatment plan with the patient. The patient was provided an opportunity to ask questions and all were answered. The patient agreed with the plan and demonstrated an understanding of the  instructions.  A copy of instructions were sent to the patient via MyChart.  Time:  I spent 15 minutes with the patient via telehealth technology discussing the above problems/concerns.    Derek Climes, PA-C

## 2020-10-29 ENCOUNTER — Other Ambulatory Visit: Payer: Self-pay

## 2020-10-30 ENCOUNTER — Other Ambulatory Visit: Payer: Self-pay

## 2020-11-13 ENCOUNTER — Other Ambulatory Visit: Payer: Self-pay

## 2020-11-14 ENCOUNTER — Other Ambulatory Visit: Payer: Self-pay

## 2020-11-15 ENCOUNTER — Other Ambulatory Visit: Payer: Self-pay

## 2020-11-27 ENCOUNTER — Other Ambulatory Visit: Payer: Self-pay

## 2020-12-11 ENCOUNTER — Other Ambulatory Visit: Payer: Self-pay

## 2020-12-12 ENCOUNTER — Other Ambulatory Visit: Payer: Self-pay

## 2020-12-13 ENCOUNTER — Other Ambulatory Visit: Payer: Self-pay

## 2020-12-20 ENCOUNTER — Encounter (HOSPITAL_BASED_OUTPATIENT_CLINIC_OR_DEPARTMENT_OTHER): Payer: Self-pay

## 2020-12-20 ENCOUNTER — Emergency Department (HOSPITAL_BASED_OUTPATIENT_CLINIC_OR_DEPARTMENT_OTHER)
Admission: EM | Admit: 2020-12-20 | Discharge: 2020-12-20 | Disposition: A | Discharge status: Home / Self Care | Payer: Self-pay | Attending: Emergency Medicine | Admitting: Emergency Medicine

## 2020-12-20 ENCOUNTER — Other Ambulatory Visit (HOSPITAL_BASED_OUTPATIENT_CLINIC_OR_DEPARTMENT_OTHER): Payer: Self-pay

## 2020-12-20 ENCOUNTER — Other Ambulatory Visit: Payer: Self-pay

## 2020-12-20 DIAGNOSIS — Z28310 Unvaccinated for covid-19: Secondary | ICD-10-CM | POA: Insufficient documentation

## 2020-12-20 DIAGNOSIS — Z79899 Other long term (current) drug therapy: Secondary | ICD-10-CM | POA: Insufficient documentation

## 2020-12-20 DIAGNOSIS — I130 Hypertensive heart and chronic kidney disease with heart failure and stage 1 through stage 4 chronic kidney disease, or unspecified chronic kidney disease: Secondary | ICD-10-CM | POA: Insufficient documentation

## 2020-12-20 DIAGNOSIS — Z955 Presence of coronary angioplasty implant and graft: Secondary | ICD-10-CM | POA: Insufficient documentation

## 2020-12-20 DIAGNOSIS — R000 Tachycardia, unspecified: Secondary | ICD-10-CM | POA: Insufficient documentation

## 2020-12-20 DIAGNOSIS — Z87891 Personal history of nicotine dependence: Secondary | ICD-10-CM | POA: Insufficient documentation

## 2020-12-20 DIAGNOSIS — N182 Chronic kidney disease, stage 2 (mild): Secondary | ICD-10-CM | POA: Insufficient documentation

## 2020-12-20 DIAGNOSIS — I5022 Chronic systolic (congestive) heart failure: Secondary | ICD-10-CM | POA: Insufficient documentation

## 2020-12-20 DIAGNOSIS — U071 COVID-19: Secondary | ICD-10-CM | POA: Diagnosis not present

## 2020-12-20 LAB — RESP PANEL BY RT-PCR (FLU A&B, COVID) ARPGX2
Influenza A by PCR: NEGATIVE
Influenza B by PCR: NEGATIVE
SARS Coronavirus 2 by RT PCR: POSITIVE — AB

## 2020-12-20 MED ORDER — ACETAMINOPHEN 500 MG PO TABS
1000.0000 mg | ORAL_TABLET | Freq: Once | ORAL | Status: AC
  Administered 2020-12-20: 1000 mg via ORAL
  Filled 2020-12-20: qty 2

## 2020-12-20 MED ORDER — NIRMATRELVIR/RITONAVIR (PAXLOVID)TABLET
3.0000 | ORAL_TABLET | Freq: Two times a day (BID) | ORAL | 0 refills | Status: DC
  Filled 2020-12-20: qty 30, 5d supply, fill #0

## 2020-12-20 NOTE — ED Notes (Signed)
Patient reports body aches, weakness, fatigue x 3 days.  Girlfriend is + for covid 19.  Patient has fever today, denies cough, shortness of breath.

## 2020-12-20 NOTE — ED Provider Notes (Signed)
[facility] EMERGENCY DEPARTMENT Provider Note   CSN: [id number] Arrival date & time: 12/20/20  1019     History Chief Complaint  Patient presents with   Weakness   Generalized Body Aches    Derek Reed is a 35 y.o. male.  The history is provided by the patient.  Fever Max temp prior to arrival:  103.3 Temp source:  Oral Severity:  Moderate Onset quality:  Gradual Duration:  2 days Timing:  Constant Progression:  Unchanged Chronicity:  New Relieved by:  Nothing Worsened by:  Nothing Ineffective treatments:  Acetaminophen Associated symptoms: chills and myalgias   Associated symptoms: no chest pain, no cough, no dysuria, no ear pain, no rash, no sore throat and no vomiting   Risk factors comment:  Unvaccinated for COVID, girlfriend positive for COVID     Past Medical History:  Diagnosis Date   Acute CHF (congestive heart failure) (HCC) 02/24/2019   Anxiety    Hypertension    Obesity    Peritonsillar abscess     Patient Active Problem List   Diagnosis Date Noted   HTN (hypertension) 02/24/2019   CKD (chronic kidney disease), stage II 02/24/2019   Chronic systolic heart failure (HCC)due to Hypertension    GAD (generalized anxiety disorder) 03/26/2014   BMI 36.0-36.9,adult 03/26/2014    Past Surgical History:  Procedure Laterality Date   INCISION AND DRAINAGE OF PERITONSILLAR ABCESS     RIGHT/LEFT HEART CATH AND CORONARY ANGIOGRAPHY N/A 02/24/2019   Procedure: RIGHT/LEFT HEART CATH AND CORONARY ANGIOGRAPHY;  Surgeon: Laurey Morale, MD;  Location: [facility] INVASIVE CV LAB;  Service: Cardiovascular;  Laterality: N/A;       Family History  Problem Relation Age of Onset   Hyperlipidemia Mother    Diabetes Mother    Stroke Mother    Hypertension Mother    Hypertension Father     Social History   Tobacco Use   Smoking status: Former   Smokeless tobacco: Never  Building services engineer Use: Never used  Substance Use Topics   Alcohol use: No    Drug use: No    Home Medications Prior to Admission medications   Medication Sig Start Date End Date Taking? Authorizing Provider  carvedilol (COREG) 12.5 MG tablet Take 1 tablet (12.5 mg total) by mouth 2 (two) times daily with a meal. 09/30/20 09/30/21  Storm Frisk, MD  hydrOXYzine (VISTARIL) 25 MG capsule Take 1 capsule (25 mg total) by mouth every 8 (eight) hours as needed for anxiety. 08/29/20   Storm Frisk, MD  ibuprofen (ADVIL) 600 MG tablet Take 1 tablet (600 mg total) by mouth every 6 (six) hours as needed. 10/09/20   Jacalyn Lefevre, MD  isosorbide-hydrALAZINE (BIDIL) 20-37.5 MG tablet TAKE 2 TABLETS BY MOUTH 3 (THREE) TIMES DAILY. 08/29/20 08/29/21  Storm Frisk, MD  nitroGLYCERIN (NITROSTAT) 0.4 MG SL tablet Place 1 tablet (0.4 mg total) under the tongue every 5 (five) minutes as needed for chest pain. 08/29/20   Storm Frisk, MD  pantoprazole (PROTONIX) 40 MG tablet Take 1 tablet (40 mg total) by mouth daily. 09/30/20 09/30/21  Storm Frisk, MD  potassium chloride SA (KLOR-CON) 20 MEQ tablet Take 1 tablet (20 mEq total) by mouth 2 (two) times daily. 08/29/20 08/29/21  Storm Frisk, MD  predniSONE (DELTASONE) 10 MG tablet Take 6 tabs for 2 days, then 5 for 2 days, then 4 for 2 days, then 3 for 2 days, 2 for 2 days,  then 1 for 2 days 10/09/20   Jacalyn Lefevre, MD  sacubitril-valsartan (ENTRESTO) 97-103 MG TAKE 1 TABLET BY MOUTH 2 (TWO) TIMES DAILY. 06/04/20 06/04/21  Storm Frisk, MD  spironolactone (ALDACTONE) 25 MG tablet Take 1 tablet (25 mg total) by mouth daily. 08/29/20 08/29/21  Storm Frisk, MD    Allergies    Patient has no known allergies.  Review of Systems   Review of Systems  Constitutional:  Positive for chills and fever.  HENT:  Negative for ear pain and sore throat.   Eyes:  Negative for pain and visual disturbance.  Respiratory:  Negative for cough and shortness of breath.   Cardiovascular:  Negative for chest pain and palpitations.   Gastrointestinal:  Negative for abdominal pain and vomiting.  Genitourinary:  Negative for dysuria and hematuria.  Musculoskeletal:  Positive for myalgias. Negative for arthralgias and back pain.  Skin:  Negative for color change and rash.  Neurological:  Negative for seizures and syncope.  All other systems reviewed and are negative.  Physical Exam Updated Vital Signs BP 132/70 (BP Location: Right Arm)   Pulse (!) 110   Temp (!) 103.3 F (39.6 C) (Oral)   Resp 18   Ht 5\' 6"  (1.676 m)   Wt 108.9 kg   SpO2 99%   BMI 38.74 kg/m   Physical Exam Vitals and nursing note reviewed.  Constitutional:      Appearance: Normal appearance.  HENT:     Head: Normocephalic and atraumatic.  Eyes:     Conjunctiva/sclera: Conjunctivae normal.  Pulmonary:     Effort: Pulmonary effort is normal. No respiratory distress.  Musculoskeletal:        General: No deformity. Normal range of motion.     Cervical back: Normal range of motion.  Skin:    General: Skin is warm and dry.  Neurological:     General: No focal deficit present.     Mental Status: He is alert and oriented to person, place, and time. Mental status is at baseline.  Psychiatric:        Mood and Affect: Mood normal.    ED Results / Procedures / Treatments   Labs (all labs ordered are listed, but only abnormal results are displayed) Labs Reviewed  RESP PANEL BY RT-PCR (FLU A&B, COVID) ARPGX2 - Abnormal; Notable for the following components:      Result Value   SARS Coronavirus 2 by RT PCR POSITIVE (*)    All other components within normal limits    EKG None  Radiology No results found.  Procedures Procedures   Medications Ordered in ED Medications  acetaminophen (TYLENOL) tablet 1,000 mg (1,000 mg Oral Given 12/20/20 1112)    ED Course  I have reviewed the triage vital signs and the nursing notes.  Pertinent labs & imaging results that were available during my care of the patient were reviewed by me and  considered in my medical decision making (see chart for details).    MDM Rules/Calculators/A&P                           Tristian Hudnall resents with COVID symptoms and a positive contact.  He did test positive for COVID-19.  He was quite febrile and tachycardic and was given acetaminophen.  He will be treated with Paxlovid secondary to his risk factors of hypertension, CHF, and elevated BMI.  He was given instructions on this medication as well as symptomatic  management and isolation instructions. Final Clinical Impression(s) / ED Diagnoses Final diagnoses:  COVID-19    Rx / DC Orders ED Discharge Orders          Ordered    nirmatrelvir/ritonavir EUA (PAXLOVID) TABS  2 times daily        12/20/20 1230             Koleen Distance, MD 12/20/20 1232

## 2020-12-20 NOTE — ED Triage Notes (Signed)
Weakness, body aches x 3 days.  Patient unaware of fever until this morning.  Girlfriend + for covid.  Denies cough, sob.

## 2020-12-22 ENCOUNTER — Encounter (HOSPITAL_BASED_OUTPATIENT_CLINIC_OR_DEPARTMENT_OTHER): Payer: Self-pay

## 2020-12-22 ENCOUNTER — Emergency Department (HOSPITAL_BASED_OUTPATIENT_CLINIC_OR_DEPARTMENT_OTHER)
Admission: EM | Admit: 2020-12-22 | Discharge: 2020-12-22 | Disposition: A | Discharge status: Home / Self Care | Payer: Self-pay | Attending: Emergency Medicine | Admitting: Emergency Medicine

## 2020-12-22 ENCOUNTER — Other Ambulatory Visit: Payer: Self-pay

## 2020-12-22 DIAGNOSIS — R197 Diarrhea, unspecified: Secondary | ICD-10-CM

## 2020-12-22 DIAGNOSIS — I5022 Chronic systolic (congestive) heart failure: Secondary | ICD-10-CM | POA: Insufficient documentation

## 2020-12-22 DIAGNOSIS — I130 Hypertensive heart and chronic kidney disease with heart failure and stage 1 through stage 4 chronic kidney disease, or unspecified chronic kidney disease: Secondary | ICD-10-CM | POA: Insufficient documentation

## 2020-12-22 DIAGNOSIS — E860 Dehydration: Secondary | ICD-10-CM | POA: Diagnosis not present

## 2020-12-22 DIAGNOSIS — Z87891 Personal history of nicotine dependence: Secondary | ICD-10-CM | POA: Insufficient documentation

## 2020-12-22 DIAGNOSIS — U071 COVID-19: Secondary | ICD-10-CM | POA: Insufficient documentation

## 2020-12-22 DIAGNOSIS — Z79899 Other long term (current) drug therapy: Secondary | ICD-10-CM | POA: Insufficient documentation

## 2020-12-22 DIAGNOSIS — N182 Chronic kidney disease, stage 2 (mild): Secondary | ICD-10-CM | POA: Insufficient documentation

## 2020-12-22 LAB — COMPREHENSIVE METABOLIC PANEL
ALT: 23 U/L (ref 0–44)
AST: 25 U/L (ref 15–41)
Albumin: 3.8 g/dL (ref 3.5–5.0)
Alkaline Phosphatase: 67 U/L (ref 38–126)
Anion gap: 10 (ref 5–15)
BUN: 18 mg/dL (ref 6–20)
CO2: 24 mmol/L (ref 22–32)
Calcium: 8.8 mg/dL — ABNORMAL LOW (ref 8.9–10.3)
Chloride: 98 mmol/L (ref 98–111)
Creatinine, Ser: 1.94 mg/dL — ABNORMAL HIGH (ref 0.61–1.24)
GFR, Estimated: 45 mL/min — ABNORMAL LOW (ref >60)
Glucose, Bld: 138 mg/dL — ABNORMAL HIGH (ref 70–99)
Potassium: 4 mmol/L (ref 3.5–5.1)
Sodium: 132 mmol/L — ABNORMAL LOW (ref 135–145)
Total Bilirubin: 0.3 mg/dL (ref 0.3–1.2)
Total Protein: 7.8 g/dL (ref 6.5–8.1)

## 2020-12-22 LAB — CBC WITH DIFFERENTIAL/PLATELET
Abs Immature Granulocytes: 0.01 10*3/uL (ref 0.00–0.07)
Basophils Absolute: 0 10*3/uL (ref 0.0–0.1)
Basophils Relative: 0 %
Eosinophils Absolute: 0 10*3/uL (ref 0.0–0.5)
Eosinophils Relative: 0 %
HCT: 41.1 % (ref 39.0–52.0)
Hemoglobin: 13.2 g/dL (ref 13.0–17.0)
Immature Granulocytes: 0 %
Lymphocytes Relative: 72 %
Lymphs Abs: 3.4 10*3/uL (ref 0.7–4.0)
MCH: 23.2 pg — ABNORMAL LOW (ref 26.0–34.0)
MCHC: 32.1 g/dL (ref 30.0–36.0)
MCV: 72.2 fL — ABNORMAL LOW (ref 80.0–100.0)
Monocytes Absolute: 0.4 10*3/uL (ref 0.1–1.0)
Monocytes Relative: 9 %
Neutro Abs: 0.9 10*3/uL — ABNORMAL LOW (ref 1.7–7.7)
Neutrophils Relative %: 19 %
Platelets: 275 10*3/uL (ref 150–400)
RBC: 5.69 MIL/uL (ref 4.22–5.81)
RDW: 15.4 % (ref 11.5–15.5)
Smear Review: NORMAL
WBC Morphology: >10
WBC: 4.7 10*3/uL (ref 4.0–10.5)
nRBC: 0 % (ref 0.0–0.2)

## 2020-12-22 MED ORDER — SODIUM CHLORIDE 0.9 % IV BOLUS
250.0000 mL | Freq: Once | INTRAVENOUS | Status: AC
  Administered 2020-12-22: 250 mL via INTRAVENOUS

## 2020-12-22 MED ORDER — LOPERAMIDE HCL 2 MG PO CAPS: 2.0000 mg | ORAL_CAPSULE | Freq: Four times a day (QID) | ORAL | 0 refills | Status: DC | PRN

## 2020-12-22 NOTE — ED Triage Notes (Signed)
Pt arrives ambulatory to ED with c/o fatigue with diarrhea reports 8 episodes of diarrhea in the last 24 hours.

## 2020-12-22 NOTE — ED Provider Notes (Signed)
[facility] EMERGENCY DEPARTMENT Provider Note   CSN: [id number] Arrival date & time: 12/22/20  0955     History Chief Complaint  Patient presents with   Diarrhea   Covid Positive    Derek Reed is a 35 y.o. male presenting for evaluation of diarrhea and generalized weakness in the setting of COVID infection.  Patient states he was diagnosed with COVID 2 days ago.  He reports he has had a lot of diarrhea since then, and is just feeling very exhausted going back and forth from the bathroom.  He reports his stomach feels "sick."  He is able to tolerate p.o. without difficulty.  He reports fevers and body aches have resolved.  He is not having any chest pain, shortness of breath, significant cough.  No blood in his stool.  He has not taken anything for his symptoms.  Additional history taken chart reviewed.  Patient with a history of anxiety, hypertension, obesity, CKD, CHF (last EF 55-60% in 2021).   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   Chronic systolic heart failure (HCC)due to Hypertension    GAD (generalized anxiety disorder) 03/26/2014   BMI 36.0-36.9,adult 03/26/2014    Past Surgical History:  Procedure Laterality Date   INCISION AND DRAINAGE OF PERITONSILLAR ABCESS     RIGHT/LEFT HEART CATH AND CORONARY ANGIOGRAPHY N/A 02/24/2019   Procedure: RIGHT/LEFT HEART CATH AND CORONARY ANGIOGRAPHY;  Surgeon: Laurey Morale, MD;  Location: [facility] INVASIVE CV LAB;  Service: Cardiovascular;  Laterality: N/A;       Family History  Problem Relation Age of Onset   Hyperlipidemia Mother    Diabetes Mother    Stroke Mother    Hypertension Mother    Hypertension Father     Social History    Tobacco Use   Smoking status: Former   Smokeless tobacco: Never  Building services engineer Use: Never used  Substance Use Topics   Alcohol use: No   Drug use: No    Home Medications Prior to Admission medications   Medication Sig Start Date End Date Taking? Authorizing Provider  loperamide (IMODIUM) 2 MG capsule Take 1 capsule (2 mg total) by mouth 4 (four) times daily as needed for diarrhea or loose stools. 12/22/20  Yes Laelah Siravo, PA-C  carvedilol (COREG) 12.5 MG tablet Take 1 tablet (12.5 mg total) by mouth 2 (two) times daily with a meal. 09/30/20 09/30/21  Storm Frisk, MD  hydrOXYzine (VISTARIL) 25 MG capsule Take 1 capsule (25 mg total) by mouth every 8 (eight) hours as needed for anxiety. 08/29/20   Storm Frisk, MD  ibuprofen (ADVIL) 600 MG tablet Take 1 tablet (600 mg total) by mouth every 6 (six) hours as needed. 10/09/20   Jacalyn Lefevre, MD  isosorbide-hydrALAZINE (BIDIL) 20-37.5 MG tablet TAKE 2 TABLETS BY MOUTH 3 (THREE) TIMES DAILY. 08/29/20 08/29/21  Storm Frisk, MD  nirmatrelvir/ritonavir EUA (PAXLOVID) TABS Take 3 tablets by mouth 2 (two) times daily for 5 days. Patient GFR is 56. Take nirmatrelvir (150 mg) two tablets twice daily for 5 days and ritonavir (100 mg) one tablet twice daily for 5 days. 12/20/20 12/25/20  Delford Field,  Ann Maki, MD  nitroGLYCERIN (NITROSTAT) 0.4 MG SL tablet Place 1 tablet (0.4 mg total) under the tongue every 5 (five) minutes as needed for chest pain. 08/29/20   Storm Frisk, MD  pantoprazole (PROTONIX) 40 MG tablet Take 1 tablet (40 mg total) by mouth daily. 09/30/20 09/30/21  Storm Frisk, MD  potassium chloride SA (KLOR-CON) 20 MEQ tablet Take 1 tablet (20 mEq total) by mouth 2 (two) times daily. 08/29/20 08/29/21  Storm Frisk, MD  predniSONE (DELTASONE) 10 MG tablet Take 6 tabs for 2 days, then 5 for 2 days, then 4 for 2 days, then 3 for 2 days, 2 for 2 days, then 1 for 2 days 10/09/20   Jacalyn Lefevre, MD   sacubitril-valsartan (ENTRESTO) 97-103 MG TAKE 1 TABLET BY MOUTH 2 (TWO) TIMES DAILY. 06/04/20 06/04/21  Storm Frisk, MD  spironolactone (ALDACTONE) 25 MG tablet Take 1 tablet (25 mg total) by mouth daily. 08/29/20 08/29/21  Storm Frisk, MD    Allergies    Patient has no known allergies.  Review of Systems   Review of Systems  Constitutional:  Positive for fever (resolved).  Gastrointestinal:  Positive for diarrhea.  Neurological:  Positive for weakness.  All other systems reviewed and are negative.  Physical Exam Updated Vital Signs BP 103/73 (BP Location: Right Arm)   Pulse 70   Temp 98.4 F (36.9 C) (Oral)   Resp 18   Ht 5\' 6"  (1.676 m)   Wt 108.9 kg   SpO2 99%   BMI 38.74 kg/m   Physical Exam Vitals and nursing note reviewed.  Constitutional:      General: He is not in acute distress.    Appearance: Normal appearance.     Comments: Resting in the bed in no acute distress  HENT:     Head: Normocephalic and atraumatic.  Eyes:     Conjunctiva/sclera: Conjunctivae normal.     Pupils: Pupils are equal, round, and reactive to light.  Cardiovascular:     Rate and Rhythm: Normal rate and regular rhythm.     Pulses: Normal pulses.  Pulmonary:     Effort: Pulmonary effort is normal. No respiratory distress.     Breath sounds: Normal breath sounds. No wheezing.     Comments: Speaking in full sentences.  Clear lung sounds in all fields.  Sats 98% on room air Abdominal:     General: There is no distension.     Palpations: Abdomen is soft. There is no mass.     Tenderness: There is no abdominal tenderness. There is no guarding or rebound.     Comments: No tenderness palpation the abdomen.  No rigidity, guarding, distention.  Negative rebound.  No peritonitis.  Musculoskeletal:        General: Normal range of motion.     Cervical back: Normal range of motion and neck supple.  Skin:    General: Skin is warm and dry.     Capillary Refill: Capillary refill takes  less than 2 seconds.  Neurological:     Mental Status: He is alert and oriented to person, place, and time.  Psychiatric:        Mood and Affect: Mood and affect normal.        Speech: Speech normal.        Behavior: Behavior normal.    ED Results / Procedures / Treatments   Labs (all labs ordered are listed, but only abnormal results are displayed) Labs Reviewed  CBC WITH DIFFERENTIAL/PLATELET - Abnormal; Notable for the following components:      Result Value   MCV 72.2 (*)    MCH 23.2 (*)    All other components within normal limits  COMPREHENSIVE METABOLIC PANEL - Abnormal; Notable for the following components:   Sodium 132 (*)    Glucose, Bld 138 (*)    Creatinine, Ser 1.94 (*)    Calcium 8.8 (*)    GFR, Estimated 45 (*)    All other components within normal limits    EKG None  Radiology No results found.  Procedures Procedures   Medications Ordered in ED Medications  sodium chloride 0.9 % bolus 250 mL (250 mLs Intravenous New Bag/Given 12/22/20 1124)    ED Course  I have reviewed the triage vital signs and the nursing notes.  Pertinent labs & imaging results that were available during my care of the patient were reviewed by me and considered in my medical decision making (see chart for details).    MDM Rules/Calculators/A&P                           Patient presenting for evaluation of diarrhea in the setting of a COVID infection.  On exam, patient appears nontoxic.  He has no abdominal pain and abdominal exam is benign.  He has no respiratory symptoms and sats are reassuring.  Will check basic labs to ensure no significant AKI or electrolyte abnormality.  Will check orthostatics to ensure no significant change.  We will give a very small, 250 cc, bolus of fluids to help with symptom management, however will be cautious in the setting of heart failure.  Labs interpreted by me, shows slight increase in creatinine, however not significantly different from  baseline.  Orthostatics show mild elevation of heart rate and drop of blood pressure when standing, but not a significant change.  Both of these will be treated with a little bit of fluid and encouragement of p.o. fluid intake.  Labs otherwise reassuring.  On reevaluation, patient's blood pressure is better at 130/73.  He states he is feeling better.  I discussed importance of hydration.  In the setting of diarrhea causing dehydration, will give Imodium.  Discussed continued symptomatic treatment, follow-up with PCP as needed.  At this time, patient appears safe for d/c. Return precautions given.  Patient states he understands and agrees to plan.   Final Clinical Impression(s) / ED Diagnoses Final diagnoses:  Diarrhea, unspecified type  Dehydration  COVID-19    Rx / DC Orders ED Discharge Orders          Ordered    loperamide (IMODIUM) 2 MG capsule  4 times daily PRN        12/22/20 1224             [place], Lashanti Chambless, PA-C 12/22/20 1245    Koleen Distance, MD 12/22/20 1254

## 2020-12-22 NOTE — Discharge Instructions (Addendum)
You were little bit dehydrated today.  Make sure you are staying well-hydrated water.  Your urine should be clear to pale yellow. Use Imodium as needed to decrease bowel movements. Continue taking paxlovid as prescribed.  Return to the ER with any new, worsening, or concerning symptoms.

## 2020-12-22 NOTE — ED Notes (Signed)
Pt discharged to home. Discharge instructions have been discussed with patient and/or family members. Pt verbally acknowledges understanding d/c instructions, and endorses comprehension to checkout at registration before leaving.  °

## 2020-12-23 ENCOUNTER — Telehealth: Payer: Self-pay | Admitting: Critical Care Medicine

## 2020-12-23 ENCOUNTER — Encounter: Payer: Self-pay | Admitting: Critical Care Medicine

## 2020-12-23 NOTE — Telephone Encounter (Signed)
I connected with the patient by phone he was diagnosed with COVID on August 12 was given Paxil over the.  At that point his creatinine clearance was 58  The patient developed diarrhea over the weekend came back to the emergency room on the 14th.  At that point he was given IV fluids and Imodium and no changes made in medications.  Note he is off the pantoprazole at this time.  He has been taking all of his other medications but important to note that his creatinine clearance had dropped to less than 30 and he was still taking the pack Slo-Bid.  He was having complaints of jitteriness and not able to sleep.  The diarrhea has checked up on the Imodium that he was given.  My impression is that most of the side effects are due to the antiviral pack Slo-Bid and his creatinine clearance is now such that he can no longer take the full dose of Paxil over the  He has had 2-1/2 days of therapy and at this point I am recommending he stop the pack Slo-Bid and continue to hydrate himself take Imodium as needed and I will check back in with him later in the week.  No other medication changes were made

## 2020-12-24 LAB — PATHOLOGIST SMEAR REVIEW

## 2020-12-30 ENCOUNTER — Other Ambulatory Visit: Payer: Self-pay

## 2021-01-01 ENCOUNTER — Ambulatory Visit: Payer: Self-pay | Attending: Critical Care Medicine | Admitting: Critical Care Medicine

## 2021-01-01 ENCOUNTER — Other Ambulatory Visit: Payer: Self-pay

## 2021-01-01 ENCOUNTER — Encounter: Payer: Self-pay | Admitting: Critical Care Medicine

## 2021-01-01 VITALS — BP 105/67 | HR 106 | Ht 66.0 in | Wt 243.4 lb

## 2021-01-01 DIAGNOSIS — I10 Essential (primary) hypertension: Secondary | ICD-10-CM

## 2021-01-01 DIAGNOSIS — N182 Chronic kidney disease, stage 2 (mild): Secondary | ICD-10-CM

## 2021-01-01 DIAGNOSIS — I5022 Chronic systolic (congestive) heart failure: Secondary | ICD-10-CM

## 2021-01-01 DIAGNOSIS — Z23 Encounter for immunization: Secondary | ICD-10-CM

## 2021-01-01 MED ORDER — ISOSORB DINITRATE-HYDRALAZINE 20-37.5 MG PO TABS
ORAL_TABLET | ORAL | 3 refills | Status: DC
  Filled 2021-01-01: qty 180, 30d supply, fill #0
  Filled 2021-01-24: qty 540, 90d supply, fill #0

## 2021-01-01 MED ORDER — CARVEDILOL 12.5 MG PO TABS
12.5000 mg | ORAL_TABLET | Freq: Two times a day (BID) | ORAL | 3 refills | Status: DC
  Filled 2021-01-01: qty 60, 30d supply, fill #0
  Filled 2021-01-30: qty 180, 90d supply, fill #0

## 2021-01-01 MED ORDER — SACUBITRIL-VALSARTAN 97-103 MG PO TABS
1.0000 | ORAL_TABLET | Freq: Two times a day (BID) | ORAL | 3 refills | Status: DC
  Filled 2021-01-01: qty 60, 30d supply, fill #0

## 2021-01-01 MED ORDER — POTASSIUM CHLORIDE CRYS ER 20 MEQ PO TBCR
20.0000 meq | EXTENDED_RELEASE_TABLET | Freq: Two times a day (BID) | ORAL | 3 refills | Status: DC
  Filled 2021-01-01 – 2021-01-14 (×2): qty 60, 30d supply, fill #0
  Filled 2021-02-13: qty 60, 30d supply, fill #1
  Filled 2021-03-14: qty 60, 30d supply, fill #2

## 2021-01-01 MED ORDER — SPIRONOLACTONE 25 MG PO TABS
25.0000 mg | ORAL_TABLET | Freq: Every day | ORAL | 1 refills | Status: DC
  Filled 2021-01-01 – 2021-01-14 (×2): qty 90, 90d supply, fill #0

## 2021-01-01 NOTE — Assessment & Plan Note (Signed)
Check renal function. 

## 2021-01-01 NOTE — Patient Instructions (Signed)
Feels a nausea medication sent to our pharmacy  Referral to heart failure clinic was made  Flu vaccine given  Metabolic panel obtained today at the lab  No change in medications  Return in follow-up 4 months

## 2021-01-01 NOTE — Assessment & Plan Note (Signed)
Blood pressure under good control no change in medications check electrolytes and renal function today

## 2021-01-01 NOTE — Addendum Note (Signed)
Addended by: Ronette Deter on: 01/01/2021 09:31 AM   Modules accepted: Orders

## 2021-01-01 NOTE — Assessment & Plan Note (Signed)
No change in heart failure medications he is compensated this time we will get him back into the heart failure clinic

## 2021-01-01 NOTE — Progress Notes (Signed)
Established Patient Office Visit  Subjective:  Patient ID: Derek Reed, male    DOB: 05-25-1985  Age: 35 y.o. MRN: [id number]  CC:  Chief Complaint  Patient presents with   Hypertension    HPI Derek Reed presents for hypertension and heart failure follow-up.  Note he is due a flu shot and will take this today.  He is completely recovered from the COVID illness.  He is no longer having any symptoms of gastrointestinal upset.  He does need a referral back to the heart failure clinic.  He has been taking all of his heart failure medications and doing well with this.  When he was in the emergency room for his COVID illness several weeks ago his creatinine had increased slightly potassium was normal.  He will need a recheck on his renal function at this visit.  He has no other complaints at this time.    Past Medical History:  Diagnosis Date   Acute CHF (congestive heart failure) ([place]) 02/24/2019   Anxiety    Hypertension    Obesity    Peritonsillar abscess     Past Surgical History:  Procedure Laterality Date   INCISION AND DRAINAGE OF PERITONSILLAR ABCESS     RIGHT/LEFT HEART CATH AND CORONARY ANGIOGRAPHY N/A 02/24/2019   Procedure: RIGHT/LEFT HEART CATH AND CORONARY ANGIOGRAPHY;  Surgeon: Larey Dresser, MD;  Location: [facility];  Service: Cardiovascular;  Laterality: N/A;    Family History  Problem Relation Age of Onset   Hyperlipidemia Mother    Diabetes Mother    Stroke Mother    Hypertension Mother    Hypertension Father     Social History   Socioeconomic History   Marital status: Single    Spouse name: Not on file   Number of children: Not on file   Years of education: Not on file   Highest education level: Not on file  Occupational History   Not on file  Tobacco Use   Smoking status: Former   Smokeless tobacco: Never  Vaping Use   Vaping Use: Never used  Substance and Sexual Activity   Alcohol use: No   Drug use: No   Sexual activity:  Not on file  Other Topics Concern   Not on file  Social History Narrative   Not on file   Social Determinants of Health   Financial Resource Strain: Not on file  Food Insecurity: Not on file  Transportation Needs: Not on file  Physical Activity: Not on file  Stress: Not on file  Social Connections: Not on file  Intimate Partner Violence: Not on file    Outpatient Medications Prior to Visit  Medication Sig Dispense Refill   nitroGLYCERIN (NITROSTAT) 0.4 MG SL tablet Place 1 tablet (0.4 mg total) under the tongue every 5 (five) minutes as needed for chest pain. 30 tablet 3   carvedilol (COREG) 12.5 MG tablet Take 1 tablet (12.5 mg total) by mouth 2 (two) times daily with a meal. 60 tablet 3   ibuprofen (ADVIL) 600 MG tablet Take 1 tablet (600 mg total) by mouth every 6 (six) hours as needed. 30 tablet 0   isosorbide-hydrALAZINE (BIDIL) 20-37.5 MG tablet TAKE 2 TABLETS BY MOUTH 3 (THREE) TIMES DAILY. 540 tablet 3   loperamide (IMODIUM) 2 MG capsule Take 1 capsule (2 mg total) by mouth 4 (four) times daily as needed for diarrhea or loose stools. 12 capsule 0   potassium chloride SA (KLOR-CON) 20 MEQ tablet Take 1  tablet (20 mEq total) by mouth 2 (two) times daily. 60 tablet 3   sacubitril-valsartan (ENTRESTO) 97-103 MG TAKE 1 TABLET BY MOUTH 2 (TWO) TIMES DAILY. 180 tablet 3   spironolactone (ALDACTONE) 25 MG tablet Take 1 tablet (25 mg total) by mouth daily. 90 tablet 1   hydrOXYzine (VISTARIL) 25 MG capsule Take 1 capsule (25 mg total) by mouth every 8 (eight) hours as needed for anxiety. (Patient not taking: Reported on 01/01/2021) 60 capsule 1   No facility-administered medications prior to visit.    No Known Allergies  ROS Review of Systems  HENT: Negative.    Respiratory: Negative.    Cardiovascular: Negative.   Gastrointestinal: Negative.   Genitourinary: Negative.   Musculoskeletal: Negative.   Neurological: Negative.   Psychiatric/Behavioral: Negative.        Objective:    Physical Exam Constitutional:      Appearance: Normal appearance. He is normal weight.  HENT:     Head: Normocephalic and atraumatic.     Nose: Nose normal.     Mouth/Throat:     Mouth: Mucous membranes are moist.  Eyes:     Extraocular Movements: Extraocular movements intact.     Pupils: Pupils are equal, round, and reactive to light.  Cardiovascular:     Rate and Rhythm: Normal rate and regular rhythm.     Pulses: Normal pulses.     Heart sounds: Normal heart sounds.  Pulmonary:     Effort: Pulmonary effort is normal.     Breath sounds: Normal breath sounds.  Abdominal:     General: Abdomen is flat. Bowel sounds are normal.  Musculoskeletal:        General: Normal range of motion.     Cervical back: Normal range of motion.  Skin:    General: Skin is warm and dry.  Neurological:     General: No focal deficit present.     Mental Status: He is alert.  Psychiatric:        Mood and Affect: Mood normal.        Behavior: Behavior normal.    BP 105/67 (BP Location: Right Arm, Patient Position: Sitting)   Pulse (!) 106   Ht _0  (1.676 m)   Wt 243 lb 6.4 oz (110.4 kg)   SpO2 98%   BMI 39.29 kg/m  Wt Readings from Last 3 Encounters:  01/01/21 243 lb 6.4 oz (110.4 kg)  12/22/20 240 lb (108.9 kg)  12/20/20 240 lb (108.9 kg)     Health Maintenance Due  Topic Date Due   INFLUENZA VACCINE  12/09/2020    There are no preventive care reminders to display for this patient.  No results found for: TSH Lab Results  Component Value Date   WBC 4.7 12/22/2020   HGB 13.2 12/22/2020   HCT 41.1 12/22/2020   MCV 72.2 (L) 12/22/2020   PLT 275 12/22/2020   Lab Results  Component Value Date   NA 132 (L) 12/22/2020   K 4.0 12/22/2020   CO2 24 12/22/2020   GLUCOSE 138 (H) 12/22/2020   BUN 18 12/22/2020   CREATININE 1.94 (H) 12/22/2020   BILITOT 0.3 12/22/2020   ALKPHOS 67 12/22/2020   AST 25 12/22/2020   ALT 23 12/22/2020   PROT 7.8 12/22/2020   ALBUMIN  3.8 12/22/2020   CALCIUM 8.8 (L) 12/22/2020   ANIONGAP 10 12/22/2020   EGFR 56 (L) 09/30/2020   Lab Results  Component Value Date   CHOL 183 02/24/2019  Lab Results  Component Value Date   HDL 31 (L) 02/24/2019   Lab Results  Component Value Date   LDLCALC 139 (H) 02/24/2019   Lab Results  Component Value Date   TRIG 65 02/24/2019   Lab Results  Component Value Date   CHOLHDL 5.9 02/24/2019   Lab Results  Component Value Date   HGBA1C 6.2 (H) 02/24/2019      Assessment & Plan:   Problem List Items Addressed This Visit       Cardiovascular and Mediastinum   HTN (hypertension)    Blood pressure under good control no change in medications check electrolytes and renal function today      Relevant Medications   carvedilol (COREG) 12.5 MG tablet   isosorbide-hydrALAZINE (BIDIL) 20-37.5 MG tablet   sacubitril-valsartan (ENTRESTO) 97-103 MG   spironolactone (ALDACTONE) 25 MG tablet   Other Relevant Orders   Basic Metabolic Panel   Chronic systolic heart failure (HCC)due to Hypertension - Primary    No change in heart failure medications he is compensated this time we will get him back into the heart failure clinic      Relevant Medications   carvedilol (COREG) 12.5 MG tablet   isosorbide-hydrALAZINE (BIDIL) 20-37.5 MG tablet   sacubitril-valsartan (ENTRESTO) 97-103 MG   spironolactone (ALDACTONE) 25 MG tablet   Other Relevant Orders   Basic Metabolic Panel   AMB referral to CHF clinic     Genitourinary   CKD (chronic kidney disease), stage II    Check renal function       Meds ordered this encounter  Medications   carvedilol (COREG) 12.5 MG tablet    Sig: Take 1 tablet (12.5 mg total) by mouth 2 (two) times daily with a meal.    Dispense:  60 tablet    Refill:  3   isosorbide-hydrALAZINE (BIDIL) 20-37.5 MG tablet    Sig: TAKE 2 TABLETS BY MOUTH 3 (THREE) TIMES DAILY.    Dispense:  540 tablet    Refill:  3   potassium chloride SA (KLOR-CON) 20 MEQ  tablet    Sig: Take 1 tablet (20 mEq total) by mouth 2 (two) times daily.    Dispense:  60 tablet    Refill:  3   sacubitril-valsartan (ENTRESTO) 97-103 MG    Sig: TAKE 1 TABLET BY MOUTH 2 (TWO) TIMES DAILY.    Dispense:  180 tablet    Refill:  3   spironolactone (ALDACTONE) 25 MG tablet    Sig: Take 1 tablet (25 mg total) by mouth daily.    Dispense:  90 tablet    Refill:  1    Follow-up: Return in about 4 months (around 05/03/2021).    Asencion Noble, MD

## 2021-01-02 LAB — BASIC METABOLIC PANEL
BUN/Creatinine Ratio: 6 — ABNORMAL LOW (ref 9–20)
BUN: 10 mg/dL (ref 6–20)
CO2: 20 mmol/L (ref 20–29)
Calcium: 9.1 mg/dL (ref 8.7–10.2)
Chloride: 106 mmol/L (ref 96–106)
Creatinine, Ser: 1.66 mg/dL — ABNORMAL HIGH (ref 0.76–1.27)
Glucose: 108 mg/dL — ABNORMAL HIGH (ref 65–99)
Potassium: 4.8 mmol/L (ref 3.5–5.2)
Sodium: 138 mmol/L (ref 134–144)
eGFR: 54 mL/min/{1.73_m2} — ABNORMAL LOW (ref >59)

## 2021-01-08 ENCOUNTER — Other Ambulatory Visit: Payer: Self-pay

## 2021-01-14 ENCOUNTER — Other Ambulatory Visit: Payer: Self-pay

## 2021-01-24 ENCOUNTER — Other Ambulatory Visit: Payer: Self-pay

## 2021-01-27 ENCOUNTER — Other Ambulatory Visit: Payer: Self-pay

## 2021-01-30 ENCOUNTER — Other Ambulatory Visit: Payer: Self-pay

## 2021-01-31 ENCOUNTER — Other Ambulatory Visit: Payer: Self-pay

## 2021-02-13 ENCOUNTER — Other Ambulatory Visit: Payer: Self-pay

## 2021-02-13 ENCOUNTER — Other Ambulatory Visit (HOSPITAL_COMMUNITY): Payer: Self-pay

## 2021-02-13 MED ORDER — SACUBITRIL-VALSARTAN 97-103 MG PO TABS: 1.0000 | ORAL_TABLET | Freq: Two times a day (BID) | ORAL | 1 refills | Status: DC

## 2021-02-14 ENCOUNTER — Other Ambulatory Visit: Payer: Self-pay

## 2021-03-03 ENCOUNTER — Other Ambulatory Visit: Payer: Self-pay

## 2021-03-03 ENCOUNTER — Other Ambulatory Visit (HOSPITAL_COMMUNITY): Payer: Self-pay

## 2021-03-03 ENCOUNTER — Telehealth (HOSPITAL_COMMUNITY): Payer: Self-pay | Admitting: Pharmacy Technician

## 2021-03-03 MED ORDER — SACUBITRIL-VALSARTAN 97-103 MG PO TABS
1.0000 | ORAL_TABLET | Freq: Two times a day (BID) | ORAL | 3 refills | Status: DC
  Filled 2021-03-03 – 2021-07-04 (×3): qty 60, 30d supply, fill #0
  Filled 2021-08-11: qty 60, 30d supply, fill #1
  Filled 2021-09-15: qty 60, 30d supply, fill #2
  Filled 2021-10-16: qty 60, 30d supply, fill #3

## 2021-03-03 NOTE — Telephone Encounter (Signed)
Advanced Heart Failure Patient Advocate Encounter  Received a patient assistance renewal application for Entresto from [employer]. The patient currently has Nurse, learning disability. The 90 co-pay is, $90. The patient has a $10 co-pay card on file. Sent request to [employer]) to send to [employer] and wellness after speaking with the patient.   He is aware that we will not seek renewal at this time.  Archer Asa, CPhT

## 2021-03-03 NOTE — Addendum Note (Signed)
Addended by: Noralee Space on: 03/03/2021 04:00 PM   Modules accepted: Orders

## 2021-03-04 ENCOUNTER — Other Ambulatory Visit: Payer: Self-pay

## 2021-03-06 ENCOUNTER — Other Ambulatory Visit: Payer: Self-pay

## 2021-03-06 ENCOUNTER — Encounter (HOSPITAL_COMMUNITY): Payer: Self-pay | Admitting: Cardiology

## 2021-03-06 ENCOUNTER — Ambulatory Visit (HOSPITAL_BASED_OUTPATIENT_CLINIC_OR_DEPARTMENT_OTHER)
Admission: RE | Admit: 2021-03-06 | Discharge: 2021-03-06 | Disposition: A | Discharge status: Home / Self Care | Payer: BC Managed Care – PPO | Source: Ambulatory Visit | Attending: Cardiology | Admitting: Cardiology

## 2021-03-06 ENCOUNTER — Ambulatory Visit (HOSPITAL_COMMUNITY)
Admission: RE | Admit: 2021-03-06 | Discharge: 2021-03-06 | Disposition: A | Discharge status: Home / Self Care | Payer: BC Managed Care – PPO | Source: Ambulatory Visit | Attending: Cardiology | Admitting: Cardiology

## 2021-03-06 VITALS — BP 148/78 | HR 59 | Wt 236.2 lb

## 2021-03-06 DIAGNOSIS — Z87891 Personal history of nicotine dependence: Secondary | ICD-10-CM | POA: Insufficient documentation

## 2021-03-06 DIAGNOSIS — I5022 Chronic systolic (congestive) heart failure: Secondary | ICD-10-CM

## 2021-03-06 DIAGNOSIS — N183 Chronic kidney disease, stage 3 unspecified: Secondary | ICD-10-CM | POA: Diagnosis not present

## 2021-03-06 DIAGNOSIS — Z8249 Family history of ischemic heart disease and other diseases of the circulatory system: Secondary | ICD-10-CM | POA: Insufficient documentation

## 2021-03-06 DIAGNOSIS — Z79899 Other long term (current) drug therapy: Secondary | ICD-10-CM | POA: Insufficient documentation

## 2021-03-06 DIAGNOSIS — I428 Other cardiomyopathies: Secondary | ICD-10-CM | POA: Insufficient documentation

## 2021-03-06 DIAGNOSIS — I130 Hypertensive heart and chronic kidney disease with heart failure and stage 1 through stage 4 chronic kidney disease, or unspecified chronic kidney disease: Secondary | ICD-10-CM | POA: Diagnosis not present

## 2021-03-06 DIAGNOSIS — Z8616 Personal history of COVID-19: Secondary | ICD-10-CM | POA: Insufficient documentation

## 2021-03-06 LAB — BASIC METABOLIC PANEL
Anion gap: 5 (ref 5–15)
BUN: 13 mg/dL (ref 6–20)
CO2: 24 mmol/L (ref 22–32)
Calcium: 8.7 mg/dL — ABNORMAL LOW (ref 8.9–10.3)
Chloride: 109 mmol/L (ref 98–111)
Creatinine, Ser: 1.41 mg/dL — ABNORMAL HIGH (ref 0.61–1.24)
GFR, Estimated: >60 mL/min (ref >60)
Glucose, Bld: 103 mg/dL — ABNORMAL HIGH (ref 70–99)
Potassium: 3.9 mmol/L (ref 3.5–5.1)
Sodium: 138 mmol/L (ref 135–145)

## 2021-03-06 LAB — ECHOCARDIOGRAM COMPLETE
Area-P 1/2: 2.66 cm2
S' Lateral: 2.8 cm

## 2021-03-06 MED ORDER — AMLODIPINE BESYLATE 2.5 MG PO TABS
2.5000 mg | ORAL_TABLET | Freq: Every day | ORAL | 6 refills | Status: DC
  Filled 2021-03-06: qty 30, 30d supply, fill #0
  Filled 2021-04-11: qty 30, 30d supply, fill #1
  Filled 2021-05-19: qty 30, 30d supply, fill #2
  Filled 2021-05-19: qty 30, 30d supply, fill #0

## 2021-03-06 NOTE — Patient Instructions (Addendum)
Start Amlodipine 2.5 mg Daily  Labs done today, your results will be available in MyChart, we will contact you for abnormal readings.  Please follow up with our heart failure pharmacist in 3 weeks  Your physician recommends that you schedule a follow-up appointment in: 1 year, ** PLEASE CALL OUR OFFICE IN AUGUST 2023 TO SCHEDULE APPOINTMENT  If you have any questions or concerns before your next appointment please send [place] a message through [place] or call our office at [phone].    TO LEAVE A MESSAGE FOR THE NURSE SELECT OPTION 2, PLEASE LEAVE A MESSAGE INCLUDING: YOUR NAME DATE OF BIRTH CALL BACK NUMBER REASON FOR CALL**this is important as we prioritize the call backs  YOU WILL RECEIVE A CALL BACK THE SAME DAY AS LONG AS YOU CALL BEFORE 4:00 PM  At the [facility], you and your health needs are our priority. As part of our continuing mission to provide you with exceptional heart care, we have created designated Provider Care Teams. These Care Teams include your primary Cardiologist (physician) and Advanced Practice Providers (APPs- Physician Assistants and Nurse Practitioners) who all work together to provide you with the care you need, when you need it.   You may see any of the following providers on your designated Care Team at your next follow up: Dr Arvilla Meres Dr Carron Curie, NP Robbie Lis, [place] [facility] [place], [place] Karle Plumber, PharmD   Please be sure to bring in all your medications bottles to every appointment.

## 2021-03-07 NOTE — Progress Notes (Signed)
Advanced Heart Failure Clinic Note   Referring Physician: PCP: Storm Frisk, MD Cardiologist: Dr. Shirlee Latch  35 y.o. with PMH of untreated HTN who was admitted to [facility] 02/24/19 w/ acute CHF. He presented with acute dyspnea and was found to be markedly hypertensive and in acute pulmonary edema. He was started on IV diuretics and antihypertensives. 2D echo showed severely reduced LVEF 25-30% with mild LV dilation and mild LV hypertrophy, mildly decreased RV systolic function. LHC/RHC showed normal filling pressures and cardiac output after diuresis, no coronary disease on angiography.  No h/o drugs/ETOH/smoking. Mother with "heart disease" but he is not clear on the diagnosis.   Primary etiology for his HF was felt to be poorly controlled HTN. After he was diuresed back to euvolemic state, he was transitioned to PO lasix. Also treated w/ Entresto, Spironolactone and Coreg. Discharge weight was 231 lb.   Echo in 1/21 showed that EF is significantly improved, up to 55-60%, mild LVH, normal RV. Coronary CTA in 12/21 showed CAC 0 Agatston units and no significant CAD.    Echo was done today and reviewed, EF 55-60%, normal diastolic function, normal RV.   Patient returns for followup of cardiomyopathy. He is doing well.  Working full time.  No significant exertional dyspnea.  No chest pain.  He is generally very active.  No lightheadedness or palpitations.  Weight down 15 lbs.  BP is elevated.   ECG (personally reviewed): NSR, normal    Labs (6/21): K 4.2, creatinine 1.54  Labs (11/21): K 3.6, creatinine 1.47, HS-TnI 24 => 25, BNP 30.  Labs (8/22): K 4.8, creatinine 1.66  PMH: 1. HTN 2. Chronic systolic CHF: Nonischemic cardiomyopathy.  - Echo (10/20): EF 25-30%, mildly decreased RV systolic function.  - LHC/RHC (10/20): No significant CAD.  Mean RA 3, PA 32/10, mean PCWP 12, CI 2.69.  - Echo (1/21): EF 55-60%, mild LVH, normal RV.  - Coronary CTA (12/21): CAC 0 Agatston units and no  significant CAD.   - Echo (8/22): EF 55-60%, normal diastolic function, normal RV.  3. COVID-19 8/22 4. CKD stage 3  Review of Systems: All systems reviewed and negative except as per HPI.   Current Outpatient Medications  Medication Sig Dispense Refill   amLODipine (NORVASC) 2.5 MG tablet Take 1 tablet (2.5 mg total) by mouth daily. 30 tablet 6   carvedilol (COREG) 12.5 MG tablet Take 1 tablet (12.5 mg total) by mouth 2 (two) times daily with a meal. 60 tablet 3   hydrOXYzine (VISTARIL) 25 MG capsule Take 1 capsule (25 mg total) by mouth every 8 (eight) hours as needed for anxiety. (Patient not taking: Reported on 01/01/2021) 60 capsule 1   isosorbide-hydrALAZINE (BIDIL) 20-37.5 MG tablet TAKE 2 TABLETS BY MOUTH 3 (THREE) TIMES DAILY. 540 tablet 3   nitroGLYCERIN (NITROSTAT) 0.4 MG SL tablet Place 1 tablet (0.4 mg total) under the tongue every 5 (five) minutes as needed for chest pain. 30 tablet 3   potassium chloride SA (KLOR-CON) 20 MEQ tablet Take 1 tablet (20 mEq total) by mouth 2 (two) times daily. 60 tablet 3   sacubitril-valsartan (ENTRESTO) 97-103 MG TAKE 1 TABLET BY MOUTH 2 (TWO) TIMES DAILY. 180 tablet 3   spironolactone (ALDACTONE) 25 MG tablet Take 1 tablet (25 mg total) by mouth daily. 90 tablet 1   No current facility-administered medications for this encounter.    No Known Allergies    Social History   Socioeconomic History   Marital status: Single  Spouse name: Not on file   Number of children: Not on file   Years of education: Not on file   Highest education level: Not on file  Occupational History   Not on file  Tobacco Use   Smoking status: Former   Smokeless tobacco: Never  Vaping Use   Vaping Use: Never used  Substance and Sexual Activity   Alcohol use: No   Drug use: No   Sexual activity: Not on file  Other Topics Concern   Not on file  Social History Narrative   Not on file   Social Determinants of Health   Financial Resource Strain: Not on  file  Food Insecurity: Not on file  Transportation Needs: Not on file  Physical Activity: Not on file  Stress: Not on file  Social Connections: Not on file  Intimate Partner Violence: Not on file      Family History  Problem Relation Age of Onset   Hyperlipidemia Mother    Diabetes Mother    Stroke Mother    Hypertension Mother    Hypertension Father     Vitals:   03/06/21 1246  BP: (!) 148/78  Pulse: (!) 59  SpO2: 99%  Weight: 107.1 kg (236 lb 3.2 oz)   PHYSICAL EXAM: General: NAD Neck: No JVD, no thyromegaly or thyroid nodule.  Lungs: Clear to auscultation bilaterally with normal respiratory effort. CV: Nondisplaced PMI.  Heart regular S1/S2, no S3/S4, no murmur.  No peripheral edema.  No carotid bruit.  Normal pedal pulses.  Abdomen: Soft, nontender, no hepatosplenomegaly, no distention.  Skin: Intact without lesions or rashes.  Neurologic: Alert and oriented x 3.  Psych: Normal affect. Extremities: No clubbing or cyanosis.  HEENT: Normal.   ASSESSMENT & PLAN:  1. Chronic systolic CHF => chronic HF with recovered EF: Nonischemic cardiomyopathy.  Echo 10/20 w/ reduced LVEF 25-30% with mild LV dilation and mild LV hypertrophy, mildly decreased RV systolic function.  Cause of cardiomyopathy may be long-standing uncontrolled HTN versus viral myocarditis. LHC/RHC showed normal filling pressures and cardiac output after diuresis, no coronary disease on angiography.  No drugs/ETOH/smoking.  Mother with "heart disease" but he is not clear on the diagnosis.  Echo in 1/21 with significant improvement, EF up to 55-60% with medical treatment. Echo was done today and reviewed, EF remains 55-60%.  NYHA class I symptoms, euvolemic.  - Continue Entresto 97/103 bid - No Lasix needed.   - Continue spironolactone 25 mg daily.  BMET today.  - Continue Coreg 12.5 mg bid, will not increase with low HR.     - Continue Bidil 2 tabs tid.  2. HTN: This may be the cause of his cardiomyopathy.   BP is still mildly elevated.  - Add amlodipine 2.5 mg daily.  3. CKD stage 3: Last creatinine 1.66.  - BMET today.  - When BP is controlled, would add on SGLT2 inhibitor for renoprotection.    Followup with me in 1 year but will need q3 month BMETs. See HF pharmacist in 3 wks for BP med titration.   Marca Ancona, MD 03/07/21

## 2021-03-14 ENCOUNTER — Other Ambulatory Visit: Payer: Self-pay

## 2021-03-18 ENCOUNTER — Emergency Department (HOSPITAL_BASED_OUTPATIENT_CLINIC_OR_DEPARTMENT_OTHER)
Admission: EM | Admit: 2021-03-18 | Discharge: 2021-03-18 | Disposition: A | Discharge status: Home / Self Care | Payer: No Typology Code available for payment source | Attending: Emergency Medicine | Admitting: Emergency Medicine

## 2021-03-18 ENCOUNTER — Emergency Department (HOSPITAL_BASED_OUTPATIENT_CLINIC_OR_DEPARTMENT_OTHER): Payer: No Typology Code available for payment source

## 2021-03-18 ENCOUNTER — Other Ambulatory Visit: Payer: Self-pay

## 2021-03-18 ENCOUNTER — Encounter (HOSPITAL_BASED_OUTPATIENT_CLINIC_OR_DEPARTMENT_OTHER): Payer: Self-pay | Admitting: *Deleted

## 2021-03-18 DIAGNOSIS — Z87891 Personal history of nicotine dependence: Secondary | ICD-10-CM | POA: Diagnosis not present

## 2021-03-18 DIAGNOSIS — X509XXA Other and unspecified overexertion or strenuous movements or postures, initial encounter: Secondary | ICD-10-CM | POA: Insufficient documentation

## 2021-03-18 DIAGNOSIS — Z955 Presence of coronary angioplasty implant and graft: Secondary | ICD-10-CM | POA: Diagnosis not present

## 2021-03-18 DIAGNOSIS — I130 Hypertensive heart and chronic kidney disease with heart failure and stage 1 through stage 4 chronic kidney disease, or unspecified chronic kidney disease: Secondary | ICD-10-CM | POA: Diagnosis not present

## 2021-03-18 DIAGNOSIS — S63502A Unspecified sprain of left wrist, initial encounter: Secondary | ICD-10-CM | POA: Diagnosis not present

## 2021-03-18 DIAGNOSIS — I5022 Chronic systolic (congestive) heart failure: Secondary | ICD-10-CM | POA: Diagnosis not present

## 2021-03-18 DIAGNOSIS — S6392XA Sprain of unspecified part of left wrist and hand, initial encounter: Secondary | ICD-10-CM | POA: Insufficient documentation

## 2021-03-18 DIAGNOSIS — N182 Chronic kidney disease, stage 2 (mild): Secondary | ICD-10-CM | POA: Insufficient documentation

## 2021-03-18 DIAGNOSIS — S6992XA Unspecified injury of left wrist, hand and finger(s), initial encounter: Secondary | ICD-10-CM | POA: Diagnosis present

## 2021-03-18 DIAGNOSIS — Z79899 Other long term (current) drug therapy: Secondary | ICD-10-CM | POA: Diagnosis not present

## 2021-03-18 MED ORDER — KETOROLAC TROMETHAMINE 30 MG/ML IJ SOLN
30.0000 mg | Freq: Once | INTRAMUSCULAR | Status: AC
  Administered 2021-03-18: 30 mg via INTRAMUSCULAR
  Filled 2021-03-18: qty 1

## 2021-03-18 NOTE — ED Notes (Signed)
Presents from his work, states was lifting, moving a large box and left wrist was bent back, no obvious swelling noted, no deformity noted, but does c/o pain at left radial / wrist area. Capillary refill WNL, temp WNL, sensation WNL

## 2021-03-18 NOTE — ED Provider Notes (Signed)
[place] EMERGENCY DEPARTMENT Provider Note   CSN: [id number] Arrival date & time: 03/18/21  0843     History Chief Complaint  Patient presents with   Wrist Pain    Derek Reed is a 35 y.o. male.   Wrist Pain This is a new problem. The current episode started 1 to 2 hours ago. The problem occurs constantly. The problem has not changed since onset.Pertinent negatives include no chest pain, no abdominal pain, no headaches and no shortness of breath. Exacerbated by: Movement. The symptoms are relieved by rest. He has tried nothing for the symptoms. The treatment provided no relief.   35 year old male presenting to the emergency department with left wrist pain.  The patient states that he was moving a large box and it accidentally bent his left wrist back.  He has since developed sharp shooting and nonradiating pain that was sudden in onset, moderate in severity, with some associated swelling, no deformity.  He denies any numbness or weakness of his left hand.  Past Medical History:  Diagnosis Date   Acute CHF (congestive heart failure) ([place]) 02/24/2019   Anxiety    Hypertension    Obesity    Peritonsillar abscess     Patient Active Problem List   Diagnosis Date Noted   HTN (hypertension) 02/24/2019   CKD (chronic kidney disease), stage II [id number]   Chronic systolic heart failure (HCC)due to Hypertension    GAD (generalized anxiety disorder) 03/26/2014   BMI 36.0-36.9,adult 03/26/2014    Past Surgical History:  Procedure Laterality Date   INCISION AND DRAINAGE OF PERITONSILLAR ABCESS     RIGHT/LEFT HEART CATH AND CORONARY ANGIOGRAPHY N/A 02/24/2019   Procedure: RIGHT/LEFT HEART CATH AND CORONARY ANGIOGRAPHY;  Surgeon: Larey Dresser, MD;  Location: [facility];  Service: Cardiovascular;  Laterality: N/A;       Family History  Problem Relation Age of Onset   Hyperlipidemia Mother    Diabetes Mother    Stroke Mother    Hypertension Mother     Hypertension Father     Social History   Tobacco Use   Smoking status: Former   Smokeless tobacco: Never  Scientific laboratory technician Use: Never used  Substance Use Topics   Alcohol use: No   Drug use: No    Home Medications Prior to Admission medications   Medication Sig Start Date End Date Taking? Authorizing Provider  amLODipine (NORVASC) 2.5 MG tablet Take 1 tablet (2.5 mg total) by mouth daily. 03/06/21  Yes Larey Dresser, MD  carvedilol (COREG) 12.5 MG tablet Take 1 tablet (12.5 mg total) by mouth 2 (two) times daily with a meal. 01/01/21 01/01/22 Yes Elsie Stain, MD  isosorbide-hydrALAZINE (BIDIL) 20-37.5 MG tablet TAKE 2 TABLETS BY MOUTH 3 (THREE) TIMES DAILY. 01/01/21 01/01/22 Yes Elsie Stain, MD  potassium chloride SA (KLOR-CON) 20 MEQ tablet Take 1 tablet (20 mEq total) by mouth 2 (two) times daily. 01/01/21 01/01/22 Yes Elsie Stain, MD  spironolactone (ALDACTONE) 25 MG tablet Take 1 tablet (25 mg total) by mouth daily. 01/01/21 01/01/22 Yes Elsie Stain, MD  hydrOXYzine (VISTARIL) 25 MG capsule Take 1 capsule (25 mg total) by mouth every 8 (eight) hours as needed for anxiety. Patient not taking: No sig reported 08/29/20   Elsie Stain, MD  nitroGLYCERIN (NITROSTAT) 0.4 MG SL tablet Place 1 tablet (0.4 mg total) under the tongue every 5 (five) minutes as needed for chest pain. 08/29/20   Joya Gaskins,  Burnett Harry, MD  sacubitril-valsartan (ENTRESTO) 97-103 MG TAKE 1 TABLET BY MOUTH 2 (TWO) TIMES DAILY. 03/03/21 03/03/22  Larey Dresser, MD    Allergies    Patient has no known allergies.  Review of Systems   Review of Systems  Constitutional:  Negative for chills and fever.  HENT:  Negative for ear pain and sore throat.   Eyes:  Negative for pain and visual disturbance.  Respiratory:  Negative for cough and shortness of breath.   Cardiovascular:  Negative for chest pain and palpitations.  Gastrointestinal:  Negative for abdominal pain and vomiting.   Genitourinary:  Negative for dysuria and hematuria.  Musculoskeletal:  Positive for arthralgias and joint swelling. Negative for back pain.  Skin:  Negative for color change and rash.  Neurological:  Negative for seizures, syncope and headaches.  All other systems reviewed and are negative.  Physical Exam Updated Vital Signs BP 139/90 (BP Location: Right Arm)   Pulse (!) 52   Temp 97.8 F (36.6 C) (Oral)   Resp 16   SpO2 100%   Physical Exam Vitals and nursing note reviewed.  Constitutional:      General: He is not in acute distress.    Appearance: He is well-developed.  HENT:     Head: Normocephalic and atraumatic.  Eyes:     Conjunctiva/sclera: Conjunctivae normal.     Pupils: Pupils are equal, round, and reactive to light.  Cardiovascular:     Rate and Rhythm: Normal rate and regular rhythm.     Heart sounds: No murmur heard. Pulmonary:     Effort: Pulmonary effort is normal. No respiratory distress.     Breath sounds: Normal breath sounds.  Abdominal:     General: There is no distension.     Palpations: Abdomen is soft.     Tenderness: There is no abdominal tenderness. There is no guarding.  Musculoskeletal:        General: Swelling and tenderness present. No deformity or signs of injury.     Cervical back: Normal range of motion and neck supple.     Comments: Left hand and wrist: Swelling and tenderness to palpation of the volar and radial aspect of the wrist with no specific bony tenderness, 2+ radial pulses, no clear evidence of flexor tendon injury.  No anatomic snuffbox tenderness.  Skin:    General: Skin is warm and dry.     Findings: No lesion or rash.  Neurological:     General: No focal deficit present.     Mental Status: He is alert. Mental status is at baseline.     Comments: Intact sensation to light touch throughout the hands in all nerve distributions, intact motor function of the hand along the median, ulnar, radial nerve distributions.    ED  Results / Procedures / Treatments   Labs (all labs ordered are listed, but only abnormal results are displayed) Labs Reviewed - No data to display  EKG None  Radiology DG Wrist Complete Left  Result Date: 03/18/2021 CLINICAL DATA:  injury to left wrist, primary pain site is left radial area, no swelling is noted EXAM: LEFT WRIST - COMPLETE 3+ VIEW COMPARISON:  None. FINDINGS: There is no evidence of fracture or dislocation. There is no evidence of arthropathy or other focal bone abnormality. Soft tissues are unremarkable. IMPRESSION: Negative. Electronically Signed   By: Macy Mis M.D.   On: 03/18/2021 09:14    Procedures Procedures   Medications Ordered in ED Medications  ketorolac (  TORADOL) 30 MG/ML injection 30 mg (has no administration in time range)    ED Course  I have reviewed the triage vital signs and the nursing notes.  Pertinent labs & imaging results that were available during my care of the patient were reviewed by me and considered in my medical decision making (see chart for details).    MDM Rules/Calculators/A&P                           35 year old male presenting to the emergency department with left wrist pain.  The patient states that he was moving a large box and it accidentally bent his left wrist back.  He has since developed sharp shooting and nonradiating pain that was sudden in onset, moderate in severity, with some associated swelling, no deformity.  He denies any numbness or weakness of his left hand.  On arrival, the patient's left hand was neurovascularly intact.  There was some swelling and mild tenderness about the volar and radial aspect of the wrist.  2+ radial pulses with intact motor function along the median, ulnar and radial nerve distributions.  No clear flexor tendon injury.  Concern for possible wrist sprain.  X-ray imaging was obtained and was negative for fracture dislocation.  The patient was advised rest, intermittent ice and heat,  NSAIDs for pain control, immobilization with a wrist splint and follow-up with sports medicine in clinic for reassessment in 1 week when swelling improves.  Final Clinical Impression(s) / ED Diagnoses Final diagnoses:  Sprain of left wrist, initial encounter    Rx / DC Orders ED Discharge Orders          Ordered    AMB referral to sports medicine        03/18/21 1000             Ernie Avena, MD 03/18/21 1004

## 2021-03-18 NOTE — Discharge Instructions (Addendum)
Your x-ray imaging showing no evidence of fracture or dislocation.  You may have sprained your wrist or torn a tendon given the swelling.  There was no evidence of neurologic or vascular compromise to your hand and your tendon function appears to be intact.  Recommend rest, immobilization with a wrist splint, NSAIDs for pain control, intermittent heat and ice for swelling and follow-up with sports medicine in clinic within the next week once the swelling improves.

## 2021-03-19 ENCOUNTER — Encounter: Payer: Self-pay | Admitting: Critical Care Medicine

## 2021-03-19 ENCOUNTER — Other Ambulatory Visit: Payer: Self-pay

## 2021-03-19 ENCOUNTER — Telehealth: Payer: Self-pay | Admitting: Critical Care Medicine

## 2021-03-19 ENCOUNTER — Ambulatory Visit: Payer: BC Managed Care – PPO | Attending: Critical Care Medicine | Admitting: Critical Care Medicine

## 2021-03-19 VITALS — BP 133/87 | HR 59 | Resp 16 | Wt 236.4 lb

## 2021-03-19 DIAGNOSIS — N182 Chronic kidney disease, stage 2 (mild): Secondary | ICD-10-CM | POA: Diagnosis not present

## 2021-03-19 DIAGNOSIS — I5022 Chronic systolic (congestive) heart failure: Secondary | ICD-10-CM | POA: Diagnosis not present

## 2021-03-19 DIAGNOSIS — F411 Generalized anxiety disorder: Secondary | ICD-10-CM

## 2021-03-19 DIAGNOSIS — Z6836 Body mass index (BMI) 36.0-36.9, adult: Secondary | ICD-10-CM | POA: Diagnosis not present

## 2021-03-19 DIAGNOSIS — I10 Essential (primary) hypertension: Secondary | ICD-10-CM

## 2021-03-19 MED ORDER — POTASSIUM CHLORIDE CRYS ER 20 MEQ PO TBCR
20.0000 meq | EXTENDED_RELEASE_TABLET | Freq: Two times a day (BID) | ORAL | 3 refills | Status: DC
  Filled 2021-04-21 – 2021-05-26 (×2): qty 60, 30d supply, fill #0
  Filled 2021-06-25: qty 60, 30d supply, fill #1
  Filled 2021-07-28: qty 60, 30d supply, fill #2

## 2021-03-19 MED ORDER — SPIRONOLACTONE 25 MG PO TABS
25.0000 mg | ORAL_TABLET | Freq: Every day | ORAL | 1 refills | Status: DC
  Filled 2021-03-19 – 2021-04-14 (×2): qty 90, 90d supply, fill #0
  Filled 2021-07-14 (×2): qty 30, 30d supply, fill #0
  Filled 2021-08-13: qty 30, 30d supply, fill #1
  Filled 2021-09-01: qty 30, 30d supply, fill #2

## 2021-03-19 MED ORDER — ISOSORB DINITRATE-HYDRALAZINE 20-37.5 MG PO TABS
ORAL_TABLET | ORAL | 3 refills | Status: DC
  Filled 2021-03-19: qty 540, fill #0
  Filled 2021-07-10 (×2): qty 180, 30d supply, fill #0
  Filled 2021-08-28: qty 180, 30d supply, fill #1
  Filled 2021-10-16: qty 180, 30d supply, fill #2

## 2021-03-19 MED ORDER — CARVEDILOL 12.5 MG PO TABS
12.5000 mg | ORAL_TABLET | Freq: Two times a day (BID) | ORAL | 3 refills | Status: DC
  Filled 2021-03-19 – 2021-07-10 (×4): qty 60, 30d supply, fill #0
  Filled 2021-07-10 – 2021-08-11 (×3): qty 60, 30d supply, fill #1

## 2021-03-19 NOTE — Patient Instructions (Signed)
We will have Asante our clinical social worker connect with you for your anxiety  Refills on all your medications available at our pharmacy the your potassium is ready to pick up today  No change in medications  Please have all your medicines already taken the morning you go see the clinical pharmacist at cardiology  An appointment will be made in 2 weeks on a Thursday for you to receive the Prevnar 20 pneumococcal vaccine  Return to see Dr. Delford Field 4 months  Focus on a healthy diet as we discussed see attached

## 2021-03-19 NOTE — Assessment & Plan Note (Signed)
Chronic systolic heart failure and he has preserved his ejection fraction at this time  Continue current medications

## 2021-03-19 NOTE — Progress Notes (Signed)
Established Patient Office Visit  Subjective:  Patient ID: Derek Reed, male    DOB: 25-Jul-1985  Age: 35 y.o. MRN: [id number]  CC:  Chief Complaint  Patient presents with   Hypertension    HPI Kanav Kazmierczak presents for primary care follow-up.  Patient has chronic systolic heart failure and has had recovered his ejection fraction.  Patient still has significant hypertension ejection fraction remains now 55 to 60%.  Patient remains euvolemic.  He tends to run low potassium as per last week's lab data.  Patient states he is eating a lot of smoothies for meals.  Patient is under a great deal of stress and would be interested in speaking to a behavioral therapist.  He did hurt his left wrist at work he works as a Optician, dispensing on a Production designer, theatre/television/film.  Patient went to the emergency room and was given over-the-counter relief.  Patient does have a wrist brace to wear at work.  X-rays were negative.  Patient arrives with blood pressure 138/85 he had not taken his medications this morning.  He has been compliant with his amlodipine, Entresto, Coreg, BiDil, potassium, spironolactone.  Below is documentation for the last heart failure visit a week ago 1. Chronic systolic CHF => chronic HF with recovered EF: Nonischemic cardiomyopathy.  Echo 10/20 w/ reduced LVEF 25-30% with mild LV dilation and mild LV hypertrophy, mildly decreased RV systolic function.  Cause of cardiomyopathy may be long-standing uncontrolled HTN versus viral myocarditis. LHC/RHC showed normal filling pressures and cardiac output after diuresis, no coronary disease on angiography.  No drugs/ETOH/smoking.  Mother with "heart disease" but he is not clear on the diagnosis.  Echo in 1/21 with significant improvement, EF up to 55-60% with medical treatment. Echo was done today and reviewed, EF remains 55-60%.  NYHA class I symptoms, euvolemic.  - Continue Entresto 97/103 bid - No Lasix needed.   - Continue spironolactone 25 mg daily.   BMET today.  - Continue Coreg 12.5 mg bid, will not increase with low HR.     - Continue Bidil 2 tabs tid.  2. HTN: This may be the cause of his cardiomyopathy.  BP is still mildly elevated.  - Add amlodipine 2.5 mg daily.  3. CKD stage 3: Last creatinine 1.66.  - BMET today.  - When BP is controlled, would add on SGLT2 inhibitor for renoprotection.     Patient's renal function is stable  Patient did agree and received a pneumonia vaccine he had already had a flu vaccine earlier    Past Medical History:  Diagnosis Date   Acute CHF (congestive heart failure) ([place]) 02/24/2019   Anxiety    Hypertension    Obesity    Peritonsillar abscess     Past Surgical History:  Procedure Laterality Date   INCISION AND DRAINAGE OF PERITONSILLAR ABCESS     RIGHT/LEFT HEART CATH AND CORONARY ANGIOGRAPHY N/A 02/24/2019   Procedure: RIGHT/LEFT HEART CATH AND CORONARY ANGIOGRAPHY;  Surgeon: Larey Dresser, MD;  Location: [facility];  Service: Cardiovascular;  Laterality: N/A;    Family History  Problem Relation Age of Onset   Hyperlipidemia Mother    Diabetes Mother    Stroke Mother    Hypertension Mother    Hypertension Father     Social History   Socioeconomic History   Marital status: Single    Spouse name: Not on file   Number of children: Not on file   Years of education: Not on file  Highest education level: Not on file  Occupational History   Not on file  Tobacco Use   Smoking status: Former   Smokeless tobacco: Never  Vaping Use   Vaping Use: Never used  Substance and Sexual Activity   Alcohol use: No   Drug use: No   Sexual activity: Not on file  Other Topics Concern   Not on file  Social History Narrative   Not on file   Social Determinants of Health   Financial Resource Strain: Not on file  Food Insecurity: Not on file  Transportation Needs: Not on file  Physical Activity: Not on file  Stress: Not on file  Social Connections: Not on file   Intimate Partner Violence: Not on file    Outpatient Medications Prior to Visit  Medication Sig Dispense Refill   amLODipine (NORVASC) 2.5 MG tablet Take 1 tablet (2.5 mg total) by mouth daily. 30 tablet 6   nitroGLYCERIN (NITROSTAT) 0.4 MG SL tablet Place 1 tablet (0.4 mg total) under the tongue every 5 (five) minutes as needed for chest pain. 30 tablet 3   sacubitril-valsartan (ENTRESTO) 97-103 MG TAKE 1 TABLET BY MOUTH 2 (TWO) TIMES DAILY. 180 tablet 3   carvedilol (COREG) 12.5 MG tablet Take 1 tablet (12.5 mg total) by mouth 2 (two) times daily with a meal. 60 tablet 3   isosorbide-hydrALAZINE (BIDIL) 20-37.5 MG tablet TAKE 2 TABLETS BY MOUTH 3 (THREE) TIMES DAILY. 540 tablet 3   nitroGLYCERIN (NITROSTAT) 0.4 MG SL tablet Place under the tongue.     potassium chloride SA (KLOR-CON) 20 MEQ tablet Take 1 tablet (20 mEq total) by mouth 2 (two) times daily. 60 tablet 3   spironolactone (ALDACTONE) 25 MG tablet Take 1 tablet (25 mg total) by mouth daily. 90 tablet 1   hydrOXYzine (VISTARIL) 25 MG capsule Take 1 capsule (25 mg total) by mouth every 8 (eight) hours as needed for anxiety. (Patient not taking: No sig reported) 60 capsule 1   No facility-administered medications prior to visit.    No Known Allergies  ROS Review of Systems  Constitutional: Negative.   HENT: Negative.  Negative for ear pain, postnasal drip, rhinorrhea, sinus pressure, sore throat, trouble swallowing and voice change.   Eyes: Negative.   Respiratory: Negative.  Negative for apnea, cough, choking, chest tightness, shortness of breath, wheezing and stridor.   Cardiovascular: Negative.  Negative for chest pain, palpitations and leg swelling.  Gastrointestinal: Negative.  Negative for abdominal distention, abdominal pain, nausea and vomiting.  Genitourinary: Negative.   Musculoskeletal: Negative.  Negative for arthralgias and myalgias.  Skin: Negative.  Negative for rash.  Allergic/Immunologic: Negative.   Negative for environmental allergies and food allergies.  Neurological: Negative.  Negative for dizziness, syncope, weakness and headaches.  Hematological: Negative.  Negative for adenopathy. Does not bruise/bleed easily.  Psychiatric/Behavioral: Negative.  Negative for agitation and sleep disturbance. The patient is not nervous/anxious.      Objective:    Physical Exam Vitals reviewed.  Constitutional:      Appearance: Normal appearance. He is well-developed. He is obese. He is not diaphoretic.  HENT:     Head: Normocephalic and atraumatic.     Nose: No nasal deformity, septal deviation, mucosal edema or rhinorrhea.     Right Sinus: No maxillary sinus tenderness or frontal sinus tenderness.     Left Sinus: No maxillary sinus tenderness or frontal sinus tenderness.     Mouth/Throat:     Pharynx: No oropharyngeal exudate.  Eyes:  General: No scleral icterus.    Conjunctiva/sclera: Conjunctivae normal.     Pupils: Pupils are equal, round, and reactive to light.  Neck:     Thyroid: No thyromegaly.     Vascular: No carotid bruit or JVD.     Trachea: Trachea normal. No tracheal tenderness or tracheal deviation.  Cardiovascular:     Rate and Rhythm: Normal rate and regular rhythm.     Chest Wall: PMI is not displaced.     Pulses: Normal pulses. No decreased pulses.     Heart sounds: Normal heart sounds, S1 normal and S2 normal. Heart sounds not distant. No murmur heard. No systolic murmur is present.  No diastolic murmur is present.    No friction rub. No gallop. No S3 or S4 sounds.  Pulmonary:     Effort: Pulmonary effort is normal. No tachypnea, accessory muscle usage or respiratory distress.     Breath sounds: Normal breath sounds. No stridor. No decreased breath sounds, wheezing, rhonchi or rales.  Chest:     Chest wall: No tenderness.  Abdominal:     General: Bowel sounds are normal. There is no distension.     Palpations: Abdomen is soft. Abdomen is not rigid.      Tenderness: There is no abdominal tenderness. There is no guarding or rebound.  Musculoskeletal:        General: Normal range of motion.     Cervical back: Normal range of motion and neck supple. No edema, erythema or rigidity. No muscular tenderness. Normal range of motion.  Lymphadenopathy:     Head:     Right side of head: No submental or submandibular adenopathy.     Left side of head: No submental or submandibular adenopathy.     Cervical: No cervical adenopathy.  Skin:    General: Skin is warm and dry.     Coloration: Skin is not pale.     Findings: No rash.     Nails: There is no clubbing.  Neurological:     Mental Status: He is alert and oriented to person, place, and time.     Sensory: No sensory deficit.  Psychiatric:        Attention and Perception: Attention and perception normal.        Mood and Affect: Affect normal. Mood is anxious.        Speech: Speech normal.        Behavior: Behavior normal. Behavior is cooperative.        Thought Content: Thought content normal.        Cognition and Memory: Cognition and memory normal.        Judgment: Judgment normal.    BP (!) 154/103   Pulse (!) 59   Resp 16   Wt 236 lb 6.4 oz (107.2 kg)   SpO2 99%   BMI 38.16 kg/m  Wt Readings from Last 3 Encounters:  03/19/21 236 lb 6.4 oz (107.2 kg)  03/06/21 236 lb 3.2 oz (107.1 kg)  01/01/21 243 lb 6.4 oz (110.4 kg)     Health Maintenance Due  Topic Date Due   Pneumococcal Vaccine 60-48 Years old (1 - PCV) Never done    There are no preventive care reminders to display for this patient.  No results found for: TSH Lab Results  Component Value Date   WBC 4.7 12/22/2020   HGB 13.2 12/22/2020   HCT 41.1 12/22/2020   MCV 72.2 (L) 12/22/2020   PLT 275 12/22/2020   Lab  Results  Component Value Date   NA 138 03/06/2021   K 3.9 03/06/2021   CO2 24 03/06/2021   GLUCOSE 103 (H) 03/06/2021   BUN 13 03/06/2021   CREATININE 1.41 (H) 03/06/2021   BILITOT 0.3 12/22/2020    ALKPHOS 67 12/22/2020   AST 25 12/22/2020   ALT 23 12/22/2020   PROT 7.8 12/22/2020   ALBUMIN 3.8 12/22/2020   CALCIUM 8.7 (L) 03/06/2021   ANIONGAP 5 03/06/2021   EGFR 54 (L) 01/01/2021   Lab Results  Component Value Date   CHOL 183 02/24/2019   Lab Results  Component Value Date   HDL 31 (L) 02/24/2019   Lab Results  Component Value Date   LDLCALC 139 (H) 02/24/2019   Lab Results  Component Value Date   TRIG 65 02/24/2019   Lab Results  Component Value Date   CHOLHDL 5.9 02/24/2019   Lab Results  Component Value Date   HGBA1C 6.2 (H) 02/24/2019      Assessment & Plan:   Problem List Items Addressed This Visit       Cardiovascular and Mediastinum   HTN (hypertension)    Hypertension at goal he needs to take his medications the morning of his clinic visits before he arrives  I did refill potassium he needs to stay on the 20 mill equivalents twice daily dosing        Relevant Medications   carvedilol (COREG) 12.5 MG tablet   isosorbide-hydrALAZINE (BIDIL) 20-37.5 MG tablet   spironolactone (ALDACTONE) 25 MG tablet   Chronic systolic heart failure (HCC)due to Hypertension    Chronic systolic heart failure and he has preserved his ejection fraction at this time  Continue current medications      Relevant Medications   carvedilol (COREG) 12.5 MG tablet   isosorbide-hydrALAZINE (BIDIL) 20-37.5 MG tablet   spironolactone (ALDACTONE) 25 MG tablet     Genitourinary   CKD (chronic kidney disease), stage II    Chronic kidney disease stable at this time stage II        Other   GAD (generalized anxiety disorder)    Failed to respond to hydroxyzine  Patient with stress over family matters  Referral to licensed clinical social worker made      BMI 36.0-36.9,adult    Went over proper diet for this patient       Meds ordered this encounter  Medications   carvedilol (COREG) 12.5 MG tablet    Sig: Take 1 tablet (12.5 mg total) by mouth 2 (two)  times daily with a meal.    Dispense:  60 tablet    Refill:  3   isosorbide-hydrALAZINE (BIDIL) 20-37.5 MG tablet    Sig: TAKE 2 TABLETS BY MOUTH 3 (THREE) TIMES DAILY.    Dispense:  540 tablet    Refill:  3   potassium chloride SA (KLOR-CON) 20 MEQ tablet    Sig: Take 1 tablet (20 mEq total) by mouth 2 (two) times daily.    Dispense:  60 tablet    Refill:  3    For future refills   spironolactone (ALDACTONE) 25 MG tablet    Sig: Take 1 tablet (25 mg total) by mouth daily.    Dispense:  90 tablet    Refill:  1    Follow-up: Return in about 4 months (around 07/17/2021).    Asencion Noble, MD

## 2021-03-19 NOTE — Assessment & Plan Note (Signed)
Hypertension at goal he needs to take his medications the morning of his clinic visits before he arrives  I did refill potassium he needs to stay on the 20 mill equivalents twice daily dosing

## 2021-03-19 NOTE — Telephone Encounter (Signed)
Needs behavioral Rx for severe anxiety.  Failed hydroxyzine

## 2021-03-19 NOTE — Assessment & Plan Note (Addendum)
Chronic kidney disease stable at this time stage II

## 2021-03-19 NOTE — Assessment & Plan Note (Signed)
Failed to respond to hydroxyzine  Patient with stress over family matters  Referral to licensed clinical social worker made

## 2021-03-19 NOTE — Assessment & Plan Note (Signed)
Went over proper diet for this patient

## 2021-03-27 ENCOUNTER — Inpatient Hospital Stay (HOSPITAL_COMMUNITY)
Admission: RE | Admit: 2021-03-27 | Discharge: 2021-03-27 | Disposition: A | Discharge status: Home / Self Care | Payer: BC Managed Care – PPO | Source: Ambulatory Visit

## 2021-03-28 NOTE — Telephone Encounter (Signed)
First attempt to reach pt to schedule an appt. Called both home and mobile numbers in pt's chart. No answer, left vm on both phones.

## 2021-04-09 ENCOUNTER — Other Ambulatory Visit: Payer: Self-pay

## 2021-04-09 ENCOUNTER — Other Ambulatory Visit (HOSPITAL_BASED_OUTPATIENT_CLINIC_OR_DEPARTMENT_OTHER): Payer: Self-pay

## 2021-04-09 ENCOUNTER — Emergency Department (HOSPITAL_BASED_OUTPATIENT_CLINIC_OR_DEPARTMENT_OTHER)
Admission: EM | Admit: 2021-04-09 | Discharge: 2021-04-09 | Disposition: A | Discharge status: Home / Self Care | Payer: BC Managed Care – PPO | Attending: Emergency Medicine | Admitting: Emergency Medicine

## 2021-04-09 ENCOUNTER — Encounter (HOSPITAL_BASED_OUTPATIENT_CLINIC_OR_DEPARTMENT_OTHER): Payer: Self-pay

## 2021-04-09 DIAGNOSIS — E860 Dehydration: Secondary | ICD-10-CM | POA: Diagnosis not present

## 2021-04-09 DIAGNOSIS — R1084 Generalized abdominal pain: Secondary | ICD-10-CM | POA: Diagnosis not present

## 2021-04-09 DIAGNOSIS — R197 Diarrhea, unspecified: Secondary | ICD-10-CM | POA: Diagnosis not present

## 2021-04-09 DIAGNOSIS — Z79899 Other long term (current) drug therapy: Secondary | ICD-10-CM | POA: Diagnosis not present

## 2021-04-09 DIAGNOSIS — N182 Chronic kidney disease, stage 2 (mild): Secondary | ICD-10-CM | POA: Diagnosis not present

## 2021-04-09 DIAGNOSIS — A084 Viral intestinal infection, unspecified: Secondary | ICD-10-CM | POA: Diagnosis not present

## 2021-04-09 DIAGNOSIS — Z87891 Personal history of nicotine dependence: Secondary | ICD-10-CM | POA: Insufficient documentation

## 2021-04-09 DIAGNOSIS — I130 Hypertensive heart and chronic kidney disease with heart failure and stage 1 through stage 4 chronic kidney disease, or unspecified chronic kidney disease: Secondary | ICD-10-CM | POA: Diagnosis not present

## 2021-04-09 DIAGNOSIS — I5022 Chronic systolic (congestive) heart failure: Secondary | ICD-10-CM | POA: Diagnosis not present

## 2021-04-09 DIAGNOSIS — R109 Unspecified abdominal pain: Secondary | ICD-10-CM | POA: Diagnosis not present

## 2021-04-09 LAB — COMPREHENSIVE METABOLIC PANEL
ALT: 15 U/L (ref 0–44)
AST: 18 U/L (ref 15–41)
Albumin: 3.6 g/dL (ref 3.5–5.0)
Alkaline Phosphatase: 72 U/L (ref 38–126)
Anion gap: 7 (ref 5–15)
BUN: 17 mg/dL (ref 6–20)
CO2: 22 mmol/L (ref 22–32)
Calcium: 8.4 mg/dL — ABNORMAL LOW (ref 8.9–10.3)
Chloride: 109 mmol/L (ref 98–111)
Creatinine, Ser: 1.44 mg/dL — ABNORMAL HIGH (ref 0.61–1.24)
GFR, Estimated: >60 mL/min (ref >60)
Glucose, Bld: 94 mg/dL (ref 70–99)
Potassium: 3.9 mmol/L (ref 3.5–5.1)
Sodium: 138 mmol/L (ref 135–145)
Total Bilirubin: 0.3 mg/dL (ref 0.3–1.2)
Total Protein: 6.8 g/dL (ref 6.5–8.1)

## 2021-04-09 LAB — URINALYSIS, ROUTINE W REFLEX MICROSCOPIC
Bilirubin Urine: NEGATIVE
Glucose, UA: NEGATIVE mg/dL
Hgb urine dipstick: NEGATIVE
Ketones, ur: NEGATIVE mg/dL
Leukocytes,Ua: NEGATIVE
Nitrite: NEGATIVE
Protein, ur: NEGATIVE mg/dL
Specific Gravity, Urine: 1.03 (ref 1.005–1.030)
pH: 5.5 (ref 5.0–8.0)

## 2021-04-09 LAB — CBC WITH DIFFERENTIAL/PLATELET
Abs Immature Granulocytes: 0.02 10*3/uL (ref 0.00–0.07)
Basophils Absolute: 0 10*3/uL (ref 0.0–0.1)
Basophils Relative: 0 %
Eosinophils Absolute: 0.3 10*3/uL (ref 0.0–0.5)
Eosinophils Relative: 5 %
HCT: 35.7 % — ABNORMAL LOW (ref 39.0–52.0)
Hemoglobin: 11.4 g/dL — ABNORMAL LOW (ref 13.0–17.0)
Immature Granulocytes: 0 %
Lymphocytes Relative: 54 %
Lymphs Abs: 3.2 10*3/uL (ref 0.7–4.0)
MCH: 23.7 pg — ABNORMAL LOW (ref 26.0–34.0)
MCHC: 31.9 g/dL (ref 30.0–36.0)
MCV: 74.2 fL — ABNORMAL LOW (ref 80.0–100.0)
Monocytes Absolute: 0.6 10*3/uL (ref 0.1–1.0)
Monocytes Relative: 10 %
Neutro Abs: 1.8 10*3/uL (ref 1.7–7.7)
Neutrophils Relative %: 31 %
Platelets: 270 10*3/uL (ref 150–400)
RBC: 4.81 MIL/uL (ref 4.22–5.81)
RDW: 15.5 % (ref 11.5–15.5)
WBC: 5.9 10*3/uL (ref 4.0–10.5)
nRBC: 0 % (ref 0.0–0.2)

## 2021-04-09 LAB — LIPASE, BLOOD: Lipase: 35 U/L (ref 11–51)

## 2021-04-09 MED ORDER — SODIUM CHLORIDE 0.9 % IV BOLUS
500.0000 mL | Freq: Once | INTRAVENOUS | Status: AC
  Administered 2021-04-09: 500 mL via INTRAVENOUS

## 2021-04-09 MED ORDER — DICYCLOMINE HCL 20 MG PO TABS
20.0000 mg | ORAL_TABLET | Freq: Two times a day (BID) | ORAL | 0 refills | Status: DC
  Filled 2021-04-09: qty 20, 10d supply, fill #0

## 2021-04-09 NOTE — Discharge Instructions (Addendum)
Please pick up medication and take as prescribed  Continue to push fluids to stay hydrated. I would also recommend a BLAND diet over the next few days (see attached).   Follow up with your PCP for further evaluation  Return to the ED for any new/worsening symptoms

## 2021-04-09 NOTE — ED Provider Notes (Signed)
[facility] EMERGENCY DEPARTMENT Provider Note   CSN: [id number] Arrival date & time: 04/09/21  1243     History Chief Complaint  Patient presents with   Abdominal Pain    Derek Reed is a 35 y.o. male with PMHx HTN, CKD stage II, and CHF with preserved EF who presents to the ED today with complaints of gradual onset, constant, achy, diffuse, abdominal pain for the past 5 days.  Patient also complains of watery diarrhea.  He states his symptoms started on Thanksgiving night after he ate a salad with popcorn chicken.  He states his daughter ate this out as well and stated that her stomach was upset.  Patient states that the pain has since radiated to his bilateral sides was a PE.  Denies any nausea or vomiting.  No fevers.  He states that his diarrhea started a couple of days ago.  No blood.  He has not taken anything over-the-counter for his symptoms.  No recent antibiotic use.  No previous abdominal surgeries.   The history is provided by the patient and medical records.      Past Medical History:  Diagnosis Date   Acute CHF (congestive heart failure) (HCC) 02/24/2019   Anxiety    Hypertension    Obesity    Peritonsillar abscess     Patient Active Problem List   Diagnosis Date Noted   HTN (hypertension) 02/24/2019   CKD (chronic kidney disease), stage II 02/24/2019   Chronic systolic heart failure (HCC)due to Hypertension    GAD (generalized anxiety disorder) 03/26/2014   BMI 36.0-36.9,adult 03/26/2014    Past Surgical History:  Procedure Laterality Date   INCISION AND DRAINAGE OF PERITONSILLAR ABCESS     RIGHT/LEFT HEART CATH AND CORONARY ANGIOGRAPHY N/A 02/24/2019   Procedure: RIGHT/LEFT HEART CATH AND CORONARY ANGIOGRAPHY;  Surgeon: Laurey Morale, MD;  Location: [facility];  Service: Cardiovascular;  Laterality: N/A;       Family History  Problem Relation Age of Onset   Hyperlipidemia Mother    Diabetes Mother    Stroke Mother     Hypertension Mother    Hypertension Father     Social History   Tobacco Use   Smoking status: Former   Smokeless tobacco: Never  Building services engineer Use: Never used  Substance Use Topics   Alcohol use: No   Drug use: No    Home Medications Prior to Admission medications   Medication Sig Start Date End Date Taking? Authorizing Provider  dicyclomine (BENTYL) 20 MG tablet Take 1 tablet (20 mg total) by mouth 2 (two) times daily. 04/09/21  Yes Sheretha Shadd, PA-C  amLODipine (NORVASC) 2.5 MG tablet Take 1 tablet (2.5 mg total) by mouth daily. 03/06/21   Laurey Morale, MD  carvedilol (COREG) 12.5 MG tablet Take 1 tablet (12.5 mg total) by mouth 2 (two) times daily with a meal. 03/19/21 03/19/22  Storm Frisk, MD  isosorbide-hydrALAZINE (BIDIL) 20-37.5 MG tablet TAKE 2 TABLETS BY MOUTH 3 (THREE) TIMES DAILY. 03/19/21 03/19/22  Storm Frisk, MD  nitroGLYCERIN (NITROSTAT) 0.4 MG SL tablet Place 1 tablet (0.4 mg total) under the tongue every 5 (five) minutes as needed for chest pain. 08/29/20   Storm Frisk, MD  potassium chloride SA (KLOR-CON) 20 MEQ tablet Take 1 tablet (20 mEq total) by mouth 2 (two) times daily. 03/19/21 03/19/22  Storm Frisk, MD  sacubitril-valsartan (ENTRESTO) 97-103 MG TAKE 1 TABLET BY MOUTH 2 (TWO) TIMES  DAILY. 03/03/21 03/03/22  Larey Dresser, MD  spironolactone (ALDACTONE) 25 MG tablet Take 1 tablet (25 mg total) by mouth daily. 03/19/21 03/19/22  Elsie Stain, MD    Allergies    Patient has no known allergies.  Review of Systems   Review of Systems  Constitutional:  Negative for chills and fever.  Gastrointestinal:  Positive for abdominal pain and diarrhea. Negative for nausea and vomiting.  Genitourinary:  Positive for flank pain. Negative for dysuria, frequency, hematuria and testicular pain.  All other systems reviewed and are negative.  Physical Exam Updated Vital Signs BP 126/72 (BP Location: Right Arm)   Pulse 60   Temp 98.2  F (36.8 C) (Oral)   Resp 20   Ht 5\' 6"  (1.676 m)   Wt 104.3 kg   SpO2 100%   BMI 37.12 kg/m   Physical Exam Vitals and nursing note reviewed.  Constitutional:      Appearance: He is not ill-appearing or diaphoretic.  HENT:     Head: Normocephalic and atraumatic.     Mouth/Throat:     Mouth: Mucous membranes are dry.  Eyes:     Conjunctiva/sclera: Conjunctivae normal.  Cardiovascular:     Rate and Rhythm: Normal rate and regular rhythm.     Heart sounds: Normal heart sounds.  Pulmonary:     Effort: Pulmonary effort is normal.     Breath sounds: Normal breath sounds. No wheezing, rhonchi or rales.  Abdominal:     General: Bowel sounds are normal.     Palpations: Abdomen is soft.     Tenderness: There is no abdominal tenderness. There is no right CVA tenderness, left CVA tenderness, guarding or rebound.  Musculoskeletal:     Cervical back: Neck supple.  Skin:    General: Skin is warm and dry.  Neurological:     Mental Status: He is alert.    ED Results / Procedures / Treatments   Labs (all labs ordered are listed, but only abnormal results are displayed) Labs Reviewed  CBC WITH DIFFERENTIAL/PLATELET - Abnormal; Notable for the following components:      Result Value   Hemoglobin 11.4 (*)    HCT 35.7 (*)    MCV 74.2 (*)    MCH 23.7 (*)    All other components within normal limits  COMPREHENSIVE METABOLIC PANEL - Abnormal; Notable for the following components:   Creatinine, Ser 1.44 (*)    Calcium 8.4 (*)    All other components within normal limits  LIPASE, BLOOD  URINALYSIS, ROUTINE W REFLEX MICROSCOPIC    EKG None  Radiology No results found.  Procedures Procedures   Medications Ordered in ED Medications  sodium chloride 0.9 % bolus 500 mL (500 mLs Intravenous New Bag/Given 04/09/21 1410)    ED Course  I have reviewed the triage vital signs and the nursing notes.  Pertinent labs & imaging results that were available during my care of the patient  were reviewed by me and considered in my medical decision making (see chart for details).  Clinical Course as of 04/09/21 1602  Wed Apr 09, 2021  1443 Hemoglobin(!): 11.4 Stable compared to previous [MV]  1443 Creatinine(!): 1.44 Baseline for patient [MV]    Clinical Course User Index [MV] Eustaquio Maize, PA-C   MDM Rules/Calculators/A&P                           35 year old presents to the ED today with complaint  diffuse abdominal pain and diarrhea for the past 5 days.  On arrival to the ED today vitals are stable:   Vitals:   04/09/21 1430 04/09/21 1535  BP: 126/79 126/72  Pulse: (!) 58 60  Resp: 18 20  Temp:    SpO2: 100% 100%   Patient is resting comfortably in bed texting/appears to be no acute distress.  Abdomen is soft and nontender.  He has no CVA tenderness palpation.  He denies any urinary symptoms.  Slightly dry mucous membranes.  Plan for abd screening labs.  Will provide fluids for dehydration. Will continue to monitor. Suspect viral gastroenteritis. Low suspicion for acute surgical abdomen. His daughter had similar symptoms after eating the same salad. No recent abx use to suggest C diff.   Labs unremarkable at this time. U/A negative for infection or blood to suggest kidney stone. On reeval pt resting comfortably. Will discharge home at this time with PCP follow up. Have discussed BLAND Diet over the next few days as I suspect sx related to viral gastroenteritis. Will discharge with bentyl. Pt in agreement with plan and stable for discharge.   This note was prepared using Dragon voice recognition software and may include unintentional dictation errors due to the inherent limitations of voice recognition software.  Final Clinical Impression(s) / ED Diagnoses Final diagnoses:  Generalized abdominal pain  Diarrhea, unspecified type  Viral gastroenteritis    Rx / DC Orders ED Discharge Orders          Ordered    dicyclomine (BENTYL) 20 MG tablet  2 times daily         04/09/21 1601             Discharge Instructions      Please pick up medication and take as prescribed  Continue to push fluids to stay hydrated. I would also recommend a BLAND diet over the next few days (see attached).   Follow up with your PCP for further evaluation  Return to the ED for any new/worsening symptoms       Eustaquio Maize, Derek Reed 04/09/21 1602    Luna Fuse, MD 04/16/21 [phone]

## 2021-04-09 NOTE — ED Triage Notes (Signed)
Pt c/o abd pain, diarrhea sx started 6 days ago-no diarrhea the last 1-2 days-NAD-steady gait

## 2021-04-11 ENCOUNTER — Other Ambulatory Visit: Payer: Self-pay

## 2021-04-14 ENCOUNTER — Other Ambulatory Visit: Payer: Self-pay

## 2021-04-21 ENCOUNTER — Other Ambulatory Visit: Payer: Self-pay

## 2021-04-24 ENCOUNTER — Ambulatory Visit: Payer: BC Managed Care – PPO | Admitting: Critical Care Medicine

## 2021-04-24 NOTE — Progress Notes (Incomplete)
Established Patient Office Visit  Subjective:  Patient ID: Derek Reed, male    DOB: [birth year]  Age: 35 y.o. MRN: [id number]  CC: No chief complaint on file.   HPI Ev[place] Ess presents for ***  Past Medical History:  Diagnosis Date   Acute CHF (congestive heart failure) ([place]) 02/24/2019   Anxiety    Hypertension    Obesity    Peritonsillar abscess     Past Surgical History:  Procedure Laterality Date   INCISION AND DRAINAGE OF PERITONSILLAR ABCESS     RIGHT/LEFT HEART CATH AND CORONARY ANGIOGRAPHY N/A 02/24/2019   Procedure: RIGHT/LEFT HEART CATH AND CORONARY ANGIOGRAPHY;  Surgeon: Larey Dresser, MD;  Location: [facility];  Service: Cardiovascular;  Laterality: N/A;    Family History  Problem Relation Age of Onset   Hyperlipidemia Mother    Diabetes Mother    Stroke Mother    Hypertension Mother    Hypertension Father     Social History   Socioeconomic History   Marital status: Single    Spouse name: Not on file   Number of children: Not on file   Years of education: Not on file   Highest education level: Not on file  Occupational History   Not on file  Tobacco Use   Smoking status: Former   Smokeless tobacco: Never  Vaping Use   Vaping Use: Never used  Substance and Sexual Activity   Alcohol use: No   Drug use: No   Sexual activity: Not on file  Other Topics Concern   Not on file  Social History Narrative   Not on file   Social Determinants of Health   Financial Resource Strain: Not on file  Food Insecurity: Not on file  Transportation Needs: Not on file  Physical Activity: Not on file  Stress: Not on file  Social Connections: Not on file  Intimate Partner Violence: Not on file    Outpatient Medications Prior to Visit  Medication Sig Dispense Refill   amLODipine (NORVASC) 2.5 MG tablet Take 1 tablet (2.5 mg total) by mouth daily. 30 tablet 6   carvedilol (COREG) 12.5 MG tablet Take 1 tablet (12.5  mg total) by mouth 2 (two) times daily with a meal. 60 tablet 3   dicyclomine (BENTYL) 20 MG tablet Take 1 tablet (20 mg total) by mouth 2 (two) times daily. 20 tablet 0   isosorbide-hydrALAZINE (BIDIL) 20-37.5 MG tablet TAKE 2 TABLETS BY MOUTH 3 (THREE) TIMES DAILY. 540 tablet 3   nitroGLYCERIN (NITROSTAT) 0.4 MG SL tablet Place 1 tablet (0.4 mg total) under the tongue every 5 (five) minutes as needed for chest pain. 30 tablet 3   potassium chloride SA (KLOR-CON M) 20 MEQ tablet Take 1 tablet (20 mEq total) by mouth 2 (two) times daily. 60 tablet 3   sacubitril-valsartan (ENTRESTO) 97-103 MG TAKE 1 TABLET BY MOUTH 2 (TWO) TIMES DAILY. 180 tablet 3   spironolactone (ALDACTONE) 25 MG tablet Take 1 tablet (25 mg total) by mouth daily. 90 tablet 1   No facility-administered medications prior to visit.    No Known Allergies  ROS Review of Systems    Objective:    Physical Exam  There were no vitals taken for this visit. Wt Readings from Last 3 Encounters:  04/09/21 230 lb (104.3 kg)  03/19/21 236 lb 6.4 oz (107.2 kg)  03/06/21 236 lb 3.2 oz (107.1 kg)     Health Maintenance Due  Topic Date Due   Pneumococcal  Vaccine 48-16 Years old (1 - PCV) Never done    There are no preventive care reminders to display for this patient.  No results found for: TSH Lab Results  Component Value Date   WBC 5.9 04/09/2021   HGB 11.4 (L) 04/09/2021   HCT 35.7 (L) 04/09/2021   MCV 74.2 (L) 04/09/2021   PLT 270 04/09/2021   Lab Results  Component Value Date   NA 138 04/09/2021   K 3.9 04/09/2021   CO2 22 04/09/2021   GLUCOSE 94 04/09/2021   BUN 17 04/09/2021   CREATININE 1.44 (H) 04/09/2021   BILITOT 0.3 04/09/2021   ALKPHOS 72 04/09/2021   AST 18 04/09/2021   ALT 15 04/09/2021   PROT 6.8 04/09/2021   ALBUMIN 3.6 04/09/2021   CALCIUM 8.4 (L) 04/09/2021   ANIONGAP 7 04/09/2021   EGFR 54 (L) 01/01/2021   Lab Results  Component Value Date   CHOL 183 02/24/2019   Lab  Results  Component Value Date   HDL 31 (L) 02/24/2019   Lab Results  Component Value Date   LDLCALC 139 (H) 02/24/2019   Lab Results  Component Value Date   TRIG 65 02/24/2019   Lab Results  Component Value Date   CHOLHDL 5.9 02/24/2019   Lab Results  Component Value Date   HGBA1C 6.2 (H) 02/24/2019      Assessment & Plan:   Problem List Items Addressed This Visit   None   No orders of the defined types were placed in this encounter.   Follow-up: No follow-ups on file.    Asencion Noble, MD

## 2021-05-07 ENCOUNTER — Other Ambulatory Visit: Payer: Self-pay

## 2021-05-19 ENCOUNTER — Other Ambulatory Visit: Payer: Self-pay

## 2021-05-23 ENCOUNTER — Other Ambulatory Visit: Payer: Self-pay

## 2021-05-26 ENCOUNTER — Other Ambulatory Visit: Payer: Self-pay

## 2021-06-10 ENCOUNTER — Other Ambulatory Visit: Payer: Self-pay

## 2021-06-25 ENCOUNTER — Other Ambulatory Visit: Payer: Self-pay

## 2021-06-26 ENCOUNTER — Other Ambulatory Visit: Payer: Self-pay

## 2021-07-04 ENCOUNTER — Other Ambulatory Visit: Payer: Self-pay

## 2021-07-04 ENCOUNTER — Other Ambulatory Visit (HOSPITAL_COMMUNITY): Payer: Self-pay

## 2021-07-10 ENCOUNTER — Other Ambulatory Visit (HOSPITAL_COMMUNITY): Payer: Self-pay

## 2021-07-10 ENCOUNTER — Other Ambulatory Visit: Payer: Self-pay

## 2021-07-11 ENCOUNTER — Other Ambulatory Visit (HOSPITAL_COMMUNITY): Payer: Self-pay

## 2021-07-14 ENCOUNTER — Encounter (HOSPITAL_BASED_OUTPATIENT_CLINIC_OR_DEPARTMENT_OTHER): Payer: Self-pay | Admitting: *Deleted

## 2021-07-14 ENCOUNTER — Other Ambulatory Visit (HOSPITAL_COMMUNITY): Payer: Self-pay

## 2021-07-14 ENCOUNTER — Other Ambulatory Visit: Payer: Self-pay

## 2021-07-14 ENCOUNTER — Emergency Department (HOSPITAL_BASED_OUTPATIENT_CLINIC_OR_DEPARTMENT_OTHER)
Admission: EM | Admit: 2021-07-14 | Discharge: 2021-07-14 | Disposition: A | Discharge status: Home / Self Care | Payer: BC Managed Care – PPO | Attending: Emergency Medicine | Admitting: Emergency Medicine

## 2021-07-14 DIAGNOSIS — N189 Chronic kidney disease, unspecified: Secondary | ICD-10-CM | POA: Diagnosis not present

## 2021-07-14 DIAGNOSIS — R1013 Epigastric pain: Secondary | ICD-10-CM | POA: Insufficient documentation

## 2021-07-14 DIAGNOSIS — I509 Heart failure, unspecified: Secondary | ICD-10-CM | POA: Insufficient documentation

## 2021-07-14 DIAGNOSIS — R109 Unspecified abdominal pain: Secondary | ICD-10-CM | POA: Diagnosis not present

## 2021-07-14 DIAGNOSIS — Z79899 Other long term (current) drug therapy: Secondary | ICD-10-CM | POA: Diagnosis not present

## 2021-07-14 DIAGNOSIS — I130 Hypertensive heart and chronic kidney disease with heart failure and stage 1 through stage 4 chronic kidney disease, or unspecified chronic kidney disease: Secondary | ICD-10-CM | POA: Diagnosis not present

## 2021-07-14 LAB — URINALYSIS, ROUTINE W REFLEX MICROSCOPIC
Bilirubin Urine: NEGATIVE
Glucose, UA: NEGATIVE mg/dL
Hgb urine dipstick: NEGATIVE
Ketones, ur: NEGATIVE mg/dL
Leukocytes,Ua: NEGATIVE
Nitrite: NEGATIVE
Protein, ur: NEGATIVE mg/dL
Specific Gravity, Urine: >1.03 — ABNORMAL HIGH (ref 1.005–1.030)
pH: 5.5 (ref 5.0–8.0)

## 2021-07-14 LAB — COMPREHENSIVE METABOLIC PANEL
ALT: 13 U/L (ref 0–44)
AST: 18 U/L (ref 15–41)
Albumin: 3.6 g/dL (ref 3.5–5.0)
Alkaline Phosphatase: 69 U/L (ref 38–126)
Anion gap: 9 (ref 5–15)
BUN: 11 mg/dL (ref 6–20)
CO2: 21 mmol/L — ABNORMAL LOW (ref 22–32)
Calcium: 8.3 mg/dL — ABNORMAL LOW (ref 8.9–10.3)
Chloride: 108 mmol/L (ref 98–111)
Creatinine, Ser: 1.39 mg/dL — ABNORMAL HIGH (ref 0.61–1.24)
GFR, Estimated: >60 mL/min (ref >60)
Glucose, Bld: 138 mg/dL — ABNORMAL HIGH (ref 70–99)
Potassium: 3.7 mmol/L (ref 3.5–5.1)
Sodium: 138 mmol/L (ref 135–145)
Total Bilirubin: 0.4 mg/dL (ref 0.3–1.2)
Total Protein: 7.3 g/dL (ref 6.5–8.1)

## 2021-07-14 LAB — CBC
HCT: 37.6 % — ABNORMAL LOW (ref 39.0–52.0)
Hemoglobin: 12.1 g/dL — ABNORMAL LOW (ref 13.0–17.0)
MCH: 23.3 pg — ABNORMAL LOW (ref 26.0–34.0)
MCHC: 32.2 g/dL (ref 30.0–36.0)
MCV: 72.3 fL — ABNORMAL LOW (ref 80.0–100.0)
Platelets: 277 10*3/uL (ref 150–400)
RBC: 5.2 MIL/uL (ref 4.22–5.81)
RDW: 16 % — ABNORMAL HIGH (ref 11.5–15.5)
WBC: 8.6 10*3/uL (ref 4.0–10.5)
nRBC: 0 % (ref 0.0–0.2)

## 2021-07-14 LAB — LIPASE, BLOOD: Lipase: 33 U/L (ref 11–51)

## 2021-07-14 MED ORDER — OMEPRAZOLE 20 MG PO CPDR: 20.0000 mg | DELAYED_RELEASE_CAPSULE | Freq: Every day | ORAL | 0 refills | Status: DC

## 2021-07-14 NOTE — Discharge Instructions (Signed)
I am prescribing you a 1 month supply of the medication called omeprazole.  This medication helps reduce stomach acid and will hopefully help improve your intermittent stomach pain.  Please take this once per day as prescribed. ? ?Please follow-up with your regular doctor regarding your symptoms.  If you develop any new or worsening symptoms please come back to the emergency department immediately for reevaluation. ?

## 2021-07-14 NOTE — ED Notes (Signed)
Pt reports intermittent Upper mid abd pain for several weeks. States pain is unrelieved at home, feels better when lying on side. Denies n/v/d. No tenderness present.  ?

## 2021-07-14 NOTE — ED Triage Notes (Signed)
Mid abdominal pain on and off x 2 weeks.  ?

## 2021-07-14 NOTE — ED Provider Notes (Signed)
[facility] EMERGENCY DEPARTMENT Provider Note   CSN: [id number] Arrival date & time: 07/14/21  1627     History  Chief Complaint  Patient presents with   Abdominal Pain    [name] is a 36 y.o. male.  HPI Patient is a 36 year old male with a history of hypertension, CKD, CHF, GAD, who presents to the emergency department due to intermittent abdominal pain.  Patient states that he has been experiencing mid abdominal pain for the past 2 weeks.  It is happening about 2-3 times per week and will last 2 to 3 hours before spontaneously resolving.  States that he has noticed that it will sometimes begin and worsen with certain foods such as when having smoothies.  Otherwise denies any modifying factors.  Denies any chest pain, shortness of breath, nausea, vomiting, diarrhea, dysuria.    Home Medications Prior to Admission medications   Medication Sig Start Date End Date Taking? Authorizing Provider  omeprazole (PRILOSEC) 20 MG capsule Take 1 capsule (20 mg total) by mouth daily. 07/14/21  Yes Placido Sou, PA-C  amLODipine (NORVASC) 2.5 MG tablet Take 1 tablet (2.5 mg total) by mouth daily. 03/06/21   Laurey Morale, MD  carvedilol (COREG) 12.5 MG tablet Take 1 tablet (12.5 mg total) by mouth 2 (two) times daily with a meal. 03/19/21 03/19/22  Storm Frisk, MD  dicyclomine (BENTYL) 20 MG tablet Take 1 tablet (20 mg total) by mouth 2 (two) times daily. 04/09/21   Venter, Margaux, PA-C  isosorbide-hydrALAZINE (BIDIL) 20-37.5 MG tablet TAKE 2 TABLETS BY MOUTH 3 (THREE) TIMES DAILY. 03/19/21 03/19/22  Storm Frisk, MD  nitroGLYCERIN (NITROSTAT) 0.4 MG SL tablet Place 1 tablet (0.4 mg total) under the tongue every 5 (five) minutes as needed for chest pain. 08/29/20   Storm Frisk, MD  potassium chloride SA (KLOR-CON M) 20 MEQ tablet Take 1 tablet (20 mEq total) by mouth 2 (two) times daily. 03/19/21 03/19/22  Storm Frisk, MD  sacubitril-valsartan (ENTRESTO) 97-103  MG TAKE 1 TABLET BY MOUTH 2 (TWO) TIMES DAILY. 03/03/21 03/03/22  Laurey Morale, MD  spironolactone (ALDACTONE) 25 MG tablet Take 1 tablet (25 mg total) by mouth daily. 03/19/21 03/19/22  Storm Frisk, MD      Allergies    Patient has no known allergies.    Review of Systems   Review of Systems  All other systems reviewed and are negative. Ten systems reviewed and are negative for acute change, except as noted in the HPI.   Physical Exam Updated Vital Signs BP 122/82 (BP Location: Right Arm)    Pulse 69    Temp 98.1 F (36.7 C) (Oral)    Resp 16    Ht 5\' 6"  (1.676 m)    Wt 113 kg    SpO2 97%    BMI 40.21 kg/m  Physical Exam Vitals and nursing note reviewed.  Constitutional:      General: He is not in acute distress.    Appearance: Normal appearance. He is not ill-appearing, toxic-appearing or diaphoretic.  HENT:     Head: Normocephalic and atraumatic.     Right Ear: External ear normal.     Left Ear: External ear normal.     Nose: Nose normal.     Mouth/Throat:     Mouth: Mucous membranes are moist.     Pharynx: Oropharynx is clear. No oropharyngeal exudate or posterior oropharyngeal erythema.  Eyes:     Extraocular Movements: Extraocular movements intact.  Cardiovascular:     Rate and Rhythm: Normal rate and regular rhythm.     Pulses: Normal pulses.     Heart sounds: Normal heart sounds. No murmur heard.   No friction rub. No gallop.  Pulmonary:     Effort: Pulmonary effort is normal. No respiratory distress.     Breath sounds: Normal breath sounds. No stridor. No wheezing, rhonchi or rales.  Abdominal:     General: Abdomen is flat.     Palpations: Abdomen is soft.     Tenderness: There is abdominal tenderness in the epigastric area.     Comments: Protuberant abdomen that is soft.  Mild tenderness appreciated overlying the epigastrium.  Negative Murphy sign.  No McBurney's point tenderness.  Musculoskeletal:        General: Normal range of motion.     Cervical  back: Normal range of motion and neck supple. No tenderness.  Skin:    General: Skin is warm and dry.  Neurological:     General: No focal deficit present.     Mental Status: He is alert and oriented to person, place, and time.  Psychiatric:        Mood and Affect: Mood normal.        Behavior: Behavior normal.   ED Results / Procedures / Treatments   Labs (all labs ordered are listed, but only abnormal results are displayed) Labs Reviewed  COMPREHENSIVE METABOLIC PANEL - Abnormal; Notable for the following components:      Result Value   CO2 21 (*)    Glucose, Bld 138 (*)    Creatinine, Ser 1.39 (*)    Calcium 8.3 (*)    All other components within normal limits  CBC - Abnormal; Notable for the following components:   Hemoglobin 12.1 (*)    HCT 37.6 (*)    MCV 72.3 (*)    MCH 23.3 (*)    RDW 16.0 (*)    All other components within normal limits  URINALYSIS, ROUTINE W REFLEX MICROSCOPIC - Abnormal; Notable for the following components:   Specific Gravity, Urine >1.030 (*)    All other components within normal limits  LIPASE, BLOOD   EKG None  Radiology No results found.  Procedures Procedures   Medications Ordered in ED Medications - No data to display  ED Course/ Medical Decision Making/ A&P                           Medical Decision Making Amount and/or Complexity of Data Reviewed Labs: ordered.  Pt is a 36 y.o. male who presents to the emergency department due to intermittent upper abdominal pain.  Symptoms started about 2 weeks ago and have been occurring 2-3 times per week and lasting about 2 to 3 hours with each occurrence.  Notes that they sometimes worsen with certain foods but otherwise denies any modifying factors.  No URI symptoms, chest pain, shortness of breath, nausea, vomiting, diarrhea, urinary complaints.  Labs: CBC with a hemoglobin of 12.1, hematocrit of 37.6, MCV of 72.3, MCH of 23.3, RDW of 16. CMP with a CO2 of 21, glucose of 138,  creatinine of 1.39, calcium of 8.3. Lipase of 33. UA with an elevated specific gravity but otherwise within normal limits.  I, Placido Sou, PA-C, personally reviewed and evaluated these images and lab results as part of my medical decision-making.  Patient's physical exam appears generally reassuring.  Abdomen is soft.  He does have mild  tenderness overlying the epigastrium but has a negative Murphy sign and no McBurney's point tenderness.  Symmetric DP pulses.  Does not appear to be in significant pain at this time.  Does not appear consistent with AAA or dissection at this time.  Patient afebrile, nontachycardic, and nontoxic-appearing.  CBC without leukocytosis.  Doubt infectious etiology at this time.  Lab work generally reassuring.  Creatinine at 1.39 which appears stable.  Hemoglobin of 12.1 which also appears stable.  Hypocalcemia of 8.3 but otherwise no electrolyte derangements.  Patient denies any complaints besides abdominal pain.  No chest pain, shortness of breath, nausea, vomiting, fevers, URI symptoms, urinary symptoms.  Patient's symptoms appear consistent with gastritis.  He does note that they will at times worsen when eating certain foods.  Will discharge on a short course of omeprazole to see if this improves his symptoms.  Did not feel that CT imaging was warranted at this visit and he is agreeable.  Recommended PCP follow-up.  Given very strict return precautions and he knows to return to the emergency department with any new or worsening symptoms.  Feel that he is stable for discharge at this time and he is agreeable.  His questions were answered and he was amicable at the time of discharge.  Note: Portions of this report may have been transcribed using voice recognition software. Every effort was made to ensure accuracy; however, inadvertent computerized transcription errors may be present.   Final Clinical Impression(s) / ED Diagnoses Final diagnoses:  Epigastric pain    Rx / DC Orders ED Discharge Orders          Ordered    omeprazole (PRILOSEC) 20 MG capsule  Daily        07/14/21 1826              Placido Sou, PA-C 07/14/21 1907    Rozelle Christen Wardrop, DO 07/14/21 2308

## 2021-07-17 ENCOUNTER — Encounter: Payer: Self-pay | Admitting: Critical Care Medicine

## 2021-07-28 ENCOUNTER — Other Ambulatory Visit: Payer: Self-pay

## 2021-08-11 ENCOUNTER — Other Ambulatory Visit (HOSPITAL_COMMUNITY): Payer: Self-pay

## 2021-08-12 ENCOUNTER — Emergency Department (HOSPITAL_BASED_OUTPATIENT_CLINIC_OR_DEPARTMENT_OTHER)
Admission: EM | Admit: 2021-08-12 | Discharge: 2021-08-12 | Disposition: A | Discharge status: Home / Self Care | Payer: BC Managed Care – PPO | Attending: Emergency Medicine | Admitting: Emergency Medicine

## 2021-08-12 ENCOUNTER — Emergency Department (HOSPITAL_BASED_OUTPATIENT_CLINIC_OR_DEPARTMENT_OTHER): Payer: BC Managed Care – PPO

## 2021-08-12 ENCOUNTER — Other Ambulatory Visit: Payer: Self-pay

## 2021-08-12 DIAGNOSIS — I130 Hypertensive heart and chronic kidney disease with heart failure and stage 1 through stage 4 chronic kidney disease, or unspecified chronic kidney disease: Secondary | ICD-10-CM | POA: Diagnosis not present

## 2021-08-12 DIAGNOSIS — R7989 Other specified abnormal findings of blood chemistry: Secondary | ICD-10-CM | POA: Insufficient documentation

## 2021-08-12 DIAGNOSIS — I5022 Chronic systolic (congestive) heart failure: Secondary | ICD-10-CM | POA: Diagnosis not present

## 2021-08-12 DIAGNOSIS — R072 Precordial pain: Secondary | ICD-10-CM | POA: Diagnosis not present

## 2021-08-12 DIAGNOSIS — N182 Chronic kidney disease, stage 2 (mild): Secondary | ICD-10-CM | POA: Diagnosis not present

## 2021-08-12 DIAGNOSIS — R0602 Shortness of breath: Secondary | ICD-10-CM | POA: Insufficient documentation

## 2021-08-12 DIAGNOSIS — I517 Cardiomegaly: Secondary | ICD-10-CM | POA: Diagnosis not present

## 2021-08-12 DIAGNOSIS — R079 Chest pain, unspecified: Secondary | ICD-10-CM | POA: Diagnosis not present

## 2021-08-12 DIAGNOSIS — Z79899 Other long term (current) drug therapy: Secondary | ICD-10-CM | POA: Insufficient documentation

## 2021-08-12 LAB — HEPATIC FUNCTION PANEL
ALT: 15 U/L (ref 0–44)
AST: 17 U/L (ref 15–41)
Albumin: 3.6 g/dL (ref 3.5–5.0)
Alkaline Phosphatase: 70 U/L (ref 38–126)
Bilirubin, Direct: <0.1 mg/dL (ref 0.0–0.2)
Total Bilirubin: 0.4 mg/dL (ref 0.3–1.2)
Total Protein: 7.2 g/dL (ref 6.5–8.1)

## 2021-08-12 LAB — BASIC METABOLIC PANEL
Anion gap: 7 (ref 5–15)
BUN: 15 mg/dL (ref 6–20)
CO2: 23 mmol/L (ref 22–32)
Calcium: 8.4 mg/dL — ABNORMAL LOW (ref 8.9–10.3)
Chloride: 107 mmol/L (ref 98–111)
Creatinine, Ser: 1.33 mg/dL — ABNORMAL HIGH (ref 0.61–1.24)
GFR, Estimated: >60 mL/min (ref >60)
Glucose, Bld: 115 mg/dL — ABNORMAL HIGH (ref 70–99)
Potassium: 3.8 mmol/L (ref 3.5–5.1)
Sodium: 137 mmol/L (ref 135–145)

## 2021-08-12 LAB — CBC WITH DIFFERENTIAL/PLATELET
Abs Immature Granulocytes: 0.03 10*3/uL (ref 0.00–0.07)
Basophils Absolute: 0 10*3/uL (ref 0.0–0.1)
Basophils Relative: 0 %
Eosinophils Absolute: 0.2 10*3/uL (ref 0.0–0.5)
Eosinophils Relative: 3 %
HCT: 36.2 % — ABNORMAL LOW (ref 39.0–52.0)
Hemoglobin: 11.6 g/dL — ABNORMAL LOW (ref 13.0–17.0)
Immature Granulocytes: 0 %
Lymphocytes Relative: 47 %
Lymphs Abs: 3.2 10*3/uL (ref 0.7–4.0)
MCH: 23.3 pg — ABNORMAL LOW (ref 26.0–34.0)
MCHC: 32 g/dL (ref 30.0–36.0)
MCV: 72.7 fL — ABNORMAL LOW (ref 80.0–100.0)
Monocytes Absolute: 0.7 10*3/uL (ref 0.1–1.0)
Monocytes Relative: 10 %
Neutro Abs: 2.8 10*3/uL (ref 1.7–7.7)
Neutrophils Relative %: 40 %
Platelets: 278 10*3/uL (ref 150–400)
RBC: 4.98 MIL/uL (ref 4.22–5.81)
RDW: 16.3 % — ABNORMAL HIGH (ref 11.5–15.5)
WBC: 7 10*3/uL (ref 4.0–10.5)
nRBC: 0 % (ref 0.0–0.2)

## 2021-08-12 LAB — BRAIN NATRIURETIC PEPTIDE: B Natriuretic Peptide: 43.7 pg/mL (ref 0.0–100.0)

## 2021-08-12 LAB — TROPONIN I (HIGH SENSITIVITY)
Troponin I (High Sensitivity): 10 ng/L (ref <18)
Troponin I (High Sensitivity): 10 ng/L (ref <18)

## 2021-08-12 MED ORDER — IOHEXOL 350 MG/ML SOLN
100.0000 mL | Freq: Once | INTRAVENOUS | Status: AC | PRN
  Administered 2021-08-12: 100 mL via INTRAVENOUS

## 2021-08-12 NOTE — Discharge Instructions (Signed)
Please follow-up with your cardiologist.  Come back to ER if you develop worsening chest pain, difficulty breathing or other new concerning symptom. ?

## 2021-08-12 NOTE — ED Provider Notes (Signed)
Signout note ? ?36 year old male presents to ER with concern for chest pain.  Notable history of CKD, hypertension, heart failure.  EKG with slightly worsened ST segment depression in lead III but everything else appears unchanged.  Initial troponin normal.  Plan to check PE study and repeat troponin.  Patient now symptom-free.  If repeat troponin normal and CT negative for PE, then plan to discharge patient and recommend outpatient follow-up. ? ?3:30 PM received signout from [facility] ? ?5:09 PM reviewed patient's results, CT negative, repeat troponin normal, no delta troponin, reassessed patient and updated on results.  He denies any ongoing symptoms at this time.  Recommend he follow-up with his cardiologist, reviewed return precautions in detail with patient and discharged home. ?  ?Lucrezia Starch, MD ?08/12/21 1710 ? ?

## 2021-08-12 NOTE — ED Notes (Signed)
Late entry-- Pt ambulatory to xray escorted by xray tech -- now back in room; cardiac monitor resumed - no acute changes  ?

## 2021-08-12 NOTE — ED Notes (Signed)
Patient ambulatory to Xray.

## 2021-08-12 NOTE — ED Notes (Signed)
Patient transported to CT 

## 2021-08-12 NOTE — ED Triage Notes (Signed)
Pt arrives via POV with c/o sudden onset nonradiating L side cp with associated sob that began approx 1 hr described as heaviness; pt stated he was sitting at time onset -- pt also c/o feeling "tired" - states "I feel like I'm dehydrated even though I've been drinking water"  - pt reports similar cp episodes in the past related to elevated bp.   ?

## 2021-08-12 NOTE — ED Provider Notes (Addendum)
?[facility] EMERGENCY DEPARTMENT ?Provider Note ? ? ?CSN: [id number] ?Arrival date & time: 08/12/21  1412 ? ?  ? ?History ? ?Chief Complaint  ?Patient presents with  ? Chest Pain  ? ? ?Derek Reed is a 36 y.o. male. ? ?Patient with the cute onset of left anterior chest pain an hour ago while at work.  Associated with some mild shortness of breath no nausea vomiting.  Pain is nonradiating.  Patient is followed by cardiology for hypertension chronic kidney disease stage II chronic systolic heart failure secondary to the hypertension.  Patient denies any leg swelling.  Oxygen saturation here is 100% in the room it was 99%. ? ? ?  ? ?Home Medications ?Prior to Admission medications   ?Medication Sig Start Date End Date Taking? Authorizing Provider  ?amLODipine (NORVASC) 2.5 MG tablet Take 1 tablet (2.5 mg total) by mouth daily. 03/06/21   Larey Dresser, MD  ?carvedilol (COREG) 12.5 MG tablet Take 1 tablet (12.5 mg total) by mouth 2 (two) times daily with a meal. 03/19/21 03/19/22  Elsie Stain, MD  ?dicyclomine (BENTYL) 20 MG tablet Take 1 tablet (20 mg total) by mouth 2 (two) times daily. 04/09/21   Venter, Margaux, PA-C  ?isosorbide-hydrALAZINE (BIDIL) 20-37.5 MG tablet TAKE 2 TABLETS BY MOUTH 3 (THREE) TIMES DAILY. 03/19/21 03/19/22  Elsie Stain, MD  ?nitroGLYCERIN (NITROSTAT) 0.4 MG SL tablet Place 1 tablet (0.4 mg total) under the tongue every 5 (five) minutes as needed for chest pain. 08/29/20   Elsie Stain, MD  ?omeprazole (PRILOSEC) 20 MG capsule Take 1 capsule (20 mg total) by mouth daily. 07/14/21   Rayna Sexton, PA-C  ?potassium chloride SA (KLOR-CON M) 20 MEQ tablet Take 1 tablet (20 mEq total) by mouth 2 (two) times daily. 03/19/21 03/19/22  Elsie Stain, MD  ?sacubitril-valsartan (ENTRESTO) 97-103 MG TAKE 1 TABLET BY MOUTH 2 (TWO) TIMES DAILY. 03/03/21 03/03/22  Larey Dresser, MD  ?spironolactone (ALDACTONE) 25 MG tablet Take 1 tablet (25 mg total) by mouth daily. 03/19/21  03/19/22  Elsie Stain, MD  ?   ? ?Allergies    ?Patient has no known allergies.   ? ?Review of Systems   ?Review of Systems  ?Constitutional:  Negative for chills and fever.  ?HENT:  Negative for ear pain and sore throat.   ?Eyes:  Negative for pain and visual disturbance.  ?Respiratory:  Positive for shortness of breath. Negative for cough.   ?Cardiovascular:  Positive for chest pain. Negative for palpitations and leg swelling.  ?Gastrointestinal:  Negative for abdominal pain and vomiting.  ?Genitourinary:  Negative for dysuria and hematuria.  ?Musculoskeletal:  Negative for arthralgias and back pain.  ?Skin:  Negative for color change and rash.  ?Neurological:  Negative for seizures and syncope.  ?All other systems reviewed and are negative. ? ?Physical Exam ?Updated Vital Signs ?BP 127/81   Pulse 62   Temp 97.8 ?F (36.6 ?C) (Oral)   Resp 16   Ht 1.676 m (5\' 6" )   Wt 106.6 kg   SpO2 99%   BMI 37.93 kg/m?  ?Physical Exam ?Vitals and nursing note reviewed.  ?Constitutional:   ?   General: He is not in acute distress. ?   Appearance: Normal appearance. He is well-developed.  ?HENT:  ?   Head: Normocephalic and atraumatic.  ?Eyes:  ?   Extraocular Movements: Extraocular movements intact.  ?   Conjunctiva/sclera: Conjunctivae normal.  ?   Pupils: Pupils are equal, round,  and reactive to light.  ?Cardiovascular:  ?   Rate and Rhythm: Normal rate and regular rhythm.  ?   Heart sounds: No murmur heard. ?Pulmonary:  ?   Effort: Pulmonary effort is normal. No respiratory distress.  ?   Breath sounds: Normal breath sounds. No wheezing, rhonchi or rales.  ?Chest:  ?   Chest wall: No tenderness.  ?Abdominal:  ?   Palpations: Abdomen is soft.  ?   Tenderness: There is no abdominal tenderness.  ?Musculoskeletal:     ?   General: No swelling.  ?   Cervical back: Neck supple.  ?   Right lower leg: No edema.  ?   Left lower leg: No edema.  ?Skin: ?   General: Skin is warm and dry.  ?   Capillary Refill: Capillary refill  takes less than 2 seconds.  ?Neurological:  ?   General: No focal deficit present.  ?   Mental Status: He is alert and oriented to person, place, and time.  ?Psychiatric:     ?   Mood and Affect: Mood normal.  ? ? ?ED Results / Procedures / Treatments   ?Labs ?(all labs ordered are listed, but only abnormal results are displayed) ?Labs Reviewed  ?CBC WITH DIFFERENTIAL/PLATELET - Abnormal; Notable for the following components:  ?    Result Value  ? Hemoglobin 11.6 (*)   ? HCT 36.2 (*)   ? MCV 72.7 (*)   ? MCH 23.3 (*)   ? RDW 16.3 (*)   ? All other components within normal limits  ?BASIC METABOLIC PANEL  ?CBC  ?BRAIN NATRIURETIC PEPTIDE  ?HEPATIC FUNCTION PANEL  ?TROPONIN I (HIGH SENSITIVITY)  ? ? ?EKG ?EKG Interpretation ? ?Date/Time:  Tuesday August 12 2021 14:19:43 EDT ?Ventricular Rate:  66 ?PR Interval:  193 ?QRS Duration: 85 ?QT Interval:  398 ?QTC Calculation: 417 ?R Axis:   -8 ?Text Interpretation: Sinus rhythm Probable left ventricular hypertrophy Borderline T abnormalities, inferior leads Borderline ST elevation, lateral leads Slightly worsening ST segment depression inferiorly than before.  But not completely brand-new. Confirmed by Fredia Sorrow [phone]) on 08/12/2021 2:29:01 PM ? ?Radiology ?DG Chest 2 View ? ?Result Date: 08/12/2021 ?CLINICAL DATA:  sob, chp EXAM: CHEST - 2 VIEW COMPARISON:  Radiograph 08/25/2018 FINDINGS: Unchanged mildly enlarged cardiac silhouette. There is no focal airspace consolidation. There is no pleural effusion. No pneumothorax. There is no acute osseous abnormality. IMPRESSION: Unchanged cardiomegaly.  No focal airspace consolidation. Electronically Signed   By: Maurine Simmering M.D.   On: 08/12/2021 14:46   ? ?Procedures ?Procedures  ? ? ?Medications Ordered in ED ?Medications - No data to display ? ?ED Course/ Medical Decision Making/ A&P ?  ?                        ?Medical Decision Making ?Amount and/or Complexity of Data Reviewed ?Labs: ordered. ?Radiology: ordered. ? ? ?EKG  does show some slightly worsening ST segment depression inferiorly.  With a general pattern is similar to from previous.  Patient with sudden onset of chest pain an hour ago. ? ?Oxygen saturations are good no leg swelling.  But patient does have a history of hypertension history of chronic kidney disease.  And a history of chronic systolic heart failure.  Clinically lungs are clear no evidence of any heart failure on exam. ? ?CBC no leukocytosis hemoglobin 11.6.  Basic metabolic panel is pending troponins are pending BNP is pending chest x-ray 2  view is pending. ? ?Patient's relatively young.  That we will at least need delta troponins.  No chest tenderness on palpation to suggest chest wall etiology.  Clinically not tachycardic no leg swelling.  But pulmonary embolus not completely ruled out. ? ?Chest x-ray without any acute findings.  Cardiomegaly but no thing consistent with congestive heart failure. ? ?Since initial troponin is 10 will need a delta troponin.  Patient's basic metabolic panel is normal.  Creatinine is up at 1.33 but GFR is greater than 60.  Already mentioned the CBC results.  Hepatic function without any abnormalities. ? ?Patient will need delta troponin.  And patient will get CT angio to rule out pulmonary embolus. ? ? ?Final Clinical Impression(s) / ED Diagnoses ?Final diagnoses:  ?Precordial pain  ? ? ?Rx / DC Orders ?ED Discharge Orders   ? ? None  ? ?  ? ? ?  ?Fredia Sorrow, MD ?08/12/21 1450 ? ?  ?Fredia Sorrow, MD ?08/12/21 1450 ? ?  ?Fredia Sorrow, MD ?08/12/21 1507 ? ?  ?Fredia Sorrow, MD ?08/12/21 1525 ? ?

## 2021-08-12 NOTE — ED Notes (Signed)
Pt now oob to hall bathroom; ambulatory independently with steady gait.   ?

## 2021-08-13 ENCOUNTER — Other Ambulatory Visit (HOSPITAL_COMMUNITY): Payer: Self-pay

## 2021-08-28 ENCOUNTER — Other Ambulatory Visit (HOSPITAL_COMMUNITY): Payer: Self-pay

## 2021-08-28 ENCOUNTER — Other Ambulatory Visit: Payer: Self-pay | Admitting: Critical Care Medicine

## 2021-08-28 MED ORDER — POTASSIUM CHLORIDE CRYS ER 20 MEQ PO TBCR
20.0000 meq | EXTENDED_RELEASE_TABLET | Freq: Two times a day (BID) | ORAL | 0 refills | Status: DC
Start: 2021-08-28 — End: 2021-10-02
  Filled 2021-08-28: qty 60, 30d supply, fill #0

## 2021-08-28 NOTE — Telephone Encounter (Signed)
Requested medications are due for refill today.  yes ? ?Requested medications are on the active medications list.  yes ? ?Last refill. 03/19/2021 #60 3 refills ? ?Future visit scheduled.   yes ? ?Notes to clinic.  Medication refill failed protocol. ? ? ? ?Requested Prescriptions  ?Pending Prescriptions Disp Refills  ? potassium chloride SA (KLOR-CON M) 20 MEQ tablet 60 tablet 3  ?  Sig: Take 1 tablet (20 mEq total) by mouth 2 (two) times daily.  ?  ? Endocrinology:  Minerals - Potassium Supplementation Failed - 08/28/2021 11:30 AM  ?  ?  Failed - Cr in normal range and within 360 days  ?  Creat  ?Date Value Ref Range Status  ?03/26/2014 1.21 0.50 - 1.35 mg/dL Final  ? ?Creatinine, Ser  ?Date Value Ref Range Status  ?08/12/2021 1.33 (H) 0.61 - 1.24 mg/dL Final  ?  ?  ?  ?  Passed - K in normal range and within 360 days  ?  Potassium  ?Date Value Ref Range Status  ?08/12/2021 3.8 3.5 - 5.1 mmol/L Final  ?  ?  ?  ?  Passed - Valid encounter within last 12 months  ?  Recent Outpatient Visits   ? ?      ? 5 months ago Primary hypertension  ? [facility] Storm Frisk, MD  ? 7 months ago Chronic systolic heart failure (HCC)due to Hypertension  ? [facility] Storm Frisk, MD  ? 11 months ago Chronic systolic heart failure (HCC)due to Hypertension  ? [facility] Storm Frisk, MD  ? 1 year ago Chronic systolic heart failure (HCC)due to Hypertension  ? [facility] Storm Frisk, MD  ? 1 year ago Chronic systolic heart failure (HCC)due to Hypertension  ? [facility] Storm Frisk, MD  ? ?  ?  ?Future Appointments   ? ?        ? In 2 weeks Storm Frisk, MD [facility]  ? ?  ? ? ?  ?  ?  ?  ?

## 2021-08-29 ENCOUNTER — Other Ambulatory Visit (HOSPITAL_COMMUNITY): Payer: Self-pay

## 2021-09-01 ENCOUNTER — Other Ambulatory Visit (HOSPITAL_COMMUNITY): Payer: Self-pay

## 2021-09-02 ENCOUNTER — Other Ambulatory Visit (HOSPITAL_COMMUNITY): Payer: Self-pay

## 2021-09-13 NOTE — Progress Notes (Incomplete)
? ?Established Patient Office Visit ? ?Subjective:  ?Patient ID: Derek Reed, male    DOB: 1985/06/22  Age: 36 y.o. MRN: [id number] ? ?CC:  ?No chief complaint on file. ? ? ?HPI ?03/2021 ?Derek Reed presents for primary care follow-up.  Patient has chronic systolic heart failure and has had recovered his ejection fraction.  Patient still has significant hypertension ejection fraction remains now 55 to 60%.  Patient remains euvolemic.  He tends to run low potassium as per last week's lab data.  Patient states he is eating a lot of smoothies for meals.  Patient is under a great deal of stress and would be interested in speaking to a behavioral therapist.  He did hurt his left wrist at work he works as a Optician, dispensing on a Production designer, theatre/television/film.  Patient went to the emergency room and was given over-the-counter relief.  Patient does have a wrist brace to wear at work.  X-rays were negative. ? ?Patient arrives with blood pressure 138/85 he had not taken his medications this morning.  He has been compliant with his amlodipine, Entresto, Coreg, BiDil, potassium, spironolactone. ? ?Below is documentation for the last heart failure visit a week ago ?1. Chronic systolic CHF => chronic HF with recovered EF: Nonischemic cardiomyopathy.  Echo 10/20 w/ reduced LVEF 25-30% with mild LV dilation and mild LV hypertrophy, mildly decreased RV systolic function.  Cause of cardiomyopathy may be long-standing uncontrolled HTN versus viral myocarditis. LHC/RHC showed normal filling pressures and cardiac output after diuresis, no coronary disease on angiography.  No drugs/ETOH/smoking.  Mother with "heart disease" but he is not clear on the diagnosis.  Echo in 1/21 with significant improvement, EF up to 55-60% with medical treatment. Echo was done today and reviewed, EF remains 55-60%.  NYHA class I symptoms, euvolemic.  ?- Continue Entresto 97/103 bid ?- No Lasix needed.   ?- Continue spironolactone 25 mg daily.  BMET today.  ?-  Continue Coreg 12.5 mg bid, will not increase with low HR.     ?- Continue Bidil 2 tabs tid.  ?2. HTN: This may be the cause of his cardiomyopathy.  BP is still mildly elevated.  ?- Add amlodipine 2.5 mg daily.  ?3. CKD stage 3: Last creatinine 1.66.  ?- BMET today.  ?- When BP is controlled, would add on SGLT2 inhibitor for renoprotection.   ?  ?Patient's renal function is stable ? ?Patient did agree and received a pneumonia vaccine he had already had a flu vaccine earlier ? ?09/15/21 ? ? ?Past Medical History:  ?Diagnosis Date  ?? Acute CHF (congestive heart failure) ([place]) 02/24/2019  ?? Anxiety   ?? Hypertension   ?? Obesity   ?? Peritonsillar abscess   ? ? ?Past Surgical History:  ?Procedure Laterality Date  ?? INCISION AND DRAINAGE OF PERITONSILLAR ABCESS    ?? RIGHT/LEFT HEART CATH AND CORONARY ANGIOGRAPHY N/A 02/24/2019  ? Procedure: RIGHT/LEFT HEART CATH AND CORONARY ANGIOGRAPHY;  Surgeon: Larey Dresser, MD;  Location: [place] [facility];  Service: Cardiovascular;  Laterality: N/A;  ? ? ?Family History  ?Problem Relation Age of Onset  ?? Hyperlipidemia Mother   ?? Diabetes Mother   ?? Stroke Mother   ?? Hypertension Mother   ?? Hypertension Father   ? ? ?Social History  ? ?Socioeconomic History  ?? Marital status: Single  ?  Spouse name: Not on file  ?? Number of children: Not on file  ?? Years of education: Not on file  ?? Highest education  level: Not on file  ?Occupational History  ?? Not on file  ?Tobacco Use  ?? Smoking status: Former  ?? Smokeless tobacco: Never  ?Vaping Use  ?? Vaping Use: Never used  ?Substance and Sexual Activity  ?? Alcohol use: No  ?? Drug use: No  ?? Sexual activity: Not on file  ?Other Topics Concern  ?? Not on file  ?Social History Narrative  ?? Not on file  ? ?Social Determinants of Health  ? ?Financial Resource Strain: Not on file  ?Food Insecurity: Not on file  ?Transportation Needs: Not on file  ?Physical Activity: Not on file  ?Stress: Not on file  ?Social Connections: Not  on file  ?Intimate Partner Violence: Not on file  ? ? ?Outpatient Medications Prior to Visit  ?Medication Sig Dispense Refill  ?? amLODipine (NORVASC) 2.5 MG tablet Take 1 tablet (2.5 mg total) by mouth daily. 30 tablet 6  ?? carvedilol (COREG) 12.5 MG tablet Take 1 tablet (12.5 mg total) by mouth 2 (two) times daily with a meal. 60 tablet 3  ?? dicyclomine (BENTYL) 20 MG tablet Take 1 tablet (20 mg total) by mouth 2 (two) times daily. 20 tablet 0  ?? isosorbide-hydrALAZINE (BIDIL) 20-37.5 MG tablet TAKE 2 TABLETS BY MOUTH 3 (THREE) TIMES DAILY. 540 tablet 3  ?? nitroGLYCERIN (NITROSTAT) 0.4 MG SL tablet Place 1 tablet (0.4 mg total) under the tongue every 5 (five) minutes as needed for chest pain. 30 tablet 3  ?? omeprazole (PRILOSEC) 20 MG capsule Take 1 capsule (20 mg total) by mouth daily. 30 capsule 0  ?? potassium chloride SA (KLOR-CON M) 20 MEQ tablet Take 1 tablet by mouth 2 times daily. 60 tablet 0  ?? sacubitril-valsartan (ENTRESTO) 97-103 MG TAKE 1 TABLET BY MOUTH 2 (TWO) TIMES DAILY. 180 tablet 3  ?? spironolactone (ALDACTONE) 25 MG tablet Take 1 tablet (25 mg total) by mouth daily. 90 tablet 1  ? ?No facility-administered medications prior to visit.  ? ? ?No Known Allergies ? ?ROS ?Review of Systems  ?Constitutional: Negative.   ?HENT: Negative.  Negative for ear pain, postnasal drip, rhinorrhea, sinus pressure, sore throat, trouble swallowing and voice change.   ?Eyes: Negative.   ?Respiratory: Negative.  Negative for apnea, cough, choking, chest tightness, shortness of breath, wheezing and stridor.   ?Cardiovascular: Negative.  Negative for chest pain, palpitations and leg swelling.  ?Gastrointestinal: Negative.  Negative for abdominal distention, abdominal pain, nausea and vomiting.  ?Genitourinary: Negative.   ?Musculoskeletal: Negative.  Negative for arthralgias and myalgias.  ?Skin: Negative.  Negative for rash.  ?Allergic/Immunologic: Negative.  Negative for environmental allergies and food  allergies.  ?Neurological: Negative.  Negative for dizziness, syncope, weakness and headaches.  ?Hematological: Negative.  Negative for adenopathy. Does not bruise/bleed easily.  ?Psychiatric/Behavioral: Negative.  Negative for agitation and sleep disturbance. The patient is not nervous/anxious.   ? ?  ?Objective:  ?  ?Physical Exam ?Vitals reviewed.  ?Constitutional:   ?   Appearance: Normal appearance. He is well-developed. He is obese. He is not diaphoretic.  ?HENT:  ?   Head: Normocephalic and atraumatic.  ?   Nose: No nasal deformity, septal deviation, mucosal edema or rhinorrhea.  ?   Right Sinus: No maxillary sinus tenderness or frontal sinus tenderness.  ?   Left Sinus: No maxillary sinus tenderness or frontal sinus tenderness.  ?   Mouth/Throat:  ?   Pharynx: No oropharyngeal exudate.  ?Eyes:  ?   General: No scleral icterus. ?  Conjunctiva/sclera: Conjunctivae normal.  ?   Pupils: Pupils are equal, round, and reactive to light.  ?Neck:  ?   Thyroid: No thyromegaly.  ?   Vascular: No carotid bruit or JVD.  ?   Trachea: Trachea normal. No tracheal tenderness or tracheal deviation.  ?Cardiovascular:  ?   Rate and Rhythm: Normal rate and regular rhythm.  ?   Chest Wall: PMI is not displaced.  ?   Pulses: Normal pulses. No decreased pulses.  ?   Heart sounds: Normal heart sounds, S1 normal and S2 normal. Heart sounds not distant. No murmur heard. ?No systolic murmur is present.  ?No diastolic murmur is present.  ?  No friction rub. No gallop. No S3 or S4 sounds.  ?Pulmonary:  ?   Effort: Pulmonary effort is normal. No tachypnea, accessory muscle usage or respiratory distress.  ?   Breath sounds: Normal breath sounds. No stridor. No decreased breath sounds, wheezing, rhonchi or rales.  ?Chest:  ?   Chest wall: No tenderness.  ?Abdominal:  ?   General: Bowel sounds are normal. There is no distension.  ?   Palpations: Abdomen is soft. Abdomen is not rigid.  ?   Tenderness: There is no abdominal tenderness. There  is no guarding or rebound.  ?Musculoskeletal:     ?   General: Normal range of motion.  ?   Cervical back: Normal range of motion and neck supple. No edema, erythema or rigidity. No muscular tenderness. Constance Holster

## 2021-09-15 ENCOUNTER — Other Ambulatory Visit: Payer: Self-pay | Admitting: Critical Care Medicine

## 2021-09-15 ENCOUNTER — Other Ambulatory Visit (HOSPITAL_COMMUNITY): Payer: Self-pay

## 2021-09-15 ENCOUNTER — Ambulatory Visit: Payer: BC Managed Care – PPO | Admitting: Critical Care Medicine

## 2021-09-16 ENCOUNTER — Other Ambulatory Visit (HOSPITAL_COMMUNITY): Payer: Self-pay

## 2021-09-16 ENCOUNTER — Encounter: Payer: Self-pay | Admitting: Critical Care Medicine

## 2021-09-16 MED ORDER — CARVEDILOL 12.5 MG PO TABS
12.5000 mg | ORAL_TABLET | Freq: Two times a day (BID) | ORAL | 2 refills | Status: DC
Start: 2021-09-16 — End: 2021-10-27
  Filled 2021-09-16: qty 60, 30d supply, fill #0
  Filled 2021-10-16: qty 60, 30d supply, fill #1

## 2021-09-16 NOTE — Telephone Encounter (Signed)
Requested Prescriptions  ?Pending Prescriptions Disp Refills  ?? carvedilol (COREG) 12.5 MG tablet 60 tablet 2  ?  Sig: Take 1 tablet (12.5 mg total) by mouth 2 (two) times daily with a meal.  ?  ? Cardiovascular: Beta Blockers 3 Failed - 09/15/2021  3:09 PM  ?  ?  Failed - Cr in normal range and within 360 days  ?  Creat  ?Date Value Ref Range Status  ?03/26/2014 1.21 0.50 - 1.35 mg/dL Final  ? ?Creatinine, Ser  ?Date Value Ref Range Status  ?08/12/2021 1.33 (H) 0.61 - 1.24 mg/dL Final  ?   ?  ?  Passed - AST in normal range and within 360 days  ?  AST  ?Date Value Ref Range Status  ?08/12/2021 17 15 - 41 U/L Final  ?   ?  ?  Passed - ALT in normal range and within 360 days  ?  ALT  ?Date Value Ref Range Status  ?08/12/2021 15 0 - 44 U/L Final  ?   ?  ?  Passed - Last BP in normal range  ?  BP Readings from Last 1 Encounters:  ?08/12/21 132/83  ?   ?  ?  Passed - Last Heart Rate in normal range  ?  Pulse Readings from Last 1 Encounters:  ?08/12/21 (!) 56  ?   ?  ?  Passed - Valid encounter within last 6 months  ?  Recent Outpatient Visits   ?      ? 6 months ago Primary hypertension  ? [facility] Storm Frisk, MD  ? 8 months ago Chronic systolic heart failure (HCC)due to Hypertension  ? [facility] Storm Frisk, MD  ? 11 months ago Chronic systolic heart failure (HCC)due to Hypertension  ? [facility] Storm Frisk, MD  ? 1 year ago Chronic systolic heart failure (HCC)due to Hypertension  ? [facility] Storm Frisk, MD  ? 1 year ago Chronic systolic heart failure (HCC)due to Hypertension  ? [facility] Storm Frisk, MD  ?  ?  ?Future Appointments   ?        ? In 2 months Storm Frisk, MD [facility]  ?  ? ?  ?  ?  ? ?

## 2021-09-16 NOTE — Telephone Encounter (Signed)
Make sure pt has another appt scheduled with me  ?

## 2021-10-02 ENCOUNTER — Other Ambulatory Visit: Payer: Self-pay | Admitting: Critical Care Medicine

## 2021-10-02 ENCOUNTER — Other Ambulatory Visit (HOSPITAL_COMMUNITY): Payer: Self-pay

## 2021-10-02 MED ORDER — POTASSIUM CHLORIDE CRYS ER 20 MEQ PO TBCR
20.0000 meq | EXTENDED_RELEASE_TABLET | Freq: Two times a day (BID) | ORAL | 0 refills | Status: DC
Start: 2021-10-02 — End: 2021-10-27
  Filled 2021-10-02: qty 60, 30d supply, fill #0

## 2021-10-16 ENCOUNTER — Other Ambulatory Visit (HOSPITAL_COMMUNITY): Payer: Self-pay

## 2021-10-16 ENCOUNTER — Other Ambulatory Visit: Payer: Self-pay | Admitting: Critical Care Medicine

## 2021-10-16 MED ORDER — SPIRONOLACTONE 25 MG PO TABS
25.0000 mg | ORAL_TABLET | Freq: Every day | ORAL | 0 refills | Status: DC
Start: 2021-10-16 — End: 2021-10-27
  Filled 2021-10-16: qty 30, 30d supply, fill #0

## 2021-10-16 NOTE — Telephone Encounter (Signed)
Requested Prescriptions  Pending Prescriptions Disp Refills  . spironolactone (ALDACTONE) 25 MG tablet 90 tablet 1    Sig: Take 1 tablet (25 mg total) by mouth daily.     Cardiovascular: Diuretics - Aldosterone Antagonist Failed - 10/16/2021 10:27 AM      Failed - Cr in normal range and within 180 days    Creat  Date Value Ref Range Status  03/26/2014 1.21 0.50 - 1.35 mg/dL Final   Creatinine, Ser  Date Value Ref Range Status  08/12/2021 1.33 (H) 0.61 - 1.24 mg/dL Final         Failed - Valid encounter within last 6 months    Recent Outpatient Visits          7 months ago Primary hypertension   [place] Elsie Stain, MD   9 months ago Chronic systolic heart failure (HCC)due to Hypertension   [place] Elsie Stain, MD   1 year ago Chronic systolic heart failure (HCC)due to Hypertension   [place] Elsie Stain, MD   1 year ago Chronic systolic heart failure (HCC)due to Hypertension   [place] Elsie Stain, MD   2 years ago Chronic systolic heart failure (HCC)due to Hypertension   [place] Elsie Stain, MD      Future Appointments            In 1 month Elsie Stain, MD [place] - K in normal range and within 180 days    Potassium  Date Value Ref Range Status  08/12/2021 3.8 3.5 - 5.1 mmol/L Final         Passed - Na in normal range and within 180 days    Sodium  Date Value Ref Range Status  08/12/2021 137 135 - 145 mmol/L Final  01/01/2021 138 134 - 144 mmol/L Final         Passed - eGFR is 30 or above and within 180 days    GFR calc Af Amer  Date Value Ref Range Status  10/30/2019 >60 >60 mL/min Final   GFR, Estimated  Date Value Ref Range Status  08/12/2021 >60 >60 mL/min Final    Comment:    (NOTE) Calculated using  the CKD-EPI Creatinine Equation (2021)    eGFR  Date Value Ref Range Status  01/01/2021 54 (L) >59 mL/min/1.73 Final         Passed - Last BP in normal range    BP Readings from Last 1 Encounters:  08/12/21 132/83

## 2021-10-17 ENCOUNTER — Other Ambulatory Visit (HOSPITAL_COMMUNITY): Payer: Self-pay

## 2021-10-26 ENCOUNTER — Encounter: Payer: Self-pay | Admitting: Critical Care Medicine

## 2021-10-27 ENCOUNTER — Encounter: Payer: Self-pay | Admitting: Critical Care Medicine

## 2021-10-27 MED ORDER — SACUBITRIL-VALSARTAN 97-103 MG PO TABS: 1.0000 | ORAL_TABLET | Freq: Two times a day (BID) | ORAL | 3 refills | Status: DC

## 2021-10-27 MED ORDER — CARVEDILOL 12.5 MG PO TABS: 12.5000 mg | ORAL_TABLET | Freq: Two times a day (BID) | ORAL | 2 refills | Status: DC

## 2021-10-27 MED ORDER — OMEPRAZOLE 20 MG PO CPDR: 20.0000 mg | DELAYED_RELEASE_CAPSULE | Freq: Every day | ORAL | 2 refills | Status: DC

## 2021-10-27 MED ORDER — ISOSORB DINITRATE-HYDRALAZINE 20-37.5 MG PO TABS: ORAL_TABLET | ORAL | 3 refills | Status: DC

## 2021-10-27 MED ORDER — NITROGLYCERIN 0.4 MG SL SUBL
0.4000 mg | SUBLINGUAL_TABLET | SUBLINGUAL | 3 refills | Status: DC | PRN
Start: 2021-10-27 — End: 2022-12-11

## 2021-10-27 MED ORDER — DICYCLOMINE HCL 20 MG PO TABS: 20.0000 mg | ORAL_TABLET | Freq: Two times a day (BID) | ORAL | 0 refills | Status: DC

## 2021-10-27 MED ORDER — AMLODIPINE BESYLATE 2.5 MG PO TABS: 2.5000 mg | ORAL_TABLET | Freq: Every day | ORAL | 6 refills | Status: DC

## 2021-10-27 MED ORDER — POTASSIUM CHLORIDE CRYS ER 20 MEQ PO TBCR: 20.0000 meq | EXTENDED_RELEASE_TABLET | Freq: Two times a day (BID) | ORAL | 1 refills | Status: DC

## 2021-10-27 MED ORDER — SPIRONOLACTONE 25 MG PO TABS: 25.0000 mg | ORAL_TABLET | Freq: Every day | ORAL | 1 refills | Status: DC

## 2021-11-06 ENCOUNTER — Other Ambulatory Visit (HOSPITAL_COMMUNITY): Payer: Self-pay

## 2021-11-06 ENCOUNTER — Other Ambulatory Visit: Payer: Self-pay | Admitting: Critical Care Medicine

## 2021-11-06 NOTE — Telephone Encounter (Signed)
Refused potassium chloride 20 meq tablets.   This rx went to [facility] on 10/27/2021.  This request is from [facility].

## 2021-11-08 ENCOUNTER — Other Ambulatory Visit (HOSPITAL_COMMUNITY): Payer: Self-pay

## 2021-11-12 ENCOUNTER — Encounter: Payer: Self-pay | Admitting: Critical Care Medicine

## 2021-11-12 ENCOUNTER — Other Ambulatory Visit (HOSPITAL_COMMUNITY): Payer: Self-pay | Admitting: Cardiology

## 2021-11-12 ENCOUNTER — Other Ambulatory Visit (HOSPITAL_COMMUNITY): Payer: Self-pay

## 2021-11-12 MED ORDER — SACUBITRIL-VALSARTAN 97-103 MG PO TABS
1.0000 | ORAL_TABLET | Freq: Two times a day (BID) | ORAL | 3 refills | Status: DC
  Filled 2021-11-12: qty 60, 30d supply, fill #0

## 2021-11-13 ENCOUNTER — Other Ambulatory Visit (HOSPITAL_COMMUNITY): Payer: Self-pay

## 2021-11-13 MED ORDER — ENTRESTO 97-103 MG PO TABS
1.0000 | ORAL_TABLET | Freq: Two times a day (BID) | ORAL | 3 refills | Status: DC
  Filled 2021-11-13: qty 180, 90d supply, fill #0
  Filled 2021-12-17: qty 60, 30d supply, fill #0
  Filled 2022-01-11: qty 60, 30d supply, fill #1
  Filled 2022-02-12: qty 60, 30d supply, fill #2
  Filled 2022-03-19: qty 60, 30d supply, fill #3

## 2021-11-18 ENCOUNTER — Ambulatory Visit: Payer: BC Managed Care – PPO | Attending: Critical Care Medicine | Admitting: Critical Care Medicine

## 2021-11-18 ENCOUNTER — Encounter: Payer: Self-pay | Admitting: Critical Care Medicine

## 2021-11-18 VITALS — BP 154/87 | HR 64 | Ht 66.0 in | Wt 251.2 lb

## 2021-11-18 DIAGNOSIS — I10 Essential (primary) hypertension: Secondary | ICD-10-CM

## 2021-11-18 DIAGNOSIS — I5022 Chronic systolic (congestive) heart failure: Secondary | ICD-10-CM

## 2021-11-18 DIAGNOSIS — Z6836 Body mass index (BMI) 36.0-36.9, adult: Secondary | ICD-10-CM

## 2021-11-18 DIAGNOSIS — F411 Generalized anxiety disorder: Secondary | ICD-10-CM | POA: Diagnosis not present

## 2021-11-18 DIAGNOSIS — N182 Chronic kidney disease, stage 2 (mild): Secondary | ICD-10-CM

## 2021-11-18 MED ORDER — CHLORTHALIDONE 25 MG PO TABS
25.0000 mg | ORAL_TABLET | Freq: Every day | ORAL | 2 refills | Status: DC
Start: 2021-11-18 — End: 2022-06-24

## 2021-11-18 MED ORDER — AMLODIPINE BESYLATE 10 MG PO TABS: 10.0000 mg | ORAL_TABLET | Freq: Every day | ORAL | 2 refills | Status: DC

## 2021-11-18 NOTE — Assessment & Plan Note (Signed)
Would benefit from licensed clinical social work counseling we will make referral

## 2021-11-18 NOTE — Assessment & Plan Note (Signed)
Reassess metabolic panel 

## 2021-11-18 NOTE — Assessment & Plan Note (Signed)
As per hypertension assessment 

## 2021-11-18 NOTE — Patient Instructions (Signed)
Start chlorthalidone 1 pill daily and amlodipine 1 pill daily both will be sent to [facility] and mailed to you  No change in other medications  Referral to cardiology will be made  We discussed healthy lifestyle medicine approach and you already have the handout we mailed to you  Complete set of screening labs obtained at this visit  Return to see Dr. Delford Field 6 weeks for blood pressure check and please obtain a blood pressure meter and measure your blood pressure 1-2 times daily write the numbers down and bring the numbers or the blood pressure meter with you on your next visit

## 2021-11-18 NOTE — Progress Notes (Signed)
Established Patient Office Visit  Subjective   Patient ID: Derek Reed, male    DOB: Mar 17, 1986  Age: 36 y.o. MRN: [id number]  No chief complaint on file.   Derek Reed is a 36 year old male with history of Chronic Kidney Disease stage 3, Type 2 Diabetes, Chronic Systolic heart failure, and anxiety who returns to office today. Patient has recovered his ejection fraction, last value 55-60% on  most recent Echo 02/2021. States he has been juicing with fresh fruit recently for his meals. Reports his stress still remains quite high as he is thinking a lot while at work and is interested in behavioral therapy, previously recommended last visit.    Today's blood pressure is 154/87,states he has been compliant with medications amlodipine, Entresto, Coreg, BiDil, potassium, and spironolactone. He has not been taking his pressures at home and plans to buy a machine soon. He does note some recent headaches which happen intermittently and self resolve, he believes this may be stress/work related.      Patient Active Problem List   Diagnosis Date Noted   HTN (hypertension) 02/24/2019   CKD (chronic kidney disease), stage II [id number]   Chronic systolic heart failure (HCC)due to Hypertension    GAD (generalized anxiety disorder) 03/26/2014   BMI 36.0-36.9,adult 03/26/2014   Past Medical History:  Diagnosis Date   Acute CHF (congestive heart failure) ([place]) 02/24/2019   Anxiety    Hypertension    Obesity    Peritonsillar abscess    Past Surgical History:  Procedure Laterality Date   INCISION AND DRAINAGE OF PERITONSILLAR ABCESS     RIGHT/LEFT HEART CATH AND CORONARY ANGIOGRAPHY N/A 02/24/2019   Procedure: RIGHT/LEFT HEART CATH AND CORONARY ANGIOGRAPHY;  Surgeon: Derek Dresser, MD;  Location: [facility];  Service: Cardiovascular;  Laterality: N/A;   Social History   Tobacco Use   Smoking status: Former   Smokeless tobacco: Never  Scientific laboratory technician Use: Never used   Substance Use Topics   Alcohol use: No   Drug use: No   Social History   Socioeconomic History   Marital status: Single    Spouse name: Not on file   Number of children: Not on file   Years of education: Not on file   Highest education level: Not on file  Occupational History   Not on file  Tobacco Use   Smoking status: Former   Smokeless tobacco: Never  Vaping Use   Vaping Use: Never used  Substance and Sexual Activity   Alcohol use: No   Drug use: No   Sexual activity: Not on file  Other Topics Concern   Not on file  Social History Narrative   Not on file   Social Determinants of Health   Financial Resource Strain: Not on file  Food Insecurity: Not on file  Transportation Needs: Not on file  Physical Activity: Not on file  Stress: Not on file  Social Connections: Not on file  Intimate Partner Violence: Not on file   Family Status  Relation Name Status   Mother  (Not Specified)   Father  (Not Specified)   Family History  Problem Relation Age of Onset   Hyperlipidemia Mother    Diabetes Mother    Stroke Mother    Hypertension Mother    Hypertension Father    No Known Allergies    Review of Systems  Constitutional: Negative.  Negative for chills, diaphoresis, fever, malaise/fatigue and weight loss.  HENT:  Negative for congestion, hearing loss, nosebleeds, sore throat and tinnitus.   Eyes: Negative.  Negative for blurred vision, photophobia and redness.  Respiratory: Negative.  Negative for cough, hemoptysis, sputum production, shortness of breath, wheezing and stridor.   Cardiovascular: Negative.  Negative for chest pain, palpitations, orthopnea, claudication, leg swelling and PND.  Gastrointestinal: Negative.  Negative for abdominal pain, blood in stool, constipation, diarrhea, heartburn, nausea and vomiting.  Genitourinary: Negative.  Negative for dysuria, flank pain, frequency, hematuria and urgency.  Musculoskeletal: Negative.  Negative for back  pain, falls, joint pain, myalgias and neck pain.  Skin: Negative.  Negative for itching and rash.  Neurological:  Positive for headaches. Negative for dizziness, tingling, tremors, sensory change, speech change, focal weakness, seizures, loss of consciousness and weakness.  Endo/Heme/Allergies: Negative.  Negative for environmental allergies and polydipsia. Does not bruise/bleed easily.  Psychiatric/Behavioral: Negative.  Negative for depression, memory loss, substance abuse and suicidal ideas. The patient is not nervous/anxious and does not have insomnia.       Objective:     BP (!) 154/87   Pulse 64   Ht 5\' 6"  (1.676 m)   Wt 251 lb 3.2 oz (113.9 kg)   SpO2 98%   BMI 40.54 kg/m  BP Readings from Last 3 Encounters:  11/18/21 (!) 154/87  08/12/21 132/83  07/14/21 122/82   Wt Readings from Last 3 Encounters:  11/18/21 251 lb 3.2 oz (113.9 kg)  08/12/21 250 lb 9.6 oz (113.7 kg)  07/14/21 249 lb 1.6 oz (113 kg)      Physical Exam Vitals reviewed.  Constitutional:      General: He is awake.     Appearance: Normal appearance. He is well-developed and overweight. He is not diaphoretic.  HENT:     Head: Normocephalic and atraumatic.     Nose: No nasal deformity, septal deviation, mucosal edema or rhinorrhea.     Right Sinus: No maxillary sinus tenderness or frontal sinus tenderness.     Left Sinus: No maxillary sinus tenderness or frontal sinus tenderness.     Mouth/Throat:     Pharynx: No oropharyngeal exudate.  Eyes:     General: Lids are normal. No scleral icterus.    Conjunctiva/sclera: Conjunctivae normal.     Pupils: Pupils are equal, round, and reactive to light.  Neck:     Thyroid: No thyromegaly.     Vascular: No carotid bruit or JVD.     Trachea: Trachea normal. No tracheal tenderness or tracheal deviation.  Cardiovascular:     Rate and Rhythm: Normal rate and regular rhythm.     Chest Wall: PMI is not displaced.     Pulses: Normal pulses. No decreased pulses.      Heart sounds: Normal heart sounds, S1 normal and S2 normal. Heart sounds not distant. No murmur heard.    No systolic murmur is present.     No diastolic murmur is present.     No friction rub. No gallop. No S3 or S4 sounds.  Pulmonary:     Effort: Pulmonary effort is normal. No tachypnea, accessory muscle usage or respiratory distress.     Breath sounds: Normal breath sounds. No stridor. No decreased breath sounds, wheezing, rhonchi or rales.  Chest:     Chest wall: No tenderness.  Abdominal:     General: Bowel sounds are normal. There is no distension.     Palpations: Abdomen is soft. Abdomen is not rigid.     Tenderness: There is no abdominal tenderness. There  is no guarding or rebound.  Musculoskeletal:        General: Normal range of motion.     Cervical back: Normal range of motion and neck supple. No edema, erythema or rigidity. No muscular tenderness. Normal range of motion.     Right lower leg: Laceration (anterior lower leg, 4 inch healing laceration, no signs infection) present. 1+ Edema present.     Left lower leg: Normal. No edema.  Lymphadenopathy:     Head:     Right side of head: No submental or submandibular adenopathy.     Left side of head: No submental or submandibular adenopathy.     Cervical: No cervical adenopathy.  Skin:    General: Skin is warm and dry.     Coloration: Skin is not pale.     Findings: No rash.     Nails: There is no clubbing.  Neurological:     General: No focal deficit present.     Mental Status: He is alert and oriented to person, place, and time.     Sensory: No sensory deficit.  Psychiatric:        Attention and Perception: Attention and perception normal.        Mood and Affect: Mood normal.        Speech: Speech normal.        Behavior: Behavior normal. Behavior is cooperative.      No results found for any visits on 11/18/21.  Last CBC Lab Results  Component Value Date   WBC 7.0 08/12/2021   HGB 11.6 (L) 08/12/2021    HCT 36.2 (L) 08/12/2021   MCV 72.7 (L) 08/12/2021   MCH 23.3 (L) 08/12/2021   RDW 16.3 (H) 08/12/2021   PLT 278 [id number]   Last metabolic panel Lab Results  Component Value Date   GLUCOSE 115 (H) 08/12/2021   NA 137 08/12/2021   K 3.8 08/12/2021   CL 107 08/12/2021   CO2 23 08/12/2021   BUN 15 08/12/2021   CREATININE 1.33 (H) 08/12/2021   GFRNONAA >60 08/12/2021   CALCIUM 8.4 (L) 08/12/2021   PROT 7.2 08/12/2021   ALBUMIN 3.6 08/12/2021   BILITOT 0.4 08/12/2021   ALKPHOS 70 08/12/2021   AST 17 08/12/2021   ALT 15 08/12/2021   ANIONGAP 7 08/12/2021   Last lipids Lab Results  Component Value Date   CHOL 183 02/24/2019   HDL 31 (L) 02/24/2019   LDLCALC 139 (H) 02/24/2019   TRIG 65 02/24/2019   CHOLHDL 5.9 02/24/2019   Last hemoglobin A1c Lab Results  Component Value Date   HGBA1C 6.2 (H) 02/24/2019   Last thyroid functions No results found for: "TSH", "T3TOTAL", "T4TOTAL", "THYROIDAB" Last vitamin D No results found for: "25OHVITD2", "25OHVITD3", "VD25OH"    The ASCVD Risk score (Arnett DK, et al., 2019) failed to calculate for the following reasons:   The 2019 ASCVD risk score is only valid for ages 77 to 30    Assessment & Plan:   Problem List Items Addressed This Visit       Cardiovascular and Mediastinum   HTN (hypertension)    Blood pressure not at goal patient has not been taking amlodipine consistently  We will resume amlodipine 10 mg daily and begin chlorthalidone 25 mg daily  Reassess metabolic panel  Get patient back in with cardiology as soon as possible  The following Lifestyle Medicine recommendations according to [facility]) were discussed and offered to patient who agrees  to start the journey:  A. Whole Foods, Plant-based plate comprising of fruits and vegetables, plant-based proteins, whole-grain carbohydrates was discussed in detail with the patient.   A list for source of those nutrients were also  provided to the patient.  Patient will use only water or unsweetened tea for hydration. B.  The need to stay away from risky substances including alcohol, smoking; obtaining 7 to 9 hours of restorative sleep, at least 150 minutes of moderate intensity exercise weekly, the importance of healthy social connections,  and stress reduction techniques were discussed. C.  A full color page of  Calorie density of various food groups per pound showing examples of each food groups was provided to the patient.       Relevant Medications   amLODipine (NORVASC) 10 MG tablet   chlorthalidone (HYGROTON) 25 MG tablet   Other Relevant Orders   CBC with Differential/Platelet   Chronic systolic heart failure (HCC)due to Hypertension - Primary    As per hypertension assessment      Relevant Medications   amLODipine (NORVASC) 10 MG tablet   chlorthalidone (HYGROTON) 25 MG tablet   Other Relevant Orders   CBC with Differential/Platelet   Lipid panel   Ambulatory referral to Cardiology     Genitourinary   CKD (chronic kidney disease), stage II    Reassess metabolic panel      Relevant Orders   Comprehensive metabolic panel     Other   GAD (generalized anxiety disorder)    Would benefit from licensed clinical social work counseling we will make referral      BMI 36.0-36.9,adult    As per hypertension assessment       Return in about 6 weeks (around 12/30/2021).    Shan Levans, MD

## 2021-11-18 NOTE — Assessment & Plan Note (Signed)
Blood pressure not at goal patient has not been taking amlodipine consistently  We will resume amlodipine 10 mg daily and begin chlorthalidone 25 mg daily  Reassess metabolic panel  Get patient back in with cardiology as soon as possible  The following Lifestyle Medicine recommendations according to [employer] of [employer]estyle [employer] [facility]) were discussed and offered to patient who agrees to start the journey:  A. Whole Foods, Plant-based plate comprising of fruits and vegetables, plant-based proteins, whole-grain carbohydrates was discussed in detail with the patient.   A list for source of those nutrients were also provided to the patient.  Patient will use only water or unsweetened tea for hydration. B.  The need to stay away from risky substances including alcohol, smoking; obtaining 7 to 9 hours of restorative sleep, at least 150 minutes of moderate intensity exercise weekly, the importance of healthy social connections,  and stress reduction techniques were discussed. C.  A full color page of  Calorie density of various food groups per pound showing examples of each food groups was provided to the patient.

## 2021-11-19 ENCOUNTER — Telehealth: Payer: Self-pay

## 2021-11-19 ENCOUNTER — Other Ambulatory Visit: Payer: Self-pay | Admitting: Critical Care Medicine

## 2021-11-19 DIAGNOSIS — E782 Mixed hyperlipidemia: Secondary | ICD-10-CM | POA: Insufficient documentation

## 2021-11-19 LAB — LIPID PANEL
Chol/HDL Ratio: 4.9 ratio (ref 0.0–5.0)
Cholesterol, Total: 162 mg/dL (ref 100–199)
HDL: 33 mg/dL — ABNORMAL LOW (ref >39)
LDL Chol Calc (NIH): 103 mg/dL — ABNORMAL HIGH (ref 0–99)
Triglycerides: 143 mg/dL (ref 0–149)
VLDL Cholesterol Cal: 26 mg/dL (ref 5–40)

## 2021-11-19 LAB — CBC WITH DIFFERENTIAL/PLATELET
Basophils Absolute: 0 10*3/uL (ref 0.0–0.2)
Basos: 1 %
EOS (ABSOLUTE): 0.2 10*3/uL (ref 0.0–0.4)
Eos: 4 %
Hematocrit: 40.8 % (ref 37.5–51.0)
Hemoglobin: 12.6 g/dL — ABNORMAL LOW (ref 13.0–17.7)
Immature Grans (Abs): 0 10*3/uL (ref 0.0–0.1)
Immature Granulocytes: 0 %
Lymphocytes Absolute: 2.3 10*3/uL (ref 0.7–3.1)
Lymphs: 38 %
MCH: 23.4 pg — ABNORMAL LOW (ref 26.6–33.0)
MCHC: 30.9 g/dL — ABNORMAL LOW (ref 31.5–35.7)
MCV: 76 fL — ABNORMAL LOW (ref 79–97)
Monocytes Absolute: 0.6 10*3/uL (ref 0.1–0.9)
Monocytes: 9 %
Neutrophils Absolute: 2.9 10*3/uL (ref 1.4–7.0)
Neutrophils: 48 %
Platelets: 256 10*3/uL (ref 150–450)
RBC: 5.39 x10E6/uL (ref 4.14–5.80)
RDW: 14.9 % (ref 11.6–15.4)
WBC: 6.1 10*3/uL (ref 3.4–10.8)

## 2021-11-19 LAB — COMPREHENSIVE METABOLIC PANEL
ALT: 14 IU/L (ref 0–44)
AST: 15 IU/L (ref 0–40)
Albumin/Globulin Ratio: 1.3 (ref 1.2–2.2)
Albumin: 4 g/dL — ABNORMAL LOW (ref 4.1–5.1)
Alkaline Phosphatase: 77 IU/L (ref 44–121)
BUN/Creatinine Ratio: 9 (ref 9–20)
BUN: 13 mg/dL (ref 6–20)
Bilirubin Total: 0.2 mg/dL (ref 0.0–1.2)
CO2: 21 mmol/L (ref 20–29)
Calcium: 9.1 mg/dL (ref 8.7–10.2)
Chloride: 105 mmol/L (ref 96–106)
Creatinine, Ser: 1.37 mg/dL — ABNORMAL HIGH (ref 0.76–1.27)
Globulin, Total: 3.2 g/dL (ref 1.5–4.5)
Glucose: 92 mg/dL (ref 70–99)
Potassium: 4.5 mmol/L (ref 3.5–5.2)
Sodium: 140 mmol/L (ref 134–144)
Total Protein: 7.2 g/dL (ref 6.0–8.5)
eGFR: 68 mL/min/{1.73_m2} (ref >59)

## 2021-11-19 MED ORDER — ATORVASTATIN CALCIUM 10 MG PO TABS: 10.0000 mg | ORAL_TABLET | Freq: Every day | ORAL | 3 refills | Status: DC

## 2021-11-19 NOTE — Telephone Encounter (Signed)
-----   Message from Storm Frisk, MD sent at 11/19/2021  6:13 AM EDT ----- Let pt know kidney stable , liver normal, blood counts normal, cholesterol is high recommend starting low dose cholesterol pill, sent to pharmacy(mail order)

## 2021-11-19 NOTE — Telephone Encounter (Signed)
Pt was called and vm was left, Information has been sent to nurse pool.   

## 2021-11-19 NOTE — Progress Notes (Signed)
Let pt know kidney stable , liver normal, blood counts normal, cholesterol is high recommend starting low dose cholesterol pill, sent to pharmacy(mail order)

## 2021-11-20 ENCOUNTER — Emergency Department (HOSPITAL_BASED_OUTPATIENT_CLINIC_OR_DEPARTMENT_OTHER)
Admission: EM | Admit: 2021-11-20 | Discharge: 2021-11-20 | Disposition: A | Discharge status: Home / Self Care | Payer: BC Managed Care – PPO | Attending: Emergency Medicine | Admitting: Emergency Medicine

## 2021-11-20 ENCOUNTER — Emergency Department (HOSPITAL_BASED_OUTPATIENT_CLINIC_OR_DEPARTMENT_OTHER): Payer: BC Managed Care – PPO

## 2021-11-20 ENCOUNTER — Other Ambulatory Visit: Payer: Self-pay

## 2021-11-20 ENCOUNTER — Encounter (HOSPITAL_BASED_OUTPATIENT_CLINIC_OR_DEPARTMENT_OTHER): Payer: Self-pay | Admitting: Emergency Medicine

## 2021-11-20 DIAGNOSIS — R0789 Other chest pain: Secondary | ICD-10-CM

## 2021-11-20 DIAGNOSIS — R072 Precordial pain: Secondary | ICD-10-CM | POA: Insufficient documentation

## 2021-11-20 DIAGNOSIS — Z79899 Other long term (current) drug therapy: Secondary | ICD-10-CM | POA: Diagnosis not present

## 2021-11-20 LAB — TROPONIN I (HIGH SENSITIVITY)
Troponin I (High Sensitivity): 11 ng/L (ref <18)
Troponin I (High Sensitivity): 11 ng/L (ref <18)

## 2021-11-20 LAB — CBC
HCT: 36.8 % — ABNORMAL LOW (ref 39.0–52.0)
Hemoglobin: 11.8 g/dL — ABNORMAL LOW (ref 13.0–17.0)
MCH: 23.4 pg — ABNORMAL LOW (ref 26.0–34.0)
MCHC: 32.1 g/dL (ref 30.0–36.0)
MCV: 72.9 fL — ABNORMAL LOW (ref 80.0–100.0)
Platelets: 264 10*3/uL (ref 150–400)
RBC: 5.05 MIL/uL (ref 4.22–5.81)
RDW: 15.6 % — ABNORMAL HIGH (ref 11.5–15.5)
WBC: 7.8 10*3/uL (ref 4.0–10.5)
nRBC: 0 % (ref 0.0–0.2)

## 2021-11-20 LAB — BASIC METABOLIC PANEL
Anion gap: 6 (ref 5–15)
BUN: 15 mg/dL (ref 6–20)
CO2: 23 mmol/L (ref 22–32)
Calcium: 8.5 mg/dL — ABNORMAL LOW (ref 8.9–10.3)
Chloride: 110 mmol/L (ref 98–111)
Creatinine, Ser: 1.54 mg/dL — ABNORMAL HIGH (ref 0.61–1.24)
GFR, Estimated: 59 mL/min — ABNORMAL LOW (ref >60)
Glucose, Bld: 113 mg/dL — ABNORMAL HIGH (ref 70–99)
Potassium: 4.3 mmol/L (ref 3.5–5.1)
Sodium: 139 mmol/L (ref 135–145)

## 2021-11-20 LAB — HEPATIC FUNCTION PANEL
ALT: 15 U/L (ref 0–44)
AST: 19 U/L (ref 15–41)
Albumin: 3.6 g/dL (ref 3.5–5.0)
Alkaline Phosphatase: 61 U/L (ref 38–126)
Bilirubin, Direct: <0.1 mg/dL (ref 0.0–0.2)
Total Bilirubin: 0.4 mg/dL (ref 0.3–1.2)
Total Protein: 7 g/dL (ref 6.5–8.1)

## 2021-11-20 LAB — BRAIN NATRIURETIC PEPTIDE: B Natriuretic Peptide: 25.5 pg/mL (ref 0.0–100.0)

## 2021-11-20 LAB — LIPASE, BLOOD: Lipase: 29 U/L (ref 11–51)

## 2021-11-20 MED ORDER — LIDOCAINE VISCOUS HCL 2 % MT SOLN
15.0000 mL | Freq: Once | OROMUCOSAL | Status: AC
  Administered 2021-11-20: 15 mL via ORAL
  Filled 2021-11-20: qty 15

## 2021-11-20 MED ORDER — ALUM & MAG HYDROXIDE-SIMETH 200-200-20 MG/5ML PO SUSP
30.0000 mL | Freq: Once | ORAL | Status: AC
  Administered 2021-11-20: 30 mL via ORAL
  Filled 2021-11-20: qty 30

## 2021-11-20 NOTE — ED Provider Notes (Signed)
[facility] EMERGENCY DEPARTMENT Provider Note   CSN: [id number] Arrival date & time: 11/20/21  1038     History  Chief Complaint  Patient presents with   Chest Pain    Derek Reed is a 36 y.o. male.  The history is provided by the patient and medical records. No language interpreter was used.  Chest Pain Pain location:  Substernal area Pain quality: aching   Pain radiates to:  Does not radiate Pain severity:  Mild Onset quality:  Gradual Duration:  1 day Timing:  Constant Progression:  Improving Chronicity:  New Relieved by:  Nothing Worsened by:  Nothing Ineffective treatments:  None tried Associated symptoms: no abdominal pain, no back pain, no cough, no fatigue, no fever, no headache, no lower extremity edema, no nausea, no palpitations, no shortness of breath and no vomiting        Home Medications Prior to Admission medications   Medication Sig Start Date End Date Taking? Authorizing Provider  amLODipine (NORVASC) 10 MG tablet Take 1 tablet (10 mg total) by mouth daily. 11/18/21   Storm Frisk, MD  atorvastatin (LIPITOR) 10 MG tablet Take 1 tablet (10 mg total) by mouth daily. 11/19/21   Storm Frisk, MD  carvedilol (COREG) 12.5 MG tablet Take 1 tablet by mouth 2 times daily with a meal. 10/27/21 10/27/22  Storm Frisk, MD  chlorthalidone (HYGROTON) 25 MG tablet Take 1 tablet (25 mg total) by mouth daily. 11/18/21   Storm Frisk, MD  isosorbide-hydrALAZINE (BIDIL) 20-37.5 MG tablet TAKE 2 TABLETS BY MOUTH 3 (THREE) TIMES DAILY. 10/27/21 10/27/22  Storm Frisk, MD  nitroGLYCERIN (NITROSTAT) 0.4 MG SL tablet Place 1 tablet (0.4 mg total) under the tongue every 5 (five) minutes as needed for chest pain. 10/27/21   Storm Frisk, MD  omeprazole (PRILOSEC) 20 MG capsule Take 1 capsule (20 mg total) by mouth daily. Patient not taking: Reported on 11/18/2021 10/27/21   Storm Frisk, MD  potassium chloride SA (KLOR-CON M) 20 MEQ tablet  Take 1 tablet by mouth 2 times daily. 10/27/21 01/25/22  Storm Frisk, MD  sacubitril-valsartan (ENTRESTO) 97-103 MG TAKE 1 TABLET BY MOUTH 2 (TWO) TIMES DAILY. 11/13/21 11/13/22  Laurey Morale, MD  sacubitril-valsartan (ENTRESTO) 97-103 MG TAKE 1 TABLET BY MOUTH 2 TIMES DAILY. 11/12/21 11/12/22  Storm Frisk, MD  spironolactone (ALDACTONE) 25 MG tablet Take 1 tablet (25 mg total) by mouth daily. 10/27/21 10/27/22  Storm Frisk, MD      Allergies    Patient has no known allergies.    Review of Systems   Review of Systems  Constitutional:  Negative for chills, fatigue and fever.  HENT:  Negative for congestion, ear pain and sore throat.   Eyes:  Negative for pain and visual disturbance.  Respiratory:  Negative for cough and shortness of breath.   Cardiovascular:  Positive for chest pain. Negative for palpitations.  Gastrointestinal:  Negative for abdominal pain, nausea and vomiting.  Genitourinary:  Negative for dysuria, flank pain and hematuria.  Musculoskeletal:  Negative for arthralgias and back pain.  Skin:  Negative for color change and rash.  Neurological:  Negative for seizures, syncope, light-headedness and headaches.  All other systems reviewed and are negative.   Physical Exam Updated Vital Signs BP 120/76   Pulse 62   Temp 97.9 F (36.6 C) (Oral)   Resp 19   SpO2 99%  Physical Exam Vitals and nursing note reviewed.  Constitutional:  General: He is not in acute distress.    Appearance: He is well-developed. He is not ill-appearing, toxic-appearing or diaphoretic.  HENT:     Head: Normocephalic and atraumatic.  Eyes:     Conjunctiva/sclera: Conjunctivae normal.     Pupils: Pupils are equal, round, and reactive to light.  Cardiovascular:     Rate and Rhythm: Normal rate and regular rhythm.     Heart sounds: Normal heart sounds. No murmur heard. Pulmonary:     Effort: Pulmonary effort is normal. No respiratory distress.     Breath sounds: Normal breath  sounds. No wheezing or rales.  Chest:     Chest wall: No tenderness.  Abdominal:     Palpations: Abdomen is soft.     Tenderness: There is no abdominal tenderness.  Musculoskeletal:        General: No swelling.     Cervical back: Neck supple.     Right lower leg: No tenderness. No edema.     Left lower leg: No tenderness. No edema.  Skin:    General: Skin is warm and dry.     Capillary Refill: Capillary refill takes less than 2 seconds.  Neurological:     General: No focal deficit present.     Mental Status: He is alert.  Psychiatric:        Mood and Affect: Mood normal.     ED Results / Procedures / Treatments   Labs (all labs ordered are listed, but only abnormal results are displayed) Labs Reviewed  BASIC METABOLIC PANEL - Abnormal; Notable for the following components:      Result Value   Glucose, Bld 113 (*)    Creatinine, Ser 1.54 (*)    Calcium 8.5 (*)    GFR, Estimated 59 (*)    All other components within normal limits  CBC - Abnormal; Notable for the following components:   Hemoglobin 11.8 (*)    HCT 36.8 (*)    MCV 72.9 (*)    MCH 23.4 (*)    RDW 15.6 (*)    All other components within normal limits  HEPATIC FUNCTION PANEL  LIPASE, BLOOD  BRAIN NATRIURETIC PEPTIDE  TROPONIN I (HIGH SENSITIVITY)  TROPONIN I (HIGH SENSITIVITY)    EKG EKG Interpretation  Date/Time:  Thursday November 20 2021 10:50:54 EDT Ventricular Rate:  66 PR Interval:  186 QRS Duration: 78 QT Interval:  390 QTC Calculation: 408 R Axis:   -23 Text Interpretation: Normal sinus rhythm Minimal voltage criteria for LVH, may be normal variant ( R in aVL ) Borderline ECG When compared with ECG of 20-Nov-2021 10:49, PREVIOUS ECG IS PRESENT when compared to prior, similar appearance to prior. No STEMI Confirmed by Theda Belfast ([id number]) on 11/20/2021 10:54:41 AM  Radiology DG Chest 2 View  Result Date: 11/20/2021 CLINICAL DATA:  Central chest pressure beginning today EXAM: CHEST - 2 VIEW  COMPARISON:  08/12/2021 FINDINGS: Cardiomediastinal silhouette and pulmonary vasculature are within normal limits. Lungs are clear. IMPRESSION: No acute cardiopulmonary process. Electronically Signed   By: Acquanetta Belling M.D.   On: 11/20/2021 11:02    Procedures Procedures    Medications Ordered in ED Medications  alum & mag hydroxide-simeth (MAALOX/MYLANTA) 200-200-20 MG/5ML suspension 30 mL (30 mLs Oral Given 11/20/21 1211)    And  lidocaine (XYLOCAINE) 2 % viscous mouth solution 15 mL (15 mLs Oral Given 11/20/21 1211)    ED Course/ Medical Decision Making/ A&P  Medical Decision Making Amount and/or Complexity of Data Reviewed Labs: ordered. Radiology: ordered.  Risk OTC drugs. Prescription drug management.    Derek Reed is a 37 y.o. male with a past medical history significant for hypertension, CKD, and CHF who presents with chest discomfort.  According to patient, he has had discomfort in his central chest that does not radiate.  It is not pleuritic or exertional.  He reports he did have spicy salsa at [employer] last night and then this morning woke up with the chest discomfort.  It does not feel like the chest pain he has had in the past with his heart failure.  He denies any nausea, vomiting, constipation, diarrhea, or urinary symptoms.  Denies shortness of breath.  Denies diaphoresis or lightheadedness.  He is just complaining about the discomfort.  He reports as a 4 of 10 severity at its worst and is slowly improving.  Denies trauma.  Denies fevers, chills, congestion, or cough.   On my exam, chest is nontender.  Abdomen nontender.  No murmur.  Good pulses in extremities.  Legs nontender and nonedematous.  Vital signs reassuring.  Unremarkable exam otherwise.  EKG does not show STEMI and appears similar to prior.  Based on the patient's report of spicy food last night, worsened this morning when he was laying flat and improving since his been sitting up  I do think that this could be more of a reflux type problem.  Patient says he has medicine to help with stomach acid in the past.  We gave a GI cocktail with resolution of symptoms.  Due to his history of CHF and cardiac disease, we did get delta troponin that was negative, BNP that was normal, and chest x-ray reassuring.  He was monitored for over 4 hours without any recurrent or worsening symptoms and work-up does not show any concerning cardiac findings.  We offered to call cardiology to discuss with them given his history of cardiac disease however given the resolution of symptoms, patient does think that was probably reflux related to his dinner last night and he would rather call them for follow-up.  Given his otherwise well appearance, we agree.  Patient discharged in good condition with understanding return precautions, follow-up instructions, and will call cardiology for further recommendations.  He no other questions or concerns and was discharged in good condition.         Final Clinical Impression(s) / ED Diagnoses Final diagnoses:  Atypical chest pain    Rx / DC Orders ED Discharge Orders     None      Clinical Impression: 1. Atypical chest pain     Disposition: Discharge  Condition: Good  I have discussed the results, Dx and Tx plan with the pt(& family if present). He/she/they expressed understanding and agree(s) with the plan. Discharge instructions discussed at great length. Strict return precautions discussed and pt &/or family have verbalized understanding of the instructions. No further questions at time of discharge.    Discharge Medication List as of 11/20/2021  2:35 PM      Follow Up: Storm Frisk, MD [address] [place] [address] [phone]     [facility] [address] [place] [address] [phone]        Barb Shear, Canary Brim,  MD 11/20/21 [phone]

## 2021-11-20 NOTE — ED Notes (Signed)
Ambulated to bathroom with steady gait

## 2021-11-20 NOTE — Discharge Instructions (Signed)
Your history, exam, work-up today are suggestive of a more esophageal/stomach/reflux related discomfort in your chest.  Your cardiac enzymes, troponins, were negative both times we checked them and your heart failure test, BNP, was the best it has been in years.  Your chest x-ray was reassuring and your EKG appeared unchanged from prior.  The spicy salsa you ate last night and symptoms in the morning when he woke up are suggestive of it being from a reflux type source and the fact that the GI cocktail helped is also reassuring.  Please take your reflux medicines that you have had in the past and please follow-up with your primary doctor and your cardiologist.  We had a shared decision-making conversation offering consultation with cardiology given your history and chest discomfort however, given your improved symptoms we agree with you that you appear safe for discharge home at this time.  If any symptoms are to change or worsen, please return to the nearest Emergency Department.

## 2021-11-20 NOTE — ED Triage Notes (Signed)
Patient presents to ED via POV from home. Reports central chest pressure that began 45 minutes PTA. History of HTN and heart failure. Denies pain radiation. Denies SOB, nausea, vomiting.

## 2021-11-20 NOTE — ED Notes (Signed)
Chest pain improved.  Pt resting quietly

## 2021-11-23 ENCOUNTER — Encounter (HOSPITAL_COMMUNITY): Payer: Self-pay

## 2021-11-23 ENCOUNTER — Other Ambulatory Visit: Payer: Self-pay

## 2021-11-23 ENCOUNTER — Emergency Department (HOSPITAL_COMMUNITY)
Admission: EM | Admit: 2021-11-23 | Discharge: 2021-11-23 | Disposition: A | Discharge status: Home / Self Care | Payer: BC Managed Care – PPO | Attending: Emergency Medicine | Admitting: Emergency Medicine

## 2021-11-23 DIAGNOSIS — R002 Palpitations: Secondary | ICD-10-CM | POA: Insufficient documentation

## 2021-11-23 DIAGNOSIS — I509 Heart failure, unspecified: Secondary | ICD-10-CM | POA: Diagnosis not present

## 2021-11-23 DIAGNOSIS — I10 Essential (primary) hypertension: Secondary | ICD-10-CM | POA: Diagnosis not present

## 2021-11-23 DIAGNOSIS — Z79899 Other long term (current) drug therapy: Secondary | ICD-10-CM | POA: Diagnosis not present

## 2021-11-23 DIAGNOSIS — I110 Hypertensive heart disease with heart failure: Secondary | ICD-10-CM | POA: Insufficient documentation

## 2021-11-23 DIAGNOSIS — R079 Chest pain, unspecified: Secondary | ICD-10-CM | POA: Diagnosis not present

## 2021-11-23 DIAGNOSIS — R0789 Other chest pain: Secondary | ICD-10-CM | POA: Diagnosis not present

## 2021-11-23 LAB — I-STAT CHEM 8, ED
BUN: 13 mg/dL (ref 6–20)
Calcium, Ion: 1.24 mmol/L (ref 1.15–1.40)
Chloride: 106 mmol/L (ref 98–111)
Creatinine, Ser: 1.4 mg/dL — ABNORMAL HIGH (ref 0.61–1.24)
Glucose, Bld: 128 mg/dL — ABNORMAL HIGH (ref 70–99)
HCT: 43 % (ref 39.0–52.0)
Hemoglobin: 14.6 g/dL (ref 13.0–17.0)
Potassium: 4.5 mmol/L (ref 3.5–5.1)
Sodium: 140 mmol/L (ref 135–145)
TCO2: 24 mmol/L (ref 22–32)

## 2021-11-23 NOTE — ED Provider Triage Note (Signed)
Emergency Medicine Provider Triage Evaluation Note  Derek Reed , a 36 y.o. male  was evaluated in triage.  Pt complains of palpitations onset tonight. Symptoms spontaneously improving. Had similar symptoms a few days ago and was seen at [facility] with reassuring evaluation. Had chest pain then, but denies chest pain today. No fevers, vomiting, syncope.  Review of Systems  Positive: As above Negative: As above  Physical Exam  BP (!) 141/101 (BP Location: Right Arm)   Pulse 65   Temp 97.9 F (36.6 C)   Resp 16   SpO2 98%  Gen:   Awake, no distress   Resp:  Normal effort  MSK:   Moves extremities without difficulty  Other:  Heart RRR  Medical Decision Making  Medically screening exam initiated at 3:15 AM.  Appropriate orders placed.  Derek Reed was informed that the remainder of the evaluation will be completed by another provider, this initial triage assessment does not replace that evaluation, and the importance of remaining in the ED until their evaluation is complete.  Palpitations - will check electrolytes, EKG. Was assessed for similar symptoms 3 days prior with reassuring work up.   Antony Madura, PA-C 11/23/21 3785

## 2021-11-23 NOTE — Discharge Instructions (Addendum)
Please continue taking all home medications as prescribed Please return to the emergency department for reevaluation if you have symptoms that do not resolve after several minutes or new symptoms such as shortness of breath or pain

## 2021-11-23 NOTE — ED Provider Notes (Signed)
[facility] EMERGENCY DEPARTMENT Provider Note   CSN: [id number] Arrival date & time: 11/23/21  0301     History  Chief Complaint  Patient presents with   Palpitations    Derek Reed is a 36 y.o. male.  HPI 36 year old male history of congestive heart failure, hypertension, presents today with palpitations.  Patient states that last night when he is going to sleep he felt like he had irregular heartbeats.  He did not have any associated pain or dyspnea.  These have resolved and lasted only a few minutes.  He presented last night due to this.  Review of record reveals that patient had CHF in 2020 with reduced EF 25 to 30%.  Follow-up EF on echo in January 21 should significant improvement with EF up to 55 to 60% after patient had been treating with BP medications.  [facility] Delford Field Denies significant caffeine use Denies any illicit drugs Is not a smoker and does not drink alcohol Does work in the heat but states that he tries to stay hydrated. This occurred after work and he did not feel that he had any significant heat exhaustion component     Home Medications Prior to Admission medications   Medication Sig Start Date End Date Taking? Authorizing Provider  amLODipine (NORVASC) 10 MG tablet Take 1 tablet (10 mg total) by mouth daily. 11/18/21   Storm Frisk, MD  atorvastatin (LIPITOR) 10 MG tablet Take 1 tablet (10 mg total) by mouth daily. 11/19/21   Storm Frisk, MD  carvedilol (COREG) 12.5 MG tablet Take 1 tablet by mouth 2 times daily with a meal. 10/27/21 10/27/22  Storm Frisk, MD  chlorthalidone (HYGROTON) 25 MG tablet Take 1 tablet (25 mg total) by mouth daily. 11/18/21   Storm Frisk, MD  isosorbide-hydrALAZINE (BIDIL) 20-37.5 MG tablet TAKE 2 TABLETS BY MOUTH 3 (THREE) TIMES DAILY. 10/27/21 10/27/22  Storm Frisk, MD  nitroGLYCERIN (NITROSTAT) 0.4 MG SL tablet Place 1 tablet (0.4 mg  total) under the tongue every 5 (five) minutes as needed for chest pain. 10/27/21   Storm Frisk, MD  omeprazole (PRILOSEC) 20 MG capsule Take 1 capsule (20 mg total) by mouth daily. Patient not taking: Reported on 11/18/2021 10/27/21   Storm Frisk, MD  potassium chloride SA (KLOR-CON M) 20 MEQ tablet Take 1 tablet by mouth 2 times daily. 10/27/21 01/25/22  Storm Frisk, MD  sacubitril-valsartan (ENTRESTO) 97-103 MG TAKE 1 TABLET BY MOUTH 2 (TWO) TIMES DAILY. 11/13/21 11/13/22  Laurey Morale, MD  sacubitril-valsartan (ENTRESTO) 97-103 MG TAKE 1 TABLET BY MOUTH 2 TIMES DAILY. 11/12/21 11/12/22  Storm Frisk, MD  spironolactone (ALDACTONE) 25 MG tablet Take 1 tablet (25 mg total) by mouth daily. 10/27/21 10/27/22  Storm Frisk, MD      Allergies    Patient has no known allergies.    Review of Systems   Review of Systems  Physical Exam Updated Vital Signs BP 129/84 (BP Location: Right Arm)   Pulse 63   Temp 97.9 F (36.6 C) (Oral)   Resp 16   SpO2 98%  Physical Exam Vitals and nursing note reviewed.  Constitutional:      General: He is not in acute distress.    Appearance: Normal appearance. He is obese. He is not ill-appearing.  HENT:     Head: Normocephalic.     Right Ear: External ear normal.  Left Ear: External ear normal.     Nose: Nose normal.     Mouth/Throat:     Mouth: Mucous membranes are moist.  Eyes:     Pupils: Pupils are equal, round, and reactive to light.  Cardiovascular:     Rate and Rhythm: Normal rate and regular rhythm.     Pulses: Normal pulses.  Pulmonary:     Effort: Pulmonary effort is normal.     Breath sounds: Normal breath sounds.  Abdominal:     General: Abdomen is flat. Bowel sounds are normal.     Palpations: Abdomen is soft.  Musculoskeletal:        General: No swelling. Normal range of motion.     Cervical back: Normal range of motion.     Right lower leg: No edema.     Left lower leg: No edema.  Skin:    Capillary  Refill: Capillary refill takes less than 2 seconds.  Neurological:     General: No focal deficit present.     Mental Status: He is alert.  Psychiatric:        Mood and Affect: Mood normal.     ED Results / Procedures / Treatments   Labs (all labs ordered are listed, but only abnormal results are displayed) Labs Reviewed  I-STAT CHEM 8, ED - Abnormal; Notable for the following components:      Result Value   Creatinine, Ser 1.40 (*)    Glucose, Bld 128 (*)    All other components within normal limits    EKG EKG Interpretation  Date/Time:  Sunday November 23 2021 03:14:05 EDT Ventricular Rate:  69 PR Interval:  184 QRS Duration: 80 QT Interval:  366 QTC Calculation: 392 R Axis:   -32 Text Interpretation: Normal sinus rhythm Left axis deviation Minimal voltage criteria for LVH, may be normal variant ( R in aVL ) Abnormal ECG When compared with ECG of 20-Nov-2021 10:50, No significant change was found Confirmed by Dione Booze ([id number]) on 11/23/2021 3:41:23 AM  Radiology No results found.  Procedures Procedures    Medications Ordered in ED Medications - No data to display  ED Course/ Medical Decision Making/ A&P Clinical Course as of 11/23/21 1533  Sun Nov 23, 2021  1417 EKG reviewed interpreted no evidence of acute abnormality [DR]  1417 I-STAT 8 reviewed interpreted creatinine elevated at 1.4 but stable from prior [DR]    Clinical Course User Index [DR] Margarita Grizzle, MD                           Medical Decision Making 36 year old male history of CHF, hypertension, presents today with palpitations.  That is lasted a short period of time last night and has since resolved. Differential diagnosis includes but is not limited to PVCs and other cardiac arrhythmias, anemia, other metabolic abnormality.  EKG here shows some LVH but no evidence of acute abnormalities Labs here reveal no evidence of acute anemia, electrolytes appear within normal limits EKG with normal sinus  rhythm Discussed diet and hydration status We have discussed return precautions need for follow-up patient voices understanding   Amount and/or Complexity of Data Reviewed Labs: ordered. Decision-making details documented in ED Course. ECG/medicine tests: ordered. Decision-making details documented in ED Course.           Final Clinical Impression(s) / ED Diagnoses Final diagnoses:  Palpitations    Rx / DC Orders ED Discharge Orders  None         Margarita Grizzle, MD 11/23/21 [phone]

## 2021-11-23 NOTE — ED Notes (Signed)
PT called X3 for vitals no response 

## 2021-11-23 NOTE — ED Triage Notes (Signed)
Pt comes via [facility] for palpations, no SOB or n/v, had similar episode the other day.

## 2021-11-24 ENCOUNTER — Encounter: Payer: Self-pay | Admitting: Critical Care Medicine

## 2021-11-24 ENCOUNTER — Telehealth: Payer: Self-pay | Admitting: Critical Care Medicine

## 2021-11-24 NOTE — Telephone Encounter (Signed)
Patient just seen recently on 11 July has been to the ER twice since then for atypical chest pain and palpitations stays anxious and nervous at all times does have established cardiac disease  Would benefit from counseling on his anxiety

## 2021-11-25 ENCOUNTER — Encounter: Payer: Self-pay | Admitting: Critical Care Medicine

## 2021-11-26 NOTE — Telephone Encounter (Signed)
Called pt left brief message requesting a call back.

## 2021-12-02 ENCOUNTER — Ambulatory Visit (INDEPENDENT_AMBULATORY_CARE_PROVIDER_SITE_OTHER): Payer: BC Managed Care – PPO | Admitting: Cardiovascular Disease

## 2021-12-02 ENCOUNTER — Ambulatory Visit (INDEPENDENT_AMBULATORY_CARE_PROVIDER_SITE_OTHER): Payer: BC Managed Care – PPO

## 2021-12-02 ENCOUNTER — Encounter: Payer: Self-pay | Admitting: Cardiovascular Disease

## 2021-12-02 VITALS — BP 102/76 | HR 68 | Ht 66.0 in | Wt 244.2 lb

## 2021-12-02 DIAGNOSIS — I10 Essential (primary) hypertension: Secondary | ICD-10-CM | POA: Diagnosis not present

## 2021-12-02 DIAGNOSIS — N1831 Chronic kidney disease, stage 3a: Secondary | ICD-10-CM | POA: Diagnosis not present

## 2021-12-02 DIAGNOSIS — I5032 Chronic diastolic (congestive) heart failure: Secondary | ICD-10-CM

## 2021-12-02 DIAGNOSIS — R002 Palpitations: Secondary | ICD-10-CM | POA: Diagnosis not present

## 2021-12-02 NOTE — Progress Notes (Unsigned)
Enrolled for [employer] to mail a ZIO XT long term holter monitor to the patients address on file.  

## 2021-12-02 NOTE — Progress Notes (Signed)
Cardiology Office Note:    Date:  12/05/2021   ID:  Derek Reed, DOB 1986/01/30, MRN [id number]  PCP:  Derek Stain, MD   [facility] Cardiologist:  Derek Crow, MD Click to update primary MD,subspecialty MD or APP then REFRESH:1}    Referring MD: Derek Stain, MD   Chief Complaint  Patient presents with   Palpitations         History of Present Illness:    Derek Reed is a 36 y.o. male with a hx of chronic heart failure with previous severely depressed left ventricular systolic function (EF 123XX123) in the setting of nonischemic cardiomyopathy due to untreated severe hypertension.  Normal coronaries by previous cardiac catheterization and calcium score 0 by CT.  He had a remarkable improvement in LVEF to 55 to 60% by most recent echo in October 2022) once blood pressure was well controlled.  Additional problems include CKD stage III, most recent creatinine 1.40 and prediabetes (hemoglobin A1c 6.2% in the past)  He has been recently troubled by recurrent palpitations associated with chest discomfort and it that have led to a couple of emergency room evaluations on July 13 and July 16 of this year.  The palpitations often will wake him up in the middle of the night.  He feels that his heart is beating fast, but regular.  He feels flushed but has not experienced syncope.  Tachycardia resolves gradually.  He has had 2 more episodes since his last emergency room evaluation.  The symptoms are not triggered by activity.  He has had some atypical chest discomfort while he was at work, but this occurs whether he is physically active or at rest.  He operates a Forensic scientist.  He denies shortness of breath at rest or with activity.  He does not have orthopnea, PND or edema.  He denies any focal neurological complaints.  He denies the use of excessive caffeine or recreational stimulants and does not drink alcohol.  On both occasions when he went to the emergency room he had  normal blood pressure and heart rate.  His ECG showed normal sinus rhythm.  No arrhythmia was reported while he was on telemetry.  BNP, troponin and chest x-ray were normal.  The first time, the symptoms were attributed to a spicy meal late in the evening and his symptoms appear to improve when he received a GI cocktail.  During the second ED visit the complaints of palpitations were more prominent than those of chest pain.  Current medical therapy involves high-dose hydralazine-nitrates, maximum dose Entresto, spironolactone and carvedilol as well as chlorthalidone and amlodipine.  He reports compliance with all his medications.  Past Medical History:  Diagnosis Date   Acute CHF (congestive heart failure) ([place]) 02/24/2019   Anxiety    Hypertension    Obesity    Peritonsillar abscess     Past Surgical History:  Procedure Laterality Date   INCISION AND DRAINAGE OF PERITONSILLAR ABCESS     RIGHT/LEFT HEART CATH AND CORONARY ANGIOGRAPHY N/A 02/24/2019   Procedure: RIGHT/LEFT HEART CATH AND CORONARY ANGIOGRAPHY;  Surgeon: Derek Dresser, MD;  Location: [facility];  Service: Cardiovascular;  Laterality: N/A;    Current Medications: Current Meds  Medication Sig   amLODipine (NORVASC) 10 MG tablet Take 1 tablet (10 mg total) by mouth daily.   atorvastatin (LIPITOR) 10 MG tablet Take 1 tablet (10 mg total) by mouth daily.   carvedilol (COREG) 12.5 MG tablet Take 1 tablet by  mouth 2 times daily with a meal.   chlorthalidone (HYGROTON) 25 MG tablet Take 1 tablet (25 mg total) by mouth daily.   isosorbide-hydrALAZINE (BIDIL) 20-37.5 MG tablet TAKE 2 TABLETS BY MOUTH 3 (THREE) TIMES DAILY.   potassium chloride SA (KLOR-CON M) 20 MEQ tablet Take 1 tablet by mouth 2 times daily.   sacubitril-valsartan (ENTRESTO) 97-103 MG TAKE 1 TABLET BY MOUTH 2 (TWO) TIMES DAILY.   spironolactone (ALDACTONE) 25 MG tablet Take 1 tablet (25 mg total) by mouth daily.     Allergies:   Patient has no known  allergies.   Social History   Socioeconomic History   Marital status: Married    Spouse name: Not on file   Number of children: Not on file   Years of education: Not on file   Highest education level: Not on file  Occupational History   Not on file  Tobacco Use   Smoking status: Former   Smokeless tobacco: Never  Vaping Use   Vaping Use: Never used  Substance and Sexual Activity   Alcohol use: No   Drug use: No   Sexual activity: Not on file  Other Topics Concern   Not on file  Social History Narrative   Not on file   Social Determinants of Health   Financial Resource Strain: Not on file  Food Insecurity: Not on file  Transportation Needs: Not on file  Physical Activity: Not on file  Stress: Not on file  Social Connections: Not on file     Family History: The patient's family history includes Diabetes in his mother; Hyperlipidemia in his mother; Hypertension in his father and mother; Stroke in his mother.  ROS:   Please see the history of present illness.     All other systems reviewed and are negative.  EKGs/Labs/Other Studies Reviewed:    The following studies were reviewed today: Notes, labs, chest x-ray, ECGs performed during ER visits 07 13 and 07 16  Echocardiogram 03/06/2021  1. Left ventricular ejection fraction, by estimation, is 55 to 60%. The  left ventricle has normal function. The left ventricle has no regional  wall motion abnormalities. Left ventricular diastolic parameters were  normal.   2. Right ventricular systolic function is normal. The right ventricular  size is normal. Tricuspid regurgitation signal is inadequate for assessing  PA pressure.   3. The mitral valve is normal in structure. No evidence of mitral valve  regurgitation. No evidence of mitral stenosis.   4. The aortic valve is tricuspid. Aortic valve regurgitation is not  visualized. No aortic stenosis is present.   5. The inferior vena cava is normal in size with greater than  50%  respiratory variability, suggesting right atrial pressure of 3 mmHg.   EKG:  EKG is  ordered today.  The ekg ordered today demonstrates normal sinus rhythm, left axis deviation, LVH with voltage masked by obesity, nonspecific T wave changes.  QTc 408 ms.  Recent Labs: 11/20/2021: ALT 15; B Natriuretic Peptide 25.5; Platelets 264 11/23/2021: BUN 13; Creatinine, Ser 1.40; Hemoglobin 14.6; Potassium 4.5; Sodium 140  Recent Lipid Panel    Component Value Date/Time   CHOL 162 11/18/2021 0907   TRIG 143 11/18/2021 0907   HDL 33 (L) 11/18/2021 0907   CHOLHDL 4.9 11/18/2021 0907   CHOLHDL 5.9 02/24/2019 0338   VLDL 13 02/24/2019 0338   LDLCALC 103 (H) 11/18/2021 0907     Risk Assessment/Calculations:           Physical  Exam:    VS:  BP 102/76 (BP Location: Left Arm, Patient Position: Sitting, Cuff Size: Large)   Pulse 68   Ht 5\' 6"  (1.676 m)   Wt 244 lb 3.2 oz (110.8 kg)   SpO2 97%   BMI 39.41 kg/m     Wt Readings from Last 3 Encounters:  12/02/21 244 lb 3.2 oz (110.8 kg)  11/18/21 251 lb 3.2 oz (113.9 kg)  08/12/21 250 lb 9.6 oz (113.7 kg)     GEN: Severely obese, but also appears fit; well nourished, well developed in no acute distress HEENT: Normal NECK: No JVD; No carotid bruits LYMPHATICS: No lymphadenopathy CARDIAC: RRR, no murmurs, rubs, gallops RESPIRATORY:  Clear to auscultation without rales, wheezing or rhonchi  ABDOMEN: Soft, non-tender, non-distended MUSCULOSKELETAL:  No edema; No deformity  SKIN: Warm and dry NEUROLOGIC:  Alert and oriented x 3 PSYCHIATRIC:  Normal affect   ASSESSMENT:    1. Chronic diastolic heart failure ([place])   2. Primary hypertension   3. Palpitations   4. Stage 3a chronic kidney disease ([place])   5. Severe obesity (BMI 35.0-39.9) with comorbidity ([place])    PLAN:    In order of problems listed above:  CHF: Appears clinically euvolemic. BNP low.  No real symptoms of heart failure.  EF normalized by most recent echocardiogram.   On comprehensive medical therapy and with well-controlled blood pressure. HTN: Very well controlled, but requiring 6 or 7 pharmacological agents . Palpitations: Unsure if he is describing a true arrhythmia.  The gradual resolution of palpitations suggests resolving sinus tachycardia.  He could be having atrial fibrillation.  I wonder whether he is very dehydrated since he is working in a very hot environment.  Encouraged more fluid intake.  He is taking great medications with diuretic effect (sacubitril, spironolactone, chlorthalidone).  We will place an event monitor.  I also encouraged him to purchase a commercially available rhythm monitor such as a smart watch or Kardia device, since we may not catch any symptomatic events from the limited-time event monitor. CKD 3A: Stable renal parameters. Obesity: Would benefit from weight loss.  His weight is lower than in the past, making it unlikely that this is heart failure exacerbation.           Medication Adjustments/Labs and Tests Ordered: Current medicines are reviewed at length with the patient today.  Concerns regarding medicines are outlined above.  Orders Placed This Encounter  Procedures   LONG TERM MONITOR (3-14 DAYS)   EKG 12-Lead   No orders of the defined types were placed in this encounter.   Patient Instructions  Medication Instructions:  No changes *If you need a refill on your cardiac medications before your next appointment, please call your pharmacy*   Lab Work: None ordered If you have labs (blood work) drawn today and your tests are completely normal, you will receive your results only by: [place] (if you have MyChart) OR A paper copy in the mail If you have any lab test that is abnormal or we need to change your treatment, we will call you to review the results.   Testing/Procedures: None ordered   Follow-Up: At [facility], you and your health needs are our priority.  As part of our continuing  mission to provide you with exceptional heart care, we have created designated Provider Care Teams.  These Care Teams include your primary Cardiologist (physician) and Advanced Practice Providers (APPs -  Physician Assistants and Nurse Practitioners) who all work together  to provide you with the care you need, when you need it.  We recommend signing up for the patient portal called "MyChart".  Sign up information is provided on this After Visit Summary.  MyChart is used to connect with patients for Virtual Visits (Telemedicine).  Patients are able to view lab/test results, encounter notes, upcoming appointments, etc.  Non-urgent messages can be sent to your provider as well.   To learn more about what you can do with MyChart, go to [email or web address].    Your next appointment:   Follow up as planned with the Heart Failure Clinic   [facility] [employer]: Website: [email or web address]  DR. Royann Shivers RECOMMENDS YOU PURCHASE  " Kardia" [employer] FROM THE  [employer]  APP PLAY STORE.  THE APP IS FREE , BUT THE  EQUIPMENT HAS A COST. IT ALLOWS YOU TO OBTAIN A RECORDING OF YOUR HEART RATE AND RHYTHM BY PROVIDING A SHORT STRIP THAT YOU CAN SHARE WITH YOUR PROVIDER.     [facility] - sending an EKG Download app and set up profile. Run EKG - by placing 1-2 fingers on the silver plates After EKG is complete - Download PDF  - Skip password (if you apply a password the provider will need it to view the EKG) Click share button (square with upward arrow) in bottom left corner To send: choose MyChart (first time log into MyChart)  Pop up window about sending ECG Click continue Choose type of message Choose provider Type subject and message Click send (EKG should be attached)  - To send additional EKGs in one message click the paperclip image and bottom of page to attach.       Other Instructions ZIO XT- Long Term Monitor Instructions  Your physician has requested you  wear a ZIO patch monitor for 14 days.  This is a single patch monitor. [employer] supplies one patch monitor per enrollment. Additional stickers are not available. Please do not apply patch if you will be having a Nuclear Stress Test,  Echocardiogram, Cardiac CT, MRI, or Chest Xray during the period you would be wearing the  monitor. The patch cannot be worn during these tests. You cannot remove and re-apply the  ZIO XT patch monitor.  Your ZIO patch monitor will be mailed 3 day USPS to your address on file. It may take 3-5 days  to receive your monitor after you have been enrolled.  Once you have received your monitor, please review the enclosed instructions. Your monitor  has already been registered assigning a specific monitor serial # to you.  Billing and Patient Assistance Program Information  We have supplied [employer] with any of your insurance information on file for billing purposes. [employer] offers a sliding scale Patient Assistance Program for patients that do not have  insurance, or whose insurance does not completely cover the cost of the ZIO monitor.  You must apply for the Patient Assistance Program to qualify for this discounted rate.  To apply, please call [employer] at [phone], select option 4, select option 2, ask to apply for  Patient Assistance Program. Meredeth Ide will ask your household income, and how many people  are in your household. They will quote your out-of-pocket cost based on that information.  [employer] will also be able to set up a 42-month, interest-free payment plan if needed.  Applying the monitor   Shave hair from upper left chest.  Hold abrader disc by orange tab. Rub abrader in 40 strokes over the upper left chest  as  indicated in your monitor instructions.  Clean area with 4 enclosed alcohol pads. Let dry.  Apply patch as indicated in monitor instructions. Patch will be placed under collarbone on left  side of chest with arrow pointing upward.  Rub patch  adhesive wings for 2 minutes. Remove white label marked "1". Remove the white  label marked "2". Rub patch adhesive wings for 2 additional minutes.  While looking in a mirror, press and release button in center of patch. A small green light will  flash 3-4 times. This will be your only indicator that the monitor has been turned on.  Do not shower for the first 24 hours. You may shower after the first 24 hours.  Press the button if you feel a symptom. You will hear a small click. Record Date, Time and  Symptom in the Patient Logbook.  When you are ready to remove the patch, follow instructions on the last 2 pages of Patient  Logbook. Stick patch monitor onto the last page of Patient Logbook.  Place Patient Logbook in the blue and white box. Use locking tab on box and tape box closed  securely. The blue and white box has prepaid postage on it. Please place it in the mailbox as  soon as possible. Your physician should have your test results approximately 7 days after the  monitor has been mailed back to [facility].  Call [facility] Customer Care at [phone] if you have questions regarding  your ZIO XT patch monitor. Call them immediately if you see an orange light blinking on your  monitor.  If your monitor falls off in less than 4 days, contact our Monitor department at [phone].  If your monitor becomes loose or falls off after 4 days call [employer] at [phone] for  suggestions on securing your monitor       Signed, Thurmon Fair, MD  12/05/2021 5:28 PM    [facility]

## 2021-12-02 NOTE — Patient Instructions (Signed)
Medication Instructions:  No changes *If you need a refill on your cardiac medications before your next appointment, please call your pharmacy*   Lab Work: None ordered If you have labs (blood work) drawn today and your tests are completely normal, you will receive your results only by: MyChart Message (if you have MyChart) OR A paper copy in the mail If you have any lab test that is abnormal or we need to change your treatment, we will call you to review the results.   Testing/Procedures: None ordered   Follow-Up: At [facility], you and your health needs are our priority.  As part of our continuing mission to provide you with exceptional heart care, we have created designated Provider Care Teams.  These Care Teams include your primary Cardiologist (physician) and Advanced Practice Providers (APPs -  Physician Assistants and Nurse Practitioners) who all work together to provide you with the care you need, when you need it.  We recommend signing up for the patient portal called "MyChart".  Sign up information is provided on this After Visit Summary.  MyChart is used to connect with patients for Virtual Visits (Telemedicine).  Patients are able to view lab/test results, encounter notes, upcoming appointments, etc.  Non-urgent messages can be sent to your provider as well.   To learn more about what you can do with MyChart, go to [email or web address].    Your next appointment:   Follow up as planned with the Heart Failure Clinic   [facility] [employer]: Website: [email or web address]  DR. Royann Shivers RECOMMENDS YOU PURCHASE  " Kardia" By [employer] FROM THE  [employer]  APP PLAY STORE.  THE APP IS FREE , BUT THE  EQUIPMENT HAS A COST. IT ALLOWS YOU TO OBTAIN A RECORDING OF YOUR HEART RATE AND RHYTHM BY PROVIDING A SHORT STRIP THAT YOU CAN SHARE WITH YOUR PROVIDER.     [facility] - sending an EKG Download app and set up profile. Run EKG - by placing 1-2  fingers on the silver plates After EKG is complete - Download PDF  - Skip password (if you apply a password the provider will need it to view the EKG) Click share button (square with upward arrow) in bottom left corner To send: choose MyChart (first time log into MyChart)  Pop up window about sending ECG Click continue Choose type of message Choose provider Type subject and message Click send (EKG should be attached)  - To send additional EKGs in one message click the paperclip image and bottom of page to attach.       Other Instructions ZIO XT- Long Term Monitor Instructions  Your physician has requested you wear a ZIO patch monitor for 14 days.  This is a single patch monitor. [employer] supplies one patch monitor per enrollment. Additional stickers are not available. Please do not apply patch if you will be having a Nuclear Stress Test,  Echocardiogram, Cardiac CT, MRI, or Chest Xray during the period you would be wearing the  monitor. The patch cannot be worn during these tests. You cannot remove and re-apply the  ZIO XT patch monitor.  Your ZIO patch monitor will be mailed 3 day USPS to your address on file. It may take 3-5 days  to receive your monitor after you have been enrolled.  Once you have received your monitor, please review the enclosed instructions. Your monitor  has already been registered assigning a specific monitor serial # to you.  Billing and Patient Assistance Program Information  We have supplied [employer] with any of your insurance information on file for billing purposes. [employer] offers a sliding scale Patient Assistance Program for patients that do not have  insurance, or whose insurance does not completely cover the cost of the ZIO monitor.  You must apply for the Patient Assistance Program to qualify for this discounted rate.  To apply, please call [employer] at [phone], select option 4, select option 2, ask to apply for  Patient Assistance Program.  Meredeth Ide will ask your household income, and how many people  are in your household. They will quote your out-of-pocket cost based on that information.  [employer] will also be able to set up a 13-month, interest-free payment plan if needed.  Applying the monitor   Shave hair from upper left chest.  Hold abrader disc by orange tab. Rub abrader in 40 strokes over the upper left chest as  indicated in your monitor instructions.  Clean area with 4 enclosed alcohol pads. Let dry.  Apply patch as indicated in monitor instructions. Patch will be placed under collarbone on left  side of chest with arrow pointing upward.  Rub patch adhesive wings for 2 minutes. Remove white label marked "1". Remove the white  label marked "2". Rub patch adhesive wings for 2 additional minutes.  While looking in a mirror, press and release button in center of patch. A small green light will  flash 3-4 times. This will be your only indicator that the monitor has been turned on.  Do not shower for the first 24 hours. You may shower after the first 24 hours.  Press the button if you feel a symptom. You will hear a small click. Record Date, Time and  Symptom in the Patient Logbook.  When you are ready to remove the patch, follow instructions on the last 2 pages of Patient  Logbook. Stick patch monitor onto the last page of Patient Logbook.  Place Patient Logbook in the blue and white box. Use locking tab on box and tape box closed  securely. The blue and white box has prepaid postage on it. Please place it in the mailbox as  soon as possible. Your physician should have your test results approximately 7 days after the  monitor has been mailed back to [facility].  Call [facility] Customer Care at [phone] if you have questions regarding  your ZIO XT patch monitor. Call them immediately if you see an orange light blinking on your  monitor.  If your monitor falls off in less than 4 days, contact our Monitor  department at [phone].  If your monitor becomes loose or falls off after 4 days call [employer] at [phone] for  suggestions on securing your monitor

## 2021-12-05 ENCOUNTER — Encounter: Payer: Self-pay | Admitting: Cardiovascular Disease

## 2021-12-05 DIAGNOSIS — R002 Palpitations: Secondary | ICD-10-CM | POA: Diagnosis not present

## 2021-12-17 ENCOUNTER — Other Ambulatory Visit (HOSPITAL_COMMUNITY): Payer: Self-pay

## 2022-01-12 ENCOUNTER — Other Ambulatory Visit (HOSPITAL_COMMUNITY): Payer: Self-pay

## 2022-01-13 ENCOUNTER — Other Ambulatory Visit (HOSPITAL_COMMUNITY): Payer: Self-pay

## 2022-02-09 ENCOUNTER — Other Ambulatory Visit: Payer: Self-pay | Admitting: Critical Care Medicine

## 2022-02-09 ENCOUNTER — Other Ambulatory Visit (HOSPITAL_COMMUNITY): Payer: Self-pay

## 2022-02-10 NOTE — Telephone Encounter (Signed)
Refilled 10/27/2021 #180 1 rf. Requested Prescriptions  Pending Prescriptions Disp Refills  . potassium chloride SA (KLOR-CON M) 20 MEQ tablet 60 tablet 0    Sig: Take 1 tablet by mouth 2 times daily.     Endocrinology:  Minerals - Potassium Supplementation Failed - 02/09/2022  2:34 PM      Failed - Cr in normal range and within 360 days    Creat  Date Value Ref Range Status  03/26/2014 1.21 0.50 - 1.35 mg/dL Final   Creatinine, Ser  Date Value Ref Range Status  11/23/2021 1.40 (H) 0.61 - 1.24 mg/dL Final         Passed - K in normal range and within 360 days    Potassium  Date Value Ref Range Status  11/23/2021 4.5 3.5 - 5.1 mmol/L Final         Passed - Valid encounter within last 12 months    Recent Outpatient Visits          2 months ago Chronic systolic heart failure (HCC)due to Hypertension   [place] Elsie Stain, MD   10 months ago Primary hypertension   [place] Elsie Stain, MD   1 year ago Chronic systolic heart failure (HCC)due to Hypertension   [place] Elsie Stain, MD   1 year ago Chronic systolic heart failure (HCC)due to Hypertension   [place] Elsie Stain, MD   1 year ago Chronic systolic heart failure (HCC)due to Hypertension   [place] Elsie Stain, MD

## 2022-02-12 ENCOUNTER — Other Ambulatory Visit: Payer: Self-pay | Admitting: Critical Care Medicine

## 2022-02-12 ENCOUNTER — Other Ambulatory Visit (HOSPITAL_COMMUNITY): Payer: Self-pay

## 2022-02-12 MED ORDER — POTASSIUM CHLORIDE CRYS ER 20 MEQ PO TBCR
20.0000 meq | EXTENDED_RELEASE_TABLET | Freq: Two times a day (BID) | ORAL | 0 refills | Status: DC
  Filled 2022-02-12 – 2022-05-10 (×2): qty 60, 30d supply, fill #0

## 2022-03-19 ENCOUNTER — Other Ambulatory Visit (HOSPITAL_COMMUNITY): Payer: Self-pay

## 2022-03-20 ENCOUNTER — Telehealth (HOSPITAL_COMMUNITY): Payer: Self-pay

## 2022-03-20 MED ORDER — ENTRESTO 97-103 MG PO TABS: 1.0000 | ORAL_TABLET | Freq: Two times a day (BID) | ORAL | 0 refills | Status: DC

## 2022-03-23 NOTE — Telephone Encounter (Signed)
Open error 

## 2022-04-21 ENCOUNTER — Other Ambulatory Visit: Payer: Self-pay | Admitting: Critical Care Medicine

## 2022-04-27 ENCOUNTER — Other Ambulatory Visit: Payer: Self-pay | Admitting: Critical Care Medicine

## 2022-05-08 ENCOUNTER — Emergency Department (HOSPITAL_BASED_OUTPATIENT_CLINIC_OR_DEPARTMENT_OTHER)
Admission: EM | Admit: 2022-05-08 | Discharge: 2022-05-08 | Disposition: A | Discharge status: Home / Self Care | Payer: BC Managed Care – PPO | Attending: Emergency Medicine | Admitting: Emergency Medicine

## 2022-05-08 ENCOUNTER — Other Ambulatory Visit: Payer: Self-pay

## 2022-05-08 ENCOUNTER — Encounter (HOSPITAL_BASED_OUTPATIENT_CLINIC_OR_DEPARTMENT_OTHER): Payer: Self-pay | Admitting: Pediatrics

## 2022-05-08 DIAGNOSIS — I509 Heart failure, unspecified: Secondary | ICD-10-CM | POA: Diagnosis not present

## 2022-05-08 DIAGNOSIS — I130 Hypertensive heart and chronic kidney disease with heart failure and stage 1 through stage 4 chronic kidney disease, or unspecified chronic kidney disease: Secondary | ICD-10-CM | POA: Insufficient documentation

## 2022-05-08 DIAGNOSIS — J101 Influenza due to other identified influenza virus with other respiratory manifestations: Secondary | ICD-10-CM | POA: Diagnosis not present

## 2022-05-08 DIAGNOSIS — Z79899 Other long term (current) drug therapy: Secondary | ICD-10-CM | POA: Diagnosis not present

## 2022-05-08 DIAGNOSIS — Z20822 Contact with and (suspected) exposure to covid-19: Secondary | ICD-10-CM | POA: Insufficient documentation

## 2022-05-08 DIAGNOSIS — R059 Cough, unspecified: Secondary | ICD-10-CM | POA: Diagnosis not present

## 2022-05-08 DIAGNOSIS — N189 Chronic kidney disease, unspecified: Secondary | ICD-10-CM | POA: Insufficient documentation

## 2022-05-08 DIAGNOSIS — J111 Influenza due to unidentified influenza virus with other respiratory manifestations: Secondary | ICD-10-CM | POA: Diagnosis not present

## 2022-05-08 LAB — RESP PANEL BY RT-PCR (RSV, FLU A&B, COVID)  RVPGX2
Influenza A by PCR: POSITIVE — AB
Influenza B by PCR: NEGATIVE
Resp Syncytial Virus by PCR: NEGATIVE
SARS Coronavirus 2 by RT PCR: NEGATIVE

## 2022-05-08 NOTE — Discharge Instructions (Addendum)
You have the flu this is a viral infection that will likely start to improve after 7-10 days, antibiotics are not helpful in treating viral infections.  You have declined Tamiflu. Please make sure you are drinking plenty of fluids. You can treat your symptoms supportively with tylenol 650 mg/1000mg  and ibuprofen 600 mg every 6 hours for fevers and pains. For nasal congestion you can use Zyrtec and Flonase to help with nasal congestion. To treat cough you can use over the counter cough medications such as Mucinex DM or Robitussin and throat lozenges. If your symptoms are not improving please follow up with you Primary doctor.   If you develop persistent fevers, shortness of breath or difficulty breathing, chest pain, severe headache and neck pain, persistent nausea and vomiting or other new or concerning symptoms return to the Emergency department.  Contact a doctor if: You get new symptoms. You have: Chest pain. Watery poop (diarrhea). A fever. Your cough gets worse. You start to have more mucus. You feel sick to your stomach. You throw up. Get help right away if you: Have shortness of breath. Have trouble breathing. Have skin or nails that turn a bluish color. Have very bad pain or stiffness in your neck. Get a sudden headache. Get sudden pain in your face or ear. Cannot eat or drink without throwing up. These symptoms may represent a serious problem that is an emergency. Get medical help right away. Call your local emergency services (911 in the [place] Do not wait to see if the symptoms will go away. Do not drive yourself to the hospital.

## 2022-05-08 NOTE — ED Triage Notes (Signed)
C/o cough, body chills and abdominal pain x 2 days.

## 2022-05-08 NOTE — ED Provider Notes (Signed)
[facility] EMERGENCY DEPARTMENT Provider Note   CSN: [id number] Arrival date & time: 05/08/22  1342     History Chief Complaint  Patient presents with   Cough   Abdominal Pain    Derek Reed is a 36 y.o. male with history of CKD, CHF from hypertension, hyperlipidemia presents the emergency room today for evaluation of flulike symptoms.  Patient reports that he has had a mild headache, runny nose, nasal congestion, diffuse bodyaches for the past 2 days.  Denies any fever, nausea, vomiting, diarrhea, constipation, chest pain, or shortness of breath.  He reports a cough as well.  He initially reported to nursing staff that he was having abdominal pain, but on further evaluation, he is feeling achy in his abdomen like he is the rest of his body.  He does not have any trouble eating or drinking or any nausea, vomiting, diarrhea, or constipation.  He does not have any specific abdominal pain. He denies any neck stiffness.  Reports that he had some DayQuil yesterday today but is not had any other medication recently.  Cough Associated symptoms: headaches, myalgias and rhinorrhea   Associated symptoms: no chest pain, no chills, no ear pain, no eye discharge, no fever, no rash, no shortness of breath and no sore throat   Abdominal Pain Associated symptoms: cough   Associated symptoms: no chest pain, no chills, no diarrhea, no dysuria, no fever, no hematuria, no nausea, no shortness of breath, no sore throat and no vomiting        Home Medications Prior to Admission medications   Medication Sig Start Date End Date Taking? Authorizing Provider  amLODipine (NORVASC) 10 MG tablet Take 1 tablet (10 mg total) by mouth daily. 11/18/21   Storm Frisk, MD  atorvastatin (LIPITOR) 10 MG tablet Take 1 tablet (10 mg total) by mouth daily. 11/19/21   Storm Frisk, MD  carvedilol (COREG) 12.5 MG tablet Take 1 tablet by mouth 2 times daily with a meal. 10/27/21 10/27/22  Storm Frisk,  MD  chlorthalidone (HYGROTON) 25 MG tablet Take 1 tablet (25 mg total) by mouth daily. 11/18/21   Storm Frisk, MD  isosorbide-hydrALAZINE (BIDIL) 20-37.5 MG tablet TAKE 2 TABLETS BY MOUTH 3 (THREE) TIMES DAILY. 10/27/21 10/27/22  Storm Frisk, MD  nitroGLYCERIN (NITROSTAT) 0.4 MG SL tablet Place 1 tablet (0.4 mg total) under the tongue every 5 (five) minutes as needed for chest pain. Patient not taking: Reported on 12/02/2021 10/27/21   Storm Frisk, MD  potassium chloride SA (KLOR-CON M) 20 MEQ tablet Take 1 tablet by mouth 2 times daily. 02/12/22   Storm Frisk, MD  sacubitril-valsartan (ENTRESTO) 97-103 MG TAKE 1 TABLET BY MOUTH 2 (TWO) TIMES DAILY. 03/20/22 03/20/23  Laurey Morale, MD  spironolactone (ALDACTONE) 25 MG tablet Take 1 tablet (25 mg total) by mouth daily. 10/27/21 10/27/22  Storm Frisk, MD      Allergies    Patient has no known allergies.    Review of Systems   Review of Systems  Constitutional:  Negative for chills and fever.  HENT:  Positive for congestion and rhinorrhea. Negative for drooling, ear pain, sore throat and trouble swallowing.   Eyes:  Negative for photophobia, discharge and visual disturbance.  Respiratory:  Positive for cough. Negative for shortness of breath.   Cardiovascular:  Negative for chest pain and palpitations.  Gastrointestinal:  Negative for abdominal pain, diarrhea, nausea and vomiting.  Genitourinary:  Negative for dysuria and  hematuria.  Musculoskeletal:  Positive for myalgias. Negative for arthralgias, back pain and joint swelling.  Skin:  Negative for color change and rash.  Neurological:  Positive for headaches. Negative for syncope and weakness.    Physical Exam Updated Vital Signs BP 116/74 (BP Location: Left Arm)   Pulse 80   Temp 98.3 F (36.8 C) (Oral)   Resp 18   Ht 5\' 6"  (1.676 m)   Wt 108.9 kg   SpO2 99%   BMI 38.74 kg/m  Physical Exam Vitals and nursing note reviewed.  Constitutional:       General: He is not in acute distress.    Appearance: He is not toxic-appearing.  HENT:     Head: Normocephalic and atraumatic.     Right Ear: Tympanic membrane, ear canal and external ear normal.     Left Ear: Tympanic membrane, ear canal and external ear normal.     Nose:     Comments: Bilateral nasal turbinate edema and erythema with scant clear nasal discharge.    Mouth/Throat:     Mouth: Mucous membranes are moist.     Comments: No pharyngeal erythema, exudate, or edema noted.  Uvula midline.  Airway patent.  Moist mucous membranes. Eyes:     General: No scleral icterus.    Conjunctiva/sclera: Conjunctivae normal.  Cardiovascular:     Rate and Rhythm: Normal rate.  Pulmonary:     Effort: Pulmonary effort is normal. No respiratory distress.  Abdominal:     General: Bowel sounds are normal. There is no distension.     Palpations: Abdomen is soft.     Tenderness: There is no abdominal tenderness. There is no guarding or rebound.  Musculoskeletal:        General: No deformity.     Cervical back: Normal range of motion.  Skin:    Findings: No rash.  Neurological:     General: No focal deficit present.     Mental Status: He is alert.     ED Results / Procedures / Treatments   Labs (all labs ordered are listed, but only abnormal results are displayed) Labs Reviewed  RESP PANEL BY RT-PCR (RSV, FLU A&B, COVID)  [id number] - Abnormal; Notable for the following components:      Result Value   Influenza A by PCR POSITIVE (*)    All other components within normal limits    EKG None  Radiology No results found.  Procedures Procedures   Medications Ordered in ED Medications - No data to display  ED Course/ Medical Decision Making/ A&P                           Medical Decision Making  36 year old male presents the emergency room today for evaluation of flulike symptoms.  Differential diagnosis includes but limited to COVID, flu, RSV, viral illness, bronchitis.  Vital  signs unremarkable.  Patient normotensive, afebrile, normal pulse rate, satting well room air without increased work breathing.  Physical exam as noted above.  Lung sounds are clear.  No respiratory distress.  Flu +.  Patient with symptoms consistent with influenza.  Vitals are stable, low-grade fever.  No signs of dehydration, tolerating PO's.  Lungs are clear. Due to patient's presentation and physical exam a chest x-ray was not ordered bc likely diagnosis of flu.  Discussed the cost versus benefit of Tamiflu treatment with the patient. He declines.  Patient will be discharged with instructions to orally hydrate, rest,  and use over-the-counter medications such as anti-inflammatories ibuprofen and Aleve for muscle aches and Tylenol for fever.  Return precautions were flag symptoms.  Patient verbalizes understanding agrees the plan.  Patient is stable to be discharged home in good condition.  Final Clinical Impression(s) / ED Diagnoses Final diagnoses:  Flu    Rx / DC Orders ED Discharge Orders     None         Achille Rich, PA-C 05/08/22 1544    Virgina Norfolk, DO 05/08/22 1944

## 2022-05-08 NOTE — ED Notes (Signed)
ED Provider at bedside. 

## 2022-05-10 ENCOUNTER — Encounter: Payer: Self-pay | Admitting: Critical Care Medicine

## 2022-05-11 ENCOUNTER — Other Ambulatory Visit: Payer: Self-pay | Admitting: Critical Care Medicine

## 2022-05-11 ENCOUNTER — Other Ambulatory Visit (HOSPITAL_COMMUNITY): Payer: Self-pay

## 2022-05-11 ENCOUNTER — Encounter: Payer: Self-pay | Admitting: Critical Care Medicine

## 2022-05-11 MED ORDER — SPIRONOLACTONE 25 MG PO TABS: 25.0000 mg | ORAL_TABLET | Freq: Every day | ORAL | 1 refills | Status: DC

## 2022-05-11 NOTE — Progress Notes (Signed)
Orders for refill spirinolactone

## 2022-05-12 ENCOUNTER — Encounter: Payer: Self-pay | Admitting: Critical Care Medicine

## 2022-06-13 ENCOUNTER — Other Ambulatory Visit (HOSPITAL_COMMUNITY): Payer: Self-pay | Admitting: Cardiology

## 2022-06-13 ENCOUNTER — Other Ambulatory Visit: Payer: Self-pay | Admitting: Critical Care Medicine

## 2022-06-15 ENCOUNTER — Other Ambulatory Visit: Payer: Self-pay | Admitting: Critical Care Medicine

## 2022-06-15 ENCOUNTER — Other Ambulatory Visit: Payer: Self-pay

## 2022-06-15 ENCOUNTER — Other Ambulatory Visit (HOSPITAL_COMMUNITY): Payer: Self-pay

## 2022-06-15 MED ORDER — ENTRESTO 97-103 MG PO TABS
1.0000 | ORAL_TABLET | Freq: Two times a day (BID) | ORAL | 0 refills | Status: DC
Start: 2022-06-15 — End: 2022-06-24
  Filled 2022-06-15 (×3): qty 60, 30d supply, fill #0

## 2022-06-15 NOTE — Telephone Encounter (Signed)
Requested medication (s) are due for refill today: yes  Requested medication (s) are on the active medication list: yes   Last refill:  02/12/22 #60 0 refills  Future visit scheduled: yes in 1 week  Notes to clinic:  no refills remain, do you want to refill Rx?     Requested Prescriptions  Pending Prescriptions Disp Refills   potassium chloride SA (KLOR-CON M) 20 MEQ tablet 60 tablet 0    Sig: Take 1 tablet by mouth 2 times daily.     Endocrinology:  Minerals - Potassium Supplementation Failed - 06/13/2022 10:00 AM      Failed - Cr in normal range and within 360 days    Creat  Date Value Ref Range Status  03/26/2014 1.21 0.50 - 1.35 mg/dL Final   Creatinine, Ser  Date Value Ref Range Status  11/23/2021 1.40 (H) 0.61 - 1.24 mg/dL Final         Passed - K in normal range and within 360 days    Potassium  Date Value Ref Range Status  11/23/2021 4.5 3.5 - 5.1 mmol/L Final         Passed - Valid encounter within last 12 months    Recent Outpatient Visits           6 months ago Chronic systolic heart failure (HCC)due to Hypertension   [place] Elsie Stain, MD   1 year ago Primary hypertension   [place], Patrick E, MD   1 year ago Chronic systolic heart failure (HCC)due to Hypertension   [place] Elsie Stain, MD   1 year ago Chronic systolic heart failure (HCC)due to Hypertension   [place] Elsie Stain, MD   2 years ago Chronic systolic heart failure (HCC)due to Hypertension   [place] Elsie Stain, MD       Future Appointments             In 1 week Thereasa Solo [facility] [place]

## 2022-06-16 ENCOUNTER — Other Ambulatory Visit (HOSPITAL_COMMUNITY): Payer: Self-pay

## 2022-06-16 NOTE — Telephone Encounter (Signed)
Requested medication (s) are due for refill today: yes  Requested medication (s) are on the active medication list: yes  Last refill:  02/12/22  Future visit scheduled:yes  Notes to clinic:  Unable to refill per protocol, courtesy refill already given, routing for provider approval.      Requested Prescriptions  Pending Prescriptions Disp Refills   potassium chloride SA (KLOR-CON M) 20 MEQ tablet 60 tablet 0    Sig: Take 1 tablet by mouth 2 times daily.     Endocrinology:  Minerals - Potassium Supplementation Failed - 06/15/2022  2:48 PM      Failed - Cr in normal range and within 360 days    Creat  Date Value Ref Range Status  03/26/2014 1.21 0.50 - 1.35 mg/dL Final   Creatinine, Ser  Date Value Ref Range Status  11/23/2021 1.40 (H) 0.61 - 1.24 mg/dL Final         Passed - K in normal range and within 360 days    Potassium  Date Value Ref Range Status  11/23/2021 4.5 3.5 - 5.1 mmol/L Final         Passed - Valid encounter within last 12 months    Recent Outpatient Visits           7 months ago Chronic systolic heart failure (HCC)due to Hypertension   [place] Elsie Stain, MD   1 year ago Primary hypertension   [place], Patrick E, MD   1 year ago Chronic systolic heart failure (HCC)due to Hypertension   [place] Elsie Stain, MD   1 year ago Chronic systolic heart failure (HCC)due to Hypertension   [place] Elsie Stain, MD   2 years ago Chronic systolic heart failure (HCC)due to Hypertension   [place] Elsie Stain, MD       Future Appointments             In 1 week Thereasa Solo Casimer [facility] [place]

## 2022-06-17 ENCOUNTER — Other Ambulatory Visit: Payer: Self-pay | Admitting: Critical Care Medicine

## 2022-06-18 ENCOUNTER — Other Ambulatory Visit: Payer: Self-pay | Admitting: Critical Care Medicine

## 2022-06-18 ENCOUNTER — Encounter: Payer: Self-pay | Admitting: Critical Care Medicine

## 2022-06-18 ENCOUNTER — Emergency Department (HOSPITAL_BASED_OUTPATIENT_CLINIC_OR_DEPARTMENT_OTHER)
Admission: EM | Admit: 2022-06-18 | Discharge: 2022-06-18 | Disposition: A | Discharge status: Home / Self Care | Payer: Self-pay | Attending: Emergency Medicine | Admitting: Emergency Medicine

## 2022-06-18 ENCOUNTER — Encounter (HOSPITAL_BASED_OUTPATIENT_CLINIC_OR_DEPARTMENT_OTHER): Payer: Self-pay

## 2022-06-18 DIAGNOSIS — R197 Diarrhea, unspecified: Secondary | ICD-10-CM | POA: Insufficient documentation

## 2022-06-18 DIAGNOSIS — Z79899 Other long term (current) drug therapy: Secondary | ICD-10-CM | POA: Insufficient documentation

## 2022-06-18 DIAGNOSIS — R944 Abnormal results of kidney function studies: Secondary | ICD-10-CM | POA: Insufficient documentation

## 2022-06-18 DIAGNOSIS — I130 Hypertensive heart and chronic kidney disease with heart failure and stage 1 through stage 4 chronic kidney disease, or unspecified chronic kidney disease: Secondary | ICD-10-CM | POA: Insufficient documentation

## 2022-06-18 DIAGNOSIS — N189 Chronic kidney disease, unspecified: Secondary | ICD-10-CM | POA: Insufficient documentation

## 2022-06-18 DIAGNOSIS — R1084 Generalized abdominal pain: Secondary | ICD-10-CM | POA: Insufficient documentation

## 2022-06-18 DIAGNOSIS — I509 Heart failure, unspecified: Secondary | ICD-10-CM | POA: Insufficient documentation

## 2022-06-18 DIAGNOSIS — E871 Hypo-osmolality and hyponatremia: Secondary | ICD-10-CM | POA: Insufficient documentation

## 2022-06-18 LAB — COMPREHENSIVE METABOLIC PANEL
ALT: 17 U/L (ref 0–44)
AST: 21 U/L (ref 15–41)
Albumin: 3.8 g/dL (ref 3.5–5.0)
Alkaline Phosphatase: 70 U/L (ref 38–126)
Anion gap: 6 (ref 5–15)
BUN: 20 mg/dL (ref 6–20)
CO2: 22 mmol/L (ref 22–32)
Calcium: 8.6 mg/dL — ABNORMAL LOW (ref 8.9–10.3)
Chloride: 103 mmol/L (ref 98–111)
Creatinine, Ser: 2.06 mg/dL — ABNORMAL HIGH (ref 0.61–1.24)
GFR, Estimated: 42 mL/min — ABNORMAL LOW (ref >60)
Glucose, Bld: 105 mg/dL — ABNORMAL HIGH (ref 70–99)
Potassium: 3.9 mmol/L (ref 3.5–5.1)
Sodium: 131 mmol/L — ABNORMAL LOW (ref 135–145)
Total Bilirubin: 0.6 mg/dL (ref 0.3–1.2)
Total Protein: 8.1 g/dL (ref 6.5–8.1)

## 2022-06-18 LAB — CBC
HCT: 38.8 % — ABNORMAL LOW (ref 39.0–52.0)
Hemoglobin: 12.4 g/dL — ABNORMAL LOW (ref 13.0–17.0)
MCH: 23.4 pg — ABNORMAL LOW (ref 26.0–34.0)
MCHC: 32 g/dL (ref 30.0–36.0)
MCV: 73.2 fL — ABNORMAL LOW (ref 80.0–100.0)
Platelets: 261 10*3/uL (ref 150–400)
RBC: 5.3 MIL/uL (ref 4.22–5.81)
RDW: 15.4 % (ref 11.5–15.5)
WBC: 6.1 10*3/uL (ref 4.0–10.5)
nRBC: 0 % (ref 0.0–0.2)

## 2022-06-18 LAB — URINALYSIS, ROUTINE W REFLEX MICROSCOPIC
Bilirubin Urine: NEGATIVE
Glucose, UA: NEGATIVE mg/dL
Hgb urine dipstick: NEGATIVE
Ketones, ur: NEGATIVE mg/dL
Leukocytes,Ua: NEGATIVE
Nitrite: NEGATIVE
Protein, ur: NEGATIVE mg/dL
Specific Gravity, Urine: >=1.03 (ref 1.005–1.030)
pH: 5 (ref 5.0–8.0)

## 2022-06-18 LAB — LIPASE, BLOOD: Lipase: 29 U/L (ref 11–51)

## 2022-06-18 MED ORDER — SODIUM CHLORIDE 0.9 % IV BOLUS
1000.0000 mL | Freq: Once | INTRAVENOUS | Status: AC
  Administered 2022-06-18: 1000 mL via INTRAVENOUS

## 2022-06-18 MED ORDER — POTASSIUM CHLORIDE CRYS ER 20 MEQ PO TBCR: 20.0000 meq | EXTENDED_RELEASE_TABLET | Freq: Two times a day (BID) | ORAL | 0 refills | Status: DC

## 2022-06-18 NOTE — Discharge Instructions (Signed)
Thank you for allowing me to be part of your care today.  Your workup was overall reassuring.  You do show evidence of mild anemia, which I suggest starting an iron supplement for and following up with your primary care provider to recheck.    It is also important you stay well-hydrated while you have diarrhea.  It is likely your diarrhea is caused by a virus which will have to run its course.  You may use over-the-counter antidiarrheal medication such as Imodium to slow or stop your diarrhea.    Return to the ER if you develop worsening of your symptoms or if you have any new concerns.

## 2022-06-18 NOTE — Telephone Encounter (Signed)
Potassium refill

## 2022-06-18 NOTE — ED Triage Notes (Signed)
C/o abdominal pain, diarrhea since yesterday. Denies N/V.

## 2022-06-18 NOTE — ED Provider Notes (Signed)
[facility] [facility] Provider Note   CSN: [id number] Arrival date & time: 06/18/22  1259     History  Chief Complaint  Patient presents with   Abdominal Pain    Derek Reed is a 37 y.o. male presents to the ED with generalized abdominal pain and diarrhea for the past 2 days.  He states that he has had multiple episodes throughout the day of loose brown stool.  He denies any known sick contacts, has not recently traveled outside of the country, and reports no new or unusual foods.  Denies nausea, vomiting, melena, hematochezia, fever, appetite change.        Home Medications Prior to Admission medications   Medication Sig Start Date End Date Taking? Authorizing Provider  amLODipine (NORVASC) 10 MG tablet Take 1 tablet (10 mg total) by mouth daily. 11/18/21   Elsie Stain, MD  atorvastatin (LIPITOR) 10 MG tablet Take 1 tablet (10 mg total) by mouth daily. 11/19/21   Elsie Stain, MD  carvedilol (COREG) 12.5 MG tablet Take 1 tablet by mouth 2 times daily with a meal. 10/27/21 10/27/22  Elsie Stain, MD  chlorthalidone (HYGROTON) 25 MG tablet Take 1 tablet (25 mg total) by mouth daily. 11/18/21   Elsie Stain, MD  isosorbide-hydrALAZINE (BIDIL) 20-37.5 MG tablet TAKE 2 TABLETS BY MOUTH 3 (THREE) TIMES DAILY. 10/27/21 10/27/22  Elsie Stain, MD  nitroGLYCERIN (NITROSTAT) 0.4 MG SL tablet Place 1 tablet (0.4 mg total) under the tongue every 5 (five) minutes as needed for chest pain. Patient not taking: Reported on 12/02/2021 10/27/21   Elsie Stain, MD  potassium chloride SA (KLOR-CON M) 20 MEQ tablet Take 1 tablet by mouth 2 times daily. 06/18/22   Elsie Stain, MD  sacubitril-valsartan (ENTRESTO) 97-103 MG TAKE 1 TABLET BY MOUTH 2 (TWO) TIMES DAILY. 06/15/22 06/15/23  Larey Dresser, MD  spironolactone (ALDACTONE) 25 MG tablet Take 1 tablet (25 mg total) by mouth daily. 05/11/22 05/11/23  Elsie Stain, MD      Allergies     Patient has no known allergies.    Review of Systems   Review of Systems  Constitutional:  Negative for appetite change and fever.  Gastrointestinal:  Positive for abdominal pain and diarrhea. Negative for abdominal distention, blood in stool, nausea and vomiting.    Physical Exam Updated Vital Signs BP 101/69   Pulse 78   Temp 98.3 F (36.8 C) (Oral)   Resp 16   Ht 5' 6"$  (1.676 m)   Wt 106.6 kg   SpO2 100%   BMI 37.93 kg/m  Physical Exam Vitals and nursing note reviewed.  Constitutional:      General: He is not in acute distress.    Appearance: He is not ill-appearing.  HENT:     Mouth/Throat:     Mouth: Mucous membranes are moist.     Pharynx: Oropharynx is clear.  Cardiovascular:     Rate and Rhythm: Normal rate and regular rhythm.     Pulses: Normal pulses.     Heart sounds: Normal heart sounds.  Pulmonary:     Effort: Pulmonary effort is normal. No respiratory distress.     Breath sounds: Normal breath sounds and air entry.  Abdominal:     General: Abdomen is flat. Bowel sounds are normal. There is no distension.     Palpations: Abdomen is soft.     Tenderness: There is generalized abdominal tenderness (mild).  Skin:  General: Skin is warm and dry.     Capillary Refill: Capillary refill takes less than 2 seconds.  Neurological:     Mental Status: He is alert. Mental status is at baseline.  Psychiatric:        Mood and Affect: Mood normal.        Behavior: Behavior normal.     ED Results / Procedures / Treatments   Labs (all labs ordered are listed, but only abnormal results are displayed) Labs Reviewed  COMPREHENSIVE METABOLIC PANEL - Abnormal; Notable for the following components:      Result Value   Sodium 131 (*)    Glucose, Bld 105 (*)    Creatinine, Ser 2.06 (*)    Calcium 8.6 (*)    GFR, Estimated 42 (*)    All other components within normal limits  CBC - Abnormal; Notable for the following components:   Hemoglobin 12.4 (*)    HCT 38.8  (*)    MCV 73.2 (*)    MCH 23.4 (*)    All other components within normal limits  LIPASE, BLOOD  URINALYSIS, ROUTINE W REFLEX MICROSCOPIC    EKG None  Radiology No results found.  Procedures Procedures    Medications Ordered in ED Medications  sodium chloride 0.9 % bolus 1,000 mL (0 mLs Intravenous Stopped 06/18/22 1853)  sodium chloride 0.9 % bolus 1,000 mL (0 mLs Intravenous Stopped 06/18/22 1615)    ED Course/ Medical Decision Making/ A&P                             Medical Decision Making Amount and/or Complexity of Data Reviewed Labs: ordered.   This patient presents to the ED with chief complaint(s) of abdominal pain and diarrhea with pertinent past medical history of CKD, HTN, CHF .The complaint involves an extensive differential diagnosis and also carries with it a high risk of complications and morbidity.    The differential diagnosis includes gastroenteritis, diverticulitis, infectious diarrhea, electrolyte abnormalities, IBS, Enterotoxigenic E. Coli, norovirus, campylobacter, Non-typhoidal Salmonella   The initial plan is to obtain baseline labs to assess for electrolyte abnormalities   Additional history obtained: Additional history obtained from  none Records reviewed Primary Care Documents  Initial Assessment:   On exam, patient is overall well-appearing and does not appear to be in acute distress.  Heart rate is normal in the 80s with regular rhythm.  Lungs are clear to auscultation bilaterally.  Abdomen is soft, nondistended, with mild generalized tenderness.  Bowel sounds are normal.  Skin is warm and dry.  Patient is afebrile.  Independent ECG/labs interpretation:  The following labs were independently interpreted:  CBC with mild anemia, hemoglobin 12.4, appears to be microcytic anemia which patient does have history of Metabolic panel significant for mild hyponatremia, hypocalcemia, and elevated creatinine above patient's baseline.  Low suspicion for  AKI given he does not meet criteria. UA with high specific gravity, but otherwise unremarkable.  Likely dehydration.  Independent visualization and interpretation of imaging: I independently visualized the following imaging with scope of interpretation limited to determining acute life threatening conditions related to emergency care: Patient has mild generalized abdominal tenderness and diarrhea that has only been persistent for 2 days, he does not have any point tenderness, and has no fever, nausea, or vomiting - I do not feel that imaging is warranted at this time.  Treatment and Reassessment: Will treat patient's dehydration with fluid boluses and reassess.  Patient  was able to tolerate PO intake without difficulty.  He reports that he feels better.  He has only had 1 episode of diarrhea upon his arrival at the ED.  Suspect that his symptoms are viral in nature and will have to run their course.  Disposition:   The patient has been appropriately medically screened and/or stabilized in the ED. I have low suspicion for any other emergent medical condition which would require further screening, evaluation or treatment in the ED or require inpatient management. At time of discharge the patient is hemodynamically stable and in no acute distress. I have discussed work-up results and diagnosis with patient and answered all questions. Patient is agreeable with discharge plan. We discussed strict return precautions for returning to the emergency department and they verbalized understanding.  Stressed the importance of good oral hydration while at home especially while diarrhea symptoms are still present.  Recommended use of antidiarrheal medication such as Imodium if he continues to have diarrhea.           Final Clinical Impression(s) / ED Diagnoses Final diagnoses:  Diarrhea of presumed infectious origin  Generalized abdominal pain    Rx / DC Orders ED Discharge Orders     None          Pat Kocher, PA 06/18/22 Hendricks, [place], DO 06/19/22 [phone]

## 2022-06-19 ENCOUNTER — Encounter: Payer: Self-pay | Admitting: Critical Care Medicine

## 2022-06-19 ENCOUNTER — Other Ambulatory Visit: Payer: Self-pay | Admitting: Critical Care Medicine

## 2022-06-19 ENCOUNTER — Other Ambulatory Visit (HOSPITAL_COMMUNITY): Payer: Self-pay

## 2022-06-19 MED ORDER — POTASSIUM CHLORIDE CRYS ER 20 MEQ PO TBCR: 20.0000 meq | EXTENDED_RELEASE_TABLET | Freq: Two times a day (BID) | ORAL | 0 refills | Status: DC

## 2022-06-19 NOTE — Progress Notes (Signed)
Potasium refill

## 2022-06-24 ENCOUNTER — Other Ambulatory Visit: Payer: Self-pay

## 2022-06-24 ENCOUNTER — Encounter: Payer: Self-pay | Admitting: Physician Assistant

## 2022-06-24 ENCOUNTER — Other Ambulatory Visit (HOSPITAL_COMMUNITY): Payer: Self-pay

## 2022-06-24 ENCOUNTER — Ambulatory Visit: Payer: Self-pay | Attending: Physician Assistant | Admitting: Physician Assistant

## 2022-06-24 VITALS — BP 98/68 | HR 72 | Temp 98.6°F | Wt 246.4 lb

## 2022-06-24 DIAGNOSIS — N182 Chronic kidney disease, stage 2 (mild): Secondary | ICD-10-CM

## 2022-06-24 DIAGNOSIS — R739 Hyperglycemia, unspecified: Secondary | ICD-10-CM

## 2022-06-24 DIAGNOSIS — I10 Essential (primary) hypertension: Secondary | ICD-10-CM

## 2022-06-24 DIAGNOSIS — E871 Hypo-osmolality and hyponatremia: Secondary | ICD-10-CM

## 2022-06-24 DIAGNOSIS — Z09 Encounter for follow-up examination after completed treatment for conditions other than malignant neoplasm: Secondary | ICD-10-CM

## 2022-06-24 DIAGNOSIS — I5022 Chronic systolic (congestive) heart failure: Secondary | ICD-10-CM

## 2022-06-24 DIAGNOSIS — E782 Mixed hyperlipidemia: Secondary | ICD-10-CM

## 2022-06-24 MED ORDER — ENTRESTO 97-103 MG PO TABS
1.0000 | ORAL_TABLET | Freq: Two times a day (BID) | ORAL | 0 refills | Status: DC
  Filled 2022-06-24 – 2022-08-17 (×2): qty 60, 30d supply, fill #0

## 2022-06-24 MED ORDER — CARVEDILOL 12.5 MG PO TABS
12.5000 mg | ORAL_TABLET | Freq: Two times a day (BID) | ORAL | 2 refills | Status: DC
  Filled 2022-06-24: qty 180, 90d supply, fill #0

## 2022-06-24 MED ORDER — CHLORTHALIDONE 25 MG PO TABS
25.0000 mg | ORAL_TABLET | Freq: Every day | ORAL | 2 refills | Status: DC
  Filled 2022-06-24: qty 90, 90d supply, fill #0
  Filled 2022-10-27: qty 30, 30d supply, fill #0
  Filled 2022-11-30: qty 30, 30d supply, fill #1

## 2022-06-24 MED ORDER — ISOSORB DINITRATE-HYDRALAZINE 20-37.5 MG PO TABS
2.0000 | ORAL_TABLET | Freq: Two times a day (BID) | ORAL | 3 refills | Status: DC
  Filled 2022-06-24: qty 120, 30d supply, fill #0

## 2022-06-24 MED ORDER — AMLODIPINE BESYLATE 10 MG PO TABS
5.0000 mg | ORAL_TABLET | Freq: Every day | ORAL | 2 refills | Status: DC
  Filled 2022-06-24: qty 45, 90d supply, fill #0

## 2022-06-24 MED ORDER — BLOOD PRESSURE MONITOR MISC
1.0000 | Freq: Every day | 0 refills | Status: DC
  Filled 2022-06-24: qty 1, fill #0
  Filled 2022-08-17: qty 1, 30d supply, fill #0

## 2022-06-24 MED ORDER — ATORVASTATIN CALCIUM 10 MG PO TABS
10.0000 mg | ORAL_TABLET | Freq: Every day | ORAL | 3 refills | Status: DC
  Filled 2022-06-24: qty 90, 90d supply, fill #0
  Filled 2022-10-23: qty 30, 30d supply, fill #0
  Filled 2022-12-21: qty 30, 30d supply, fill #1
  Filled 2023-01-11 – 2023-01-19 (×2): qty 30, 30d supply, fill #2

## 2022-06-24 MED ORDER — SPIRONOLACTONE 25 MG PO TABS
25.0000 mg | ORAL_TABLET | Freq: Every day | ORAL | 1 refills | Status: DC
  Filled 2022-06-24: qty 90, 90d supply, fill #0
  Filled 2022-08-17: qty 30, 30d supply, fill #0
  Filled 2022-09-21: qty 30, 30d supply, fill #1
  Filled 2022-10-23: qty 30, 30d supply, fill #2
  Filled 2022-11-29: qty 30, 30d supply, fill #3

## 2022-06-24 MED ORDER — POTASSIUM CHLORIDE CRYS ER 20 MEQ PO TBCR
20.0000 meq | EXTENDED_RELEASE_TABLET | Freq: Two times a day (BID) | ORAL | 3 refills | Status: DC
  Filled 2022-06-24 – 2022-07-20 (×2): qty 60, 30d supply, fill #0
  Filled 2022-08-17: qty 60, 30d supply, fill #1
  Filled 2022-09-21: qty 60, 30d supply, fill #2
  Filled 2022-11-04: qty 60, 30d supply, fill #3

## 2022-06-24 NOTE — Patient Instructions (Addendum)
Goal blood pressure <130/<85. Normal blood pressure =120/80  Check blood pressures daily and record and bring to next visit.  I have lowered the amlodipine 69m to 547m

## 2022-06-24 NOTE — Progress Notes (Signed)
Patient ID: Derek Reed, male   DOB: 23-Oct-1985, 37 y.o.   MRN: [id number]     Derek Reed, is a 37 y.o. male  [id number]  [id number]  DOB - 1985/09/26  Chief Complaint  Patient presents with   Abdominal Pain       Subjective:   Derek Reed is a 37 y.o. male here today for a follow up visit After being [facility] 06[facility]18/20[facility] for abdominal pain and diarrhea.  The abdominal pain and diarrhea have resolved.  He complains of constant thirst.  He is feeling much better overall.  He last saw cardiology 11/2021 with no f/up scheduled.  He denies SOB/CP/dizziness.    From ED note:  Derek Reed is a 37 y.o. male presents to the ED with generalized abdominal pain and diarrhea for the past 2 days.  He states that he has had multiple episodes throughout the day of loose brown stool.  He denies any known sick contacts, has not recently traveled outside of the country, and reports no new or unusual foods.  Denies nausea, vomiting, melena, hematochezia, fever, appetite change.    This patient presents to the ED with chief complaint(s) of abdominal pain and diarrhea with pertinent past medical history of CKD, HTN, CHF .The complaint involves an extensive differential diagnosis and also carries with it a high risk of complications and morbidity.     The differential diagnosis includes gastroenteritis, diverticulitis, infectious diarrhea, electrolyte abnormalities, IBS, Enterotoxigenic E. Coli, norovirus, campylobacter, Non-typhoidal Salmonella    The initial plan is to obtain baseline labs to assess for electrolyte abnormalities    Additional history obtained: Additional history obtained from  none Records reviewed Primary Care Documents   Initial Assessment:   On exam, patient is overall well-appearing and does not appear to be in acute distress.  Heart rate is normal in the 80s with regular rhythm.  Lungs are clear to auscultation bilaterally.  Abdomen is soft,  nondistended, with mild generalized tenderness.  Bowel sounds are normal.  Skin is warm and dry.  Patient is afebrile.   Independent ECG/labs interpretation:  The following labs were independently interpreted:  CBC with mild anemia, hemoglobin 12.4, appears to be microcytic anemia which patient does have history of Metabolic panel significant for mild hyponatremia, hypocalcemia, and elevated creatinine above patient's baseline.  Low suspicion for AKI given he does not meet criteria. UA with high specific gravity, but otherwise unremarkable.  Likely dehydration.   Independent visualization and interpretation of imaging: I independently visualized the following imaging with scope of interpretation limited to determining acute life threatening conditions related to emergency care: Patient has mild generalized abdominal tenderness and diarrhea that has only been persistent for 2 days, he does not have any point tenderness, and has no fever, nausea, or vomiting - I do not feel that imaging is warranted at this time.   Treatment and Reassessment: Will treat patient's dehydration with fluid boluses and reassess.  Patient was able to tolerate PO intake without difficulty.  He reports that he feels better.  He has only had 1 episode of diarrhea upon his arrival at the ED.  Suspect that his symptoms are viral in nature and will have to run their course.    No problems updated.  ALLERGIES: No Known Allergies  PAST MEDICAL HISTORY: Past Medical History:  Diagnosis Date   Acute CHF (congestive heart failure) ([place]) 02/24/2019   Anxiety    Hypertension    Obesity    Peritonsillar abscess  MEDICATIONS AT HOME: Prior to Admission medications   Medication Sig Start Date End Date Taking? Authorizing Provider  Blood Pressure Monitoring (BLOOD PRESSURE CUFF) MISC 1 each by Does not apply route daily. 06/24/22  Yes Freeman Caldron M, PA-C  amLODipine (NORVASC) 10 MG tablet Take 0.5 tablets (5 mg  total) by mouth daily. 06/24/22   Argentina Donovan, PA-C  atorvastatin (LIPITOR) 10 MG tablet Take 1 tablet (10 mg total) by mouth daily. 06/24/22   Argentina Donovan, PA-C  carvedilol (COREG) 12.5 MG tablet Take 1 tablet by mouth 2 times daily with a meal. 06/24/22 06/24/23  Argentina Donovan, PA-C  chlorthalidone (HYGROTON) 25 MG tablet Take 1 tablet (25 mg total) by mouth daily. 06/24/22   Argentina Donovan, PA-C  isosorbide-hydrALAZINE (BIDIL) 20-37.5 MG tablet Take 2 tablets by mouth 2 (two) times daily. 06/24/22 06/24/23  Argentina Donovan, PA-C  nitroGLYCERIN (NITROSTAT) 0.4 MG SL tablet Place 1 tablet (0.4 mg total) under the tongue every 5 (five) minutes as needed for chest pain. Patient not taking: Reported on 12/02/2021 10/27/21   Elsie Stain, MD  potassium chloride SA (KLOR-CON M) 20 MEQ tablet Take 1 tablet by mouth 2 times daily. 06/24/22   Argentina Donovan, PA-C  sacubitril-valsartan (ENTRESTO) 97-103 MG TAKE 1 TABLET BY MOUTH 2 (TWO) TIMES DAILY. 06/24/22 06/24/23  Argentina Donovan, PA-C  spironolactone (ALDACTONE) 25 MG tablet Take 1 tablet (25 mg total) by mouth daily. 06/24/22 06/24/23  Argentina Donovan, PA-C    ROS: Neg HEENT Neg resp Neg cardiac Neg GU Neg MS Neg psych Neg neuro  Objective:   Vitals:   06/24/22 1049  BP: (!) 95/59  Pulse: 72  Temp: 98.6 F (37 C)  SpO2: 99%  Weight: 246 lb 6.4 oz (111.8 kg)    BP recheck 98/68 Exam General appearance : Awake, alert, not in any distress. Speech Clear. Not toxic looking HEENT: Atraumatic and Normocephalic Neck: Supple, no JVD. No cervical lymphadenopathy.  Chest: Good air entry bilaterally, CTAB.  No rales/rhonchi/wheezing CVS: S1 S2 regular, no murmurs.  Extremities: B/L Lower Ext shows no edema, both legs are warm to touch Neurology: Awake alert, and oriented X 3, CN II-XII intact, Non focal Skin: No Rash  Data Review Lab Results  Component Value Date   HGBA1C 6.2 (H) 02/24/2019    Assessment & Plan    1. High blood sugar I have had a lengthy discussion and provided education about insulin resistance and the intake of too much sugar/refined carbohydrates.  I have advised the patient to work at a goal of eliminating sugary drinks, candy, desserts, sweets, refined sugars, processed foods, and white carbohydrates.  The patient expresses understanding.  - Hemoglobin A1c - Comprehensive metabolic panel  2. Mixed hyperlipidemia Continue atorvastatin  3. Chronic systolic heart failure (HCC)due to Hypertension - sacubitril-valsartan (ENTRESTO) 97-103 MG; TAKE 1 TABLET BY MOUTH 2 (TWO) TIMES DAILY.  Dispense: 60 tablet; Refill: 0 - spironolactone (ALDACTONE) 25 MG tablet; Take 1 tablet (25 mg total) by mouth daily.  Dispense: 90 tablet; Refill: 1 - carvedilol (COREG) 12.5 MG tablet; Take 1 tablet by mouth 2 times daily with a meal.  Dispense: 180 tablet; Refill: 2 - isosorbide-hydrALAZINE (BIDIL) 20-37.5 MG tablet; Take 2 tablets by mouth 2 (two) times daily.  Dispense: 120 tablet; Refill: 3 - chlorthalidone (HYGROTON) 25 MG tablet; Take 1 tablet (25 mg total) by mouth daily.  Dispense: 90 tablet; Refill: 2 - Comprehensive metabolic panel  4.  CKD (chronic kidney disease), stage II  5. Hyponatremia Hopefully resolved s/p stomach virus - Comprehensive metabolic panel  6. Encounter for examination following treatment at hospital  7. Hypertension, unspecified type Maybe overmedicated?  He is asymptomatic.  I will decrease his amlodipine from 38m to 5105mand continue other meds - sacubitril-valsartan (ENTRESTO) 97-103 MG; TAKE 1 TABLET BY MOUTH 2 (TWO) TIMES DAILY.  Dispense: 60 tablet; Refill: 0 - potassium chloride SA (KLOR-CON M) 20 MEQ tablet; Take 1 tablet by mouth 2 times daily.  Dispense: 60 tablet; Refill: 3 - Blood Pressure Monitoring (BLOOD PRESSURE CUFF) MISC; 1 each by Does not apply route daily.  Dispense: 1 each; Refill: 0 - amLODipine (NORVASC) 10 MG tablet; Take 0.5 tablets (5 mg  total) by mouth daily.  Dispense: 45 tablet; Refill: 2 - Comprehensive metabolic panel    Return for 6 weeks Luke for BP and 4 months Dr WrJoya Gaskins The patient was given clear instructions to go to ER or return to medical center if symptoms don't improve, worsen or new problems develop. The patient verbalized understanding. The patient was told to call to get lab results if they haven't heard anything in the next week.      AnFreeman CaldronPA-C CoMidm[facility] WeC[facility]portN[facility] 06/24/2022, 11:48 AM

## 2022-06-25 ENCOUNTER — Encounter: Payer: Self-pay | Admitting: Critical Care Medicine

## 2022-06-25 ENCOUNTER — Other Ambulatory Visit: Payer: Self-pay | Admitting: Physician Assistant

## 2022-06-25 ENCOUNTER — Other Ambulatory Visit (HOSPITAL_COMMUNITY): Payer: Self-pay

## 2022-06-25 DIAGNOSIS — E1165 Type 2 diabetes mellitus with hyperglycemia: Secondary | ICD-10-CM

## 2022-06-25 LAB — COMPREHENSIVE METABOLIC PANEL
ALT: 14 IU/L (ref 0–44)
AST: 17 IU/L (ref 0–40)
Albumin/Globulin Ratio: 1.4 (ref 1.2–2.2)
Albumin: 4.4 g/dL (ref 4.1–5.1)
Alkaline Phosphatase: 78 IU/L (ref 44–121)
BUN/Creatinine Ratio: 13 (ref 9–20)
BUN: 22 mg/dL — ABNORMAL HIGH (ref 6–20)
Bilirubin Total: 0.3 mg/dL (ref 0.0–1.2)
CO2: 22 mmol/L (ref 20–29)
Calcium: 9.1 mg/dL (ref 8.7–10.2)
Chloride: 97 mmol/L (ref 96–106)
Creatinine, Ser: 1.76 mg/dL — ABNORMAL HIGH (ref 0.76–1.27)
Globulin, Total: 3.2 g/dL (ref 1.5–4.5)
Glucose: 101 mg/dL — ABNORMAL HIGH (ref 70–99)
Potassium: 4.1 mmol/L (ref 3.5–5.2)
Sodium: 135 mmol/L (ref 134–144)
Total Protein: 7.6 g/dL (ref 6.0–8.5)
eGFR: 50 mL/min/{1.73_m2} — ABNORMAL LOW (ref >59)

## 2022-06-25 LAB — HEMOGLOBIN A1C
Est. average glucose Bld gHb Est-mCnc: 148 mg/dL
Hgb A1c MFr Bld: 6.8 % — ABNORMAL HIGH (ref 4.8–5.6)

## 2022-06-25 MED ORDER — GLIPIZIDE 5 MG PO TABS
5.0000 mg | ORAL_TABLET | Freq: Two times a day (BID) | ORAL | 3 refills | Status: DC
  Filled 2022-06-25: qty 60, 30d supply, fill #0
  Filled 2022-07-28: qty 60, 30d supply, fill #1

## 2022-06-26 ENCOUNTER — Other Ambulatory Visit (HOSPITAL_COMMUNITY): Payer: Self-pay

## 2022-07-14 ENCOUNTER — Encounter (HOSPITAL_BASED_OUTPATIENT_CLINIC_OR_DEPARTMENT_OTHER): Payer: Self-pay | Admitting: Emergency Medicine

## 2022-07-14 ENCOUNTER — Emergency Department (HOSPITAL_BASED_OUTPATIENT_CLINIC_OR_DEPARTMENT_OTHER)
Admission: EM | Admit: 2022-07-14 | Discharge: 2022-07-14 | Disposition: A | Discharge status: Home / Self Care | Payer: Self-pay | Attending: Emergency Medicine | Admitting: Emergency Medicine

## 2022-07-14 ENCOUNTER — Other Ambulatory Visit: Payer: Self-pay

## 2022-07-14 ENCOUNTER — Encounter: Payer: Self-pay | Admitting: Critical Care Medicine

## 2022-07-14 DIAGNOSIS — Z7984 Long term (current) use of oral hypoglycemic drugs: Secondary | ICD-10-CM | POA: Insufficient documentation

## 2022-07-14 DIAGNOSIS — Z79899 Other long term (current) drug therapy: Secondary | ICD-10-CM | POA: Insufficient documentation

## 2022-07-14 DIAGNOSIS — I110 Hypertensive heart disease with heart failure: Secondary | ICD-10-CM | POA: Insufficient documentation

## 2022-07-14 DIAGNOSIS — F419 Anxiety disorder, unspecified: Secondary | ICD-10-CM | POA: Diagnosis not present

## 2022-07-14 DIAGNOSIS — R197 Diarrhea, unspecified: Secondary | ICD-10-CM | POA: Diagnosis not present

## 2022-07-14 DIAGNOSIS — I509 Heart failure, unspecified: Secondary | ICD-10-CM | POA: Insufficient documentation

## 2022-07-14 DIAGNOSIS — Z1152 Encounter for screening for COVID-19: Secondary | ICD-10-CM | POA: Insufficient documentation

## 2022-07-14 DIAGNOSIS — I10 Essential (primary) hypertension: Secondary | ICD-10-CM | POA: Diagnosis not present

## 2022-07-14 LAB — CBC WITH DIFFERENTIAL/PLATELET
Abs Immature Granulocytes: 0.02 10*3/uL (ref 0.00–0.07)
Basophils Absolute: 0 10*3/uL (ref 0.0–0.1)
Basophils Relative: 0 %
Eosinophils Absolute: 0.2 10*3/uL (ref 0.0–0.5)
Eosinophils Relative: 3 %
HCT: 33.6 % — ABNORMAL LOW (ref 39.0–52.0)
Hemoglobin: 10.8 g/dL — ABNORMAL LOW (ref 13.0–17.0)
Immature Granulocytes: 0 %
Lymphocytes Relative: 37 %
Lymphs Abs: 2.4 10*3/uL (ref 0.7–4.0)
MCH: 23.3 pg — ABNORMAL LOW (ref 26.0–34.0)
MCHC: 32.1 g/dL (ref 30.0–36.0)
MCV: 72.6 fL — ABNORMAL LOW (ref 80.0–100.0)
Monocytes Absolute: 0.5 10*3/uL (ref 0.1–1.0)
Monocytes Relative: 8 %
Neutro Abs: 3.4 10*3/uL (ref 1.7–7.7)
Neutrophils Relative %: 52 %
Platelets: 254 10*3/uL (ref 150–400)
RBC: 4.63 MIL/uL (ref 4.22–5.81)
RDW: 15.7 % — ABNORMAL HIGH (ref 11.5–15.5)
WBC: 6.5 10*3/uL (ref 4.0–10.5)
nRBC: 0 % (ref 0.0–0.2)

## 2022-07-14 LAB — T4, FREE: Free T4: 1.02 ng/dL (ref 0.61–1.12)

## 2022-07-14 LAB — TSH: TSH: 1.186 u[IU]/mL (ref 0.350–4.500)

## 2022-07-14 LAB — COMPREHENSIVE METABOLIC PANEL
ALT: 12 U/L (ref 0–44)
AST: 17 U/L (ref 15–41)
Albumin: 3.6 g/dL (ref 3.5–5.0)
Alkaline Phosphatase: 56 U/L (ref 38–126)
Anion gap: 6 (ref 5–15)
BUN: 20 mg/dL (ref 6–20)
CO2: 23 mmol/L (ref 22–32)
Calcium: 8.5 mg/dL — ABNORMAL LOW (ref 8.9–10.3)
Chloride: 105 mmol/L (ref 98–111)
Creatinine, Ser: 1.69 mg/dL — ABNORMAL HIGH (ref 0.61–1.24)
GFR, Estimated: 53 mL/min — ABNORMAL LOW (ref >60)
Glucose, Bld: 102 mg/dL — ABNORMAL HIGH (ref 70–99)
Potassium: 3.6 mmol/L (ref 3.5–5.1)
Sodium: 134 mmol/L — ABNORMAL LOW (ref 135–145)
Total Bilirubin: 0.6 mg/dL (ref 0.3–1.2)
Total Protein: 7.5 g/dL (ref 6.5–8.1)

## 2022-07-14 LAB — RESP PANEL BY RT-PCR (RSV, FLU A&B, COVID)  RVPGX2
Influenza A by PCR: NEGATIVE
Influenza B by PCR: NEGATIVE
Resp Syncytial Virus by PCR: NEGATIVE
SARS Coronavirus 2 by RT PCR: NEGATIVE

## 2022-07-14 LAB — LIPASE, BLOOD: Lipase: 34 U/L (ref 11–51)

## 2022-07-14 LAB — MAGNESIUM: Magnesium: 1.8 mg/dL (ref 1.7–2.4)

## 2022-07-14 MED ORDER — LACTATED RINGERS IV BOLUS
1000.0000 mL | Freq: Once | INTRAVENOUS | Status: AC
  Administered 2022-07-14: 1000 mL via INTRAVENOUS

## 2022-07-14 NOTE — Discharge Instructions (Addendum)
Recommend you follow-up with your PCP for further management of anger and anxiety.  There is no evidence of electrolyte abnormality and your EKG was reassuring today.  Your thyroid function testing is pending and your COVID-19 and influenza testing is pending.  Given your diarrhea, recommend you continue to orally rehydrate yourself at home.  ECP can potentially prescribe an SSRI which is an antidepressant that also works for treating symptoms of anxiety.

## 2022-07-14 NOTE — ED Provider Notes (Signed)
[place] [facility] AT [facility] Provider Note   CSN: [id number] Arrival date & time: 07/14/22  [id number]     History  Chief Complaint  Patient presents with   Diarrhea   Fatigue    Derek Reed is a 37 y.o. male.   Diarrhea    37 year old male with medical history significant for anxiety, CHF, hypertension, obesity who presents to the emergency department with a jittery sensation in the setting of a diarrhea episode this morning.  Patient states that Derek Reed woke up and experienced palpitations and jitteriness associated with his prior anxiety.  Symptoms lasted for around 20 minutes.  Derek Reed denied any chest pain or shortness of breath at this time.  Symptoms have since resolved.  Derek Reed also had an episode of loose stools earlier today.  No cough, fever or chills.  Derek Reed denies any abdominal pain, no nausea or vomiting.  Derek Reed used to be on benzodiazepines for anxiety but is not taking them.  Derek Reed is not on an SSRI.  Derek Reed denies any SI, HI or AVH.  Derek Reed has an outpatient PCP Derek Reed can follow-up with.  Home Medications Prior to Admission medications   Medication Sig Start Date End Date Taking? Authorizing Provider  glipiZIDE (GLUCOTROL) 5 MG tablet Take 1 tablet (5 mg total) by mouth 2 (two) times daily before a meal. 06/25/22   McClung, Dionne Bucy, PA-C  amLODipine (NORVASC) 10 MG tablet Take 0.5 tablets (5 mg total) by mouth daily. 06/24/22   Argentina Donovan, PA-C  atorvastatin (LIPITOR) 10 MG tablet Take 1 tablet (10 mg total) by mouth daily. 06/24/22   Argentina Donovan, PA-C  Blood Pressure Monitoring (BLOOD PRESSURE CUFF) MISC Use daily. 06/24/22   Argentina Donovan, PA-C  carvedilol (COREG) 12.5 MG tablet Take 1 tablet by mouth 2 times daily with a meal. 06/24/22 06/24/23  Argentina Donovan, PA-C  chlorthalidone (HYGROTON) 25 MG tablet Take 1 tablet (25 mg total) by mouth daily. 06/24/22   Argentina Donovan, PA-C  isosorbide-hydrALAZINE (BIDIL) 20-37.5 MG tablet Take 2 tablets by mouth 2  (two) times daily. 06/24/22 06/24/23  Argentina Donovan, PA-C  nitroGLYCERIN (NITROSTAT) 0.4 MG SL tablet Place 1 tablet (0.4 mg total) under the tongue every 5 (five) minutes as needed for chest pain. Patient not taking: Reported on 12/02/2021 10/27/21   Elsie Stain, MD  potassium chloride SA (KLOR-CON M) 20 MEQ tablet Take 1 tablet by mouth 2 times daily. 06/24/22   Argentina Donovan, PA-C  sacubitril-valsartan (ENTRESTO) 97-103 MG Take 1 tablet by mouth 2 (two) times daily. 06/24/22   Argentina Donovan, PA-C  spironolactone (ALDACTONE) 25 MG tablet Take 1 tablet (25 mg total) by mouth daily. 06/24/22 06/24/23  Argentina Donovan, PA-C      Allergies    Patient has no known allergies.    Review of Systems   Review of Systems  Gastrointestinal:  Positive for diarrhea.  Psychiatric/Behavioral:  The patient is nervous/anxious.   All other systems reviewed and are negative.   Physical Exam Updated Vital Signs BP 107/71   Pulse 66   Temp 98.1 F (36.7 C) (Oral)   Resp 18   SpO2 98%  Physical Exam Vitals and nursing note reviewed.  Constitutional:      General: Derek Reed is not in acute distress.    Appearance: Derek Reed is well-developed.  HENT:     Head: Normocephalic and atraumatic.  Eyes:     Conjunctiva/sclera: Conjunctivae normal.  Cardiovascular:  Rate and Rhythm: Normal rate and regular rhythm.     Heart sounds: No murmur heard. Pulmonary:     Effort: Pulmonary effort is normal. No respiratory distress.     Breath sounds: Normal breath sounds.  Abdominal:     Palpations: Abdomen is soft.     Tenderness: There is no abdominal tenderness.  Musculoskeletal:        General: No swelling.     Cervical back: Neck supple.  Skin:    General: Skin is warm and dry.     Capillary Refill: Capillary refill takes less than 2 seconds.  Neurological:     Mental Status: Derek Reed is alert.  Psychiatric:        Attention and Perception: Attention and perception normal.        Mood and Affect: Mood  is anxious.        Speech: Speech normal.        Behavior: Behavior normal. Behavior is cooperative.        Thought Content: Thought content normal. Thought content does not include homicidal or suicidal ideation.     ED Results / Procedures / Treatments   Labs (all labs ordered are listed, but only abnormal results are displayed) Labs Reviewed  CBC WITH DIFFERENTIAL/PLATELET - Abnormal; Notable for the following components:      Result Value   Hemoglobin 10.8 (*)    HCT 33.6 (*)    MCV 72.6 (*)    MCH 23.3 (*)    RDW 15.7 (*)    All other components within normal limits  COMPREHENSIVE METABOLIC PANEL - Abnormal; Notable for the following components:   Sodium 134 (*)    Glucose, Bld 102 (*)    Creatinine, Ser 1.69 (*)    Calcium 8.5 (*)    GFR, Estimated 53 (*)    All other components within normal limits  RESP PANEL BY RT-PCR (RSV, FLU A&B, COVID)  RVPGX2  LIPASE, BLOOD  MAGNESIUM  TSH  T4, FREE    EKG EKG Interpretation  Date/Time:  Tuesday July 14 2022 08:25:12 EST Ventricular Rate:  63 PR Interval:  210 QRS Duration: 101 QT Interval:  388 QTC Calculation: 398 R Axis:   2 Text Interpretation: Sinus rhythm Prolonged PR interval No significant change since last tracing Confirmed by Regan Lemming ([id number]) on 07/14/2022 9:12:42 AM  Radiology No results found.  Procedures Procedures    Medications Ordered in ED Medications  lactated ringers bolus 1,000 mL (1,000 mLs Intravenous New Bag/Given 07/14/22 0920)    ED Course/ Medical Decision Making/ A&P                             Medical Decision Making Amount and/or Complexity of Data Reviewed Labs: ordered.   36 year old male with medical history significant for anxiety, CHF, hypertension, obesity who presents to the emergency department with a jittery sensation in the setting of a diarrhea episode this morning.  Patient states that Derek Reed woke up and experienced palpitations and jitteriness associated with his  prior anxiety.  Symptoms lasted for around 20 minutes.  Derek Reed denied any chest pain or shortness of breath at this time.  Symptoms have since resolved.  Derek Reed also had an episode of loose stools earlier today.  No cough, fever or chills.  Derek Reed denies any abdominal pain, no nausea or vomiting.  Derek Reed used to be on benzodiazepines for anxiety but is not taking them.  Derek Reed is not  on an SSRI.  Derek Reed denies any SI, HI or AVH.  Derek Reed has an outpatient PCP Derek Reed can follow-up with.  On arrival, the patient was vitally stable.  Physical exam significant for a normal psychiatric exam, mildly anxious patient but otherwise symptoms had improved.  No SI, HI or AVH.  Abdomen soft, nontender, nondistended, no rebound or guarding.  Patient presenting with diarrhea, appears fairly well-hydrated.  Initially started with an IV fluid bolus and screening labs were obtained.  After 500 cc, the patient appeared well-hydrated and was tolerating oral intake.  Screening laboratory evaluation significant for magnesium normal, lipase unremarkable, CMP without significant electrolyte abnormality, elevated serum creatinine at baseline for the patient at 1.69, CBC with a mild anemia to 10.8, close to the patient's baseline, appears to be microcytic.  Thyroid function testing was ordered.  EKG was performed which revealed sinus rhythm, ventricular rate 63, prolonged PR interval, no acute ischemic changes.  Recommended the patient continue oral rehydration at home given his mildly loose stools earlier today.  His abdomen soft, nontender, nondistended, no direct rebound or guarding, reassuring exam, do not think CT imaging is indicated at this time.  Derek Reed is tolerating oral intake With no vomiting.  His electrolytes are reassuring.  His EKG reveals no significantly abnormal intervals other than a mildly prolonged PR interval at 210.  QTc is normal.  No chest pain earlier today.  No shortness of breath, no lower extremity swelling.  Patient Reed appears euvolemic.   Will check thyroid function studies, recommended the patient follow-up with his PCP for discussion of getting on an SSRI for his anxiety.  Final Clinical Impression(s) / ED Diagnoses Final diagnoses:  Diarrhea, unspecified type  Anxiety    Rx / DC Orders ED Discharge Orders     None         Regan Lemming, MD 07/14/22 1007

## 2022-07-14 NOTE — ED Triage Notes (Signed)
Pt arrives pov, steady gait with c/o "feeling jittery" upon waking today. Also reports "feeling weak". Reports diarrhea today. Pt denies fever or shob. Pt AOx4, bilaterally equal

## 2022-07-14 NOTE — ED Notes (Signed)
Discharge paperwork reviewed entirely with patient, including Rx's and follow up care. Pain was under control. Pt verbalized understanding as well as all parties involved. No questions or concerns voiced at the time of discharge. No acute distress noted.   Pt ambulated out to PVA without incident or assistance.

## 2022-07-20 ENCOUNTER — Other Ambulatory Visit: Payer: Self-pay | Admitting: Critical Care Medicine

## 2022-07-20 ENCOUNTER — Other Ambulatory Visit (HOSPITAL_COMMUNITY): Payer: Self-pay

## 2022-07-20 DIAGNOSIS — I5022 Chronic systolic (congestive) heart failure: Secondary | ICD-10-CM

## 2022-07-22 ENCOUNTER — Other Ambulatory Visit (HOSPITAL_COMMUNITY): Payer: Self-pay

## 2022-07-22 ENCOUNTER — Encounter: Payer: Self-pay | Admitting: Critical Care Medicine

## 2022-07-27 ENCOUNTER — Other Ambulatory Visit: Payer: Self-pay | Admitting: Critical Care Medicine

## 2022-07-27 DIAGNOSIS — I10 Essential (primary) hypertension: Secondary | ICD-10-CM

## 2022-07-27 DIAGNOSIS — I5022 Chronic systolic (congestive) heart failure: Secondary | ICD-10-CM

## 2022-07-27 MED ORDER — AMLODIPINE BESYLATE 10 MG PO TABS: 5.0000 mg | ORAL_TABLET | Freq: Every day | ORAL | 2 refills | Status: DC

## 2022-07-31 ENCOUNTER — Ambulatory Visit: Payer: Self-pay | Attending: Critical Care Medicine | Admitting: Pharmacist

## 2022-07-31 ENCOUNTER — Encounter: Payer: Self-pay | Admitting: Pharmacist

## 2022-07-31 VITALS — BP 76/46

## 2022-07-31 DIAGNOSIS — I10 Essential (primary) hypertension: Secondary | ICD-10-CM

## 2022-07-31 NOTE — Progress Notes (Signed)
   S:     No chief complaint on file.  37 y.o. male who presents for hypertension evaluation, education, and management.  PMH is significant for HTN, chronic systolic heart failure (last EF of 55-60% 03/06/21), HLD, CKD II, GAD.Patient was referred and last seen by Freeman Caldron, on 06/24/2022.   At last visit, BP was low and amlodipine dose was decreased to 5 mg. Today, patient arrives in good spirits and presents without assistance. Denies dizziness, headache, blurred vision, swelling. He is compliant with all antihypertensives and is still taking amlodipine 10mg  daily.   Patient reports hypertension is longstanding.   Family/Social history:  -Fhx: HLD, DM, stroke, HTN -Tobacco: former smoker  -Alcohol: none  Medication adherence reported. Patient has taken BP medications today.   Current antihypertensives include: amlodipine 10 mg daily, carvedilol 12.5 mg BID, chlorthalidone 25 mg daily, spironolactone 25 mg daily   Reported home BP readings: none  Patient reported dietary habits:  - Tells me he is making diet changes  - Working on limiting carbs and starches. Eating mostly grilled chicken salads.  Patient-reported exercise habits:  - Doing cardio work at [employer]. Using the treadmill, stairmaster for ~1hour.  - Goal is to go 3-4 days/week  - So far, going 1-2 days/week  O: Vitals:   07/31/22 1027  BP: (!) 76/46   Last 3 Office BP readings: BP Readings from Last 3 Encounters:  07/14/22 107/71  06/24/22 98/68  06/18/22 101/69    BMET    Component Value Date/Time   NA 134 (L) 07/14/2022 0915   NA 135 06/24/2022 1129   K 3.6 07/14/2022 0915   CL 105 07/14/2022 0915   CO2 23 07/14/2022 0915   GLUCOSE 102 (H) 07/14/2022 0915   BUN 20 07/14/2022 0915   BUN 22 (H) 06/24/2022 1129   CREATININE 1.69 (H) 07/14/2022 0915   CREATININE 1.21 03/26/2014 1656   CALCIUM 8.5 (L) 07/14/2022 0915   GFRNONAA 53 (L) 07/14/2022 0915   GFRAA >60 10/30/2019 1356    Renal  function: CrCl cannot be calculated (Unknown ideal weight.).  Clinical ASCVD: No  The ASCVD Risk score (Arnett DK, et al., 2019) failed to calculate for the following reasons:   The 2019 ASCVD risk score is only valid for ages 28 to 21  A/P: Hypertension longstanding currently hypotensive on current medications. BP goal < 130/80 mmHg. Medication adherence appears appropriate, but he never decreased the amlodipine. I think we can hold this for now and continue with carvedilol, chlorthalidone, and spiro. He is asymptomatic at this time.  -Discontinued amlodipine.  -Continued carvedilol, chlorthalidone, and spiro at current doses.  -F/u labs ordered - none today -Counseled on lifestyle modifications for blood pressure control including reduced dietary sodium, increased exercise, adequate sleep. -Encouraged patient to check BP at home and bring log of readings to next visit. Counseled on proper use of home BP cuff.   Results reviewed and written information provided.    Written patient instructions provided. Patient verbalized understanding of treatment plan.  Total time in face to face counseling 20 minutes.    Follow-up:  Pharmacist in 1 month. PCP clinic visit in 10/27/22.    Benard Halsted, PharmD, [facility], [place] [phone]

## 2022-08-17 ENCOUNTER — Other Ambulatory Visit: Payer: Self-pay

## 2022-08-17 ENCOUNTER — Other Ambulatory Visit: Payer: Self-pay | Admitting: Critical Care Medicine

## 2022-08-17 ENCOUNTER — Other Ambulatory Visit (HOSPITAL_COMMUNITY): Payer: Self-pay

## 2022-08-17 DIAGNOSIS — I5022 Chronic systolic (congestive) heart failure: Secondary | ICD-10-CM

## 2022-08-17 MED ORDER — CARVEDILOL 12.5 MG PO TABS
12.5000 mg | ORAL_TABLET | Freq: Two times a day (BID) | ORAL | 0 refills | Status: DC
  Filled 2022-08-17: qty 60, 30d supply, fill #0
  Filled 2022-09-21: qty 60, 30d supply, fill #1
  Filled 2022-11-05: qty 60, 30d supply, fill #2

## 2022-09-04 IMAGING — CR DG CHEST 2V
2 series · 2 of 2 positions shown · non-contrast
Comparison: None.

CLINICAL DATA: Chest pain

EXAM:
CHEST - 2 VIEW

[w chest pa]
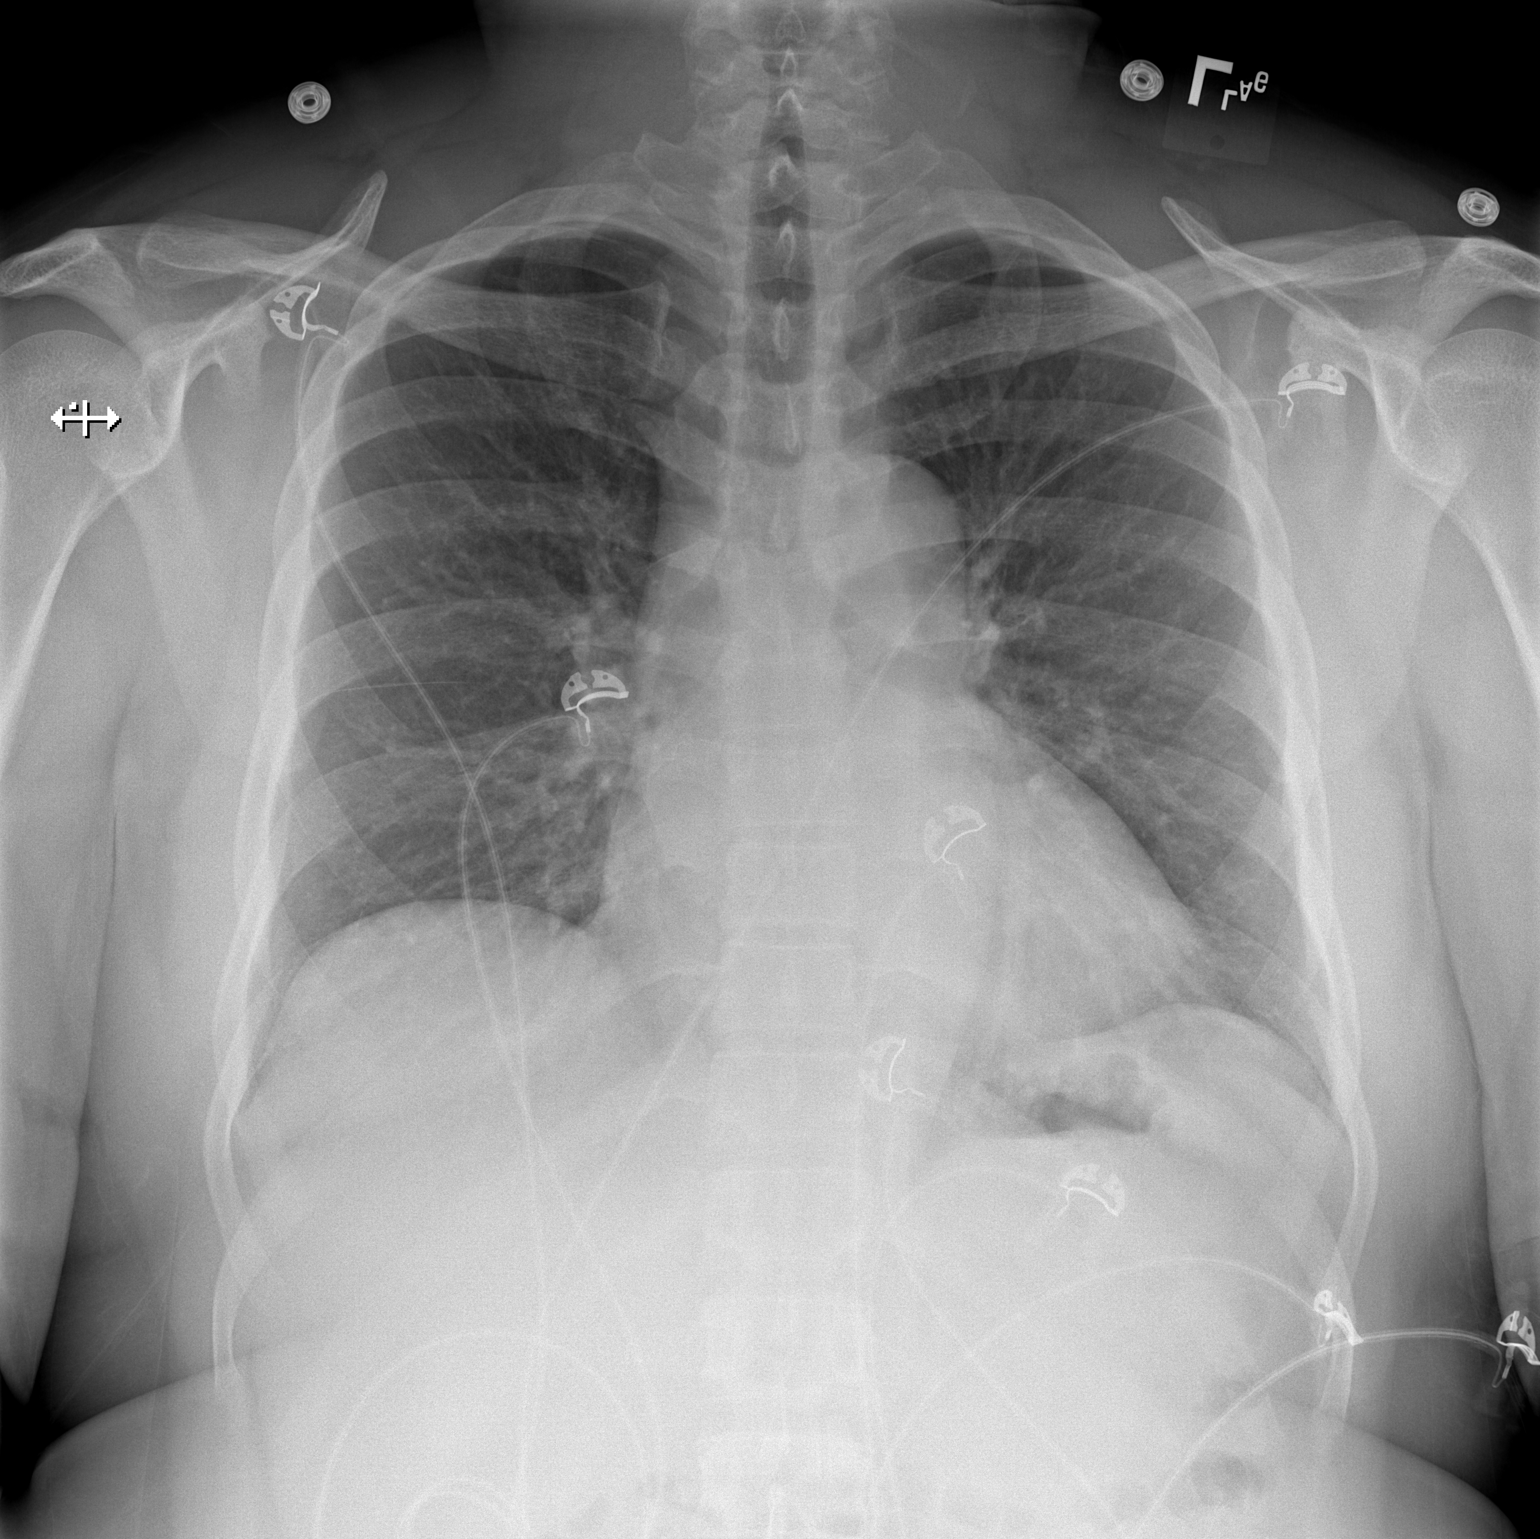

[w chest lat]
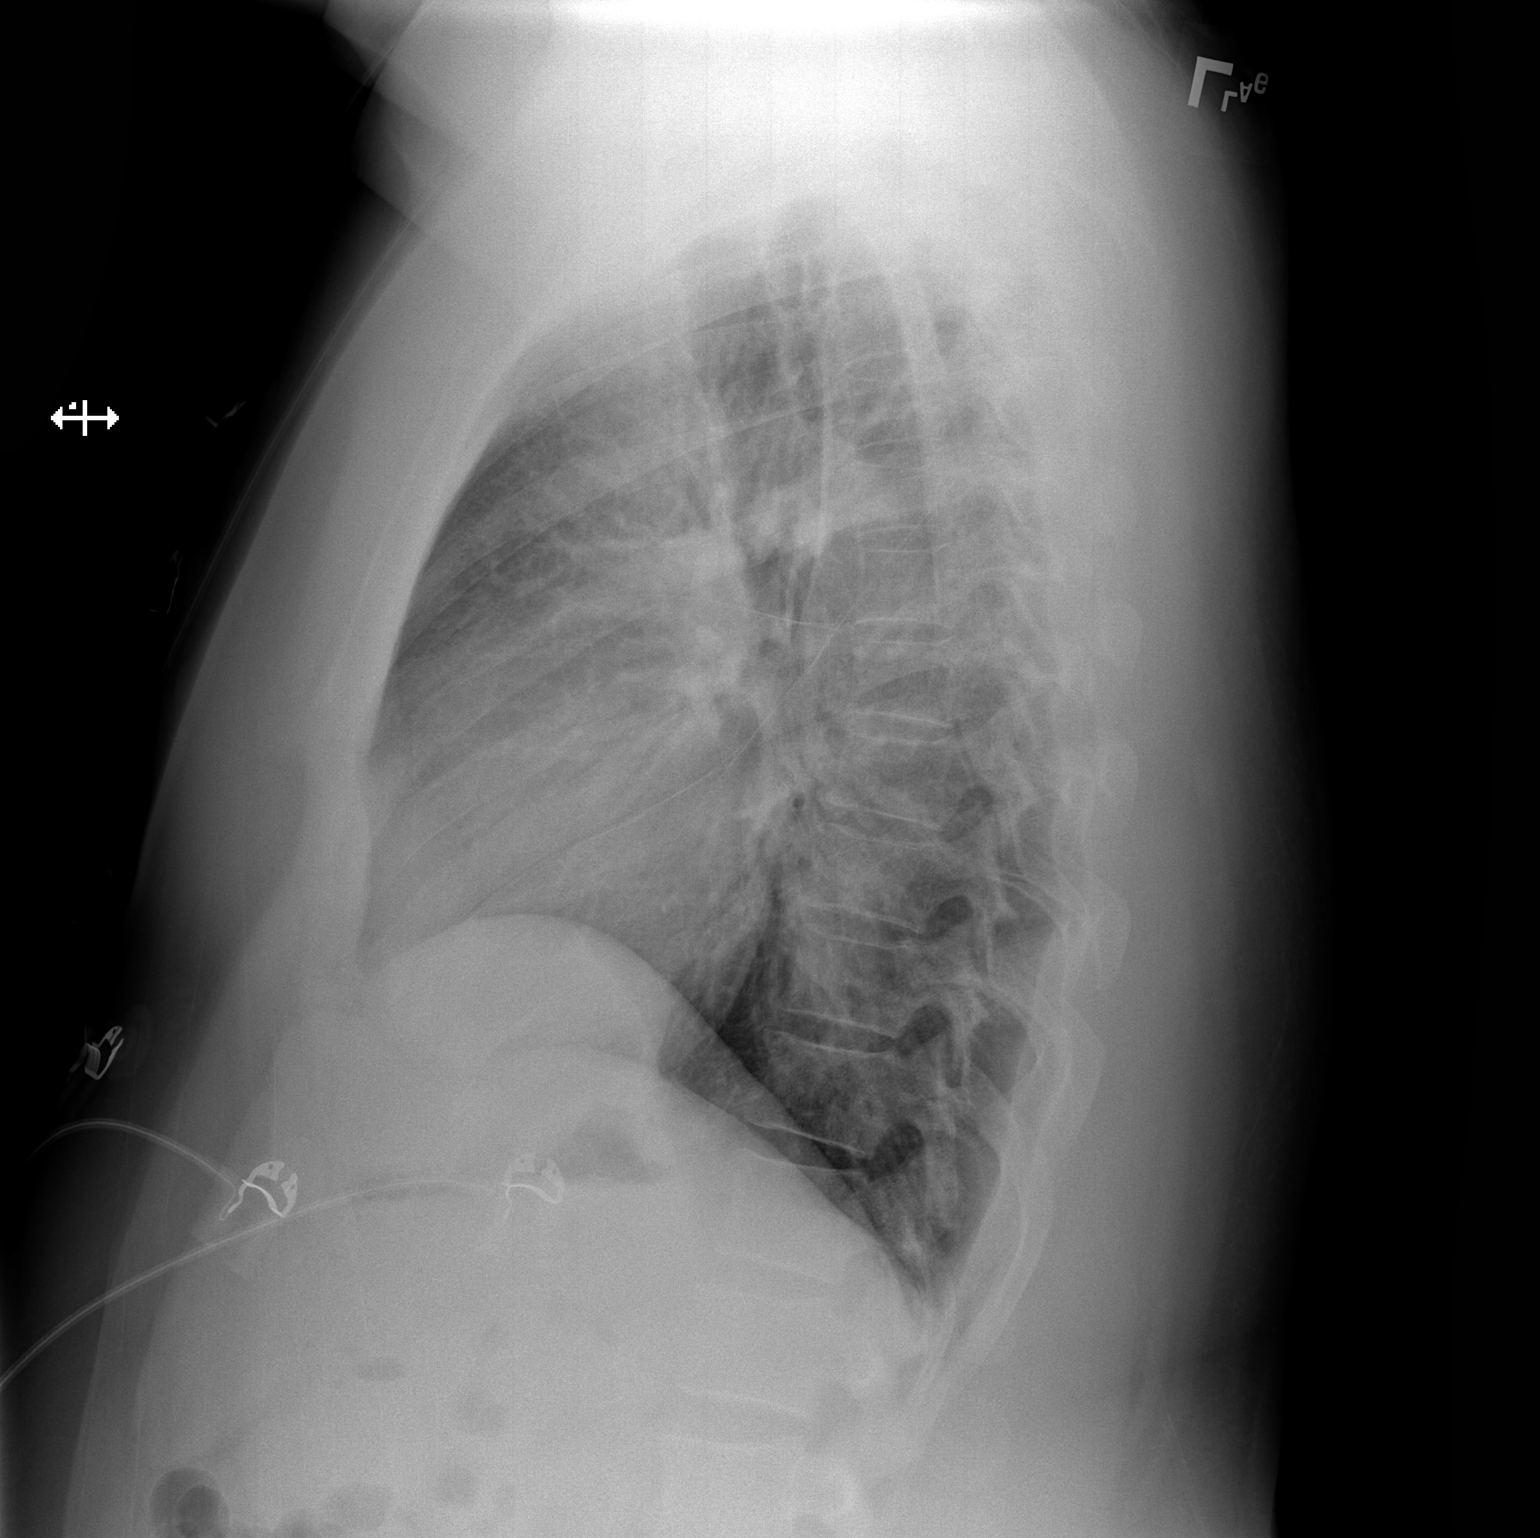

[2 of 2 positions shown; findings below may reference images not displayed]

FINDINGS: The heart size and mediastinal contours are within normal limits.
Both lungs are clear. The visualized skeletal structures are
unremarkable.
IMPRESSION: No active cardiopulmonary disease.

## 2022-09-21 ENCOUNTER — Other Ambulatory Visit: Payer: Self-pay | Admitting: Physician Assistant

## 2022-09-21 ENCOUNTER — Other Ambulatory Visit (HOSPITAL_COMMUNITY): Payer: Self-pay

## 2022-09-21 ENCOUNTER — Other Ambulatory Visit: Payer: Self-pay

## 2022-09-21 DIAGNOSIS — I5022 Chronic systolic (congestive) heart failure: Secondary | ICD-10-CM

## 2022-09-21 DIAGNOSIS — I10 Essential (primary) hypertension: Secondary | ICD-10-CM

## 2022-09-21 MED ORDER — ENTRESTO 97-103 MG PO TABS
1.0000 | ORAL_TABLET | Freq: Two times a day (BID) | ORAL | 0 refills | Status: DC
Start: 2022-09-21 — End: 2022-11-02
  Filled 2022-09-21: qty 60, 30d supply, fill #0

## 2022-10-01 ENCOUNTER — Ambulatory Visit: Payer: Self-pay | Admitting: Pharmacist

## 2022-10-02 ENCOUNTER — Encounter: Payer: Self-pay | Admitting: Critical Care Medicine

## 2022-10-11 ENCOUNTER — Encounter: Payer: Self-pay | Admitting: Critical Care Medicine

## 2022-10-11 DIAGNOSIS — G8929 Other chronic pain: Secondary | ICD-10-CM

## 2022-10-20 ENCOUNTER — Ambulatory Visit: Payer: Commercial Managed Care - PPO | Admitting: Orthopaedic Surgery

## 2022-10-20 ENCOUNTER — Other Ambulatory Visit (INDEPENDENT_AMBULATORY_CARE_PROVIDER_SITE_OTHER): Payer: Commercial Managed Care - PPO

## 2022-10-20 DIAGNOSIS — G8929 Other chronic pain: Secondary | ICD-10-CM

## 2022-10-20 DIAGNOSIS — M25512 Pain in left shoulder: Secondary | ICD-10-CM

## 2022-10-20 NOTE — Progress Notes (Signed)
Office Visit Note   Patient: Derek Reed           Date of Birth: 11-Aug-1985           MRN: [id number] Visit Date: 10/20/2022              Requested by: Storm Frisk, MD [address] [place],  [place] [address] PCP: Storm Frisk, MD   Assessment & Plan: Visit Diagnoses:  1. Chronic left shoulder pain     Plan: Impression is 37 year old gentleman with chronic left shoulder pain that I feel is related to biceps tendinitis.  Based on treatment options, he would like to try an ultrasound guided bicipital groove injection.  We'll send him to Dr. Shon Baton.  He'll follow up if symptoms persist beyond 6 weeks from now.  Follow-Up Instructions: No follow-ups on file.   Orders:  Orders Placed This Encounter  Procedures   XR Shoulder Left   No orders of the defined types were placed in this encounter.     Procedures: No procedures performed   Clinical Data: No additional findings.   Subjective: Chief Complaint  Patient presents with   Left Shoulder - Pain    HPI Mr. Derek Reed is a 37 year old gentleman here for evaluation of left shoulder pain.  He states that he has pain to the anterior aspect of the shoulder.  Works as a Museum/gallery exhibitions officer.  He is left-hand dominant.  Sometimes has pain with range of motion.  Had a remote injury to the left shoulder 22 years ago. Review of Systems  Constitutional: Negative.   HENT: Negative.    Eyes: Negative.   Respiratory: Negative.    Cardiovascular: Negative.   Gastrointestinal: Negative.   Endocrine: Negative.   Genitourinary: Negative.   Skin: Negative.   Allergic/Immunologic: Negative.   Neurological: Negative.   Hematological: Negative.   Psychiatric/Behavioral: Negative.    All other systems reviewed and are negative.    Objective: Vital Signs: There were no vitals taken for this visit.  Physical Exam Vitals and nursing note reviewed.  Constitutional:      Appearance: He is well-developed.  HENT:      Head: Normocephalic and atraumatic.  Eyes:     Pupils: Pupils are equal, round, and reactive to light.  Pulmonary:     Effort: Pulmonary effort is normal.  Abdominal:     Palpations: Abdomen is soft.  Musculoskeletal:        General: Normal range of motion.     Cervical back: Neck supple.  Skin:    General: Skin is warm.  Neurological:     Mental Status: He is alert and oriented to person, place, and time.  Psychiatric:        Behavior: Behavior normal.        Thought Content: Thought content normal.        Judgment: Judgment normal.    Ortho Exam Examination of left shoulder shows full passive and active range of motion.  Rotator cuff is normal to manual muscle testing.  He has quite a bit of tenderness to the bicipital groove.  Negative speeds sign. Specialty Comments:  No specialty comments available.  Imaging: XR Shoulder Left  Result Date: 10/20/2022 Three-view x-rays of the left shoulder show no acute or structural abnormalities    PMFS History: Patient Active Problem List   Diagnosis Date Noted   Mixed hyperlipidemia 11/19/2021   HTN (hypertension) 02/24/2019   CKD (chronic kidney disease), stage II  02/24/2019   Chronic systolic heart failure (HCC)due to Hypertension    GAD (generalized anxiety disorder) 03/26/2014   BMI 36.0-36.9,adult 03/26/2014   Past Medical History:  Diagnosis Date   Acute CHF (congestive heart failure) (HCC) 02/24/2019   Anxiety    Hypertension    Obesity    Peritonsillar abscess     Family History  Problem Relation Age of Onset   Hyperlipidemia Mother    Diabetes Mother    Stroke Mother    Hypertension Mother    Hypertension Father     Past Surgical History:  Procedure Laterality Date   INCISION AND DRAINAGE OF PERITONSILLAR ABCESS     RIGHT/LEFT HEART CATH AND CORONARY ANGIOGRAPHY N/A 02/24/2019   Procedure: RIGHT/LEFT HEART CATH AND CORONARY ANGIOGRAPHY;  Surgeon: Laurey Morale, MD;  Location: [facility];   Service: Cardiovascular;  Laterality: N/A;   Social History   Occupational History   Not on file  Tobacco Use   Smoking status: Former   Smokeless tobacco: Never  Vaping Use   Vaping Use: Never used  Substance and Sexual Activity   Alcohol use: No   Drug use: No   Sexual activity: Not on file

## 2022-10-23 ENCOUNTER — Other Ambulatory Visit (HOSPITAL_COMMUNITY): Payer: Self-pay

## 2022-10-23 ENCOUNTER — Other Ambulatory Visit: Payer: Self-pay

## 2022-10-23 ENCOUNTER — Encounter: Payer: Self-pay | Admitting: Critical Care Medicine

## 2022-10-23 DIAGNOSIS — I5022 Chronic systolic (congestive) heart failure: Secondary | ICD-10-CM

## 2022-10-23 DIAGNOSIS — I10 Essential (primary) hypertension: Secondary | ICD-10-CM

## 2022-10-24 MED ORDER — CHLORTHALIDONE 25 MG PO TABS: 25.0000 mg | ORAL_TABLET | Freq: Every day | ORAL | 3 refills | Status: DC

## 2022-10-24 NOTE — Telephone Encounter (Signed)
Med refill

## 2022-10-27 ENCOUNTER — Other Ambulatory Visit (HOSPITAL_COMMUNITY): Payer: Self-pay

## 2022-10-27 ENCOUNTER — Ambulatory Visit: Payer: Commercial Managed Care - PPO | Admitting: Critical Care Medicine

## 2022-10-30 ENCOUNTER — Ambulatory Visit: Payer: Commercial Managed Care - PPO | Admitting: Sports Medicine

## 2022-11-02 ENCOUNTER — Other Ambulatory Visit (HOSPITAL_COMMUNITY): Payer: Self-pay

## 2022-11-02 MED ORDER — ENTRESTO 97-103 MG PO TABS
1.0000 | ORAL_TABLET | Freq: Two times a day (BID) | ORAL | 0 refills | Status: DC
Start: 2022-11-02 — End: 2022-12-11
  Filled 2022-11-02: qty 60, 30d supply, fill #0

## 2022-11-02 NOTE — Addendum Note (Signed)
Addended by: Shan Levans E on: 11/02/2022 11:01 AM   Modules accepted: Orders

## 2022-11-02 NOTE — Telephone Encounter (Signed)
Please get this patient in for f/u PCP appt

## 2022-11-03 ENCOUNTER — Encounter (HOSPITAL_BASED_OUTPATIENT_CLINIC_OR_DEPARTMENT_OTHER): Payer: Self-pay

## 2022-11-03 ENCOUNTER — Emergency Department (HOSPITAL_BASED_OUTPATIENT_CLINIC_OR_DEPARTMENT_OTHER): Payer: Commercial Managed Care - PPO

## 2022-11-03 ENCOUNTER — Ambulatory Visit: Payer: Commercial Managed Care - PPO | Admitting: Critical Care Medicine

## 2022-11-03 ENCOUNTER — Other Ambulatory Visit: Payer: Self-pay

## 2022-11-03 ENCOUNTER — Other Ambulatory Visit (HOSPITAL_COMMUNITY): Payer: Self-pay

## 2022-11-03 ENCOUNTER — Emergency Department (HOSPITAL_BASED_OUTPATIENT_CLINIC_OR_DEPARTMENT_OTHER)
Admission: EM | Admit: 2022-11-03 | Discharge: 2022-11-03 | Disposition: A | Discharge status: Home / Self Care | Payer: Commercial Managed Care - PPO | Attending: Emergency Medicine | Admitting: Emergency Medicine

## 2022-11-03 DIAGNOSIS — K8020 Calculus of gallbladder without cholecystitis without obstruction: Secondary | ICD-10-CM | POA: Diagnosis not present

## 2022-11-03 DIAGNOSIS — I509 Heart failure, unspecified: Secondary | ICD-10-CM | POA: Insufficient documentation

## 2022-11-03 DIAGNOSIS — I110 Hypertensive heart disease with heart failure: Secondary | ICD-10-CM | POA: Insufficient documentation

## 2022-11-03 DIAGNOSIS — Z79899 Other long term (current) drug therapy: Secondary | ICD-10-CM | POA: Diagnosis not present

## 2022-11-03 DIAGNOSIS — R1011 Right upper quadrant pain: Secondary | ICD-10-CM | POA: Diagnosis present

## 2022-11-03 LAB — URINALYSIS, ROUTINE W REFLEX MICROSCOPIC
Bilirubin Urine: NEGATIVE
Glucose, UA: 100 mg/dL — AB
Hgb urine dipstick: NEGATIVE
Ketones, ur: NEGATIVE mg/dL
Leukocytes,Ua: NEGATIVE
Nitrite: NEGATIVE
Protein, ur: NEGATIVE mg/dL
Specific Gravity, Urine: >=1.03 (ref 1.005–1.030)
pH: 7 (ref 5.0–8.0)

## 2022-11-03 LAB — CBC
HCT: 37.7 % — ABNORMAL LOW (ref 39.0–52.0)
Hemoglobin: 11.9 g/dL — ABNORMAL LOW (ref 13.0–17.0)
MCH: 23.1 pg — ABNORMAL LOW (ref 26.0–34.0)
MCHC: 31.6 g/dL (ref 30.0–36.0)
MCV: 73.2 fL — ABNORMAL LOW (ref 80.0–100.0)
Platelets: 270 10*3/uL (ref 150–400)
RBC: 5.15 MIL/uL (ref 4.22–5.81)
RDW: 15.8 % — ABNORMAL HIGH (ref 11.5–15.5)
WBC: 11.6 10*3/uL — ABNORMAL HIGH (ref 4.0–10.5)
nRBC: 0 % (ref 0.0–0.2)

## 2022-11-03 LAB — COMPREHENSIVE METABOLIC PANEL
ALT: 20 U/L (ref 0–44)
AST: 18 U/L (ref 15–41)
Albumin: 3.8 g/dL (ref 3.5–5.0)
Alkaline Phosphatase: 78 U/L (ref 38–126)
Anion gap: 7 (ref 5–15)
BUN: 21 mg/dL — ABNORMAL HIGH (ref 6–20)
CO2: 26 mmol/L (ref 22–32)
Calcium: 9.1 mg/dL (ref 8.9–10.3)
Chloride: 102 mmol/L (ref 98–111)
Creatinine, Ser: 1.81 mg/dL — ABNORMAL HIGH (ref 0.61–1.24)
GFR, Estimated: 48 mL/min — ABNORMAL LOW (ref >60)
Glucose, Bld: 118 mg/dL — ABNORMAL HIGH (ref 70–99)
Potassium: 3.6 mmol/L (ref 3.5–5.1)
Sodium: 135 mmol/L (ref 135–145)
Total Bilirubin: 0.5 mg/dL (ref 0.3–1.2)
Total Protein: 8 g/dL (ref 6.5–8.1)

## 2022-11-03 LAB — LIPASE, BLOOD: Lipase: 38 U/L (ref 11–51)

## 2022-11-03 MED ORDER — IBUPROFEN 800 MG PO TABS
800.0000 mg | ORAL_TABLET | Freq: Three times a day (TID) | ORAL | 0 refills | Status: DC | PRN
  Filled 2022-11-03: qty 21, 7d supply, fill #0

## 2022-11-03 MED ORDER — IOHEXOL 300 MG/ML  SOLN
125.0000 mL | Freq: Once | INTRAMUSCULAR | Status: AC | PRN
  Administered 2022-11-03: 125 mL via INTRAVENOUS

## 2022-11-03 MED ORDER — ONDANSETRON HCL 4 MG/2ML IJ SOLN
4.0000 mg | Freq: Once | INTRAMUSCULAR | Status: AC
  Administered 2022-11-03: 4 mg via INTRAVENOUS
  Filled 2022-11-03: qty 2

## 2022-11-03 MED ORDER — SODIUM CHLORIDE 0.9 % IV BOLUS
1000.0000 mL | Freq: Once | INTRAVENOUS | Status: AC
  Administered 2022-11-03: 1000 mL via INTRAVENOUS

## 2022-11-03 MED ORDER — IBUPROFEN 800 MG PO TABS: 800.0000 mg | ORAL_TABLET | Freq: Three times a day (TID) | ORAL | 0 refills | Status: DC | PRN

## 2022-11-03 MED ORDER — KETOROLAC TROMETHAMINE 15 MG/ML IJ SOLN
15.0000 mg | Freq: Once | INTRAMUSCULAR | Status: AC
  Administered 2022-11-03: 15 mg via INTRAVENOUS
  Filled 2022-11-03: qty 1

## 2022-11-03 MED ORDER — ONDANSETRON HCL 4 MG PO TABS
4.0000 mg | ORAL_TABLET | Freq: Four times a day (QID) | ORAL | 0 refills | Status: DC
  Filled 2022-11-03: qty 12, 3d supply, fill #0

## 2022-11-03 MED ORDER — MORPHINE SULFATE (PF) 4 MG/ML IV SOLN
4.0000 mg | Freq: Once | INTRAVENOUS | Status: AC
  Administered 2022-11-03: 4 mg via INTRAVENOUS
  Filled 2022-11-03: qty 1

## 2022-11-03 MED ORDER — MORPHINE SULFATE (PF) 2 MG/ML IV SOLN
2.0000 mg | Freq: Once | INTRAVENOUS | Status: AC
  Administered 2022-11-03: 2 mg via INTRAVENOUS
  Filled 2022-11-03: qty 1

## 2022-11-03 MED ORDER — ONDANSETRON HCL 4 MG PO TABS: 4.0000 mg | ORAL_TABLET | Freq: Four times a day (QID) | ORAL | 0 refills | Status: DC

## 2022-11-03 NOTE — ED Provider Notes (Signed)
[facility] Provider Note   CSN: [id number] Arrival date & time: 11/03/22  1451     History  Chief Complaint  Patient presents with   Abdominal Pain    Derek Reed is a 37 y.o. male.  Pt is a 37 yo male with pmh CHF, htn, and anxiety presenting for abdominal pain, nausea, and vomiting that began this morning. Pt admits to RUQ abdominal pain that is constant and severe without radiation. Admits to episode of nonbloody, nonbillious emesis in which he ran to the bathroom and vomited three times in a row. Denies diarrhea. Last BM was this morning and described as normal and nonbloody. Denies prior abdominal surgeries.   CHF was in 2020 when admitted for uncontrolled HTN and tonsillar abscess. Had repeat echo in 2022 demonstrating stable cardiac function.   The history is provided by the patient. No language interpreter was used.  Abdominal Pain Associated symptoms: nausea and vomiting   Associated symptoms: no chest pain, no chills, no cough, no dysuria, no fever, no hematuria, no shortness of breath and no sore throat        Home Medications Prior to Admission medications   Medication Sig Start Date End Date Taking? Authorizing Provider  glipiZIDE (GLUCOTROL) 5 MG tablet Take 1 tablet (5 mg total) by mouth 2 (two) times daily before a meal. 06/25/22   McClung, Marzella Schlein, PA-C  ibuprofen (ADVIL) 800 MG tablet Take 1 tablet (800 mg total) by mouth every 8 (eight) hours as needed for moderate pain. 11/03/22  Yes Edwin Dada P, DO  ondansetron (ZOFRAN) 4 MG tablet Take 1 tablet (4 mg total) by mouth every 6 (six) hours. 11/03/22  Yes Edwin Dada P, DO  atorvastatin (LIPITOR) 10 MG tablet Take 1 tablet (10 mg total) by mouth daily. 06/24/22   Anders Simmonds, PA-C  Blood Pressure Monitor MISC Use to check blood pressure daily 06/24/22   Anders Simmonds, PA-C  carvedilol (COREG) 12.5 MG tablet Take 1 tablet by mouth 2 times daily with a  meal. 08/17/22   Storm Frisk, MD  chlorthalidone (HYGROTON) 25 MG tablet Take 1 tablet (25 mg total) by mouth daily. 06/24/22   Anders Simmonds, PA-C  chlorthalidone (HYGROTON) 25 MG tablet Take 1 tablet (25 mg total) by mouth daily. 10/24/22   Storm Frisk, MD  isosorbide-hydrALAZINE (BIDIL) 20-37.5 MG tablet Take 2 tablets by mouth 2 (two) times daily. 06/24/22 06/24/23  Anders Simmonds, PA-C  nitroGLYCERIN (NITROSTAT) 0.4 MG SL tablet Place 1 tablet (0.4 mg total) under the tongue every 5 (five) minutes as needed for chest pain. Patient not taking: Reported on 12/02/2021 10/27/21   Storm Frisk, MD  potassium chloride SA (KLOR-CON M) 20 MEQ tablet Take 1 tablet by mouth 2 times daily. 06/24/22   Anders Simmonds, PA-C  sacubitril-valsartan (ENTRESTO) 97-103 MG Take 1 tablet by mouth 2 times daily. 11/02/22   Storm Frisk, MD  spironolactone (ALDACTONE) 25 MG tablet Take 1 tablet (25 mg total) by mouth daily. 06/24/22 06/24/23  Anders Simmonds, PA-C      Allergies    Patient has no known allergies.    Review of Systems   Review of Systems  Constitutional:  Negative for chills and fever.  HENT:  Negative for ear pain and sore throat.   Eyes:  Negative for pain and visual disturbance.  Respiratory:  Negative for cough and shortness of breath.   Cardiovascular:  Negative for chest pain and palpitations.  Gastrointestinal:  Positive for abdominal pain, nausea and vomiting.  Genitourinary:  Negative for dysuria and hematuria.  Musculoskeletal:  Negative for arthralgias and back pain.  Skin:  Negative for color change and rash.  Neurological:  Negative for seizures and syncope.  All other systems reviewed and are negative.   Physical Exam Updated Vital Signs BP 123/82   Pulse 78   Temp 98.8 F (37.1 C) (Oral)   Resp 18   Ht 5\' 6"  (1.676 m)   Wt 113.9 kg   SpO2 99%   BMI 40.51 kg/m  Physical Exam Vitals and nursing note reviewed.  Constitutional:      General:  He is not in acute distress.    Appearance: He is well-developed.  HENT:     Head: Normocephalic and atraumatic.  Eyes:     Conjunctiva/sclera: Conjunctivae normal.  Cardiovascular:     Rate and Rhythm: Normal rate and regular rhythm.     Heart sounds: No murmur heard. Pulmonary:     Effort: Pulmonary effort is normal. No respiratory distress.     Breath sounds: Normal breath sounds.  Abdominal:     Palpations: Abdomen is soft.     Tenderness: There is abdominal tenderness in the right upper quadrant and epigastric area.  Musculoskeletal:        General: No swelling.     Cervical back: Neck supple.  Skin:    General: Skin is warm and dry.     Capillary Refill: Capillary refill takes less than 2 seconds.  Neurological:     Mental Status: He is alert.  Psychiatric:        Mood and Affect: Mood normal.     ED Results / Procedures / Treatments   Labs (all labs ordered are listed, but only abnormal results are displayed) Labs Reviewed  COMPREHENSIVE METABOLIC PANEL - Abnormal; Notable for the following components:      Result Value   Glucose, Bld 118 (*)    BUN 21 (*)    Creatinine, Ser 1.81 (*)    GFR, Estimated 48 (*)    All other components within normal limits  CBC - Abnormal; Notable for the following components:   WBC 11.6 (*)    Hemoglobin 11.9 (*)    HCT 37.7 (*)    MCV 73.2 (*)    MCH 23.1 (*)    RDW 15.8 (*)    All other components within normal limits  URINALYSIS, ROUTINE W REFLEX MICROSCOPIC - Abnormal; Notable for the following components:   Glucose, UA 100 (*)    All other components within normal limits  LIPASE, BLOOD    EKG None  Radiology US Abdomen Limited RUQ (LIVER/GB)  Result Date: 11/03/2022 CLINICAL DATA:  Right upper quadrant pain.  Gallstones seen on CT. EXAM: ULTRASOUND ABDOMEN LIMITED RIGHT UPPER QUADRANT COMPARISON:  CT abdomen and pelvis 11/03/2022 FINDINGS: Gallbladder: The gallbladder is again mildly contracted. There are numerous  small echogenic shadowing gallstones corresponding to the gallstones seen on CT. Gallbladder wall thickness measures 3 mm, at the upper limits of normal. The patient has been given pain medicine, precluding a test for a sonographic Murphy's sign. No pericholecystic fluid. Common bile duct: Diameter: 3 mm, within normal limits. No intrahepatic or extrahepatic biliary ductal dilatation. Liver: No focal lesion identified. Within normal limits in parenchymal echogenicity. Portal vein is patent on color Doppler imaging with normal direction of blood flow towards the liver. Other: None. IMPRESSION: 1.  Cholelithiasis.  No sonographic evidence of acute cholecystitis. 2. No biliary ductal dilatation. Electronically Signed   By: Neita Garnet M.D.   On: 11/03/2022 19:11   CT ABDOMEN PELVIS W CONTRAST  Result Date: 11/03/2022 CLINICAL DATA:  Epigastric pain epigastric and ruq abdominal pain with vomiting EXAM: CT ABDOMEN AND PELVIS WITH CONTRAST TECHNIQUE: Multidetector CT imaging of the abdomen and pelvis was performed using the standard protocol following bolus administration of intravenous contrast. RADIATION DOSE REDUCTION: This exam was performed according to the departmental dose-optimization program which includes automated exposure control, adjustment of the mA and/or kV according to patient size and/or use of iterative reconstruction technique. CONTRAST:  OMNIPAQUE IOHEXOL 300 MG/ML  SOLN COMPARISON:  CTA chest 08/12/2021 FINDINGS: Lower chest: No pleural or pericardial effusion. Visualized lung bases clear. Hepatobiliary: Innumerable subcentimeter partially calcified gallstones. No biliary ductal dilatation. No focal liver lesion. Pancreas: Unremarkable. No pancreatic ductal dilatation or surrounding inflammatory changes. Spleen: Normal in size without focal abnormality. Adrenals/Urinary Tract: Adrenal glands are unremarkable. Kidneys are normal, without renal calculi, focal lesion, or hydronephrosis.  Bladder is unremarkable. Stomach/Bowel: Stomach is incompletely distended, unremarkable. Small bowel decompressed. Normal appendix. The colon is partially distended by gas and fecal material, without acute finding. Vascular/Lymphatic: No significant vascular findings are present. No enlarged abdominal or pelvic lymph nodes. Reproductive: Prostate is unremarkable. Other: Bilateral pelvic phleboliths.  No ascites.  No free air. Musculoskeletal: No acute or significant osseous findings. IMPRESSION: 1. Cholelithiasis. 2. No acute findings. Electronically Signed   By: Corlis Leak M.D.   On: 11/03/2022 17:13    Procedures Procedures    Medications Ordered in ED Medications  morphine (PF) 4 MG/ML injection 4 mg (4 mg Intravenous Given 11/03/22 1557)  ondansetron (ZOFRAN) injection 4 mg (4 mg Intravenous Given 11/03/22 1557)  sodium chloride 0.9 % bolus 1,000 mL (0 mLs Intravenous Stopped 11/03/22 1738)  ketorolac (TORADOL) 15 MG/ML injection 15 mg (15 mg Intravenous Given 11/03/22 1637)  morphine (PF) 2 MG/ML injection 2 mg (2 mg Intravenous Given 11/03/22 1637)  iohexol (OMNIPAQUE) 300 MG/ML solution 125 mL (125 mLs Intravenous Contrast Given 11/03/22 1642)    ED Course/ Medical Decision Making/ A&P                             Medical Decision Making Amount and/or Complexity of Data Reviewed Labs: ordered. Radiology: ordered.  Risk Prescription drug management.   37 yo male presenting for RUQ abdominal pain, nausea, and vomiting that began this morning. Pt is Aox3, no acute distress, afebrile, with stable vitals. Physical exam demonstrates soft abdomen with tenderness in the RUQ.   Differential diagnosis: includes but is not limited to cholecystitis, cholelithiasis, pancreatitis, gastroenteritis, appendicitis, etc.   Interventions: -CT abd/pelvis demonstrates cholelithiasis only. No cholecystitis. No appendicitis. No bowel obstruction. No free air.  -RUQ US demonstrates cholelithiasis only.  Normal CBD. Again, no cholecystitis.  -Labs are concerning for mild leukocytosis. Cr elevated but consistent with previous studies. Stable liver profile, lipase, and renal function.  -Morphine and zofran given for symptomatic management.   Patient's pain likely secondary to cholelithiasis. Pt given medication for pain control in ED and for home. Dietary recommendations given. Pt recommend for general surgery follow up if pain does not improve. Patient in no distress and overall condition improved here in the ED. Detailed discussions were had with the patient regarding current findings, and need for close f/u with PCP or on call doctor.  The patient has been instructed to return immediately if the symptoms worsen in any way for re-evaluation. Patient verbalized understanding and is in agreement with current care plan. All questions answered prior to discharge.         Final Clinical Impression(s) / ED Diagnoses Final diagnoses:  Calculus of gallbladder without cholecystitis without obstruction    Rx / DC Orders ED Discharge Orders          Ordered    ondansetron (ZOFRAN) 4 MG tablet  Every 6 hours        11/03/22 1943    ibuprofen (ADVIL) 800 MG tablet  Every 8 hours PRN        11/03/22 1943              Franne Forts, DO 11/03/22 1943

## 2022-11-03 NOTE — Progress Notes (Deleted)
Established Patient Office Visit  Subjective   Patient ID: Derek Reed, male    DOB: 10/11/1985  Age: 37 y.o. MRN: [id number]  No chief complaint on file.   11/10/21 Derek Reed is a 37 year old male with history of Chronic Kidney Disease stage 3, Type 2 Diabetes, Chronic Systolic heart failure, and anxiety who returns to office today. Patient has recovered his ejection fraction, last value 55-60% on  most recent Echo 02/2021. States he has been juicing with fresh fruit recently for his meals. Reports his stress still remains quite high as he is thinking a lot while at work and is interested in behavioral therapy, previously recommended last visit.    Today's blood pressure is 154/87,states he has been compliant with medications amlodipine, Entresto, Coreg, BiDil, potassium, and spironolactone. He has not been taking his pressures at home and plans to buy a machine soon. He does note some recent headaches which happen intermittently and self resolve, he believes this may be stress/work related.   11/03/22 Derek Reed  A/P: Hypertension longstanding currently hypotensive on current medications. BP goal < 130/80 mmHg. Medication adherence appears appropriate, but he never decreased the amlodipine. I think we can hold this for now and continue with carvedilol, chlorthalidone, and spiro. He is asymptomatic at this time.  -Discontinued amlodipine.  -Continued carvedilol, chlorthalidone, and spiro at current doses.      Patient Active Problem List   Diagnosis Date Noted   Mixed hyperlipidemia 11/19/2021   HTN (hypertension) 02/24/2019   CKD (chronic kidney disease), stage II 02/24/2019   Chronic systolic heart failure (HCC)due to Hypertension    GAD (generalized anxiety disorder) 03/26/2014   BMI 36.0-36.9,adult 03/26/2014   Past Medical History:  Diagnosis Date   Acute CHF (congestive heart failure) (HCC) 02/24/2019   Anxiety    Hypertension    Obesity    Peritonsillar abscess     Past Surgical History:  Procedure Laterality Date   INCISION AND DRAINAGE OF PERITONSILLAR ABCESS     RIGHT/LEFT HEART CATH AND CORONARY ANGIOGRAPHY N/A 02/24/2019   Procedure: RIGHT/LEFT HEART CATH AND CORONARY ANGIOGRAPHY;  Surgeon: Laurey Morale, MD;  Location: [facility];  Service: Cardiovascular;  Laterality: N/A;   Social History   Tobacco Use   Smoking status: Former   Smokeless tobacco: Never  Building services engineer Use: Never used  Substance Use Topics   Alcohol use: No   Drug use: No   Social History   Socioeconomic History   Marital status: Married    Spouse name: Not on file   Number of children: Not on file   Years of education: Not on file   Highest education level: Not on file  Occupational History   Not on file  Tobacco Use   Smoking status: Former   Smokeless tobacco: Never  Vaping Use   Vaping Use: Never used  Substance and Sexual Activity   Alcohol use: No   Drug use: No   Sexual activity: Not on file  Other Topics Concern   Not on file  Social History Narrative   Not on file   Social Determinants of Health   Financial Resource Strain: Low Risk  (07/31/2022)   Overall Financial Resource Strain (CARDIA)    Difficulty of Paying Living Expenses: Not very hard  Food Insecurity: No Food Insecurity (07/31/2022)   Hunger Vital Sign    Worried About Running Out of Food in the Last Year: Never true  Ran Out of Food in the Last Year: Never true  Transportation Needs: No Transportation Needs (07/31/2022)   PRAPARE - Administrator, Civil Service (Medical): No    Lack of Transportation (Non-Medical): No  Physical Activity: Insufficiently Active (07/31/2022)   Exercise Vital Sign    Days of Exercise per Week: 2 days    Minutes of Exercise per Session: 60 min  Stress: Stress Concern Present (07/31/2022)   [employer] of Occupational Health - Occupational Stress Questionnaire    Feeling of Stress : To some extent  Social  Connections: Socially Integrated (07/31/2022)   Social Connection and Isolation Panel [NHANES]    Frequency of Communication with Friends and Family: More than three times a week    Frequency of Social Gatherings with Friends and Family: More than three times a week    Attends Religious Services: More than 4 times per year    Active Member of [employer] or Organizations: Yes    Attends Banker Meetings: More than 4 times per year    Marital Status: Married  Catering manager Violence: Unknown (07/31/2022)   Humiliation, Afraid, Rape, and Kick questionnaire    Fear of Current or Ex-Partner: No    Emotionally Abused: Not on file    Physically Abused: No    Sexually Abused: No   Family Status  Relation Name Status   Mother  (Not Specified)   Father  (Not Specified)   Family History  Problem Relation Age of Onset   Hyperlipidemia Mother    Diabetes Mother    Stroke Mother    Hypertension Mother    Hypertension Father    No Known Allergies    Review of Systems  Constitutional: Negative.  Negative for chills, diaphoresis, fever, malaise/fatigue and weight loss.  HENT:  Negative for congestion, hearing loss, nosebleeds, sore throat and tinnitus.   Eyes: Negative.  Negative for blurred vision, photophobia and redness.  Respiratory: Negative.  Negative for cough, hemoptysis, sputum production, shortness of breath, wheezing and stridor.   Cardiovascular: Negative.  Negative for chest pain, palpitations, orthopnea, claudication, leg swelling and PND.  Gastrointestinal: Negative.  Negative for abdominal pain, blood in stool, constipation, diarrhea, heartburn, nausea and vomiting.  Genitourinary: Negative.  Negative for dysuria, flank pain, frequency, hematuria and urgency.  Musculoskeletal: Negative.  Negative for back pain, falls, joint pain, myalgias and neck pain.  Skin: Negative.  Negative for itching and rash.  Neurological:  Positive for headaches. Negative for dizziness,  tingling, tremors, sensory change, speech change, focal weakness, seizures, loss of consciousness and weakness.  Endo/Heme/Allergies: Negative.  Negative for environmental allergies and polydipsia. Does not bruise/bleed easily.  Psychiatric/Behavioral: Negative.  Negative for depression, memory loss, substance abuse and suicidal ideas. The patient is not nervous/anxious and does not have insomnia.       Objective:     There were no vitals taken for this visit. BP Readings from Last 3 Encounters:  07/31/22 (!) 76/46  07/14/22 107/71  06/24/22 98/68   Wt Readings from Last 3 Encounters:  06/24/22 246 lb 6.4 oz (111.8 kg)  06/18/22 235 lb (106.6 kg)  05/08/22 240 lb (108.9 kg)      Physical Exam Vitals reviewed.  Constitutional:      General: He is awake.     Appearance: Normal appearance. He is well-developed and overweight. He is not diaphoretic.  HENT:     Head: Normocephalic and atraumatic.     Nose: No nasal deformity, septal deviation,  mucosal edema or rhinorrhea.     Right Sinus: No maxillary sinus tenderness or frontal sinus tenderness.     Left Sinus: No maxillary sinus tenderness or frontal sinus tenderness.     Mouth/Throat:     Pharynx: No oropharyngeal exudate.  Eyes:     General: Lids are normal. No scleral icterus.    Conjunctiva/sclera: Conjunctivae normal.     Pupils: Pupils are equal, round, and reactive to light.  Neck:     Thyroid: No thyromegaly.     Vascular: No carotid bruit or JVD.     Trachea: Trachea normal. No tracheal tenderness or tracheal deviation.  Cardiovascular:     Rate and Rhythm: Normal rate and regular rhythm.     Chest Wall: PMI is not displaced.     Pulses: Normal pulses. No decreased pulses.     Heart sounds: Normal heart sounds, S1 normal and S2 normal. Heart sounds not distant. No murmur heard.    No systolic murmur is present.     No diastolic murmur is present.     No friction rub. No gallop. No S3 or S4 sounds.  Pulmonary:      Effort: Pulmonary effort is normal. No tachypnea, accessory muscle usage or respiratory distress.     Breath sounds: Normal breath sounds. No stridor. No decreased breath sounds, wheezing, rhonchi or rales.  Chest:     Chest wall: No tenderness.  Abdominal:     General: Bowel sounds are normal. There is no distension.     Palpations: Abdomen is soft. Abdomen is not rigid.     Tenderness: There is no abdominal tenderness. There is no guarding or rebound.  Musculoskeletal:        General: Normal range of motion.     Cervical back: Normal range of motion and neck supple. No edema, erythema or rigidity. No muscular tenderness. Normal range of motion.     Right lower leg: Laceration (anterior lower leg, 4 inch healing laceration, no signs infection) present. 1+ Edema present.     Left lower leg: Normal. No edema.  Lymphadenopathy:     Head:     Right side of head: No submental or submandibular adenopathy.     Left side of head: No submental or submandibular adenopathy.     Cervical: No cervical adenopathy.  Skin:    General: Skin is warm and dry.     Coloration: Skin is not pale.     Findings: No rash.     Nails: There is no clubbing.  Neurological:     General: No focal deficit present.     Mental Status: He is alert and oriented to person, place, and time.     Sensory: No sensory deficit.  Psychiatric:        Attention and Perception: Attention and perception normal.        Mood and Affect: Mood normal.        Speech: Speech normal.        Behavior: Behavior normal. Behavior is cooperative.      No results found for any visits on 11/03/22.  Last CBC Lab Results  Component Value Date   WBC 6.5 07/14/2022   HGB 10.8 (L) 07/14/2022   HCT 33.6 (L) 07/14/2022   MCV 72.6 (L) 07/14/2022   MCH 23.3 (L) 07/14/2022   RDW 15.7 (H) 07/14/2022   PLT 254 07/14/2022   Last metabolic panel Lab Results  Component Value Date   GLUCOSE 102 (H) 07/14/2022  NA 134 (L) 07/14/2022    K 3.6 07/14/2022   CL 105 07/14/2022   CO2 23 07/14/2022   BUN 20 07/14/2022   CREATININE 1.69 (H) 07/14/2022   GFRNONAA 53 (L) 07/14/2022   CALCIUM 8.5 (L) 07/14/2022   PROT 7.5 07/14/2022   ALBUMIN 3.6 07/14/2022   LABGLOB 3.2 06/24/2022   AGRATIO 1.4 06/24/2022   BILITOT 0.6 07/14/2022   ALKPHOS 56 07/14/2022   AST 17 07/14/2022   ALT 12 07/14/2022   ANIONGAP 6 07/14/2022   Last lipids Lab Results  Component Value Date   CHOL 162 11/18/2021   HDL 33 (L) 11/18/2021   LDLCALC 103 (H) 11/18/2021   TRIG 143 11/18/2021   CHOLHDL 4.9 11/18/2021   Last hemoglobin A1c Lab Results  Component Value Date   HGBA1C 6.8 (H) 06/24/2022   Last thyroid functions Lab Results  Component Value Date   TSH 1.186 07/14/2022   Last vitamin D No results found for: "25OHVITD2", "25OHVITD3", "VD25OH"    The ASCVD Risk score (Arnett DK, et al., 2019) failed to calculate for the following reasons:   The 2019 ASCVD risk score is only valid for ages 60 to 81    Assessment & Plan:   Problem List Items Addressed This Visit   None  No follow-ups on file.    Shan Levans, MD

## 2022-11-03 NOTE — ED Triage Notes (Signed)
Patient presents to ED via POV from home. Here with abdominal pain, nausea, vomiting and diarrhea. Symptoms began today.

## 2022-11-04 ENCOUNTER — Other Ambulatory Visit (HOSPITAL_COMMUNITY): Payer: Self-pay

## 2022-11-04 IMAGING — DX DG SHOULDER 2+V*L*
4 series · 4 of 4 positions shown · non-contrast
Comparison: 08/24/2020

CLINICAL DATA: Left shoulder pain for 5 days, worse today

EXAM:
LEFT SHOULDER - 2+ VIEW

[shoulder grashey]
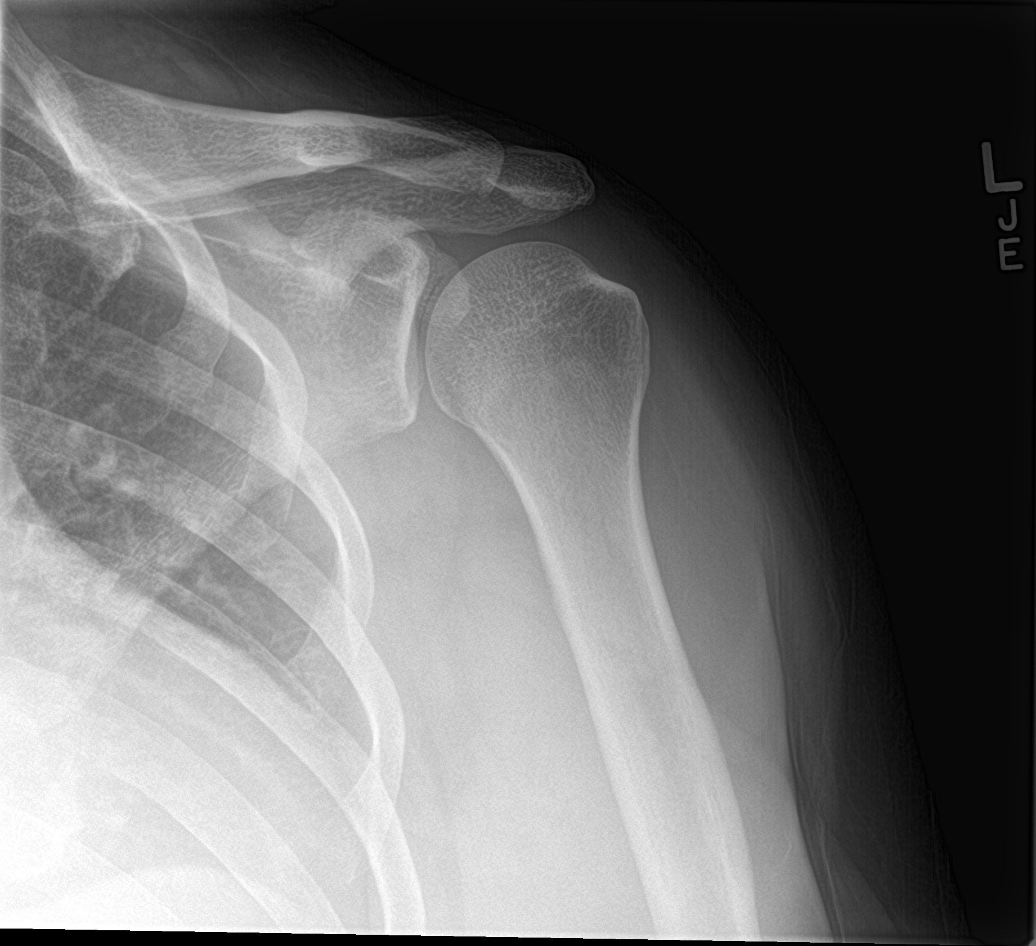

[shoulder y view]
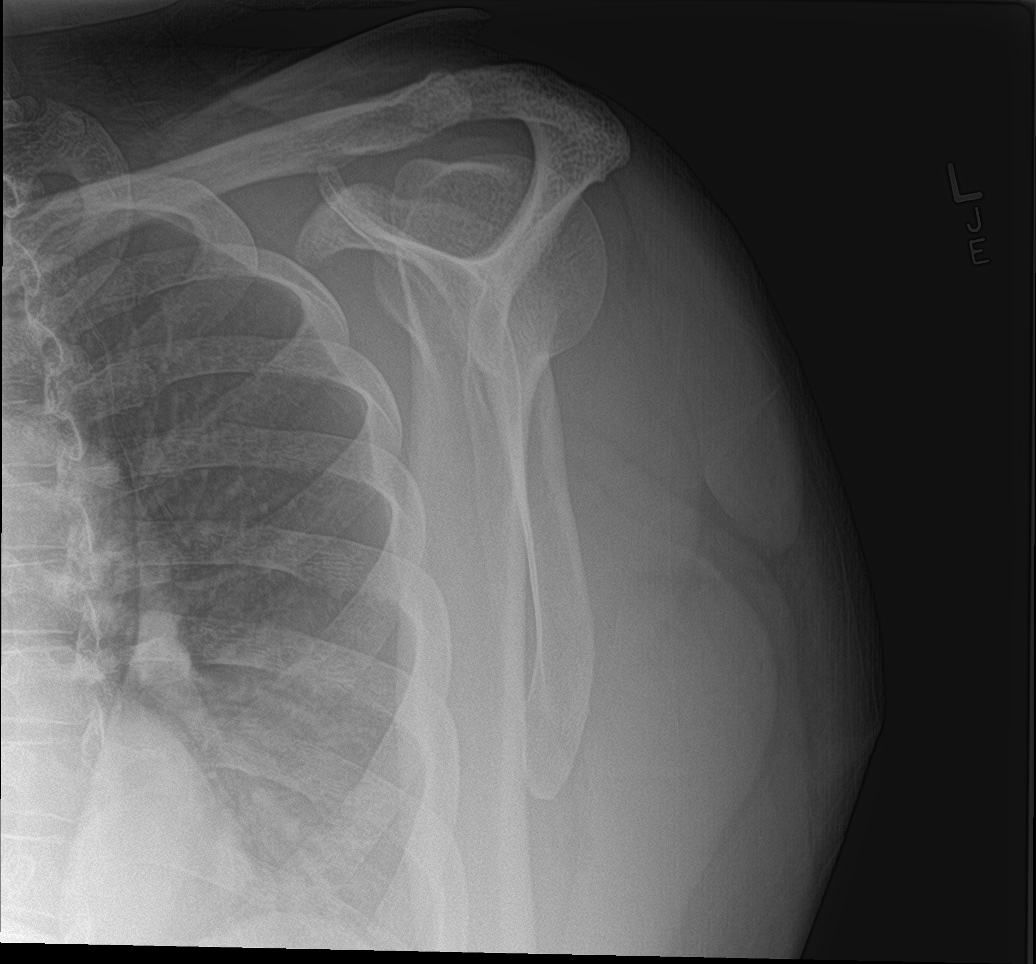

[shoulder axillary]
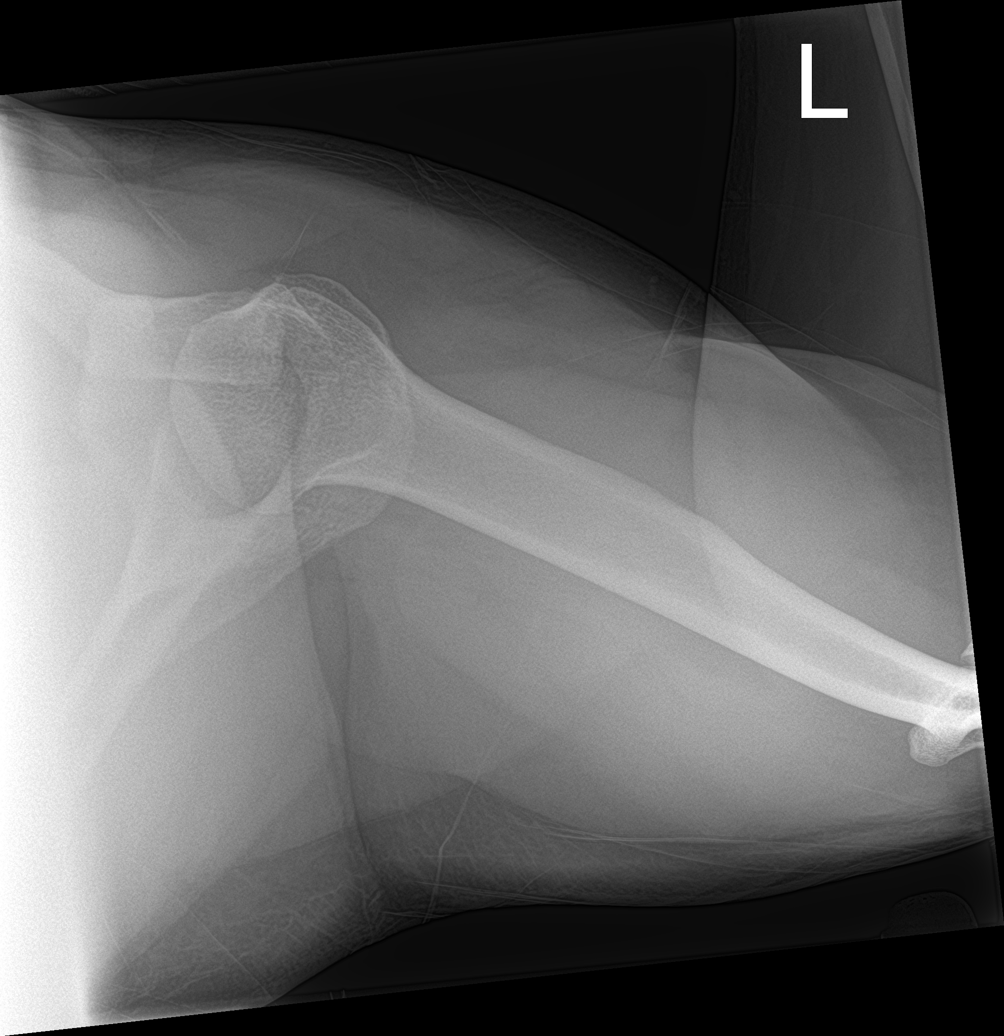

[shoulder ap neutral]
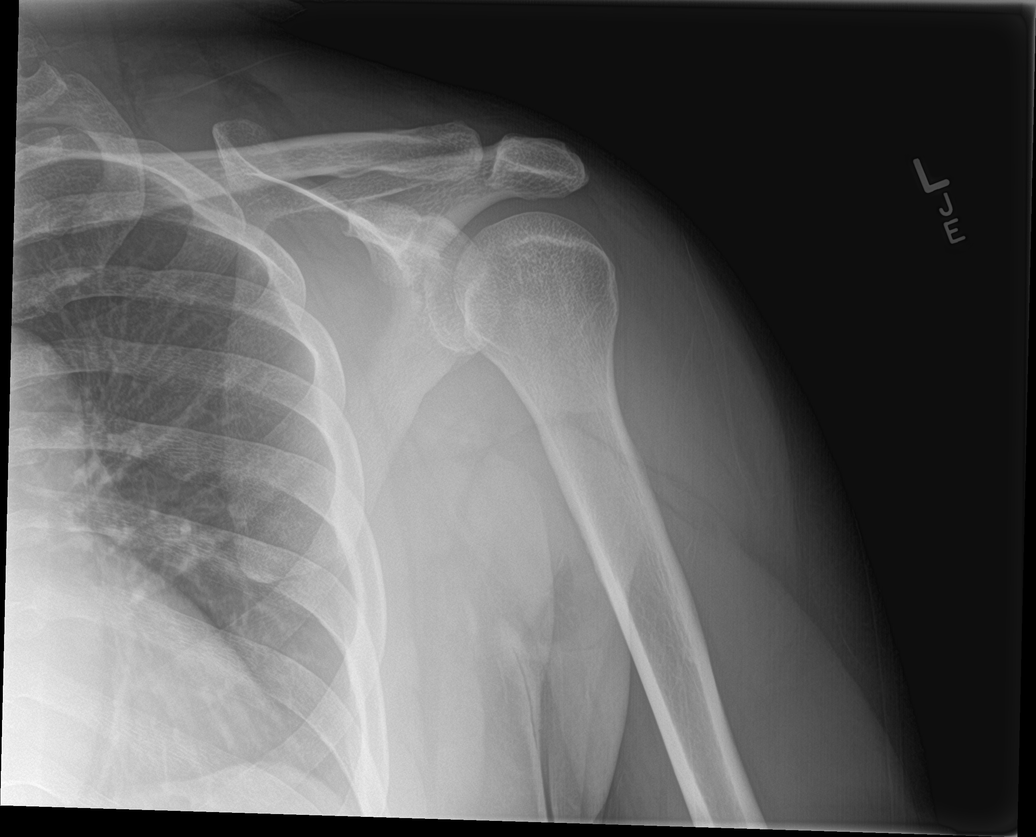

[4 of 4 positions shown; findings below may reference images not displayed]

FINDINGS: Frontal, transscapular, and axillary views of the left shoulder are
obtained. No fracture, subluxation, or dislocation. Joint spaces are
well preserved. Minimal spurring undersurface acromion process. Left
chest is clear.
IMPRESSION: 1. Mild spurring of the acromion process. Otherwise unremarkable
left shoulder.

## 2022-11-05 ENCOUNTER — Telehealth: Payer: Self-pay

## 2022-11-05 NOTE — Telephone Encounter (Signed)
At request of Dr Delford Field, call placed to patient :[phone] to discuss scheduling an ED follow up appointment, message left with call back requested.

## 2022-11-11 NOTE — Telephone Encounter (Signed)
Attempted to contact patient again to schedule an ED follow up appointment , message left with call back requested

## 2022-11-25 NOTE — Telephone Encounter (Signed)
    I tried again to contact the patient to schedule and ED follow up appointment. Message left with call back requested. I also sent him a letter requesting he contact the clinic to schedule the appointment as we have not been able to reach him

## 2022-11-30 ENCOUNTER — Telehealth: Payer: Self-pay | Admitting: *Deleted

## 2022-11-30 ENCOUNTER — Other Ambulatory Visit (HOSPITAL_COMMUNITY): Payer: Self-pay

## 2022-11-30 NOTE — Telephone Encounter (Signed)
Pt has appt 12/11/22 with Dr. Royann Shivers for pre op clearance. I will update all parties involved.

## 2022-11-30 NOTE — Telephone Encounter (Signed)
PRIMARY CARD DR. Royann Shivers. Left message to call back to schedule an IN OFFICE appt for pre op clearance.

## 2022-11-30 NOTE — Telephone Encounter (Signed)
   Pre-operative Risk Assessment    Patient Name: Derek Reed  DOB: 02/09/1986 MRN: [id number]      Request for Surgical Clearance    Procedure:   CHOLECYSTECTOMY   Date of Surgery:  Clearance TBD                                 Surgeon:  DR. Violeta Gelinas Surgeon's Group or Practice Name:  [facility] Phone number:  [phone] Fax number:  [phone] ATTN: Derek Reed, BSN, RN   Type of Clearance Requested:   - Medical ; NO MEDICATIONS ARE LISTED TO BE HELD   Type of Anesthesia:  General    Additional requests/questions:    Derek Reed   11/30/2022, 10:26 AM

## 2022-11-30 NOTE — Telephone Encounter (Signed)
   Name: Derek Reed  DOB: 1985-08-13  MRN: [id number]  Primary Cardiologist: None  Chart reviewed as part of pre-operative protocol coverage. Because of Derek Reed's past medical history and time since last visit, he will require a follow-up in-office visit in order to better assess preoperative cardiovascular risk.  Pre-op covering staff: - Please schedule appointment and call patient to inform them. If patient already had an upcoming appointment within acceptable timeframe, please add "pre-op clearance" to the appointment notes so provider is aware. - Please contact requesting surgeon's office via preferred method (i.e, phone, fax) to inform them of need for appointment prior to surgery.   Joni Reining, NP  11/30/2022, 11:49 AM

## 2022-12-02 ENCOUNTER — Other Ambulatory Visit (HOSPITAL_COMMUNITY): Payer: Self-pay

## 2022-12-05 ENCOUNTER — Other Ambulatory Visit: Payer: Self-pay

## 2022-12-05 ENCOUNTER — Emergency Department (HOSPITAL_BASED_OUTPATIENT_CLINIC_OR_DEPARTMENT_OTHER)
Admission: EM | Admit: 2022-12-05 | Discharge: 2022-12-05 | Disposition: A | Discharge status: Home / Self Care | Payer: Commercial Managed Care - PPO | Attending: Emergency Medicine | Admitting: Emergency Medicine

## 2022-12-05 ENCOUNTER — Encounter (HOSPITAL_BASED_OUTPATIENT_CLINIC_OR_DEPARTMENT_OTHER): Payer: Self-pay

## 2022-12-05 DIAGNOSIS — F410 Panic disorder [episodic paroxysmal anxiety] without agoraphobia: Secondary | ICD-10-CM | POA: Diagnosis not present

## 2022-12-05 DIAGNOSIS — Z87891 Personal history of nicotine dependence: Secondary | ICD-10-CM | POA: Diagnosis not present

## 2022-12-05 DIAGNOSIS — F419 Anxiety disorder, unspecified: Secondary | ICD-10-CM | POA: Diagnosis present

## 2022-12-05 DIAGNOSIS — I509 Heart failure, unspecified: Secondary | ICD-10-CM | POA: Insufficient documentation

## 2022-12-05 DIAGNOSIS — Z79899 Other long term (current) drug therapy: Secondary | ICD-10-CM | POA: Insufficient documentation

## 2022-12-05 DIAGNOSIS — I110 Hypertensive heart disease with heart failure: Secondary | ICD-10-CM | POA: Diagnosis not present

## 2022-12-05 MED ORDER — LORAZEPAM 1 MG PO TABS: 1.0000 mg | ORAL_TABLET | Freq: Three times a day (TID) | ORAL | 0 refills | Status: DC | PRN

## 2022-12-05 NOTE — ED Provider Notes (Signed)
[facility] Provider Note: Derek Dell, MD, FACEP  CSN: [id number] MRN: [id number] ARRIVAL: 12/05/22 at 0242 ROOM: MH01/MH01   CHIEF COMPLAINT  Anxiety   HISTORY OF PRESENT ILLNESS  12/05/22 2:51 AM Derek Reed is a 37 y.o. male who was lying in bed trying to go to sleep just prior to arrival when he started feeling jittery.  He does not mean physically jittery (shaking or tremulousness) but rather feeling anxious and his head.  He has a history of similar attacks in the past.  He was not anxious about anything in particular but the symptoms he was having reinforced themselves (making him feel more anxious).  He did not have chest pain, shortness of breath, hyperventilation, neurologic symptoms, diaphoresis or nausea.  He has been treated for anxiety with alprazolam in the past but not currently.  His symptoms lasted about 7 minutes and resolved by the time he got here.  He denies any suicidal or homicidal ideations.  He denies depressive symptoms.  He is not diabetic and has never had hypoglycemia.   Past Medical History:  Diagnosis Date   Acute CHF (congestive heart failure) (HCC) 02/24/2019   Anxiety    Hypertension    Obesity    Peritonsillar abscess     Past Surgical History:  Procedure Laterality Date   INCISION AND DRAINAGE OF PERITONSILLAR ABCESS     RIGHT/LEFT HEART CATH AND CORONARY ANGIOGRAPHY N/A 02/24/2019   Procedure: RIGHT/LEFT HEART CATH AND CORONARY ANGIOGRAPHY;  Surgeon: Laurey Morale, MD;  Location: [facility] INVASIVE CV LAB;  Service: Cardiovascular;  Laterality: N/A;    Family History  Problem Relation Age of Onset   Hyperlipidemia Mother    Diabetes Mother    Stroke Mother    Hypertension Mother    Hypertension Father     Social History   Tobacco Use   Smoking status: Former   Smokeless tobacco: Never  Advertising account planner   Vaping status: Never Used  Substance Use Topics   Alcohol use: No   Drug use: No    Prior to Admission medications    Medication Sig Start Date End Date Taking? Authorizing Provider  glipiZIDE (GLUCOTROL) 5 MG tablet Take 1 tablet (5 mg total) by mouth 2 (two) times daily before a meal. 06/25/22   McClung, Marzella Schlein, PA-C  LORazepam (ATIVAN) 1 MG tablet Take 1 tablet (1 mg total) by mouth 3 (three) times daily as needed for anxiety (may cause drowsiness). 12/05/22  Yes Anaid Haney, MD  atorvastatin (LIPITOR) 10 MG tablet Take 1 tablet (10 mg total) by mouth daily. 06/24/22   Anders Simmonds, PA-C  Blood Pressure Monitor MISC Use to check blood pressure daily 06/24/22   Anders Simmonds, PA-C  carvedilol (COREG) 12.5 MG tablet Take 1 tablet by mouth 2 times daily with a meal. 08/17/22   Storm Frisk, MD  chlorthalidone (HYGROTON) 25 MG tablet Take 1 tablet (25 mg total) by mouth daily. 06/24/22   Anders Simmonds, PA-C  chlorthalidone (HYGROTON) 25 MG tablet Take 1 tablet (25 mg total) by mouth daily. 10/24/22   Storm Frisk, MD  ibuprofen (ADVIL) 800 MG tablet Take 1 tablet (800 mg total) by mouth every 8 (eight) hours as needed for moderate pain. 11/03/22   Edwin Dada P, DO  isosorbide-hydrALAZINE (BIDIL) 20-37.5 MG tablet Take 2 tablets by mouth 2 (two) times daily. 06/24/22 06/24/23  Anders Simmonds, PA-C  nitroGLYCERIN (NITROSTAT) 0.4 MG SL tablet Place 1 tablet (  0.4 mg total) under the tongue every 5 (five) minutes as needed for chest pain. Patient not taking: Reported on 12/02/2021 10/27/21   Storm Frisk, MD  ondansetron (ZOFRAN) 4 MG tablet Take 1 tablet (4 mg total) by mouth every 6 (six) hours. 11/03/22   Edwin Dada P, DO  potassium chloride SA (KLOR-CON M) 20 MEQ tablet Take 1 tablet by mouth 2 times daily. 06/24/22   Anders Simmonds, PA-C  sacubitril-valsartan (ENTRESTO) 97-103 MG Take 1 tablet by mouth 2 times daily. 11/02/22   Storm Frisk, MD  spironolactone (ALDACTONE) 25 MG tablet Take 1 tablet (25 mg total) by mouth daily. 06/24/22 06/24/23  Anders Simmonds, PA-C     Allergies Egg-derived products   REVIEW OF SYSTEMS  Negative except as noted here or in the History of Present Illness.   PHYSICAL EXAMINATION  Initial Vital Signs Blood pressure 125/63, pulse 66, temperature 97.8 F (36.6 C), temperature source Oral, resp. rate 18, height 5\' 6"  (1.676 m), weight 112.5 kg, SpO2 99%.  Examination General: Well-developed, well-nourished male in no acute distress; appearance consistent with age of record HENT: normocephalic; atraumatic Eyes: Normal appearance Neck: supple Heart: regular rate and rhythm Lungs: clear to auscultation bilaterally Abdomen: soft; nondistended; nontender; bowel sounds present Extremities: No deformity; full range of motion; pulses normal Neurologic: Awake, alert and oriented; motor function intact in all extremities and symmetric; no facial droop Skin: Warm and dry Psychiatric: Mildly anxious   RESULTS  Summary of this visit's results, reviewed and interpreted by myself:   EKG Interpretation Date/Time:    Ventricular Rate:    PR Interval:    QRS Duration:    QT Interval:    QTC Calculation:   R Axis:      Text Interpretation:         Laboratory Studies: No results found for this or any previous visit (from the past 24 hour(s)). Imaging Studies: No results found.  ED COURSE and MDM  Nursing notes, initial and subsequent vitals signs, including pulse oximetry, reviewed and interpreted by myself.  Vitals:   12/05/22 0249 12/05/22 0250  BP: 125/63   Pulse: 66   Resp: 18   Temp: 97.8 F (36.6 C)   TempSrc: Oral   SpO2: 99%   Weight:  112.5 kg  Height:  5\' 6"  (1.676 m)   Medications - No data to display  The patient's symptoms sound like a mild panic attack.  He has had similar episodes in the past.  There were no concerning symptoms such as shortness of breath or chest pain with this.  His vital signs are normal on arrival.  He denies drug or alcohol use.  PROCEDURES  Procedures   ED  DIAGNOSES     ICD-10-CM   1. Panic attack  F41.0          Ronesha Heenan, Jonny Ruiz, MD 12/05/22 [phone]

## 2022-12-05 NOTE — ED Triage Notes (Signed)
Min ago he got very jittery and he thinks he had a panic attack. Hx of anxiety; not medicated. Pt endorses life stressors. No other complaints.

## 2022-12-09 ENCOUNTER — Other Ambulatory Visit: Payer: Self-pay | Admitting: Critical Care Medicine

## 2022-12-09 ENCOUNTER — Other Ambulatory Visit (HOSPITAL_COMMUNITY): Payer: Self-pay

## 2022-12-09 DIAGNOSIS — I5022 Chronic systolic (congestive) heart failure: Secondary | ICD-10-CM

## 2022-12-09 MED ORDER — CARVEDILOL 12.5 MG PO TABS
12.5000 mg | ORAL_TABLET | Freq: Two times a day (BID) | ORAL | 0 refills | Status: DC
Start: 2022-12-09 — End: 2022-12-11
  Filled 2022-12-09: qty 60, 30d supply, fill #0

## 2022-12-10 ENCOUNTER — Other Ambulatory Visit (HOSPITAL_COMMUNITY): Payer: Self-pay

## 2022-12-10 ENCOUNTER — Other Ambulatory Visit: Payer: Self-pay | Admitting: Critical Care Medicine

## 2022-12-10 DIAGNOSIS — I10 Essential (primary) hypertension: Secondary | ICD-10-CM

## 2022-12-10 NOTE — Telephone Encounter (Signed)
Requested medications are due for refill today.  yes  Requested medications are on the active medications list.  yes  Last refill. 06/24/2022 #60 3 rf  Future visit scheduled.   no  Notes to clinic.  Refill not delegated.     Requested Prescriptions  Pending Prescriptions Disp Refills   potassium chloride SA (KLOR-CON M) 20 MEQ tablet 60 tablet 3    Sig: Take 1 tablet by mouth 2 times daily.     Endocrinology:  Minerals - Potassium Supplementation Failed - 12/10/2022  8:17 AM      Failed - Cr in normal range and within 360 days    Creat  Date Value Ref Range Status  03/26/2014 1.21 0.50 - 1.35 mg/dL Final   Creatinine, Ser  Date Value Ref Range Status  11/03/2022 1.81 (H) 0.61 - 1.24 mg/dL Final         Passed - K in normal range and within 360 days    Potassium  Date Value Ref Range Status  11/03/2022 3.6 3.5 - 5.1 mmol/L Final         Passed - Valid encounter within last 12 months    Recent Outpatient Visits           4 months ago Primary hypertension   [place] [facility] [place], Cornelius Moras, RPH-CPP   5 months ago High blood sugar   [place] [facility] [place], [place], [place]   1 year ago Chronic systolic heart failure (HCC)due to Hypertension   [facility] Storm Frisk, MD   1 year ago Primary hypertension   [place] [facility] Storm Frisk, MD   1 year ago Chronic systolic heart failure (HCC)due to Hypertension   [facility] [place] Storm Frisk, MD       Future Appointments             Tomorrow Jodelle Gross, NP [facility]

## 2022-12-10 NOTE — Progress Notes (Signed)
Cardiology Clinic Note   Patient Name: Derek Reed Date of Encounter: 12/11/2022  Primary Care Provider:  Storm Frisk, MD Primary Cardiologist:  Derek Fair, MD  Patient Profile    37 year old male with history of chronic heart failure, prior history of severely depressed LVEF of 25% to 30% in the setting of nonischemic cardiomyopathy due to untreated severe hypertension.  Patient did have a cardiac catheterization which revealed normal coronary arteries and a coronary calcium score of 0 by CT.  He did have improvement of his LVEF in October 2022 back to 55 to 60%.  Other history includes palpitations, chronic kidney disease stage II, and prediabetes.  Last seen by Derek Reed on 12/02/2021.  Past Medical History    Past Medical History:  Diagnosis Date   Acute CHF (congestive heart failure) (HCC) 02/24/2019   Anxiety    Hypertension    Obesity    Peritonsillar abscess    Past Surgical History:  Procedure Laterality Date   INCISION AND DRAINAGE OF PERITONSILLAR ABCESS     RIGHT/LEFT HEART CATH AND CORONARY ANGIOGRAPHY N/A 02/24/2019   Procedure: RIGHT/LEFT HEART CATH AND CORONARY ANGIOGRAPHY;  Surgeon: Derek Morale, MD;  Location: [facility] INVASIVE CV LAB;  Service: Cardiovascular;  Laterality: N/A;    Allergies  Allergies  Allergen Reactions   Egg-Derived Products Nausea And Vomiting    History of Present Illness    Derek Reed comes for ongoing assessment of HTN, diastolic CHF, obesity. He comes today for pre-operative evaluation to have cholecystectomy on date to be determined, with Dr. Violeta Reed.  He was seen in the ED on 12/05/2022 for panic attack.  He states that he is in a high-pressure work environment and that sometimes can cause him to have his stress get out of control.  He denies any chest pain, rapid heart rhythm, dyspnea on exertion, he is able to climb up and down stairs at his workplace several times a day, he denies any significant shortness of  breath dizziness fluid retention.  He is medically compliant.  He is avoiding salt.  He has not had any significant weight gain or fluid retention.  Home Medications    Current Outpatient Medications  Medication Sig Dispense Refill   atorvastatin (LIPITOR) 10 MG tablet Take 1 tablet (10 mg total) by mouth daily. 90 tablet 3   Blood Pressure Monitor MISC Use to check blood pressure daily 1 each 0   carvedilol (COREG) 12.5 MG tablet Take 1 tablet by mouth 2 times daily with a meal. 60 tablet 6   chlorthalidone (HYGROTON) 25 MG tablet Take 1 tablet (25 mg total) by mouth daily. 30 tablet 6   isosorbide-hydrALAZINE (BIDIL) 20-37.5 MG tablet Take 2 tablets by mouth 2 (two) times daily. 120 tablet 3   LORazepam (ATIVAN) 1 MG tablet Take 1 tablet (1 mg total) by mouth 3 (three) times daily as needed for anxiety (may cause drowsiness). (Patient not taking: Reported on 12/11/2022) 15 tablet 0   nitroGLYCERIN (NITROSTAT) 0.4 MG SL tablet Place 1 tablet (0.4 mg total) under the tongue every 5 (five) minutes as needed for chest pain. 25 tablet 3   potassium chloride SA (KLOR-CON M) 20 MEQ tablet Take 1 tablet by mouth 2 times daily. 60 tablet 3   sacubitril-valsartan (ENTRESTO) 97-103 MG Take 1 tablet by mouth 2 times daily. 60 tablet 5   spironolactone (ALDACTONE) 25 MG tablet Take 1 tablet (25 mg total) by mouth daily. 90 tablet 1   No current  facility-administered medications for this visit.     Family History    Family History  Problem Relation Age of Onset   Hyperlipidemia Mother    Diabetes Mother    Stroke Mother    Hypertension Mother    Hypertension Father    He indicated that the status of his mother is unknown. He indicated that the status of his father is unknown.  Social History    Social History   Socioeconomic History   Marital status: Married    Spouse name: Not on file   Number of children: Not on file   Years of education: Not on file   Highest education level: Not on  file  Occupational History   Not on file  Tobacco Use   Smoking status: Former   Smokeless tobacco: Never  Vaping Use   Vaping status: Never Used  Substance and Sexual Activity   Alcohol use: No   Drug use: No   Sexual activity: Not on file  Other Topics Concern   Not on file  Social History Narrative   Not on file   Social Determinants of Health   Financial Resource Strain: Low Risk  (07/31/2022)   Overall Financial Resource Strain (CARDIA)    Difficulty of Paying Living Expenses: Not very hard  Food Insecurity: No Food Insecurity (07/31/2022)   Hunger Vital Sign    Worried About Running Out of Food in the Last Year: Never true    Ran Out of Food in the Last Year: Never true  Transportation Needs: No Transportation Needs (07/31/2022)   PRAPARE - Administrator, Civil Service (Medical): No    Lack of Transportation (Non-Medical): No  Physical Activity: Insufficiently Active (07/31/2022)   Exercise Vital Sign    Days of Exercise per Week: 2 days    Minutes of Exercise per Session: 60 min  Stress: Stress Concern Present (07/31/2022)   Derek Reed of Occupational Health - Occupational Stress Questionnaire    Feeling of Stress : To some extent  Social Connections: Socially Integrated (07/31/2022)   Social Connection and Isolation Panel [NHANES]    Frequency of Communication with Friends and Family: More than three times a week    Frequency of Social Gatherings with Friends and Family: More than three times a week    Attends Religious Services: More than 4 times per year    Active Member of [employer] or Organizations: Yes    Attends Banker Meetings: More than 4 times per year    Marital Status: Married  Catering manager Violence: Unknown (07/31/2022)   Humiliation, Afraid, Rape, and Kick questionnaire    Fear of Current or Ex-Partner: No    Emotionally Abused: Not on file    Physically Abused: No    Sexually Abused: No     Review of Systems     General:  No chills, fever, night sweats or weight changes.  Cardiovascular:  No chest pain, dyspnea on exertion, edema, orthopnea, palpitations, paroxysmal nocturnal dyspnea. Dermatological: No rash, lesions/masses Respiratory: No cough, dyspnea Urologic: No hematuria, dysuria Abdominal:   No nausea, vomiting, diarrhea, bright red blood per rectum, melena, or hematemesis Neurologic:  No visual changes, wkns, changes in mental status. All other systems reviewed and are otherwise negative except as noted above.    Physical Exam    VS:  BP 114/86 (BP Location: Left Arm, Patient Position: Sitting, Cuff Size: Normal)   Pulse (!) 57   Ht 5\' 6"  (1.676 m)  Wt 251 lb 6.4 oz (114 kg)   SpO2 98%   BMI 40.58 kg/m  , BMI Body mass index is 40.58 kg/m.     GEN: Well nourished, well developed, in no acute distress. HEENT: normal. Neck: Supple, no JVD, carotid bruits, or masses. Cardiac: RRR, no murmurs, rubs, or gallops. No clubbing, cyanosis, edema.  Radials/DP/PT 2+ and equal bilaterally.  Respiratory:  Respirations regular and unlabored, clear to auscultation bilaterally. GI: Soft, nontender, nondistended, BS + x 4. MS: no deformity or atrophy. Skin: warm and dry, no rash. Neuro:  Strength and sensation are intact. Psych: Normal affect.  EKG Interpretation Date/Time:  Friday December 11 2022 08:44:17 EDT Ventricular Rate:  57 PR Interval:  202 QRS Duration:  92 QT Interval:  416 QTC Calculation: 404 R Axis:   -17  Text Interpretation: Sinus bradycardia When compared with ECG of 14-Jul-2022 08:25, PREVIOUS ECG IS PRESENT Confirmed by Joni Reining [phone]) on 12/11/2022 9:48:46 AM   Lab Results  Component Value Date   WBC 11.6 (H) 11/03/2022   HGB 11.9 (L) 11/03/2022   HCT 37.7 (L) 11/03/2022   MCV 73.2 (L) 11/03/2022   PLT 270 11/03/2022   Lab Results  Component Value Date   CREATININE 1.81 (H) 11/03/2022   BUN 21 (H) 11/03/2022   NA 135 11/03/2022   K 3.6 11/03/2022    CL 102 11/03/2022   CO2 26 11/03/2022   Lab Results  Component Value Date   ALT 20 11/03/2022   AST 18 11/03/2022   ALKPHOS 78 11/03/2022   BILITOT 0.5 11/03/2022   Lab Results  Component Value Date   CHOL 162 11/18/2021   HDL 33 (L) 11/18/2021   LDLCALC 103 (H) 11/18/2021   TRIG 143 11/18/2021   CHOLHDL 4.9 11/18/2021    Lab Results  Component Value Date   HGBA1C 6.8 (H) 06/24/2022     Review of Prior Studies EKG Interpretation Date/Time:  Friday December 11 2022 08:44:17 EDT Ventricular Rate:  57 PR Interval:  202 QRS Duration:  92 QT Interval:  416 QTC Calculation: 404 R Axis:   -17  Text Interpretation: Sinus bradycardia When compared with ECG of 14-Jul-2022 08:25, PREVIOUS ECG IS PRESENT Confirmed by Joni Reining [phone]) on 12/11/2022 9:48:46 AM  Echocardiogram 03/06/2021 1. Left ventricular ejection fraction, by estimation, is 55 to 60%. The  left ventricle has normal function. The left ventricle has no regional  wall motion abnormalities. Left ventricular diastolic parameters were  normal.   2. Right ventricular systolic function is normal. The right ventricular  size is normal. Tricuspid regurgitation signal is inadequate for assessing  PA pressure.   3. The mitral valve is normal in structure. No evidence of mitral valve  regurgitation. No evidence of mitral stenosis.   4. The aortic valve is tricuspid. Aortic valve regurgitation is not  visualized. No aortic stenosis is present.   5. The inferior vena cava is normal in size with greater than 50%  respiratory variability, suggesting right atrial pressure of 3 mmHg.   Zio Monitor 12/29/2021    The dominant rhythm was normal sinus with normal circadian variation.   There are very rare PVCs and very rare PACs.   No evidence of complex atrial or ventricular arrhythmia.  No significant bradycardia.   Patient triggered recordings did not show any significant arrhythmia.  Some of them show sinus tachycardia  and motion artifact suggesting that they were collected during physical activity, but are otherwise normal.   Normal event  monitor.  No significant arrhythmia detected.  No arrhythmia during symptom triggered recordings.   Assessment & Plan   1.  Pre-Op Clearance: Chart reviewed as part of pre-operative protocol coverage. Given past medical history and time since last visit, based on ACC/AHA guidelines, Maude Kristiansen is at acceptable risk for the planned procedure without further cardiovascular testing.  RCRI 0.9 Low Risk   The patient was advised that if he develops new symptoms prior to surgery to contact our office to arrange for a follow-up visit, and he verbalized understanding  2.  HFpEF: History of nonischemic cardiomyopathy related to hypertension with diastolic CHF.  The patient denies any complaints of chest pain shortness of breath or volume overload.  He is avoiding salty foods.  He is physically active and medically compliant.  Will continue his current GDMT medications with refills.  Will need to have follow-up labs on next appointment he has had recent labs in June 2024.  Creatinine 1.8.  3.  Hypertension: Excellent control of blood pressure.  No changes in his medication regimen.  Continue salt avoidance and purposeful exercise.  4  Anxiety: Recent trip to ED due to panic attack.  He is being followed by PCP for this and has been given prescription for antianxiety medications which she has not filled.  Management per PCP.          Signed, Bettey Mare. Liborio Nixon, ANP, AACC   12/11/2022 9:49 AM      Office [phone] Fax [phone]  Notice: This dictation was prepared with Dragon dictation along with smaller phrase technology. Any transcriptional errors that result from this process are unintentional and may not be corrected upon review.

## 2022-12-11 ENCOUNTER — Ambulatory Visit: Payer: Commercial Managed Care - PPO | Attending: Adult Health | Admitting: Adult Health

## 2022-12-11 ENCOUNTER — Encounter: Payer: Self-pay | Admitting: Adult Health

## 2022-12-11 ENCOUNTER — Other Ambulatory Visit (HOSPITAL_COMMUNITY): Payer: Self-pay

## 2022-12-11 VITALS — BP 114/86 | HR 57 | Ht 66.0 in | Wt 251.4 lb

## 2022-12-11 DIAGNOSIS — Z01810 Encounter for preprocedural cardiovascular examination: Secondary | ICD-10-CM | POA: Diagnosis not present

## 2022-12-11 DIAGNOSIS — I10 Essential (primary) hypertension: Secondary | ICD-10-CM

## 2022-12-11 DIAGNOSIS — R002 Palpitations: Secondary | ICD-10-CM | POA: Diagnosis not present

## 2022-12-11 DIAGNOSIS — I5022 Chronic systolic (congestive) heart failure: Secondary | ICD-10-CM

## 2022-12-11 MED ORDER — SPIRONOLACTONE 25 MG PO TABS
25.0000 mg | ORAL_TABLET | Freq: Every day | ORAL | 1 refills | Status: DC
Start: 2022-12-11 — End: 2023-01-26
  Filled 2022-12-11 – 2023-01-11 (×2): qty 90, 90d supply, fill #0
  Filled 2023-01-14: qty 30, 30d supply, fill #0

## 2022-12-11 MED ORDER — CARVEDILOL 12.5 MG PO TABS
12.5000 mg | ORAL_TABLET | Freq: Two times a day (BID) | ORAL | 6 refills | Status: DC
Start: 2022-12-11 — End: 2023-01-26
  Filled 2022-12-11: qty 60, 30d supply, fill #0
  Filled 2023-01-11: qty 60, 30d supply, fill #1

## 2022-12-11 MED ORDER — CHLORTHALIDONE 25 MG PO TABS
25.0000 mg | ORAL_TABLET | Freq: Every day | ORAL | 6 refills | Status: DC
Start: 2022-12-11 — End: 2023-01-26
  Filled 2022-12-11 – 2023-01-11 (×2): qty 30, 30d supply, fill #0

## 2022-12-11 MED ORDER — ENTRESTO 97-103 MG PO TABS
1.0000 | ORAL_TABLET | Freq: Two times a day (BID) | ORAL | 5 refills | Status: DC
Start: 2022-12-11 — End: 2023-01-26
  Filled 2022-12-11 (×2): qty 60, 30d supply, fill #0
  Filled 2023-01-11: qty 60, 30d supply, fill #1

## 2022-12-11 MED ORDER — BLOOD PRESSURE MONITOR MISC
1.0000 | Freq: Every day | 0 refills | Status: DC
Start: 2022-12-11 — End: 2023-03-10

## 2022-12-11 MED ORDER — ISOSORB DINITRATE-HYDRALAZINE 20-37.5 MG PO TABS
2.0000 | ORAL_TABLET | Freq: Two times a day (BID) | ORAL | 3 refills | Status: DC
Start: 2022-12-11 — End: 2023-01-26
  Filled 2022-12-11: qty 120, 30d supply, fill #0

## 2022-12-11 MED ORDER — NITROGLYCERIN 0.4 MG SL SUBL
0.4000 mg | SUBLINGUAL_TABLET | SUBLINGUAL | 3 refills | Status: DC | PRN
  Filled 2022-12-11 (×2): qty 25, 1d supply, fill #0

## 2022-12-11 MED ORDER — POTASSIUM CHLORIDE CRYS ER 20 MEQ PO TBCR
20.0000 meq | EXTENDED_RELEASE_TABLET | Freq: Two times a day (BID) | ORAL | 3 refills | Status: DC
Start: 2022-12-11 — End: 2023-01-26
  Filled 2022-12-11 (×2): qty 60, 30d supply, fill #0
  Filled 2023-01-11: qty 60, 30d supply, fill #1

## 2022-12-11 MED ORDER — BLOOD PRESSURE MONITOR MISC
1.0000 | Freq: Every day | 0 refills | Status: DC
Start: 2022-12-11 — End: 2022-12-11
  Filled 2022-12-11: qty 1, fill #0

## 2022-12-11 NOTE — Patient Instructions (Signed)
Medication Instructions:  No Changes *If you need a refill on your cardiac medications before your next appointment, please call your pharmacy*   Lab Work: No Labs If you have labs (blood work) drawn today and your tests are completely normal, you will receive your results only by: MyChart Message (if you have MyChart) OR A paper copy in the mail If you have any lab test that is abnormal or we need to change your treatment, we will call you to review the results.   Testing/Procedures: No Testing   Follow-Up: At [facility], you and your health needs are our priority.  As part of our continuing mission to provide you with exceptional heart care, we have created designated Provider Care Teams.  These Care Teams include your primary Cardiologist (physician) and Advanced Practice Providers (APPs -  Physician Assistants and Nurse Practitioners) who all work together to provide you with the care you need, when you need it.  We recommend signing up for the patient portal called "MyChart".  Sign up information is provided on this After Visit Summary.  MyChart is used to connect with patients for Virtual Visits (Telemedicine).  Patients are able to view lab/test results, encounter notes, upcoming appointments, etc.  Non-urgent messages can be sent to your provider as well.   To learn more about what you can do with MyChart, go to [email or web address].    Your next appointment:   6 month(s)  Provider:   Mihai Croitoru, MD   

## 2022-12-14 NOTE — Telephone Encounter (Signed)
There are several "honey pack "enhancement products available, manufactured Abrol did, that have an FDA warning on them because they contain tadalafil, which is the active ingredient in Cialis. He should not take any of these.  There is a dangerous, potentially life-threatening interaction between medications like Cialis and his heart failure medications (specifically the BiDil).  Combining medications like Cialis, Viagra, Levitra with the active ingredient isosorbide dinitrate and BiDil can cause life-threatening, potentially fatal hypotension.  He should not take the supplements

## 2023-01-06 ENCOUNTER — Ambulatory Visit: Payer: Self-pay | Admitting: General Surgery

## 2023-01-06 NOTE — Pre-Procedure Instructions (Signed)
Surgical Instructions    Your procedure is scheduled on Friday, September 6th .  Report to [facility] Main Entrance "A" at 5:30 A.M., then check in with the Admitting office.  Call this number if you have problems the morning of surgery:  [phone]  If you have any questions prior to your surgery date call [phone]: Open Monday-Friday 8am-4pm If you experience any cold or flu symptoms such as cough, fever, chills, shortness of breath, etc. between now and your scheduled surgery, please notify us at the above number.     Remember:  Do not eat after midnight the night before your surgery  You may drink clear liquids until 4:30 a.m. the morning of your surgery.   Clear liquids allowed are: Water, Non-Citrus Juices (without pulp), Carbonated Beverages, Clear Tea, Black Coffee Only (NO MILK, CREAM OR POWDERED CREAMER of any kind), and Gatorade.    Take these medicines the morning of surgery with A SIP OF WATER:  atorvastatin (LIPITOR)  carvedilol (COREG)  isosorbide-hydrALAZINE (BIDIL)   As of today, STOP taking any Aspirin (unless otherwise instructed by your surgeon) Aleve, Naproxen, Ibuprofen, Motrin, Advil, Goody's, BC's, all herbal medications, fish oil, and all vitamins.                     Do NOT Smoke (Tobacco/Vaping) for 24 hours prior to your procedure.  If you use a CPAP at night, you may bring your mask/headgear for your overnight stay.   Contacts, glasses, piercing's, hearing aid's, dentures or partials may not be worn into surgery, please bring cases for these belongings.    For patients admitted to the hospital, discharge time will be determined by your treatment team.   Patients discharged the day of surgery will not be allowed to drive home, and someone needs to stay with them for 24 hours.  SURGICAL WAITING ROOM VISITATION Patients having surgery or a procedure may have no more than 2 support people in the waiting area - these visitors may rotate.   Children  under the age of 59 must have an adult with them who is not the patient. If the patient needs to stay at the hospital during part of their recovery, the visitor guidelines for inpatient rooms apply. Pre-op nurse will coordinate an appropriate time for 1 support person to accompany patient in pre-op.  This support person may not rotate.   Please refer to the [facility] website for the visitor guidelines for Inpatients (after your surgery is over and you are in a regular room).    Special instructions:   [place]- Preparing For Surgery  Before surgery, you can play an important role. Because skin is not sterile, your skin needs to be as free of germs as possible. You can reduce the number of germs on your skin by washing with CHG (chlorahexidine gluconate) Soap before surgery.  CHG is an antiseptic cleaner which kills germs and bonds with the skin to continue killing germs even after washing.    Oral Hygiene is also important to reduce your risk of infection.  Remember - BRUSH YOUR TEETH THE MORNING OF SURGERY WITH YOUR REGULAR TOOTHPASTE  Please do not use if you have an allergy to CHG or antibacterial soaps. If your skin becomes reddened/irritated stop using the CHG.  Do not shave (including legs and underarms) for at least 48 hours prior to first CHG shower. It is OK to shave your face.  Please follow these instructions carefully.   Shower the  NIGHT BEFORE SURGERY and the MORNING OF SURGERY  If you chose to wash your hair, wash your hair first as usual with your normal shampoo.  After you shampoo, rinse your hair and body thoroughly to remove the shampoo.  Use CHG Soap as you would any other liquid soap. You can apply CHG directly to the skin and wash gently with a scrungie or a clean washcloth.   Apply the CHG Soap to your body ONLY FROM THE NECK DOWN.  Do not use on open wounds or open sores. Avoid contact with your eyes, ears, mouth and genitals (private parts). Wash Face and  genitals (private parts)  with your normal soap.   Wash thoroughly, paying special attention to the area where your surgery will be performed.  Thoroughly rinse your body with warm water from the neck down.  DO NOT shower/wash with your normal soap after using and rinsing off the CHG Soap.  Pat yourself dry with a CLEAN TOWEL.  Wear CLEAN PAJAMAS to bed the night before surgery  Place CLEAN SHEETS on your bed the night before your surgery  DO NOT SLEEP WITH PETS.   Day of Surgery: Take a shower with CHG soap. Do not wear jewelry. Do not wear lotions, powders, colognes, or deodorant. Men may shave face and neck. Do not bring valuables to the hospital.  [facility] is not responsible for any belongings or valuables. Wear Clean/Comfortable clothing the morning of surgery Remember to brush your teeth WITH YOUR REGULAR TOOTHPASTE.   Please read over the following fact sheets that you were given.    If you received a COVID test during your pre-op visit  it is requested that you wear a mask when out in public, stay away from anyone that may not be feeling well and notify your surgeon if you develop symptoms. If you have been in contact with anyone that has tested positive in the last 10 days please notify you surgeon.

## 2023-01-07 ENCOUNTER — Encounter (HOSPITAL_COMMUNITY): Payer: Self-pay

## 2023-01-07 ENCOUNTER — Other Ambulatory Visit: Payer: Self-pay

## 2023-01-07 ENCOUNTER — Encounter (HOSPITAL_COMMUNITY)
Admission: RE | Admit: 2023-01-07 | Discharge: 2023-01-07 | Disposition: A | Discharge status: Home / Self Care | Payer: Commercial Managed Care - PPO | Source: Ambulatory Visit | Attending: General Surgery | Admitting: General Surgery

## 2023-01-07 VITALS — BP 104/68 | HR 66 | Temp 98.0°F | Resp 18 | Ht 66.0 in | Wt 253.4 lb

## 2023-01-07 DIAGNOSIS — I428 Other cardiomyopathies: Secondary | ICD-10-CM | POA: Diagnosis not present

## 2023-01-07 DIAGNOSIS — I509 Heart failure, unspecified: Secondary | ICD-10-CM | POA: Insufficient documentation

## 2023-01-07 DIAGNOSIS — Z01812 Encounter for preprocedural laboratory examination: Secondary | ICD-10-CM | POA: Diagnosis not present

## 2023-01-07 DIAGNOSIS — I130 Hypertensive heart and chronic kidney disease with heart failure and stage 1 through stage 4 chronic kidney disease, or unspecified chronic kidney disease: Secondary | ICD-10-CM | POA: Diagnosis not present

## 2023-01-07 DIAGNOSIS — I2510 Atherosclerotic heart disease of native coronary artery without angina pectoris: Secondary | ICD-10-CM | POA: Diagnosis not present

## 2023-01-07 DIAGNOSIS — E1122 Type 2 diabetes mellitus with diabetic chronic kidney disease: Secondary | ICD-10-CM | POA: Insufficient documentation

## 2023-01-07 DIAGNOSIS — N183 Chronic kidney disease, stage 3 unspecified: Secondary | ICD-10-CM | POA: Diagnosis not present

## 2023-01-07 DIAGNOSIS — Z01818 Encounter for other preprocedural examination: Secondary | ICD-10-CM

## 2023-01-07 DIAGNOSIS — E119 Type 2 diabetes mellitus without complications: Secondary | ICD-10-CM

## 2023-01-07 HISTORY — DX: Type 2 diabetes mellitus without complications: E11.9

## 2023-01-07 LAB — CBC
HCT: 38.3 % — ABNORMAL LOW (ref 39.0–52.0)
Hemoglobin: 12.2 g/dL — ABNORMAL LOW (ref 13.0–17.0)
MCH: 23.4 pg — ABNORMAL LOW (ref 26.0–34.0)
MCHC: 31.9 g/dL (ref 30.0–36.0)
MCV: 73.4 fL — ABNORMAL LOW (ref 80.0–100.0)
Platelets: 258 10*3/uL (ref 150–400)
RBC: 5.22 MIL/uL (ref 4.22–5.81)
RDW: 15.8 % — ABNORMAL HIGH (ref 11.5–15.5)
WBC: 9 10*3/uL (ref 4.0–10.5)
nRBC: 0 % (ref 0.0–0.2)

## 2023-01-07 LAB — BASIC METABOLIC PANEL
Anion gap: 10 (ref 5–15)
BUN: 21 mg/dL — ABNORMAL HIGH (ref 6–20)
CO2: 23 mmol/L (ref 22–32)
Calcium: 8.6 mg/dL — ABNORMAL LOW (ref 8.9–10.3)
Chloride: 101 mmol/L (ref 98–111)
Creatinine, Ser: 1.94 mg/dL — ABNORMAL HIGH (ref 0.61–1.24)
GFR, Estimated: 44 mL/min — ABNORMAL LOW (ref >60)
Glucose, Bld: 115 mg/dL — ABNORMAL HIGH (ref 70–99)
Potassium: 3.4 mmol/L — ABNORMAL LOW (ref 3.5–5.1)
Sodium: 134 mmol/L — ABNORMAL LOW (ref 135–145)

## 2023-01-07 LAB — HEMOGLOBIN A1C
Hgb A1c MFr Bld: 6.9 % — ABNORMAL HIGH (ref 4.8–5.6)
Mean Plasma Glucose: 151.33 mg/dL

## 2023-01-07 NOTE — Progress Notes (Signed)
PCP - Dr. Shan Levans Cardiologist - Dr. Joni Reining, Dr. Judie Petit. Croitoru  PPM/ICD - n/a  Chest x-ray - n/a EKG - 12/11/22 Stress Test - denies ECHO - 03/06/21 Cardiac Cath - 02/24/19  Sleep Study - denies. Stop bang +, routed to PCP. CPAP - denies  Fasting Blood Sugar - 88-120 Checks Blood Sugar 3-5 times a week. Last A1C-06/2022, 6.8. Will obtain A1C today. Pt stated that he was told he was pre-diabetic but did have 1 A1C in the diabetes range. Pt does check CBG at home per MD requests but PCP did not want him to take any medications at this time.   Blood Thinner Instructions: n/a Aspirin Instructions: n/a  ERAS Protcol -Clear liquids until 0430 DOS PRE-SURGERY Ensure or G2- none ordered.   COVID TEST- n/a  Anesthesia review: Yes, cardiac clearance received.  Patient denies shortness of breath, fever, cough and chest pain at PAT appointment   All instructions explained to the patient, with a verbal understanding of the material. Patient agrees to go over the instructions while at home for a better understanding. Patient also instructed to self quarantine after being tested for COVID-19. The opportunity to ask questions was provided.

## 2023-01-07 NOTE — Progress Notes (Signed)
   01/07/23 0817  OBSTRUCTIVE SLEEP APNEA  Have you ever been diagnosed with sleep apnea through a sleep study? No  Do you snore loudly (loud enough to be heard through closed doors)?  1  Do you often feel tired, fatigued, or sleepy during the daytime (such as falling asleep during driving or talking to someone)? 0  Has anyone observed you stop breathing during your sleep? 0  Do you have, or are you being treated for high blood pressure? 1  BMI more than 35 kg/m2? 1  Age > 50 (1-yes) 0  Neck circumference greater than:Male 16 inches or larger, Male 17inches or larger? 1  Male Gender (Yes=1) 1  Obstructive Sleep Apnea Score 5  Score 5 or greater  Results sent to PCP

## 2023-01-08 NOTE — Anesthesia Preprocedure Evaluation (Addendum)
Anesthesia Evaluation  Patient identified by MRN, date of birth, ID band Patient awake    Reviewed: Allergy & Precautions, NPO status , Patient's Chart, lab work & pertinent test results  Airway Mallampati: II  TM Distance: >3 FB Neck ROM: Full    Dental  (+) Dental Advisory Given, Missing   Pulmonary former smoker   Pulmonary exam normal breath sounds clear to auscultation       Cardiovascular hypertension, Pt. on home beta blockers and Pt. on medications (-) angina +CHF  (-) Past MI Normal cardiovascular exam Rhythm:Regular Rate:Normal  Echocardiogram 03/06/2021 1. Left ventricular ejection fraction, by estimation, is 55 to 60%. The  left ventricle has normal function. The left ventricle has no regional  wall motion abnormalities. Left ventricular diastolic parameters were  normal.   2. Right ventricular systolic function is normal. The right ventricular  size is normal. Tricuspid regurgitation signal is inadequate for assessing  PA pressure.   3. The mitral valve is normal in structure. No evidence of mitral valve  regurgitation. No evidence of mitral stenosis.   4. The aortic valve is tricuspid. Aortic valve regurgitation is not  visualized. No aortic stenosis is present.   5. The inferior vena cava is normal in size with greater than 50%  respiratory variability, suggesting right atrial pressure of 3 mmHg.    *prior history of severely depressed LVEF of 25% to 30% in the setting of nonischemic cardiomyopathy due to untreated severe hypertension    Neuro/Psych  PSYCHIATRIC DISORDERS Anxiety     negative neurological ROS     GI/Hepatic negative GI ROS,,, SYMPTOMATIC CHOLELITHIAIS   Endo/Other  diabetes, Well Controlled, Type 2  Morbid obesity  Renal/GU Renal InsufficiencyRenal disease     Musculoskeletal negative musculoskeletal ROS (+)    Abdominal   Peds  Hematology  (+) Blood dyscrasia, anemia    Anesthesia Other Findings   Reproductive/Obstetrics                             Anesthesia Physical Anesthesia Plan  ASA: 3  Anesthesia Plan: General   Post-op Pain Management: Tylenol PO (pre-op)*   Induction: Intravenous  PONV Risk Score and Plan: 3 and Midazolam, Dexamethasone and Ondansetron  Airway Management Planned: Oral ETT  Additional Equipment:   Intra-op Plan:   Post-operative Plan: Extubation in OR  Informed Consent: I have reviewed the patients History and Physical, chart, labs and discussed the procedure including the risks, benefits and alternatives for the proposed anesthesia with the patient or authorized representative who has indicated his/her understanding and acceptance.     Dental advisory given  Plan Discussed with: CRNA  Anesthesia Plan Comments: (PAT note by Antionette Poles, PA-C:  Follows with cardiology for history of HTN, palpitations, and chronic heart failure, prior history of severely depressed LVEF of 25% to 30% in the setting of nonischemic cardiomyopathy due to untreated severe hypertension. Patient did have a cardiac catheterization which revealed normal coronary arteries and a coronary calcium score of 0 by CT. He did have improvement of his LVEF in October 2022 back to 55 to 60%.  Seen by Joni Reining, NP on 12/11/2022 for preop evaluation.  Per note, "  CKD 3  Diet controlled DM2, A1c 6.9 on preop labs.  BMP and CBC 01/07/2023 reviewed, mild hyponatremia sodium 134, creatinine elevated 1.94 (recent baseline appears to be ~1.8), mild chronic anemia with hemoglobin 12.2.  EKG 12/11/2022: Sinus bradycardia.  Rate  57.   Echocardiogram 03/06/2021 1. Left ventricular ejection fraction, by estimation, is 55 to 60%. The  left ventricle has normal function. The left ventricle has no regional  wall motion abnormalities. Left ventricular diastolic parameters were  normal.  2. Right ventricular systolic function is  normal. The right ventricular  size is normal. Tricuspid regurgitation signal is inadequate for assessing  PA pressure.  3. The mitral valve is normal in structure. No evidence of mitral valve  regurgitation. No evidence of mitral stenosis.  4. The aortic valve is tricuspid. Aortic valve regurgitation is not  visualized. No aortic stenosis is present.  5. The inferior vena cava is normal in size with greater than 50%  respiratory variability, suggesting right atrial pressure of 3 mmHg.   Zio Monitor 12/29/2021    The dominant rhythm was normal sinus with normal circadian variation.   There are very rare PVCs and very rare PACs.   No evidence of complex atrial or ventricular arrhythmia.  No significant bradycardia.   Patient triggered recordings did not show any significant arrhythmia.  Some of them show sinus tachycardia and motion artifact suggesting that they were collected during physical activity, but are otherwise normal.  Normal event monitor.  No significant arrhythmia detected.  No arrhythmia during symptom triggered recordings.  Coronary CTA 04/22/2020: IMPRESSION: 1. Coronary artery calcium score 0 Agatston units, suggesting low risk for future cardiac events.  2.  No significant coronary disease noted.  Right/left cath 02/24/2019: 1. Filling pressures now normal and cardiac output preserved.  2. No significant coronary disease.   Nonischemic cardiomyopathy.    )        Anesthesia Quick Evaluation

## 2023-01-08 NOTE — Progress Notes (Signed)
Anesthesia Chart Review:  Follows with cardiology for history of HTN, palpitations, and chronic heart failure, prior history of severely depressed LVEF of 25% to 30% in the setting of nonischemic cardiomyopathy due to untreated severe hypertension. Patient did have a cardiac catheterization which revealed normal coronary arteries and a coronary calcium score of 0 by CT. He did have improvement of his LVEF in October 2022 back to 55 to 60%.  Seen by Joni Reining, NP on 12/11/2022 for preop evaluation.  Per note, "  CKD 3  Diet controlled DM2, A1c 6.9 on preop labs.  BMP and CBC 01/07/2023 reviewed, mild hyponatremia sodium 134, creatinine elevated 1.94 (recent baseline appears to be ~1.8), mild chronic anemia with hemoglobin 12.2.  EKG 12/11/2022: Sinus bradycardia.  Rate 57.   Echocardiogram 03/06/2021 1. Left ventricular ejection fraction, by estimation, is 55 to 60%. The  left ventricle has normal function. The left ventricle has no regional  wall motion abnormalities. Left ventricular diastolic parameters were  normal.   2. Right ventricular systolic function is normal. The right ventricular  size is normal. Tricuspid regurgitation signal is inadequate for assessing  PA pressure.   3. The mitral valve is normal in structure. No evidence of mitral valve  regurgitation. No evidence of mitral stenosis.   4. The aortic valve is tricuspid. Aortic valve regurgitation is not  visualized. No aortic stenosis is present.   5. The inferior vena cava is normal in size with greater than 50%  respiratory variability, suggesting right atrial pressure of 3 mmHg.    Zio Monitor 12/29/2021    The dominant rhythm was normal sinus with normal circadian variation.   There are very rare PVCs and very rare PACs.   No evidence of complex atrial or ventricular arrhythmia.  No significant bradycardia.   Patient triggered recordings did not show any significant arrhythmia.  Some of them show sinus tachycardia  and motion artifact suggesting that they were collected during physical activity, but are otherwise normal.   Normal event monitor.  No significant arrhythmia detected.  No arrhythmia during symptom triggered recordings.  Coronary CTA 04/22/2020: IMPRESSION: 1. Coronary artery calcium score 0 Agatston units, suggesting low risk for future cardiac events.   2.  No significant coronary disease noted.  Right/left cath 02/24/2019: 1. Filling pressures now normal and cardiac output preserved.  2. No significant coronary disease.    Nonischemic cardiomyopathy.    [facility]/Anesthesiology Phone [phone] 01/08/2023 12:18 PM

## 2023-01-11 ENCOUNTER — Other Ambulatory Visit (HOSPITAL_COMMUNITY): Payer: Self-pay

## 2023-01-12 ENCOUNTER — Other Ambulatory Visit (HOSPITAL_COMMUNITY): Payer: Self-pay

## 2023-01-12 ENCOUNTER — Other Ambulatory Visit: Payer: Self-pay

## 2023-01-14 ENCOUNTER — Other Ambulatory Visit (HOSPITAL_COMMUNITY): Payer: Self-pay

## 2023-01-15 ENCOUNTER — Other Ambulatory Visit (HOSPITAL_COMMUNITY): Payer: Self-pay

## 2023-01-15 ENCOUNTER — Emergency Department (HOSPITAL_BASED_OUTPATIENT_CLINIC_OR_DEPARTMENT_OTHER)
Admission: EM | Admit: 2023-01-15 | Discharge: 2023-01-15 | Disposition: A | Discharge status: Home / Self Care | Payer: Commercial Managed Care - PPO | Attending: Emergency Medicine | Admitting: Emergency Medicine

## 2023-01-15 ENCOUNTER — Other Ambulatory Visit: Payer: Self-pay

## 2023-01-15 ENCOUNTER — Encounter (HOSPITAL_COMMUNITY): Payer: Self-pay | Admitting: General Surgery

## 2023-01-15 ENCOUNTER — Ambulatory Visit (HOSPITAL_COMMUNITY): Payer: Commercial Managed Care - PPO | Admitting: Physician Assistant

## 2023-01-15 ENCOUNTER — Encounter (HOSPITAL_BASED_OUTPATIENT_CLINIC_OR_DEPARTMENT_OTHER): Payer: Self-pay

## 2023-01-15 ENCOUNTER — Encounter (HOSPITAL_COMMUNITY)
Admission: RE | Disposition: A | Discharge status: Home / Self Care | Payer: Self-pay | Source: Ambulatory Visit | Attending: General Surgery

## 2023-01-15 ENCOUNTER — Ambulatory Visit (HOSPITAL_COMMUNITY)
Admission: RE | Admit: 2023-01-15 | Discharge: 2023-01-15 | Disposition: A | Discharge status: Home / Self Care | Payer: Commercial Managed Care - PPO | Source: Ambulatory Visit | Attending: General Surgery | Admitting: General Surgery

## 2023-01-15 ENCOUNTER — Ambulatory Visit (HOSPITAL_BASED_OUTPATIENT_CLINIC_OR_DEPARTMENT_OTHER): Payer: Commercial Managed Care - PPO | Admitting: Anesthesiology

## 2023-01-15 DIAGNOSIS — E1122 Type 2 diabetes mellitus with diabetic chronic kidney disease: Secondary | ICD-10-CM | POA: Insufficient documentation

## 2023-01-15 DIAGNOSIS — I130 Hypertensive heart and chronic kidney disease with heart failure and stage 1 through stage 4 chronic kidney disease, or unspecified chronic kidney disease: Secondary | ICD-10-CM | POA: Insufficient documentation

## 2023-01-15 DIAGNOSIS — I5022 Chronic systolic (congestive) heart failure: Secondary | ICD-10-CM | POA: Insufficient documentation

## 2023-01-15 DIAGNOSIS — E669 Obesity, unspecified: Secondary | ICD-10-CM | POA: Insufficient documentation

## 2023-01-15 DIAGNOSIS — R002 Palpitations: Secondary | ICD-10-CM | POA: Insufficient documentation

## 2023-01-15 DIAGNOSIS — N182 Chronic kidney disease, stage 2 (mild): Secondary | ICD-10-CM | POA: Insufficient documentation

## 2023-01-15 DIAGNOSIS — E782 Mixed hyperlipidemia: Secondary | ICD-10-CM | POA: Diagnosis not present

## 2023-01-15 DIAGNOSIS — Z6841 Body Mass Index (BMI) 40.0 and over, adult: Secondary | ICD-10-CM | POA: Insufficient documentation

## 2023-01-15 DIAGNOSIS — Z87891 Personal history of nicotine dependence: Secondary | ICD-10-CM | POA: Insufficient documentation

## 2023-01-15 DIAGNOSIS — K8020 Calculus of gallbladder without cholecystitis without obstruction: Secondary | ICD-10-CM

## 2023-01-15 DIAGNOSIS — K8010 Calculus of gallbladder with chronic cholecystitis without obstruction: Secondary | ICD-10-CM | POA: Insufficient documentation

## 2023-01-15 DIAGNOSIS — E119 Type 2 diabetes mellitus without complications: Secondary | ICD-10-CM

## 2023-01-15 HISTORY — PX: CHOLECYSTECTOMY: SHX55

## 2023-01-15 LAB — BASIC METABOLIC PANEL
Anion gap: 9 (ref 5–15)
BUN: 21 mg/dL — ABNORMAL HIGH (ref 6–20)
CO2: 22 mmol/L (ref 22–32)
Calcium: 8.8 mg/dL — ABNORMAL LOW (ref 8.9–10.3)
Chloride: 101 mmol/L (ref 98–111)
Creatinine, Ser: 1.66 mg/dL — ABNORMAL HIGH (ref 0.61–1.24)
GFR, Estimated: 53 mL/min — ABNORMAL LOW (ref >60)
Glucose, Bld: 139 mg/dL — ABNORMAL HIGH (ref 70–99)
Potassium: 3.6 mmol/L (ref 3.5–5.1)
Sodium: 132 mmol/L — ABNORMAL LOW (ref 135–145)

## 2023-01-15 LAB — CBC WITH DIFFERENTIAL/PLATELET
Abs Immature Granulocytes: 0.03 K/uL (ref 0.00–0.07)
Basophils Absolute: 0 K/uL (ref 0.0–0.1)
Basophils Relative: 0 %
Eosinophils Absolute: 0 K/uL (ref 0.0–0.5)
Eosinophils Relative: 0 %
HCT: 37.5 % — ABNORMAL LOW (ref 39.0–52.0)
Hemoglobin: 12 g/dL — ABNORMAL LOW (ref 13.0–17.0)
Immature Granulocytes: 0 %
Lymphocytes Relative: 17 %
Lymphs Abs: 1.7 K/uL (ref 0.7–4.0)
MCH: 22.9 pg — ABNORMAL LOW (ref 26.0–34.0)
MCHC: 32 g/dL (ref 30.0–36.0)
MCV: 71.6 fL — ABNORMAL LOW (ref 80.0–100.0)
Monocytes Absolute: 0.5 K/uL (ref 0.1–1.0)
Monocytes Relative: 6 %
Neutro Abs: 7.7 K/uL (ref 1.7–7.7)
Neutrophils Relative %: 77 %
Platelets: 276 K/uL (ref 150–400)
RBC: 5.24 MIL/uL (ref 4.22–5.81)
RDW: 15.7 % — ABNORMAL HIGH (ref 11.5–15.5)
WBC: 9.9 K/uL (ref 4.0–10.5)
nRBC: 0 % (ref 0.0–0.2)

## 2023-01-15 LAB — GLUCOSE, CAPILLARY
Glucose-Capillary: 113 mg/dL — ABNORMAL HIGH (ref 70–99)
Glucose-Capillary: 139 mg/dL — ABNORMAL HIGH (ref 70–99)

## 2023-01-15 SURGERY — LAPAROSCOPIC CHOLECYSTECTOMY
Anesthesia: General

## 2023-01-15 MED ORDER — FENTANYL CITRATE (PF) 250 MCG/5ML IJ SOLN
INTRAMUSCULAR | Status: AC
  Filled 2023-01-15: qty 5

## 2023-01-15 MED ORDER — DEXAMETHASONE SODIUM PHOSPHATE 10 MG/ML IJ SOLN
INTRAMUSCULAR | Status: DC | PRN
  Administered 2023-01-15: 5 mg via INTRAVENOUS

## 2023-01-15 MED ORDER — ONDANSETRON HCL 4 MG/2ML IJ SOLN
INTRAMUSCULAR | Status: DC | PRN
  Administered 2023-01-15: 4 mg via INTRAVENOUS

## 2023-01-15 MED ORDER — BUPIVACAINE-EPINEPHRINE (PF) 0.25% -1:200000 IJ SOLN
INTRAMUSCULAR | Status: AC
  Filled 2023-01-15: qty 30

## 2023-01-15 MED ORDER — ONDANSETRON HCL 4 MG/2ML IJ SOLN: 4.0000 mg | Freq: Once | INTRAMUSCULAR | Status: DC | PRN

## 2023-01-15 MED ORDER — SUGAMMADEX SODIUM 200 MG/2ML IV SOLN
INTRAVENOUS | Status: DC | PRN
  Administered 2023-01-15: 200 mg via INTRAVENOUS

## 2023-01-15 MED ORDER — GABAPENTIN 300 MG PO CAPS
300.0000 mg | ORAL_CAPSULE | ORAL | Status: AC
  Administered 2023-01-15: 300 mg via ORAL
  Filled 2023-01-15: qty 1

## 2023-01-15 MED ORDER — DEXAMETHASONE SODIUM PHOSPHATE 10 MG/ML IJ SOLN
INTRAMUSCULAR | Status: AC
  Filled 2023-01-15: qty 1

## 2023-01-15 MED ORDER — BUPIVACAINE-EPINEPHRINE 0.25% -1:200000 IJ SOLN
INTRAMUSCULAR | Status: DC | PRN
  Administered 2023-01-15: 24 mL

## 2023-01-15 MED ORDER — ONDANSETRON HCL 4 MG/2ML IJ SOLN
INTRAMUSCULAR | Status: AC
  Filled 2023-01-15: qty 2

## 2023-01-15 MED ORDER — LIDOCAINE 2% (20 MG/ML) 5 ML SYRINGE
INTRAMUSCULAR | Status: AC
  Filled 2023-01-15: qty 5

## 2023-01-15 MED ORDER — ORAL CARE MOUTH RINSE: 15.0000 mL | Freq: Once | OROMUCOSAL | Status: AC

## 2023-01-15 MED ORDER — LIDOCAINE 2% (20 MG/ML) 5 ML SYRINGE
INTRAMUSCULAR | Status: DC | PRN
  Administered 2023-01-15: 60 mg via INTRAVENOUS

## 2023-01-15 MED ORDER — ACETAMINOPHEN 500 MG PO TABS
1000.0000 mg | ORAL_TABLET | ORAL | Status: AC
  Administered 2023-01-15: 1000 mg via ORAL
  Filled 2023-01-15: qty 2

## 2023-01-15 MED ORDER — PROPOFOL 10 MG/ML IV BOLUS
INTRAVENOUS | Status: DC | PRN
  Administered 2023-01-15: 150 mg via INTRAVENOUS

## 2023-01-15 MED ORDER — MIDAZOLAM HCL 2 MG/2ML IJ SOLN
INTRAMUSCULAR | Status: DC | PRN
  Administered 2023-01-15: 2 mg via INTRAVENOUS

## 2023-01-15 MED ORDER — OXYCODONE HCL 5 MG PO TABS
5.0000 mg | ORAL_TABLET | Freq: Four times a day (QID) | ORAL | 0 refills | Status: DC | PRN
  Filled 2023-01-15: qty 15, 4d supply, fill #0

## 2023-01-15 MED ORDER — CHLORHEXIDINE GLUCONATE 0.12 % MT SOLN
15.0000 mL | Freq: Once | OROMUCOSAL | Status: AC
  Administered 2023-01-15: 15 mL via OROMUCOSAL
  Filled 2023-01-15: qty 15

## 2023-01-15 MED ORDER — INSULIN ASPART 100 UNIT/ML IJ SOLN: 0.0000 [IU] | INTRAMUSCULAR | Status: DC | PRN

## 2023-01-15 MED ORDER — LACTATED RINGERS IV SOLN: INTRAVENOUS | Status: DC

## 2023-01-15 MED ORDER — FENTANYL CITRATE (PF) 100 MCG/2ML IJ SOLN
25.0000 ug | INTRAMUSCULAR | Status: DC | PRN
  Administered 2023-01-15: 25 ug via INTRAVENOUS

## 2023-01-15 MED ORDER — CHLORHEXIDINE GLUCONATE CLOTH 2 % EX PADS: 6.0000 | MEDICATED_PAD | Freq: Once | CUTANEOUS | Status: DC

## 2023-01-15 MED ORDER — ROCURONIUM BROMIDE 10 MG/ML (PF) SYRINGE
PREFILLED_SYRINGE | INTRAVENOUS | Status: AC
  Filled 2023-01-15: qty 10

## 2023-01-15 MED ORDER — MIDAZOLAM HCL 2 MG/2ML IJ SOLN
INTRAMUSCULAR | Status: AC
  Filled 2023-01-15: qty 2

## 2023-01-15 MED ORDER — ROCURONIUM BROMIDE 10 MG/ML (PF) SYRINGE
PREFILLED_SYRINGE | INTRAVENOUS | Status: DC | PRN
  Administered 2023-01-15: 50 mg via INTRAVENOUS

## 2023-01-15 MED ORDER — PROPOFOL 10 MG/ML IV BOLUS
INTRAVENOUS | Status: AC
  Filled 2023-01-15: qty 20

## 2023-01-15 MED ORDER — FENTANYL CITRATE (PF) 250 MCG/5ML IJ SOLN
INTRAMUSCULAR | Status: DC | PRN
  Administered 2023-01-15 (×2): 100 ug via INTRAVENOUS
  Administered 2023-01-15: 50 ug via INTRAVENOUS

## 2023-01-15 MED ORDER — AMISULPRIDE (ANTIEMETIC) 5 MG/2ML IV SOLN: 10.0000 mg | Freq: Once | INTRAVENOUS | Status: DC | PRN

## 2023-01-15 MED ORDER — CEFAZOLIN SODIUM-DEXTROSE 2-4 GM/100ML-% IV SOLN
2.0000 g | INTRAVENOUS | Status: AC
  Administered 2023-01-15: 2 g via INTRAVENOUS
  Filled 2023-01-15: qty 100

## 2023-01-15 MED ORDER — FENTANYL CITRATE (PF) 100 MCG/2ML IJ SOLN
INTRAMUSCULAR | Status: AC
  Filled 2023-01-15: qty 2

## 2023-01-15 SURGICAL SUPPLY — 45 items
ADH SKN CLS APL DERMABOND .7 (GAUZE/BANDAGES/DRESSINGS) ×1
APL PRP STRL LF DISP 70% ISPRP (MISCELLANEOUS) ×1
APPLIER CLIP 5 13 M/L LIGAMAX5 (MISCELLANEOUS) ×1
APR CLP MED LRG 5 ANG JAW (MISCELLANEOUS) ×1
BAG COUNTER SPONGE SURGICOUNT (BAG) ×1 IMPLANT
BAG SPEC RTRVL 10 TROC 200 (ENDOMECHANICALS) ×1
BAG SPNG CNTER NS LX DISP (BAG) ×1
BLADE CLIPPER SURG (BLADE) IMPLANT
CANISTER SUCT 3000ML PPV (MISCELLANEOUS) ×1 IMPLANT
CHLORAPREP W/TINT 26 (MISCELLANEOUS) ×1 IMPLANT
CLIP APPLIE 5 13 M/L LIGAMAX5 (MISCELLANEOUS) ×1 IMPLANT
COVER SURGICAL LIGHT HANDLE (MISCELLANEOUS) ×1 IMPLANT
DERMABOND ADVANCED .7 DNX12 (GAUZE/BANDAGES/DRESSINGS) ×1 IMPLANT
ELECT REM PT RETURN 9FT ADLT (ELECTROSURGICAL) ×1
ELECTRODE REM PT RTRN 9FT ADLT (ELECTROSURGICAL) ×1 IMPLANT
GLOVE BIO SURGEON STRL SZ8 (GLOVE) ×1 IMPLANT
GLOVE BIOGEL PI IND STRL 8 (GLOVE) ×1 IMPLANT
GOWN STRL REUS W/ TWL LRG LVL3 (GOWN DISPOSABLE) ×2 IMPLANT
GOWN STRL REUS W/ TWL XL LVL3 (GOWN DISPOSABLE) ×1 IMPLANT
GOWN STRL REUS W/TWL LRG LVL3 (GOWN DISPOSABLE) ×2
GOWN STRL REUS W/TWL XL LVL3 (GOWN DISPOSABLE) ×1
IRRIG SUCT STRYKERFLOW 2 WTIP (MISCELLANEOUS) ×1
IRRIGATION SUCT STRKRFLW 2 WTP (MISCELLANEOUS) ×1 IMPLANT
KIT BASIN OR (CUSTOM PROCEDURE TRAY) ×1 IMPLANT
KIT TURNOVER KIT B (KITS) ×1 IMPLANT
L-HOOK LAP DISP 36CM (ELECTROSURGICAL) ×1
LHOOK LAP DISP 36CM (ELECTROSURGICAL) ×1 IMPLANT
NDL 22X1.5 STRL (OR ONLY) (MISCELLANEOUS) ×1 IMPLANT
NEEDLE 22X1.5 STRL (OR ONLY) (MISCELLANEOUS) ×1
NS IRRIG 1000ML POUR BTL (IV SOLUTION) ×1 IMPLANT
PAD ARMBOARD 7.5X6 YLW CONV (MISCELLANEOUS) ×1 IMPLANT
PENCIL BUTTON HOLSTER BLD 10FT (ELECTRODE) ×1 IMPLANT
POUCH RETRIEVAL ECOSAC 10 (ENDOMECHANICALS) ×1 IMPLANT
SCISSORS LAP 5X35 DISP (ENDOMECHANICALS) ×1 IMPLANT
SET TUBE SMOKE EVAC HIGH FLOW (TUBING) ×1 IMPLANT
SLEEVE Z-THREAD 5X100MM (TROCAR) ×2 IMPLANT
SPECIMEN JAR SMALL (MISCELLANEOUS) ×1 IMPLANT
SUT VIC AB 4-0 PS2 27 (SUTURE) ×1 IMPLANT
TOWEL GREEN STERILE (TOWEL DISPOSABLE) ×1 IMPLANT
TOWEL GREEN STERILE FF (TOWEL DISPOSABLE) ×1 IMPLANT
TRAY LAPAROSCOPIC MC (CUSTOM PROCEDURE TRAY) ×1 IMPLANT
TROCAR BALLN 12MMX100 BLUNT (TROCAR) ×1 IMPLANT
TROCAR Z-THREAD OPTICAL 5X100M (TROCAR) ×1 IMPLANT
WARMER LAPAROSCOPE (MISCELLANEOUS) ×1 IMPLANT
WATER STERILE IRR 1000ML POUR (IV SOLUTION) ×1 IMPLANT

## 2023-01-15 NOTE — Anesthesia Postprocedure Evaluation (Signed)
Anesthesia Post Note  Patient: Derek Reed  Procedure(s) Performed: LAPAROSCOPIC CHOLECYSTECTOMY     Patient location during evaluation: PACU Anesthesia Type: General Level of consciousness: awake and alert Pain management: pain level controlled Vital Signs Assessment: post-procedure vital signs reviewed and stable Respiratory status: spontaneous breathing, nonlabored ventilation, respiratory function stable and patient connected to nasal cannula oxygen Cardiovascular status: blood pressure returned to baseline and stable Postop Assessment: no apparent nausea or vomiting Anesthetic complications: no   No notable events documented.  Last Vitals:  Vitals:   01/15/23 0915 01/15/23 0930  BP: 111/70 113/77  Pulse: 64 64  Resp: 14 14  Temp:  36.6 C  SpO2: 97% 97%    Last Pain:  Vitals:   01/15/23 0930  PainSc: Asleep                 Collene Schlichter

## 2023-01-15 NOTE — ED Provider Notes (Signed)
[place] [facility] Provider Note  CSN: [id number] Arrival date & time: 01/15/23 1820  Chief Complaint(s) Palpitations  HPI Derek Reed is a 37 y.o. male history of CHF, diabetes, CKD presenting to the emergency department with palpitations.  Patient reports he had his gallbladder removed today.  Has been feeling fine since the surgery and has had something to eat and drink.  He was lying down in the couch when he had episode of palpitations lasting only 10 minutes.  He reports that these have resolved.  Did not have any lightheadedness or dizziness, chest pain, shortness of breath, nausea or vomiting or any other symptoms.  Currently reports he feels normal.  He just wanted to get evaluated for the palpitations.   Past Medical History Past Medical History:  Diagnosis Date   Acute CHF (congestive heart failure) (HCC) 02/24/2019   Anxiety    Diabetes mellitus without complication (HCC)    Based on A1C. Pt diet controlled.   Hypertension    Obesity    Peritonsillar abscess    Patient Active Problem List   Diagnosis Date Noted   Mixed hyperlipidemia 11/19/2021   HTN (hypertension) 02/24/2019   CKD (chronic kidney disease), stage II 02/24/2019   Chronic systolic heart failure (HCC)due to Hypertension    GAD (generalized anxiety disorder) 03/26/2014   BMI 36.0-36.9,adult 03/26/2014   Home Medication(s) Prior to Admission medications   Medication Sig Start Date End Date Taking? Authorizing Provider  atorvastatin (LIPITOR) 10 MG tablet Take 1 tablet (10 mg total) by mouth daily. 06/24/22   Anders Simmonds, PA-C  Blood Pressure Monitor MISC Use to check blood pressure daily 12/11/22   Jodelle Gross, NP  carvedilol (COREG) 12.5 MG tablet Take 1 tablet by mouth 2 times daily with a meal. 12/11/22   Jodelle Gross, NP  chlorthalidone (HYGROTON) 25 MG tablet Take 1 tablet (25 mg total) by mouth daily. 12/11/22   Jodelle Gross, NP   isosorbide-hydrALAZINE (BIDIL) 20-37.5 MG tablet Take 2 tablets by mouth 2 (two) times daily. 12/11/22 12/11/23  Jodelle Gross, NP  LORazepam (ATIVAN) 1 MG tablet Take 1 tablet (1 mg total) by mouth 3 (three) times daily as needed for anxiety (may cause drowsiness). Patient not taking: Reported on 12/11/2022 12/05/22   Molpus, Jonny Ruiz, MD  nitroGLYCERIN (NITROSTAT) 0.4 MG SL tablet Place 1 tablet (0.4 mg total) under the tongue every 5 (five) minutes as needed for chest pain. 12/11/22   Jodelle Gross, NP  oxyCODONE (OXY IR/ROXICODONE) 5 MG immediate release tablet Take 1 tablet (5 mg total) by mouth every 6 (six) hours as needed for up to 15 doses for severe pain. 01/15/23   Violeta Gelinas, MD  potassium chloride SA (KLOR-CON M) 20 MEQ tablet Take 1 tablet by mouth 2 times daily. 12/11/22   Jodelle Gross, NP  sacubitril-valsartan (ENTRESTO) 97-103 MG Take 1 tablet by mouth 2 times daily. 12/11/22   Jodelle Gross, NP  spironolactone (ALDACTONE) 25 MG tablet Take 1 tablet (25 mg total) by mouth daily. 12/11/22 12/11/23  Jodelle Gross, NP  Past Surgical History Past Surgical History:  Procedure Laterality Date   CHOLECYSTECTOMY     INCISION AND DRAINAGE OF PERITONSILLAR ABCESS     RIGHT/LEFT HEART CATH AND CORONARY ANGIOGRAPHY N/A 02/24/2019   Procedure: RIGHT/LEFT HEART CATH AND CORONARY ANGIOGRAPHY;  Surgeon: Laurey Morale, MD;  Location: [facility] INVASIVE CV LAB;  Service: Cardiovascular;  Laterality: N/A;   Family History Family History  Problem Relation Age of Onset   Hyperlipidemia Mother    Diabetes Mother    Stroke Mother    Hypertension Mother    Hypertension Father     Social History Social History   Tobacco Use   Smoking status: Former   Smokeless tobacco: Never  Advertising account planner   Vaping status: Never Used  Substance Use Topics   Alcohol use:  No   Drug use: No   Allergies Egg-derived products  Review of Systems Review of Systems  All other systems reviewed and are negative.   Physical Exam Vital Signs  I have reviewed the triage vital signs BP 107/70   Pulse 75   Temp 99.2 F (37.3 C) (Temporal)   Resp (!) 21   Ht 5\' 6"  (1.676 m)   Wt 113 kg   SpO2 97%   BMI 40.21 kg/m  Physical Exam Vitals and nursing note reviewed.  Constitutional:      General: He is not in acute distress.    Appearance: Normal appearance.  HENT:     Mouth/Throat:     Mouth: Mucous membranes are moist.  Eyes:     Conjunctiva/sclera: Conjunctivae normal.  Cardiovascular:     Rate and Rhythm: Normal rate and regular rhythm.  Pulmonary:     Effort: Pulmonary effort is normal. No respiratory distress.     Breath sounds: Normal breath sounds.  Abdominal:     General: Abdomen is flat.     Palpations: Abdomen is soft.     Comments: Multiple laparoscopy sites with Dermabond overlying.  Appropriate mild diffuse abdominal tenderness after surgery.  No peritoneal signs.  Musculoskeletal:     Right lower leg: No edema.     Left lower leg: No edema.  Skin:    General: Skin is warm and dry.     Capillary Refill: Capillary refill takes less than 2 seconds.  Neurological:     Mental Status: He is alert and oriented to person, place, and time. Mental status is at baseline.  Psychiatric:        Mood and Affect: Mood normal.        Behavior: Behavior normal.     ED Results and Treatments Labs (all labs ordered are listed, but only abnormal results are displayed) Labs Reviewed  BASIC METABOLIC PANEL - Abnormal; Notable for the following components:      Result Value   Sodium 132 (*)    Glucose, Bld 139 (*)    BUN 21 (*)    Creatinine, Ser 1.66 (*)    Calcium 8.8 (*)    GFR, Estimated 53 (*)    All other components within normal limits  CBC WITH DIFFERENTIAL/PLATELET - Abnormal; Notable for the following components:   Hemoglobin 12.0  (*)    HCT 37.5 (*)    MCV 71.6 (*)    MCH 22.9 (*)    RDW 15.7 (*)    All other components within normal limits  Radiology No results found.  Pertinent labs & imaging results that were available during my care of the patient were reviewed by me and considered in my medical decision making (see MDM for details).  Medications Ordered in ED Medications - No data to display                                                                                                                                   Procedures Procedures  (including critical care time)  Medical Decision Making / ED Course   MDM:  37 year old presenting with palpitations.  Patient well-appearing, EKG shows sinus rhythm.  He denies any other symptoms during this episode of palpitations such as chest pain, shortness of breath, lightheadedness, had no presyncopal symptoms.Marland Kitchen  He is currently totally asymptomatic.  Will check basic labs, electrolytes.  He does have CHF but appears very euvolemic.  If reassuring, anticipate discharge.  Reviewed chart which shows patient had a normal event monitor last year.  Advise follow-up with his cardiologist again.  Placed cardiology referral to help facilitate follow-up appointment.  Clinical Course as of 01/15/23 2039  Fri Jan 15, 2023  2039 Labs same at baseline. Will discharge patient to home. All questions answered. Patient comfortable with plan of discharge. Return precautions discussed with patient and specified on the after visit summary.  [WS]    Clinical Course User Index [WS] Lonell Grandchild, MD     Additional history obtained:  -External records from outside source obtained and reviewed including: Chart review including previous notes, labs, imaging, consultation notes including prior event monitoring   Lab Tests: -I ordered, reviewed, and  interpreted labs.   The pertinent results include:   Labs Reviewed  BASIC METABOLIC PANEL - Abnormal; Notable for the following components:      Result Value   Sodium 132 (*)    Glucose, Bld 139 (*)    BUN 21 (*)    Creatinine, Ser 1.66 (*)    Calcium 8.8 (*)    GFR, Estimated 53 (*)    All other components within normal limits  CBC WITH DIFFERENTIAL/PLATELET - Abnormal; Notable for the following components:   Hemoglobin 12.0 (*)    HCT 37.5 (*)    MCV 71.6 (*)    MCH 22.9 (*)    RDW 15.7 (*)    All other components within normal limits    Notable for ckd, reassuring electrolytes  EKG   EKG Interpretation Date/Time:  Friday January 15 2023 18:33:07 EDT Ventricular Rate:  76 PR Interval:  177 QRS Duration:  82 QT Interval:  351 QTC Calculation: 395 R Axis:   -14  Text Interpretation: Sinus rhythm Left ventricular hypertrophy Confirmed by Alvino Blood ([id number]) on 01/15/2023 7:13:15 PM         Medicines ordered and prescription drug management: No orders of the defined types were placed in this encounter.   -I have reviewed the patients  home medicines and have made adjustments as needed  Cardiac Monitoring: The patient was maintained on a cardiac monitor.  I personally viewed and interpreted the cardiac monitored which showed an underlying rhythm of: NSR  Social Determinants of Health:  Diagnosis or treatment significantly limited by social determinants of health: obesity   Reevaluation: After the interventions noted above, I reevaluated the patient and found that their symptoms have improved  Co morbidities that complicate the patient evaluation  Past Medical History:  Diagnosis Date   Acute CHF (congestive heart failure) (HCC) 02/24/2019   Anxiety    Diabetes mellitus without complication (HCC)    Based on A1C. Pt diet controlled.   Hypertension    Obesity    Peritonsillar abscess       Dispostion: Disposition decision including need for  hospitalization was considered, and patient discharged from emergency department.    Final Clinical Impression(s) / ED Diagnoses Final diagnoses:  Palpitations     This chart was dictated using voice recognition software.  Despite best efforts to proofread,  errors can occur which can change the documentation meaning.    Lonell Grandchild, MD 01/15/23 2039

## 2023-01-15 NOTE — Discharge Instructions (Addendum)
We evaluated you for your palpitations.  Your laboratory testing was reassuring.  Your palpitations could be related to your surgery.  Your EKG in the emergency department showed a normal rhythm.  I would recommend following up with your cardiologist for further evaluation.  You may need to have another cardiac event monitor done.  Since you did not have any other symptoms like chest pain, difficulty breathing, or fainting, I do not think we need to keep you in the hospital.  If you do develop any new symptoms such as chest pain, fainting, difficulty breathing, lightheadedness or dizziness, vomiting, or any other new symptoms, please return so we can reassess you.

## 2023-01-15 NOTE — Op Note (Signed)
  01/15/2023  8:24 AM  PATIENT:  Derek Reed  37 y.o. male  PRE-OPERATIVE DIAGNOSIS:  SYMPTOMATIC CHOLELITHIAIS  POST-OPERATIVE DIAGNOSIS:  SYMPTOMATIC CHOLELITHIAIS  PROCEDURE:  Procedure(s): LAPAROSCOPIC CHOLECYSTECTOMY  SURGEON: Violeta Gelinas, MD  ASSISTANTS: Marin Olp, MD  ANESTHESIA:   local and general  EBL:  Total I/O In: 1100 [I.V.:1000; IV Piggyback:100] Out: 10 [Blood:10]  BLOOD ADMINISTERED:none  DRAINS: none   SPECIMEN:  Excision  DISPOSITION OF SPECIMEN:  PATHOLOGY  COUNTS:  YES  DICTATION: .Dragon Dictation Procedure detail: Informed consent was obtained.  He received intravenous antibiotics.  He was brought to the operating room and general endotracheal anesthesia was administered by the anesthesia staff.  His abdomen was prepped and draped in a sterile fashion.  We did a timeout procedure.The infraumbilical region was infiltrated with local. Infraumbilical incision was made. Subcutaneous tissues were dissected down revealing the anterior fascia. This was divided sharply along the midline. Peritoneal cavity was entered under direct vision without complication. A 0 Vicryl pursestring was placed around the fascial opening. Hassan trocar was inserted into the abdomen. The abdomen was insufflated with carbon dioxide in standard fashion. Under direct vision a 5 mm epigastric and 5 mm right abdominal port x 2 were placed.  Local was used at each port site.  Laparoscopic exploration revealed evidence of some chronic inflammation of his gallbladder.  There was omentum stuck to the dome, body, and infundibulum.  This was carefully dissected away.  The dome was retracted superior and medially.  The infundibulum was retracted inferior and laterally.  Dissection began laterally and progressed medially identifying the cystic duct.  Dissection continued until we had a critical view of safety.  I then placed 3 clips proximally on the cystic duct and 1 clip distally.  It was  divided.  Further dissection revealed a small anterior branch of the cystic artery.  This was clipped twice proximally and divided.  The main part of the cystic artery was then identified.  It was clipped twice proximally and divided distally with cautery.  The gallbladder was taken off the liver bed using cautery and achieving excellent hemostasis along the way.  The gallbladder was placed in a bag and removed from the abdomen.  It was sent to pathology.  The liver bed was irrigated and it was dry.  Clips remain in good position.  Four-quadrant inspection revealed no complicating features.  Ports were removed under direct vision.  Pneumoperitoneum was released.  Infraumbilical fascia was closed by tying the pursestring.  All 4 wounds were irrigated and the skin of each was closed with 4-0 Vicryl followed by Dermabond.  All counts were correct.  He tolerated the procedure well without apparent complication and was taken recovery in stable condition.  PATIENT DISPOSITION:  PACU - hemodynamically stable.   Delay start of Pharmacological VTE agent (>24hrs) due to surgical blood loss or risk of bleeding:  no  Violeta Gelinas, MD, MPH, FACS Pager: [phone]  9/6/20248:24 AM

## 2023-01-15 NOTE — Transfer of Care (Signed)
Immediate Anesthesia Transfer of Care Note  Patient: Derek Reed  Procedure(s) Performed: LAPAROSCOPIC CHOLECYSTECTOMY  Patient Location: PACU  Anesthesia Type:General  Level of Consciousness: awake, alert , and oriented  Airway & Oxygen Therapy: Patient Spontanous Breathing and Patient connected to face mask oxygen  Post-op Assessment: Report given to RN and Post -op Vital signs reviewed and stable  Post vital signs: Reviewed and stable  Last Vitals:  Vitals Value Taken Time  BP 108/65 01/15/23 0834  Temp    Pulse 74 01/15/23 0835  Resp 16 01/15/23 0835  SpO2 95 % 01/15/23 0835  Vitals shown include unfiled device data.  Last Pain:  Vitals:   01/15/23 0603  PainSc: 0-No pain      Patients Stated Pain Goal: 0 (01/15/23 0603)  Complications: No notable events documented.

## 2023-01-15 NOTE — H&P (Signed)
Derek Reed is an 37 y.o. male.   Chief Complaint: symptomatic cholelithiasis HPI: Patient presents for laparoscopic cholecystectomy.  He has not had another severe attack since I saw him in the office.  He underwent cardiac clearance.  Past Medical History:  Diagnosis Date   Acute CHF (congestive heart failure) (HCC) 02/24/2019   Anxiety    Diabetes mellitus without complication (HCC)    Based on A1C. Pt diet controlled.   Hypertension    Obesity    Peritonsillar abscess     Past Surgical History:  Procedure Laterality Date   INCISION AND DRAINAGE OF PERITONSILLAR ABCESS     RIGHT/LEFT HEART CATH AND CORONARY ANGIOGRAPHY N/A 02/24/2019   Procedure: RIGHT/LEFT HEART CATH AND CORONARY ANGIOGRAPHY;  Surgeon: Laurey Morale, MD;  Location: [facility] INVASIVE CV LAB;  Service: Cardiovascular;  Laterality: N/A;    Family History  Problem Relation Age of Onset   Hyperlipidemia Mother    Diabetes Mother    Stroke Mother    Hypertension Mother    Hypertension Father    Social History:  reports that he has quit smoking. He has never used smokeless tobacco. He reports that he does not drink alcohol and does not use drugs.  Allergies:  Allergies  Allergen Reactions   Egg-Derived Products Nausea And Vomiting    Medications Prior to Admission  Medication Sig Dispense Refill   atorvastatin (LIPITOR) 10 MG tablet Take 1 tablet (10 mg total) by mouth daily. 90 tablet 3   carvedilol (COREG) 12.5 MG tablet Take 1 tablet by mouth 2 times daily with a meal. 60 tablet 6   chlorthalidone (HYGROTON) 25 MG tablet Take 1 tablet (25 mg total) by mouth daily. 30 tablet 6   isosorbide-hydrALAZINE (BIDIL) 20-37.5 MG tablet Take 2 tablets by mouth 2 (two) times daily. 120 tablet 3   nitroGLYCERIN (NITROSTAT) 0.4 MG SL tablet Place 1 tablet (0.4 mg total) under the tongue every 5 (five) minutes as needed for chest pain. 25 tablet 3   potassium chloride SA (KLOR-CON M) 20 MEQ tablet Take 1 tablet by mouth  2 times daily. 60 tablet 3   sacubitril-valsartan (ENTRESTO) 97-103 MG Take 1 tablet by mouth 2 times daily. 60 tablet 5   spironolactone (ALDACTONE) 25 MG tablet Take 1 tablet (25 mg total) by mouth daily. 90 tablet 1   Blood Pressure Monitor MISC Use to check blood pressure daily 1 each 0   LORazepam (ATIVAN) 1 MG tablet Take 1 tablet (1 mg total) by mouth 3 (three) times daily as needed for anxiety (may cause drowsiness). (Patient not taking: Reported on 12/11/2022) 15 tablet 0    Results for orders placed or performed during the hospital encounter of 01/15/23 (from the past 48 hour(s))  Glucose, capillary     Status: Abnormal   Collection Time: 01/15/23  6:01 AM  Result Value Ref Range   Glucose-Capillary 113 (H) 70 - 99 mg/dL    Comment: Glucose reference range applies only to samples taken after fasting for at least 8 hours.   No results found.  Review of Systems  Blood pressure 105/63, pulse 75, temperature 98.1 F (36.7 C), resp. rate 18, height 5\' 6"  (1.676 m), weight 113.4 kg, SpO2 95%. Physical Exam Cardiovascular:     Rate and Rhythm: Normal rate and regular rhythm.  Pulmonary:     Effort: Pulmonary effort is normal.     Breath sounds: Normal breath sounds.  Abdominal:     General: Abdomen is flat.  Palpations: Abdomen is soft.     Tenderness: There is no abdominal tenderness. There is no guarding or rebound.  Musculoskeletal:        General: Normal range of motion.  Skin:    General: Skin is warm.  Neurological:     Mental Status: He is alert and oriented to person, place, and time.      Assessment/Plan Symptomatic cholelithiasis -for laparoscopic cholecystectomy.  Procedure, risks, benefits were again discussed in detail.  I also discussed the expected postoperative course.  He is agreeable.  His job at [employer]  requires intermittent heavy lifting so he will need to be off for 4 weeks.    Liz Malady, MD 01/15/2023, 6:53 AM

## 2023-01-15 NOTE — Anesthesia Procedure Notes (Signed)
Procedure Name: Intubation Date/Time: 01/15/2023 7:38 AM  Performed by: Sheppard Evens, CRNAPre-anesthesia Checklist: Patient identified, Emergency Drugs available, Suction available and Patient being monitored Patient Re-evaluated:Patient Re-evaluated prior to induction Oxygen Delivery Method: Circle system utilized Preoxygenation: Pre-oxygenation with 100% oxygen Induction Type: IV induction Ventilation: Mask ventilation without difficulty Laryngoscope Size: Mac and 3 Grade View: Grade I Tube type: Oral Tube size: 7.5 mm Number of attempts: 1 Airway Equipment and Method: Stylet and Oral airway Placement Confirmation: ETT inserted through vocal cords under direct vision, positive ETCO2 and breath sounds checked- equal and bilateral Secured at: 23 cm Tube secured with: Tape Dental Injury: Teeth and Oropharynx as per pre-operative assessment

## 2023-01-15 NOTE — ED Triage Notes (Signed)
The patient had his gallbladder removed today. He stated he started feeling like his heart was racing 10 minutes ago. No chest pain or shortness of breath.

## 2023-01-16 ENCOUNTER — Encounter (HOSPITAL_COMMUNITY): Payer: Self-pay | Admitting: General Surgery

## 2023-01-18 LAB — SURGICAL PATHOLOGY

## 2023-01-20 ENCOUNTER — Other Ambulatory Visit (HOSPITAL_COMMUNITY): Payer: Self-pay

## 2023-01-23 NOTE — Progress Notes (Unsigned)
Established Patient Office Visit  Subjective   Patient ID: Derek Reed, male    DOB: 11/12/1985  Age: 37 y.o. MRN: [id number]  No chief complaint on file.    11/2021 [name] is a 37 year old male with history of Chronic Kidney Disease stage 3, Type 2 Diabetes, Chronic Systolic heart failure, and anxiety who returns to office today. Patient has recovered his ejection fraction, last value 55-60% on  most recent Echo 02/2021. States he has been juicing with fresh fruit recently for his meals. Reports his stress still remains quite high as he is thinking a lot while at work and is interested in behavioral therapy, previously recommended last visit.    Today's blood pressure is 154/87,states he has been compliant with medications amlodipine, Entresto, Coreg, BiDil, potassium, and spironolactone. He has not been taking his pressures at home and plans to buy a machine soon. He does note some recent headaches which happen intermittently and self resolve, he believes this may be stress/work related.   01/26/23    Patient Active Problem List   Diagnosis Date Noted  . Mixed hyperlipidemia 11/19/2021  . HTN (hypertension) 02/24/2019  . 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 GAD (generalized anxiety disorder) 03/26/2014  . BMI 36.0-36.9,adult 03/26/2014   Past Medical History:  Diagnosis Date  . Acute CHF (congestive heart failure) (HCC) 02/24/2019  . Anxiety   . Diabetes mellitus without complication (HCC)    Based on A1C. Pt diet controlled.  . Hypertension   . Obesity   . Peritonsillar abscess    Past Surgical History:  Procedure Laterality Date  . CHOLECYSTECTOMY    . CHOLECYSTECTOMY N/A 01/15/2023   Procedure: LAPAROSCOPIC CHOLECYSTECTOMY;  Surgeon: Violeta Gelinas, MD;  Location: [facility] OR;  Service: General;  Laterality: N/A;  . INCISION AND DRAINAGE OF PERITONSILLAR ABCESS    . RIGHT/LEFT HEART CATH AND  CORONARY ANGIOGRAPHY N/A 02/24/2019   Procedure: RIGHT/LEFT HEART CATH AND CORONARY ANGIOGRAPHY;  Surgeon: Laurey Morale, MD;  Location: [facility] INVASIVE CV LAB;  Service: Cardiovascular;  Laterality: N/A;   Social History   Tobacco Use  . Smoking status: Former  . Smokeless tobacco: Never  Vaping Use  . Vaping status: Never Used  Substance Use Topics  . Alcohol use: No  . Drug use: No   Social History   Socioeconomic History  . Marital status: Married    Spouse name: Not on file  . Number of children: Not on file  . Years of education: Not on file  . Highest education level: Not on file  Occupational History  . Not on file  Tobacco Use  . Smoking status: Former  . Smokeless tobacco: Never  Vaping Use  . Vaping status: Never Used  Substance and Sexual Activity  . Alcohol use: No  . Drug use: No  . Sexual activity: Not on file  Other Topics Concern  . Not on file  Social History Narrative  . Not on file   Social Determinants of Health   Financial Resource Strain: Low Risk  (07/31/2022)   Overall Financial Resource Strain (CARDIA)   . Difficulty of Paying Living Expenses: Not very hard  Food Insecurity: No Food Insecurity (07/31/2022)   Hunger Vital Sign   . Worried About Programme researcher, broadcasting/film/video in the Last Year: Never true   . Ran Out of Food in the Last Year: Never true  Transportation Needs: No  Transportation Needs (07/31/2022)   PRAPARE - Transportation   . Lack of Transportation (Medical): No   . Lack of Transportation (Non-Medical): No  Physical Activity: Insufficiently Active (07/31/2022)   Exercise Vital Sign   . Days of Exercise per Week: 2 days   . Minutes of Exercise per Session: 60 min  Stress: Stress Concern Present (07/31/2022)   [employer] of Occupational Health - Occupational Stress Questionnaire   . Feeling of Stress : To some extent  Social Connections: Socially Integrated (07/31/2022)   Social Connection and Isolation Panel [NHANES]   .  Frequency of Communication with Friends and Family: More than three times a week   . Frequency of Social Gatherings with Friends and Family: More than three times a week   . Attends Religious Services: More than 4 times per year   . Active Member of Clubs or Organizations: Yes   . Attends Banker Meetings: More than 4 times per year   . Marital Status: Married  Catering manager Violence: Unknown (07/31/2022)   Humiliation, Afraid, Rape, and Kick questionnaire   . Fear of Current or Ex-Partner: No   . Emotionally Abused: Not on file   . Physically Abused: No   . Sexually Abused: No   Family Status  Relation Name Status  . Mother  (Not Specified)  . Father  (Not Specified)  No partnership data on file   Family History  Problem Relation Age of Onset  . Hyperlipidemia Mother   . Diabetes Mother   . Stroke Mother   . Hypertension Mother   . Hypertension Father    Allergies  Allergen Reactions  . Egg-Derived Products Nausea And Vomiting      Review of Systems  Constitutional: Negative.  Negative for chills, diaphoresis, fever, malaise/fatigue and weight loss.  HENT:  Negative for congestion, hearing loss, nosebleeds, sore throat and tinnitus.   Eyes: Negative.  Negative for blurred vision, photophobia and redness.  Respiratory: Negative.  Negative for cough, hemoptysis, sputum production, shortness of breath, wheezing and stridor.   Cardiovascular: Negative.  Negative for chest pain, palpitations, orthopnea, claudication, leg swelling and PND.  Gastrointestinal: Negative.  Negative for abdominal pain, blood in stool, constipation, diarrhea, heartburn, nausea and vomiting.  Genitourinary: Negative.  Negative for dysuria, flank pain, frequency, hematuria and urgency.  Musculoskeletal: Negative.  Negative for back pain, falls, joint pain, myalgias and neck pain.  Skin: Negative.  Negative for itching and rash.  Neurological:  Positive for headaches. Negative for  dizziness, tingling, tremors, sensory change, speech change, focal weakness, seizures, loss of consciousness and weakness.  Endo/Heme/Allergies: Negative.  Negative for environmental allergies and polydipsia. Does not bruise/bleed easily.  Psychiatric/Behavioral: Negative.  Negative for depression, memory loss, substance abuse and suicidal ideas. The patient is not nervous/anxious and does not have insomnia.       Objective:     There were no vitals taken for this visit. BP Readings from Last 3 Encounters:  01/15/23 110/64  01/15/23 113/77  01/07/23 104/68   Wt Readings from Last 3 Encounters:  01/15/23 249 lb 1.9 oz (113 kg)  01/15/23 250 lb (113.4 kg)  01/07/23 253 lb 6.4 oz (114.9 kg)      Physical Exam Vitals reviewed.  Constitutional:      General: He is awake.     Appearance: Normal appearance. He is well-developed and overweight. He is not diaphoretic.  HENT:     Head: Normocephalic and atraumatic.     Nose: No nasal  deformity, septal deviation, mucosal edema or rhinorrhea.     Right Sinus: No maxillary sinus tenderness or frontal sinus tenderness.     Left Sinus: No maxillary sinus tenderness or frontal sinus tenderness.     Mouth/Throat:     Pharynx: No oropharyngeal exudate.  Eyes:     General: Lids are normal. No scleral icterus.    Conjunctiva/sclera: Conjunctivae normal.     Pupils: Pupils are equal, round, and reactive to light.  Neck:     Thyroid: No thyromegaly.     Vascular: No carotid bruit or JVD.     Trachea: Trachea normal. No tracheal tenderness or tracheal deviation.  Cardiovascular:     Rate and Rhythm: Normal rate and regular rhythm.     Chest Wall: PMI is not displaced.     Pulses: Normal pulses. No decreased pulses.     Heart sounds: Normal heart sounds, S1 normal and S2 normal. Heart sounds not distant. No murmur heard.    No systolic murmur is present.     No diastolic murmur is present.     No friction rub. No gallop. No S3 or S4 sounds.   Pulmonary:     Effort: Pulmonary effort is normal. No tachypnea, accessory muscle usage or respiratory distress.     Breath sounds: Normal breath sounds. No stridor. No decreased breath sounds, wheezing, rhonchi or rales.  Chest:     Chest wall: No tenderness.  Abdominal:     General: Bowel sounds are normal. There is no distension.     Palpations: Abdomen is soft. Abdomen is not rigid.     Tenderness: There is no abdominal tenderness. There is no guarding or rebound.  Musculoskeletal:        General: Normal range of motion.     Cervical back: Normal range of motion and neck supple. No edema, erythema or rigidity. No muscular tenderness. Normal range of motion.     Right lower leg: Laceration (anterior lower leg, 4 inch healing laceration, no signs infection) present. 1+ Edema present.     Left lower leg: Normal. No edema.  Lymphadenopathy:     Head:     Right side of head: No submental or submandibular adenopathy.     Left side of head: No submental or submandibular adenopathy.     Cervical: No cervical adenopathy.  Skin:    General: Skin is warm and dry.     Coloration: Skin is not pale.     Findings: No rash.     Nails: There is no clubbing.  Neurological:     General: No focal deficit present.     Mental Status: He is alert and oriented to person, place, and time.     Sensory: No sensory deficit.  Psychiatric:        Attention and Perception: Attention and perception normal.        Mood and Affect: Mood normal.        Speech: Speech normal.        Behavior: Behavior normal. Behavior is cooperative.     No results found for any visits on 01/26/23.  Last CBC Lab Results  Component Value Date   WBC 9.9 01/15/2023   HGB 12.0 (L) 01/15/2023   HCT 37.5 (L) 01/15/2023   MCV 71.6 (L) 01/15/2023   MCH 22.9 (L) 01/15/2023   RDW 15.7 (H) 01/15/2023   PLT 276 01/15/2023   Last metabolic panel Lab Results  Component Value Date   GLUCOSE 139 (  H) 01/15/2023   NA 132 (L)  01/15/2023   K 3.6 01/15/2023   CL 101 01/15/2023   CO2 22 01/15/2023   BUN 21 (H) 01/15/2023   CREATININE 1.66 (H) 01/15/2023   GFRNONAA 53 (L) 01/15/2023   CALCIUM 8.8 (L) 01/15/2023   PROT 8.0 11/03/2022   ALBUMIN 3.8 11/03/2022   LABGLOB 3.2 06/24/2022   AGRATIO 1.4 06/24/2022   BILITOT 0.5 11/03/2022   ALKPHOS 78 11/03/2022   AST 18 11/03/2022   ALT 20 11/03/2022   ANIONGAP 9 01/15/2023   Last lipids Lab Results  Component Value Date   CHOL 162 11/18/2021   HDL 33 (L) 11/18/2021   LDLCALC 103 (H) 11/18/2021   TRIG 143 11/18/2021   CHOLHDL 4.9 11/18/2021   Last hemoglobin A1c Lab Results  Component Value Date   HGBA1C 6.9 (H) 01/07/2023   Last thyroid functions Lab Results  Component Value Date   TSH 1.186 07/14/2022   Last vitamin D No results found for: "25OHVITD2", "25OHVITD3", "VD25OH"    The ASCVD Risk score (Arnett DK, et al., 2019) failed to calculate for the following reasons:   The 2019 ASCVD risk score is only valid for ages 63 to 38    Assessment & Plan:   Problem List Items Addressed This Visit   None    No follow-ups on file.    Shan Levans, MD

## 2023-01-26 ENCOUNTER — Encounter: Payer: Self-pay | Admitting: Critical Care Medicine

## 2023-01-26 ENCOUNTER — Other Ambulatory Visit (HOSPITAL_COMMUNITY): Payer: Self-pay

## 2023-01-26 ENCOUNTER — Ambulatory Visit: Payer: Commercial Managed Care - PPO | Attending: Critical Care Medicine | Admitting: Critical Care Medicine

## 2023-01-26 VITALS — BP 96/60 | HR 75 | Wt 254.4 lb

## 2023-01-26 DIAGNOSIS — I10 Essential (primary) hypertension: Secondary | ICD-10-CM

## 2023-01-26 DIAGNOSIS — F411 Generalized anxiety disorder: Secondary | ICD-10-CM | POA: Diagnosis not present

## 2023-01-26 DIAGNOSIS — N183 Chronic kidney disease, stage 3 unspecified: Secondary | ICD-10-CM | POA: Insufficient documentation

## 2023-01-26 DIAGNOSIS — I5022 Chronic systolic (congestive) heart failure: Secondary | ICD-10-CM | POA: Diagnosis not present

## 2023-01-26 DIAGNOSIS — R7303 Prediabetes: Secondary | ICD-10-CM | POA: Diagnosis not present

## 2023-01-26 LAB — GLUCOSE, POCT (MANUAL RESULT ENTRY): POC Glucose: 154 mg/dL — AB (ref 70–99)

## 2023-01-26 MED ORDER — ENTRESTO 97-103 MG PO TABS
1.0000 | ORAL_TABLET | Freq: Two times a day (BID) | ORAL | 5 refills | Status: DC
  Filled 2023-01-26 – 2023-02-16 (×2): qty 60, 30d supply, fill #0
  Filled 2023-03-25: qty 60, 30d supply, fill #1
  Filled 2023-04-26: qty 60, 30d supply, fill #2
  Filled 2023-05-30: qty 60, 30d supply, fill #3
  Filled 2023-07-07: qty 60, 30d supply, fill #4
  Filled 2023-08-01: qty 60, 30d supply, fill #5

## 2023-01-26 MED ORDER — ISOSORB DINITRATE-HYDRALAZINE 20-37.5 MG PO TABS
2.0000 | ORAL_TABLET | Freq: Two times a day (BID) | ORAL | 3 refills | Status: AC
Start: 2023-01-26 — End: ?
  Filled 2023-01-26 – 2023-02-16 (×2): qty 120, 30d supply, fill #0
  Filled 2023-03-25 (×2): qty 120, 30d supply, fill #1
  Filled 2023-04-26: qty 120, 30d supply, fill #2

## 2023-01-26 MED ORDER — CARVEDILOL 12.5 MG PO TABS
12.5000 mg | ORAL_TABLET | Freq: Two times a day (BID) | ORAL | 6 refills | Status: DC
  Filled 2023-01-26 – 2023-02-16 (×2): qty 60, 30d supply, fill #0
  Filled 2023-03-25: qty 60, 30d supply, fill #1
  Filled 2023-04-26: qty 60, 30d supply, fill #2
  Filled 2023-05-30: qty 60, 30d supply, fill #3
  Filled 2023-07-07: qty 60, 30d supply, fill #4
  Filled 2023-08-01: qty 60, 30d supply, fill #5
  Filled 2023-09-12: qty 60, 30d supply, fill #6

## 2023-01-26 MED ORDER — POTASSIUM CHLORIDE CRYS ER 20 MEQ PO TBCR
20.0000 meq | EXTENDED_RELEASE_TABLET | Freq: Two times a day (BID) | ORAL | 3 refills | Status: DC
Start: 2023-01-26 — End: 2023-06-17
  Filled 2023-01-26 – 2023-02-16 (×2): qty 60, 30d supply, fill #0
  Filled 2023-03-25: qty 60, 30d supply, fill #1
  Filled 2023-04-26: qty 60, 30d supply, fill #2
  Filled 2023-05-30: qty 60, 30d supply, fill #3

## 2023-01-26 MED ORDER — SPIRONOLACTONE 25 MG PO TABS
25.0000 mg | ORAL_TABLET | Freq: Every day | ORAL | 1 refills | Status: DC
Start: 2023-01-26 — End: 2023-08-27
  Filled 2023-01-26: qty 90, 90d supply, fill #0
  Filled 2023-02-16: qty 30, 30d supply, fill #0
  Filled 2023-03-25: qty 30, 30d supply, fill #1
  Filled 2023-04-26: qty 30, 30d supply, fill #2
  Filled 2023-05-30: qty 30, 30d supply, fill #3
  Filled 2023-07-07: qty 30, 30d supply, fill #4
  Filled 2023-08-01: qty 30, 30d supply, fill #5

## 2023-01-26 MED ORDER — CHLORTHALIDONE 25 MG PO TABS
25.0000 mg | ORAL_TABLET | Freq: Every day | ORAL | 6 refills | Status: DC
  Filled 2023-01-26 – 2023-02-16 (×2): qty 30, 30d supply, fill #0
  Filled 2023-03-25: qty 30, 30d supply, fill #1
  Filled 2023-04-26: qty 30, 30d supply, fill #2
  Filled 2023-05-30: qty 30, 30d supply, fill #3
  Filled 2023-07-10: qty 30, 30d supply, fill #4
  Filled 2023-08-08: qty 30, 30d supply, fill #5
  Filled 2023-09-12: qty 30, 30d supply, fill #6

## 2023-01-26 MED ORDER — ATORVASTATIN CALCIUM 10 MG PO TABS
10.0000 mg | ORAL_TABLET | Freq: Every day | ORAL | 3 refills | Status: DC
Start: 2023-01-26 — End: 2023-03-11
  Filled 2023-01-26: qty 90, 90d supply, fill #0
  Filled 2023-02-25: qty 30, 30d supply, fill #0

## 2023-01-26 NOTE — Assessment & Plan Note (Signed)
Patient says he did not pick up previously ordered prescription for Lorazepam. He says his anxiety is "better".

## 2023-01-26 NOTE — Progress Notes (Signed)
Bs 154

## 2023-01-26 NOTE — Patient Instructions (Addendum)
Patient will continue to monitor CBGs. Patient unable to receive flu shot due to egg allergy. He will follow up with pharmacy for egg-free vaccine. Continue to take medications as ordered. Patient advised not to take medications for erectile dysfunction. Referral sent to cardiology for heart failure follow up.  Return to clinic in 4 months for follow up and as needed.    Purchase a blood pressure meter any pharmacy is only about 40-50 dollars All medications refilled  no changes in medication If A1C is high you will need to start an oral medication for diabetes Follow healthy diet as below Can go to our pharmacy for flu vaccine without eggs Follow up 4 months        Advice for Weight Management   -For most of [place] the best way to lose weight is by diet management. Generally speaking, diet management means consuming less calories intentionally which over time brings about progressive weight loss.  This can be achieved more effectively by avoiding ultra processed carbohydrates, processed meats, unhealthy fats.    It is critically important to know your numbers: how much calorie you are consuming and how much calorie you need. More importantly, our carbohydrates sources should be unprocessed naturally occurring  complex starch food items.  It is always important to balance nutrition also by  appropriate intake of proteins (mainly plant-based), healthy fats/oils, plenty of fruits and vegetables.    -The American College of Lifestyle Medicine (ACL M) recommends nutrition derived mostly from Whole Food, Plant Predominant Sources example an apple instead of applesauce or apple pie. Eat Plenty of vegetables, Mushrooms, fruits, Legumes, Whole Grains, Nuts, seeds in lieu of processed meats, processed snacks/pastries red meat, poultry, eggs.  Use only water or unsweetened tea for hydration.  The College also recommends the need to stay away from risky substances including alcohol, smoking; obtaining 7-9 hours  of restorative sleep, at least 150 minutes of moderate intensity exercise weekly, importance of healthy social connections, and being mindful of stress and seek help when it is overwhelming.     -Sticking to a routine mealtime to eat 3 meals a day and avoiding unnecessary snacks is shown to have a big role in weight control. Under normal circumstances, the only time we burn stored energy is when we are hungry, so allow  some hunger to take place- hunger means no food between appropriate meal times, only water.  It is not advisable to starve.    -It is better to avoid simple carbohydrates including: Cakes, Sweet Desserts, Ice Cream, Soda (diet and regular), Sweet Tea, Candies, Chips, Cookies, Store Bought Juices, Alcohol in Excess of  1-2 drinks a day, Lemonade,  Artificial Sweeteners, Doughnuts, Coffee Creamers, "Sugar-free" Products, etc, etc.  This is not a complete list...Marland Kitchen.    -Consulting with certified diabetes educators is proven to provide you with the most accurate and current information on diet.  Also, you may be  interested in discussing diet options/exchanges , we can schedule a visit with Norm Salt, RDN, CDE for individualized nutrition education.   -Exercise: If you are able: 30 -60 minutes a day ,4 days a week, or 150 minutes of moderate intensity exercise weekly.    The longer the better if tolerated.  Combine stretch, strength, and aerobic activities.  If you were told in the past that you have high risk for cardiovascular diseases, or if you are currently symptomatic, you may seek evaluation by your heart doctor prior to initiating moderate to intense exercise programs.  Additional Care Considerations for Diabetes/Prediabetes     -Diabetes  is a chronic disease.  The most important care consideration is regular follow-up with your diabetes care provider with the goal being avoiding or delaying its complications and to take advantage of advances  in medications and technology.  If appropriate actions are taken early enough, type 2 diabetes can even be reversed.  Seek information from the right source.   - Whole Food, Plant Predominant Nutrition is highly recommended: Eat Plenty of vegetables, Mushrooms, fruits, Legumes, Whole Grains, Nuts, seeds in lieu of processed meats, processed snacks/pastries red meat, poultry, eggs as recommended by [employer] of  Lifestyle Medicine (ACLM).   -Type 2 diabetes is known to coexist with other important comorbidities such as high blood pressure and high cholesterol.  It is critical to control not only the diabetes but also the high blood pressure and high cholesterol to minimize and delay the risk of complications including coronary artery disease, stroke, amputations, blindness, etc.  The good news is that this diet recommendation for type 2 diabetes is also very helpful for managing high cholesterol and high blood blood pressure.   - Studies showed that people with diabetes will benefit from a class of medications known as ACE inhibitors and statins.  Unless there are specific reasons not to be on these medications, the standard of care is to consider getting one from these groups of medications at an optimal doses.  These medications are generally considered safe and proven to help protect the heart and the kidneys.     - People with diabetes are encouraged to initiate and maintain regular follow-up with eye doctors, foot doctors, dentists , and if necessary heart and kidney doctors.      - It is highly recommended that people with diabetes quit smoking or stay away from smoking, and get yearly  flu vaccine and pneumonia vaccine at least every 5 years.  See above for additional recommendations on exercise, sleep, stress management , and healthy social connections.

## 2023-01-26 NOTE — Assessment & Plan Note (Addendum)
Patient reports CBGs 105-120 at home. CBG at today's visit 156. He will continue to monitor CBGs at home. Plan to obtain A1C today based on today's CBG and previous diagnosis of prediabetes.

## 2023-01-26 NOTE — Assessment & Plan Note (Signed)
BP 96/60 today. Patient does not monitor BP at home. He says he does not have a meter. Device not covered by insurance. He denies dizziness, headache, palpitations, and weakness. Patient will continue medications as previously prescribed.

## 2023-01-26 NOTE — Assessment & Plan Note (Signed)
Referral made to cardiology for follow up heart failure

## 2023-01-27 ENCOUNTER — Telehealth: Payer: Self-pay

## 2023-01-27 NOTE — Telephone Encounter (Signed)
Called patient unable to make contact or leave voicemail, information sent to the nurse pool

## 2023-01-27 NOTE — Progress Notes (Signed)
Let patient know hemoglobin A1c at goal

## 2023-01-27 NOTE — Telephone Encounter (Signed)
-----   Message from Shan Levans sent at 01/27/2023 12:25 PM EDT ----- Let patient know hemoglobin A1c at goal

## 2023-02-03 ENCOUNTER — Encounter: Payer: Self-pay | Admitting: Cardiovascular Disease

## 2023-02-05 ENCOUNTER — Other Ambulatory Visit (HOSPITAL_COMMUNITY): Payer: Self-pay

## 2023-02-16 ENCOUNTER — Other Ambulatory Visit (HOSPITAL_COMMUNITY): Payer: Self-pay

## 2023-02-16 ENCOUNTER — Other Ambulatory Visit: Payer: Self-pay

## 2023-02-18 ENCOUNTER — Other Ambulatory Visit (HOSPITAL_COMMUNITY): Payer: Self-pay

## 2023-02-19 ENCOUNTER — Telehealth (HOSPITAL_COMMUNITY): Payer: Self-pay | Admitting: Cardiology

## 2023-02-25 ENCOUNTER — Other Ambulatory Visit (HOSPITAL_COMMUNITY): Payer: Self-pay

## 2023-02-25 ENCOUNTER — Other Ambulatory Visit: Payer: Self-pay

## 2023-02-25 ENCOUNTER — Encounter: Payer: Self-pay | Admitting: Critical Care Medicine

## 2023-03-01 ENCOUNTER — Other Ambulatory Visit (HOSPITAL_COMMUNITY): Payer: Self-pay

## 2023-03-08 ENCOUNTER — Telehealth: Payer: Self-pay

## 2023-03-08 NOTE — Telephone Encounter (Signed)
Called patient and left voicemail regarding mychart message

## 2023-03-09 NOTE — Telephone Encounter (Signed)
Pt returned call. States he is not on any diabetic medications. States he will go to [employer], information provided.

## 2023-03-10 ENCOUNTER — Encounter: Payer: Self-pay | Admitting: Physician Assistant

## 2023-03-10 ENCOUNTER — Ambulatory Visit: Payer: Commercial Managed Care - PPO | Admitting: Physician Assistant

## 2023-03-10 ENCOUNTER — Other Ambulatory Visit (HOSPITAL_COMMUNITY): Payer: Self-pay

## 2023-03-10 VITALS — BP 91/54 | HR 79 | Ht 66.0 in | Wt 255.0 lb

## 2023-03-10 DIAGNOSIS — N182 Chronic kidney disease, stage 2 (mild): Secondary | ICD-10-CM

## 2023-03-10 DIAGNOSIS — E1165 Type 2 diabetes mellitus with hyperglycemia: Secondary | ICD-10-CM | POA: Insufficient documentation

## 2023-03-10 DIAGNOSIS — E119 Type 2 diabetes mellitus without complications: Secondary | ICD-10-CM

## 2023-03-10 DIAGNOSIS — I10 Essential (primary) hypertension: Secondary | ICD-10-CM

## 2023-03-10 DIAGNOSIS — Z6841 Body Mass Index (BMI) 40.0 and over, adult: Secondary | ICD-10-CM

## 2023-03-10 DIAGNOSIS — E782 Mixed hyperlipidemia: Secondary | ICD-10-CM | POA: Diagnosis not present

## 2023-03-10 DIAGNOSIS — Z7984 Long term (current) use of oral hypoglycemic drugs: Secondary | ICD-10-CM

## 2023-03-10 DIAGNOSIS — D509 Iron deficiency anemia, unspecified: Secondary | ICD-10-CM

## 2023-03-10 DIAGNOSIS — E66813 Obesity, class 3: Secondary | ICD-10-CM

## 2023-03-10 DIAGNOSIS — D649 Anemia, unspecified: Secondary | ICD-10-CM

## 2023-03-10 MED ORDER — BLOOD PRESSURE MONITOR MISC: 1.0000 | Freq: Every day | 0 refills | Status: DC

## 2023-03-10 MED ORDER — TRUE METRIX BLOOD GLUCOSE TEST VI STRP
1.0000 | ORAL_STRIP | Freq: Two times a day (BID) | 12 refills | Status: DC
Start: 2023-03-10 — End: 2023-09-09
  Filled 2023-03-10: qty 100, 50d supply, fill #0

## 2023-03-10 MED ORDER — GLIPIZIDE 5 MG PO TABS
2.5000 mg | ORAL_TABLET | Freq: Every day | ORAL | 1 refills | Status: DC
Start: 2023-03-10 — End: 2023-05-20
  Filled 2023-03-10 – 2023-03-12 (×2): qty 15, 30d supply, fill #0
  Filled 2023-03-25 – 2023-03-29 (×2): qty 15, 30d supply, fill #1

## 2023-03-10 NOTE — Progress Notes (Signed)
Established Patient Office Visit  Subjective   Patient ID: Derek Reed, male    DOB: 08-30-1985  Age: 37 y.o. MRN: [id number]  Chief Complaint  Patient presents with   Diabetes Management Plan    A1c collected 1 month ago with a 6.9, and random blood sugar at 154, Patient states he has not prescribed medication for diabetes.  Bp is low this morning patient states he is feeling well no signs of lightheaded or feeling foggy     States that he is concerned about his blood glucose levels, states that he has had some elevated blood glucose readings, 109, 125, while fasting in the morning.  States that his last A1c was 6.9 approximately 1 month ago.  States that he is not currently taking any medications for blood glucose.  States that he was previously prescribed glipizide 5 mg twice a day in February but discontinued this on his own, states that he did not like the way he felt.  States that he did not check his blood glucose levels when he was feeling "off balance"  States that he does not check his blood pressure at home, states that he does have a follow-up appointment with cardiology next month.  Does endorse that even after stopping glipizide he does still have episodes of "feeling off".  States that he is not sure if it is from feeling anxious or if it is medical related.       Past Medical History:  Diagnosis Date   Acute CHF (congestive heart failure) (HCC) 02/24/2019   Anxiety    Diabetes mellitus without complication (HCC)    Based on A1C. Pt diet controlled.   Hypertension    Obesity    Peritonsillar abscess    Social History   Socioeconomic History   Marital status: Married    Spouse name: Not on file   Number of children: Not on file   Years of education: Not on file   Highest education level: Not on file  Occupational History   Not on file  Tobacco Use   Smoking status: Former   Smokeless tobacco: Never  Vaping Use   Vaping status: Never Used  Substance  and Sexual Activity   Alcohol use: No   Drug use: No   Sexual activity: Not on file  Other Topics Concern   Not on file  Social History Narrative   Not on file   Social Determinants of Health   Financial Resource Strain: Low Risk  (07/31/2022)   Overall Financial Resource Strain (CARDIA)    Difficulty of Paying Living Expenses: Not very hard  Food Insecurity: No Food Insecurity (07/31/2022)   Hunger Vital Sign    Worried About Running Out of Food in the Last Year: Never true    Ran Out of Food in the Last Year: Never true  Transportation Needs: No Transportation Needs (07/31/2022)   PRAPARE - Administrator, Civil Service (Medical): No    Lack of Transportation (Non-Medical): No  Physical Activity: Insufficiently Active (07/31/2022)   Exercise Vital Sign    Days of Exercise per Week: 2 days    Minutes of Exercise per Session: 60 min  Stress: Stress Concern Present (07/31/2022)   [employer] of Occupational Health - Occupational Stress Questionnaire    Feeling of Stress : To some extent  Social Connections: Socially Integrated (07/31/2022)   Social Connection and Isolation Panel [NHANES]    Frequency of Communication with Friends and Family: More than  three times a week    Frequency of Social Gatherings with Friends and Family: More than three times a week    Attends Religious Services: More than 4 times per year    Active Member of [employer] or Organizations: Yes    Attends Banker Meetings: More than 4 times per year    Marital Status: Married  Catering manager Violence: Unknown (07/31/2022)   Humiliation, Afraid, Rape, and Kick questionnaire    Fear of Current or Ex-Partner: No    Emotionally Abused: Not on file    Physically Abused: No    Sexually Abused: No   Family History  Problem Relation Age of Onset   Hyperlipidemia Mother    Diabetes Mother    Stroke Mother    Hypertension Mother    Hypertension Father    Allergies  Allergen Reactions    Egg-Derived Products Nausea And Vomiting    Review of Systems  Constitutional: Negative.   HENT: Negative.    Eyes: Negative.   Respiratory:  Negative for shortness of breath.   Cardiovascular:  Negative for chest pain.  Gastrointestinal: Negative.   Genitourinary: Negative.   Musculoskeletal: Negative.   Skin: Negative.   Neurological:  Negative for dizziness, loss of consciousness and weakness.  Endo/Heme/Allergies: Negative.   Psychiatric/Behavioral: Negative.        Objective:     BP (!) 91/54 (BP Location: Left Arm, Patient Position: Sitting, Cuff Size: Large)   Pulse 79   Ht 5\' 6"  (1.676 m)   Wt 255 lb (115.7 kg)   SpO2 96%   BMI 41.16 kg/m  BP Readings from Last 3 Encounters:  03/10/23 (!) 91/54  01/26/23 96/60  01/15/23 110/64   Wt Readings from Last 3 Encounters:  03/10/23 255 lb (115.7 kg)  01/26/23 254 lb 6.4 oz (115.4 kg)  01/15/23 249 lb 1.9 oz (113 kg)    Physical Exam Vitals and nursing note reviewed.  Constitutional:      Appearance: Normal appearance.  HENT:     Head: Normocephalic and atraumatic.     Right Ear: External ear normal.     Left Ear: External ear normal.     Nose: Nose normal.     Mouth/Throat:     Mouth: Mucous membranes are moist.     Pharynx: Oropharynx is clear.  Eyes:     Extraocular Movements: Extraocular movements intact.     Conjunctiva/sclera: Conjunctivae normal.     Pupils: Pupils are equal, round, and reactive to light.  Cardiovascular:     Rate and Rhythm: Normal rate and regular rhythm.     Pulses: Normal pulses.     Heart sounds: Normal heart sounds.  Pulmonary:     Effort: Pulmonary effort is normal.     Breath sounds: Normal breath sounds.  Musculoskeletal:        General: Normal range of motion.     Cervical back: Normal range of motion and neck supple.  Skin:    General: Skin is warm and dry.  Neurological:     General: No focal deficit present.     Mental Status: He is alert and oriented to  person, place, and time.  Psychiatric:        Mood and Affect: Mood normal.        Behavior: Behavior normal.        Thought Content: Thought content normal.        Judgment: Judgment normal.  Assessment & Plan:   Problem List Items Addressed This Visit       Cardiovascular and Mediastinum   Essential hypertension   Relevant Medications   Blood Pressure Monitor MISC     Endocrine   Type 2 diabetes mellitus with hyperglycemia, without long-term current use of insulin (HCC) - Primary   Relevant Medications   glipiZIDE 2.5 MG TABS   glucose blood (TRUE METRIX BLOOD GLUCOSE TEST) test strip   Other Relevant Orders   Comp. Metabolic Panel (12)   Microalbumin / creatinine urine ratio     Genitourinary   CKD (chronic kidney disease), stage II   Relevant Orders   Comp. Metabolic Panel (12)     Other   Mixed hyperlipidemia   Relevant Orders   Lipid panel   Other Visit Diagnoses     Anemia, unspecified type       Relevant Orders   CBC with Differential/Platelet   Iron, TIBC and Ferritin Panel   Class 3 severe obesity due to excess calories with serious comorbidity and body mass index (BMI) of 40.0 to 44.9 in adult [facility])       Relevant Medications   glipiZIDE 2.5 MG TABS   Diabetes mellitus treated with oral medication (HCC)       Relevant Medications   glipiZIDE 2.5 MG TABS      1. Type 2 diabetes mellitus with hyperglycemia, without long-term current use of insulin (HCC) Agreeable to restart glipizide at lower dose.  Patient education given on heart healthy low sugar diet.  Patient encouraged to check blood glucose levels at home, keep a written log and have available for all office visits.  Red flags given for prompt reevaluation - Comp. Metabolic Panel (12) - Microalbumin / creatinine urine ratio - glipiZIDE 2.5 MG TABS; Take 2.5 mg by mouth daily before breakfast.  Dispense: 30 tablet; Refill: 1 - glucose blood (TRUE METRIX BLOOD GLUCOSE TEST) test strip;  1 each by Other route 2 (two) times daily. Use as instructed  Dispense: 100 each; Refill: 12  2. Essential hypertension Patient encouraged to check blood pressure at home, keep a written log and have available for all office visits.  Patient encouraged to keep follow-up with cardiology. - Blood Pressure Monitor MISC; Use to check blood pressure daily  Dispense: 1 each; Refill: 0  3. Mixed hyperlipidemia Patient did eat peanut butter crackers approximately 2 hours ago. - Lipid panel  4. CKD (chronic kidney disease), stage II  - Comp. Metabolic Panel (12)  5. Anemia, unspecified type  - CBC with Differential/Platelet - Iron, TIBC and Ferritin Panel  6. Class 3 severe obesity due to excess calories with serious comorbidity and body mass index (BMI) of 40.0 to 44.9 in adult (HCC)   7. Diabetes mellitus treated with oral medication (HCC)    I have reviewed the patient's medical history (PMH, PSH, Social History, Family History, Medications, and allergies) , and have been updated if relevant. I spent 30 minutes reviewing chart and  face to face time with patient.    Return if symptoms worsen or fail to improve.    Kasandra Knudsen Mayers, PA-C

## 2023-03-10 NOTE — Patient Instructions (Addendum)
To help with lower your blood sugar levels, you are going to restart glipizide, but take it at a lower dose.  You are going to take 2.5 mg once daily with breakfast.  I encourage you to check your blood sugar levels at home, keep a written log and have available for all office visits.  Your blood pressure is on the lower end of normal today, I do encourage you to check your blood pressure at home and when you are feeling poorly, keep a written log and have available for all office visits.  We will call you with today's lab results.  Roney Jaffe, PA-C Physician Assistant [facility] Medicine [email or web address]   Hypotension As the heart beats, it forces blood through the body. Hypotension, commonly called low blood pressure, is when the force of blood pumping through the arteries is too weak. Arteries are blood vessels that carry blood from the heart throughout the body. Depending on the cause and severity, hypotension may be harmless (benign) or may cause serious problems (be critical). When your blood pressure is too low, you may not get enough blood to your brain or to the rest of your organs. This can cause weakness, light-headedness, a rapid heartbeat, and fainting. What are the causes? This condition may be caused by: Blood loss. Loss of body fluids (dehydration). Heart problems. Hormone (endocrine) problems. Pregnancy. Severe infection. Lack of certain nutrients. Severe allergic reactions (anaphylaxis). Certain medicines, such as blood pressure medicine or medicines that make the body lose excess fluids (diuretics). Sometimes, hypotension may be caused by not taking medicine as directed, such as taking too much of a certain medicine. What increases the risk? The following factors may make you more likely to develop this condition: Age. Risk increases as you get older. Having a condition that affects the heart or the central nervous  system. What are the signs or symptoms? Common symptoms of this condition include: Weakness. Light-headedness. Dizziness. Blurred vision. Tiredness (fatigue). Rapid heartbeat. Fainting, in severe cases. How is this diagnosed? This condition is diagnosed based on: Your medical history. Your symptoms. Your blood pressure measurement. Your health care provider will check your blood pressure when you are: Lying down. Sitting. Standing. A blood pressure reading is recorded as two numbers, such as "120 over 80" (or 120/80). The first ("top") number is called the systolic pressure. It is a measure of the pressure in your arteries as your heart beats. The second ("bottom") number is called the diastolic pressure. It is a measure of the pressure in your arteries when your heart relaxes between beats. Blood pressure is measured in a unit called mm Hg. Healthy blood pressure for most adults is 120/80. If your blood pressure is below 90/60, you may be diagnosed with hypotension. Other information or tests that may be used to diagnose hypotension include: Your other vital signs, such as your heart rate and temperature. Blood tests. Tilt table test. For this test, you will be safely secured to a table that moves you from a lying position to an upright position. Your heart rhythm and blood pressure will be monitored during the test. How is this treated? Treatment for this condition may include: Changing your diet. This may involve drinking more water or increasing your salt (sodium) intake with high-sodium foods. Taking medicines to raise your blood pressure. Changing the dosage of certain medicines you are taking that might be lowering your blood pressure. Wearing compression stockings. These stockings help to prevent blood clots and reduce swelling  in your legs. In some cases, you may need to go to the hospital for: Fluid replacement. This means you will receive fluids through an IV. Blood  replacement. This means you will receive donated blood through an IV (transfusion). Treating an infection or heart problems, if this applies. Monitoring. You may need to be monitored while medicines that you are taking wear off. Follow these instructions at home: Eating and drinking  Drink enough fluid to keep your urine pale yellow. Eat a healthy diet, and follow instructions from your health care provider about eating or drinking restrictions. A healthy diet includes: Fresh fruits and vegetables. Whole grains. Lean meats. Low-fat dairy products. Increase your salt intake if told to do so. Do not add extra salt to your diet unless your health care provider tells you to do that. Eat frequent, small meals. Avoid standing up suddenly after eating. Medicines Take over-the-counter and prescription medicines only as told by your health care provider. Follow instructions from your health care provider about changing the dosage of your current medicines, if this applies. Do not stop or adjust any of your medicines on your own. General instructions  Wear compression stockings as told by your health care provider. Get up slowly from lying down or sitting positions. This gives your blood pressure a chance to adjust. Avoid hot showers and excessive heat as directed by your health care provider. Return to your normal activities as told by your health care provider. Ask your health care provider what activities are safe for you. Do not use any products that contain nicotine or tobacco. These products include cigarettes, chewing tobacco, and vaping devices, such as e-cigarettes. If you need help quitting, ask your health care provider. Keep all follow-up visits. This is important. Contact a health care provider if: You vomit. You have diarrhea. You have a fever for more than 2-3 days. You feel more thirsty than usual. You feel weak and tired. Get help right away if: You have chest pain. You have  a fast or irregular heartbeat. You develop numbness in any part of your body. You cannot move your arms or your legs. You have trouble speaking. You become sweaty or feel light-headed. You faint. You feel short of breath. You have trouble staying awake. You feel confused. These symptoms may be an emergency. Get help right away. Call 911. Do not wait to see if the symptoms will go away. Do not drive yourself to the hospital. Summary Hypotension is when the force of blood pumping through the arteries is too weak. Hypotension may be harmless (benign) or may cause serious problems (be critical). Treatment for this condition may include changing your diet, changing your medicines, and wearing compression stockings. In some cases, you may need to go to the hospital for fluid or blood replacement. This information is not intended to replace advice given to you by your health care provider. Make sure you discuss any questions you have with your health care provider. Document Revised: 12/16/2020 Document Reviewed: 12/16/2020 Elsevier Patient Education  2024 [employer].

## 2023-03-11 ENCOUNTER — Other Ambulatory Visit (HOSPITAL_COMMUNITY): Payer: Self-pay

## 2023-03-11 LAB — LIPID PANEL
Chol/HDL Ratio: 3.6 ratio (ref 0.0–5.0)
Cholesterol, Total: 93 mg/dL — ABNORMAL LOW (ref 100–199)
HDL: 26 mg/dL — ABNORMAL LOW (ref >39)
LDL Chol Calc (NIH): 45 mg/dL (ref 0–99)
Triglycerides: 118 mg/dL (ref 0–149)
VLDL Cholesterol Cal: 22 mg/dL (ref 5–40)

## 2023-03-11 LAB — CBC WITH DIFFERENTIAL/PLATELET
Basophils Absolute: 0 10*3/uL (ref 0.0–0.2)
Basos: 0 %
EOS (ABSOLUTE): 0.2 10*3/uL (ref 0.0–0.4)
Eos: 2 %
Hematocrit: 36.3 % — ABNORMAL LOW (ref 37.5–51.0)
Hemoglobin: 11.1 g/dL — ABNORMAL LOW (ref 13.0–17.7)
Immature Grans (Abs): 0 10*3/uL (ref 0.0–0.1)
Immature Granulocytes: 0 %
Lymphocytes Absolute: 2.6 10*3/uL (ref 0.7–3.1)
Lymphs: 27 %
MCH: 23.3 pg — ABNORMAL LOW (ref 26.6–33.0)
MCHC: 30.6 g/dL — ABNORMAL LOW (ref 31.5–35.7)
MCV: 76 fL — ABNORMAL LOW (ref 79–97)
Monocytes Absolute: 0.9 10*3/uL (ref 0.1–0.9)
Monocytes: 9 %
Neutrophils Absolute: 6 10*3/uL (ref 1.4–7.0)
Neutrophils: 62 %
Platelets: 260 10*3/uL (ref 150–450)
RBC: 4.76 x10E6/uL (ref 4.14–5.80)
RDW: 14.9 % (ref 11.6–15.4)
WBC: 9.7 10*3/uL (ref 3.4–10.8)

## 2023-03-11 LAB — COMP. METABOLIC PANEL (12)
AST: 14 [IU]/L (ref 0–40)
Albumin: 4 g/dL — ABNORMAL LOW (ref 4.1–5.1)
Alkaline Phosphatase: 79 [IU]/L (ref 44–121)
BUN/Creatinine Ratio: 11 (ref 9–20)
BUN: 18 mg/dL (ref 6–20)
Bilirubin Total: 0.3 mg/dL (ref 0.0–1.2)
Calcium: 8.9 mg/dL (ref 8.7–10.2)
Chloride: 101 mmol/L (ref 96–106)
Creatinine, Ser: 1.59 mg/dL — ABNORMAL HIGH (ref 0.76–1.27)
Globulin, Total: 2.9 g/dL (ref 1.5–4.5)
Glucose: 106 mg/dL — ABNORMAL HIGH (ref 70–99)
Potassium: 3.3 mmol/L — ABNORMAL LOW (ref 3.5–5.2)
Sodium: 138 mmol/L (ref 134–144)
Total Protein: 6.9 g/dL (ref 6.0–8.5)
eGFR: 56 mL/min/{1.73_m2} — ABNORMAL LOW (ref >59)

## 2023-03-11 LAB — MICROALBUMIN / CREATININE URINE RATIO
Creatinine, Urine: 182.7 mg/dL
Microalb/Creat Ratio: 3 mg/g{creat} (ref 0–29)
Microalbumin, Urine: 6.3 ug/mL

## 2023-03-11 LAB — IRON,TIBC AND FERRITIN PANEL
Ferritin: 151 ng/mL (ref 30–400)
Iron Saturation: 12 % — ABNORMAL LOW (ref 15–55)
Iron: 31 ug/dL — ABNORMAL LOW (ref 38–169)
Total Iron Binding Capacity: 262 ug/dL (ref 250–450)
UIBC: 231 ug/dL (ref 111–343)

## 2023-03-11 MED ORDER — ATORVASTATIN CALCIUM 10 MG PO TABS
5.0000 mg | ORAL_TABLET | Freq: Every day | ORAL | 1 refills | Status: DC
Start: 2023-03-11 — End: 2023-06-17
  Filled 2023-03-11 – 2023-03-25 (×3): qty 45, 90d supply, fill #0
  Filled 2023-06-07: qty 45, 90d supply, fill #1

## 2023-03-11 MED ORDER — IRON (FERROUS SULFATE) 325 (65 FE) MG PO TABS
325.0000 mg | ORAL_TABLET | ORAL | 0 refills | Status: DC
Start: 2023-03-11 — End: 2023-07-10
  Filled 2023-03-11 – 2023-03-25 (×2): qty 45, 90d supply, fill #0

## 2023-03-11 NOTE — Addendum Note (Signed)
Addended by: Roney Jaffe on: 03/11/2023 05:12 PM   Modules accepted: Orders

## 2023-03-12 ENCOUNTER — Other Ambulatory Visit (HOSPITAL_COMMUNITY): Payer: Self-pay

## 2023-03-25 ENCOUNTER — Other Ambulatory Visit (HOSPITAL_COMMUNITY): Payer: Self-pay

## 2023-03-27 ENCOUNTER — Other Ambulatory Visit (HOSPITAL_COMMUNITY): Payer: Self-pay

## 2023-03-29 ENCOUNTER — Encounter: Payer: Self-pay | Admitting: Nurse Practitioner

## 2023-03-29 ENCOUNTER — Other Ambulatory Visit (HOSPITAL_COMMUNITY): Payer: Self-pay

## 2023-03-29 ENCOUNTER — Other Ambulatory Visit: Payer: Self-pay | Admitting: Nurse Practitioner

## 2023-03-29 DIAGNOSIS — E1165 Type 2 diabetes mellitus with hyperglycemia: Secondary | ICD-10-CM

## 2023-03-31 ENCOUNTER — Encounter (HOSPITAL_COMMUNITY): Payer: Commercial Managed Care - PPO

## 2023-04-01 ENCOUNTER — Other Ambulatory Visit (HOSPITAL_COMMUNITY): Payer: Self-pay

## 2023-04-05 ENCOUNTER — Encounter: Payer: Self-pay | Admitting: *Deleted

## 2023-04-05 NOTE — Progress Notes (Signed)
Pt attended cardiometabolic mobile unit clinic on 03/10/23 and saw [facility], PA-C, where his blood pressure was 91/54 and his blood sugar was 106. PA-C Mayers connected pt back to his PCP clinic for follow up with advice by Bertram Denver, NP on 03/29/23. He did miss his cardiology appt on 03/31/23. However, chart review indicates pt saw his original PCP, Dr. Delford Field on 01/26/23 where his A1C was 6.9. Pt has a future appt with NP Meredeth Ide on 04/12/23 Pt did not identify any SDOH insecurities. Call made to remind pt to f/u with cardiologist/heart failure clinic and to keep 04/12/23 PCP appt but unable to reach pt by phone. Letter sent to pt to remind him of these needed appts. No additional health equity team support scheduled at this time since pt has both PCP and specialty access an no current SDOH needs.

## 2023-04-12 ENCOUNTER — Ambulatory Visit: Payer: Commercial Managed Care - PPO | Admitting: Nurse Practitioner

## 2023-04-20 ENCOUNTER — Ambulatory Visit: Payer: Commercial Managed Care - PPO | Admitting: Gastroenterology

## 2023-04-20 NOTE — Progress Notes (Unsigned)
HPI : Derek Reed is a 37 y.o. male with a history of hypertension, diabetes, anxiety and history of nonischemic systolic heart failure, with recovery of systolic function who is referred to [place] by Storm Frisk, MD for further evaluation of iron deficiency anemia.  The patient has had a stable mild anemia with hemoglobin between 10.8 and 12.4 since 2015.  He has a persistent microcytosis, with MCV's in the low to mid 70s which is also been present for the last 10 years.  A recent iron panel was equivocal for iron deficiency, with ferritin 150, iron saturation 12% and serum iron 31.  TIBC not elevated.    Past Medical History:  Diagnosis Date   Acute CHF (congestive heart failure) (HCC) 02/24/2019   Anxiety    CKD (chronic kidney disease) stage 2, GFR 60-89 ml/min    Diabetes mellitus without complication (HCC)    Based on A1C. Pt diet controlled.   Essential hypertension 02/24/2019   GAD (generalized anxiety disorder) 03/26/2014   Hyperlipidemia    Hypertension    Obesity    Peritonsillar abscess      Past Surgical History:  Procedure Laterality Date   CHOLECYSTECTOMY     CHOLECYSTECTOMY N/A 01/15/2023   Procedure: LAPAROSCOPIC CHOLECYSTECTOMY;  Surgeon: Violeta Gelinas, MD;  Location: [facility] OR;  Service: General;  Laterality: N/A;   INCISION AND DRAINAGE OF PERITONSILLAR ABCESS     RIGHT/LEFT HEART CATH AND CORONARY ANGIOGRAPHY N/A 02/24/2019   Procedure: RIGHT/LEFT HEART CATH AND CORONARY ANGIOGRAPHY;  Surgeon: Laurey Morale, MD;  Location: [facility] INVASIVE CV LAB;  Service: Cardiovascular;  Laterality: N/A;   Family History  Problem Relation Age of Onset   Hyperlipidemia Mother    Diabetes Mother    Stroke Mother    Hypertension Mother    Hypertension Father    Social History   Tobacco Use   Smoking status: Former   Smokeless tobacco: Never  Advertising account planner   Vaping status: Never Used  Substance Use Topics   Alcohol use: No   Drug use: No   Current Outpatient  Medications  Medication Sig Dispense Refill   atorvastatin (LIPITOR) 10 MG tablet Take 0.5 tablets (5 mg total) by mouth daily. 45 tablet 1   Blood Pressure Monitor MISC Use to check blood pressure daily 1 each 0   carvedilol (COREG) 12.5 MG tablet Take 1 tablet (12.5 mg total) by mouth 2 (two) times daily with a meal. 60 tablet 6   chlorthalidone (HYGROTON) 25 MG tablet Take 1 tablet (25 mg total) by mouth daily. 30 tablet 6   glipiZIDE (GLUCOTROL) 5 MG tablet Take 0.5 tablets (2.5 mg total) by mouth daily before breakfast. 15 tablet 1   glucose blood (TRUE METRIX BLOOD GLUCOSE TEST) test strip Use to check blood sugar 2 (two) times daily as instructed. 100 each 12   Iron, Ferrous Sulfate, 325 (65 Fe) MG TABS Take 1 tablet by mouth every other day. 45 tablet 0   isosorbide-hydrALAZINE (BIDIL) 20-37.5 MG tablet Take 2 tablets by mouth 2 (two) times daily. 120 tablet 3   nitroGLYCERIN (NITROSTAT) 0.4 MG SL tablet Place 1 tablet (0.4 mg total) under the tongue every 5 (five) minutes as needed for chest pain. 25 tablet 3   potassium chloride SA (KLOR-CON M) 20 MEQ tablet Take 1 tablet (20 mEq total) by mouth 2 (two) times daily. 60 tablet 3   sacubitril-valsartan (ENTRESTO) 97-103 MG Take 1 tablet by mouth 2 (two) times daily. 60 tablet  5   spironolactone (ALDACTONE) 25 MG tablet Take 1 tablet (25 mg total) by mouth daily. 90 tablet 1   No current facility-administered medications for this visit.   Allergies  Allergen Reactions   Egg-Derived Products Nausea And Vomiting     Review of Systems: All systems reviewed and negative except where noted in HPI.    No results found.  Physical Exam: There were no vitals taken for this visit. Constitutional: Pleasant,well-developed, ***male in no acute distress. HEENT: Normocephalic and atraumatic. Conjunctivae are normal. No scleral icterus. Neck supple.  Cardiovascular: Normal rate, regular rhythm.  Pulmonary/chest: Effort normal and breath  sounds normal. No wheezing, rales or rhonchi. Abdominal: Soft, nondistended, nontender. Bowel sounds active throughout. There are no masses palpable. No hepatomegaly. Extremities: no edema Lymphadenopathy: No cervical adenopathy noted. Neurological: Alert and oriented to person place and time. Skin: Skin is warm and dry. No rashes noted. Psychiatric: Normal mood and affect. Behavior is normal.  CBC    Component Value Date/Time   WBC 9.7 03/10/2023 1004   WBC 9.9 01/15/2023 1948   RBC 4.76 03/10/2023 1004   RBC 5.24 01/15/2023 1948   HGB 11.1 (L) 03/10/2023 1004   HCT 36.3 (L) 03/10/2023 1004   PLT 260 03/10/2023 1004   MCV 76 (L) 03/10/2023 1004   MCH 23.3 (L) 03/10/2023 1004   MCH 22.9 (L) 01/15/2023 1948   MCHC 30.6 (L) 03/10/2023 1004   MCHC 32.0 01/15/2023 1948   RDW 14.9 03/10/2023 1004   LYMPHSABS 2.6 03/10/2023 1004   MONOABS 0.5 01/15/2023 1948   EOSABS 0.2 03/10/2023 1004   BASOSABS 0.0 03/10/2023 1004    CMP     Component Value Date/Time   NA 138 03/10/2023 1004   K 3.3 (L) 03/10/2023 1004   CL 101 03/10/2023 1004   CO2 22 01/15/2023 1948   GLUCOSE 106 (H) 03/10/2023 1004   GLUCOSE 139 (H) 01/15/2023 1948   BUN 18 03/10/2023 1004   CREATININE 1.59 (H) 03/10/2023 1004   CREATININE 1.21 03/26/2014 1656   CALCIUM 8.9 03/10/2023 1004   PROT 6.9 03/10/2023 1004   ALBUMIN 4.0 (L) 03/10/2023 1004   AST 14 03/10/2023 1004   ALT 20 11/03/2022 1511   ALKPHOS 79 03/10/2023 1004   BILITOT 0.3 03/10/2023 1004   GFRNONAA 53 (L) 01/15/2023 1948   GFRAA >60 10/30/2019 1356       Latest Ref Rng & Units 03/10/2023   10:04 AM 01/15/2023    7:48 PM 01/07/2023    8:27 AM  CBC EXTENDED  WBC 3.4 - 10.8 x10E3/uL 9.7  9.9  9.0   RBC 4.14 - 5.80 x10E6/uL 4.76  5.24  5.22   Hemoglobin 13.0 - 17.7 g/dL 11.9  14.7  82.9   HCT 37.5 - 51.0 % 36.3  37.5  38.3   Platelets 150 - 450 x10E3/uL 260  276  258   NEUT# 1.4 - 7.0 x10E3/uL 6.0  7.7    Lymph# 0.7 - 3.1 x10E3/uL 2.6  1.7         ASSESSMENT AND PLAN:  Storm Frisk, MD

## 2023-04-26 ENCOUNTER — Other Ambulatory Visit (HOSPITAL_COMMUNITY): Payer: Self-pay

## 2023-04-26 ENCOUNTER — Other Ambulatory Visit: Payer: Self-pay

## 2023-04-28 ENCOUNTER — Other Ambulatory Visit (HOSPITAL_COMMUNITY): Payer: Self-pay

## 2023-04-28 ENCOUNTER — Other Ambulatory Visit: Payer: Self-pay

## 2023-04-28 ENCOUNTER — Encounter: Payer: Self-pay | Admitting: Critical Care Medicine

## 2023-04-28 MED ORDER — ISOSORBIDE DINITRATE 40 MG PO TABS
40.0000 mg | ORAL_TABLET | Freq: Two times a day (BID) | ORAL | 2 refills | Status: DC
  Filled 2023-04-28: qty 60, 30d supply, fill #0
  Filled 2023-06-01: qty 40, 20d supply, fill #1
  Filled 2023-06-02: qty 20, 10d supply, fill #1
  Filled 2023-06-02 – 2023-06-04 (×2): qty 60, 30d supply, fill #1

## 2023-04-28 MED ORDER — HYDRALAZINE HCL 50 MG PO TABS
50.0000 mg | ORAL_TABLET | Freq: Three times a day (TID) | ORAL | 1 refills | Status: DC
  Filled 2023-04-28: qty 90, 30d supply, fill #0
  Filled 2023-06-01: qty 90, 30d supply, fill #1

## 2023-04-29 ENCOUNTER — Other Ambulatory Visit (HOSPITAL_COMMUNITY): Payer: Self-pay

## 2023-04-30 ENCOUNTER — Other Ambulatory Visit (HOSPITAL_COMMUNITY): Payer: Self-pay

## 2023-05-12 ENCOUNTER — Other Ambulatory Visit: Payer: Self-pay | Admitting: Physician Assistant

## 2023-05-12 DIAGNOSIS — E1165 Type 2 diabetes mellitus with hyperglycemia: Secondary | ICD-10-CM

## 2023-05-15 ENCOUNTER — Other Ambulatory Visit (HOSPITAL_COMMUNITY): Payer: Self-pay

## 2023-05-20 ENCOUNTER — Other Ambulatory Visit (HOSPITAL_BASED_OUTPATIENT_CLINIC_OR_DEPARTMENT_OTHER): Payer: Self-pay

## 2023-05-20 ENCOUNTER — Encounter: Payer: Self-pay | Admitting: Critical Care Medicine

## 2023-05-20 ENCOUNTER — Other Ambulatory Visit (HOSPITAL_COMMUNITY): Payer: Self-pay

## 2023-05-20 ENCOUNTER — Other Ambulatory Visit: Payer: Self-pay | Admitting: Family Medicine

## 2023-05-20 DIAGNOSIS — E1165 Type 2 diabetes mellitus with hyperglycemia: Secondary | ICD-10-CM

## 2023-05-20 MED ORDER — GLIPIZIDE 5 MG PO TABS
2.5000 mg | ORAL_TABLET | Freq: Every day | ORAL | 1 refills | Status: DC
  Filled 2023-05-20: qty 15, 30d supply, fill #0
  Filled 2023-06-10: qty 15, 30d supply, fill #1

## 2023-05-27 ENCOUNTER — Other Ambulatory Visit (HOSPITAL_COMMUNITY): Payer: Self-pay

## 2023-05-31 ENCOUNTER — Other Ambulatory Visit (HOSPITAL_COMMUNITY): Payer: Self-pay

## 2023-06-01 ENCOUNTER — Other Ambulatory Visit (HOSPITAL_COMMUNITY): Payer: Self-pay

## 2023-06-02 ENCOUNTER — Other Ambulatory Visit (HOSPITAL_COMMUNITY): Payer: Self-pay

## 2023-06-04 ENCOUNTER — Other Ambulatory Visit (HOSPITAL_COMMUNITY): Payer: Self-pay

## 2023-06-04 ENCOUNTER — Telehealth: Payer: Self-pay

## 2023-06-04 NOTE — Telephone Encounter (Signed)
Copied from CRM [id number]. Topic: Clinical - Prescription Issue >> Jun 04, 2023 11:51 AM Joanette Gula wrote: Daphnie w/ [facility] states that isosorbide dinitrate (ISORDIL) 40 MG tablet is way too expensive for him.  She recommends Isosorbide Mononitrate, that's way more affordable. If any issues please call [phone].

## 2023-06-07 ENCOUNTER — Other Ambulatory Visit: Payer: Self-pay

## 2023-06-07 ENCOUNTER — Other Ambulatory Visit: Payer: Self-pay | Admitting: Physician Assistant

## 2023-06-07 ENCOUNTER — Other Ambulatory Visit (HOSPITAL_COMMUNITY): Payer: Self-pay

## 2023-06-07 DIAGNOSIS — I10 Essential (primary) hypertension: Secondary | ICD-10-CM

## 2023-06-07 MED ORDER — ISOSORBIDE MONONITRATE ER 30 MG PO TB24
30.0000 mg | ORAL_TABLET | Freq: Every day | ORAL | 0 refills | Status: DC
Start: 2023-06-07 — End: 2023-06-29
  Filled 2023-06-07: qty 30, 30d supply, fill #0

## 2023-06-10 ENCOUNTER — Other Ambulatory Visit (HOSPITAL_COMMUNITY): Payer: Self-pay

## 2023-06-11 ENCOUNTER — Other Ambulatory Visit (HOSPITAL_COMMUNITY): Payer: Self-pay

## 2023-06-14 ENCOUNTER — Other Ambulatory Visit (HOSPITAL_COMMUNITY): Payer: Self-pay

## 2023-06-17 ENCOUNTER — Ambulatory Visit: Payer: Managed Care, Other (non HMO) | Attending: Physician Assistant | Admitting: Physician Assistant

## 2023-06-17 ENCOUNTER — Encounter: Payer: Self-pay | Admitting: Physician Assistant

## 2023-06-17 ENCOUNTER — Other Ambulatory Visit (HOSPITAL_COMMUNITY): Payer: Self-pay

## 2023-06-17 VITALS — BP 106/61 | HR 74 | Resp 19 | Ht 66.0 in | Wt 252.4 lb

## 2023-06-17 DIAGNOSIS — I10 Essential (primary) hypertension: Secondary | ICD-10-CM | POA: Diagnosis not present

## 2023-06-17 DIAGNOSIS — Z23 Encounter for immunization: Secondary | ICD-10-CM

## 2023-06-17 DIAGNOSIS — Z7984 Long term (current) use of oral hypoglycemic drugs: Secondary | ICD-10-CM

## 2023-06-17 DIAGNOSIS — E876 Hypokalemia: Secondary | ICD-10-CM

## 2023-06-17 DIAGNOSIS — E1165 Type 2 diabetes mellitus with hyperglycemia: Secondary | ICD-10-CM

## 2023-06-17 DIAGNOSIS — E782 Mixed hyperlipidemia: Secondary | ICD-10-CM

## 2023-06-17 LAB — POCT GLYCOSYLATED HEMOGLOBIN (HGB A1C): HbA1c, POC (controlled diabetic range): 6.6 % (ref 0.0–7.0)

## 2023-06-17 LAB — GLUCOSE, POCT (MANUAL RESULT ENTRY): POC Glucose: 102 mg/dL — AB (ref 70–99)

## 2023-06-17 MED ORDER — GLIPIZIDE 5 MG PO TABS
2.5000 mg | ORAL_TABLET | Freq: Every day | ORAL | 1 refills | Status: DC
  Filled 2023-06-17 – 2023-06-30 (×2): qty 15, 30d supply, fill #0

## 2023-06-17 MED ORDER — POTASSIUM CHLORIDE CRYS ER 20 MEQ PO TBCR
20.0000 meq | EXTENDED_RELEASE_TABLET | Freq: Two times a day (BID) | ORAL | 3 refills | Status: DC
Start: 2023-06-17 — End: 2023-09-14
  Filled 2023-06-17 – 2023-06-30 (×2): qty 60, 30d supply, fill #0
  Filled 2023-08-01: qty 60, 30d supply, fill #1
  Filled 2023-08-27: qty 60, 30d supply, fill #2

## 2023-06-17 MED ORDER — ATORVASTATIN CALCIUM 10 MG PO TABS
5.0000 mg | ORAL_TABLET | Freq: Every day | ORAL | 1 refills | Status: DC
Start: 2023-06-17 — End: 2023-12-23
  Filled 2023-06-17: qty 45, 90d supply, fill #0
  Filled 2023-07-07: qty 15, 30d supply, fill #0
  Filled 2023-08-08: qty 15, 30d supply, fill #1
  Filled 2023-09-15: qty 15, 30d supply, fill #2
  Filled 2023-10-27: qty 15, 30d supply, fill #3
  Filled 2023-11-28: qty 15, 30d supply, fill #4

## 2023-06-17 NOTE — Progress Notes (Signed)
 Patient ID: Donavyn Fecher, male   DOB: March 18, 1986, 38 y.o.   MRN: [id number]   Lonzy Mato, is a 38 y.o. male  RDW:[id number]  FMW:[id number]  DOB - 1985/09/09  Chief Complaint  Patient presents with   Medical Management of Chronic Issues       Subjective:   Coreon Ivey is a 39 y.o. male here today for med RF and flu shot.  He has an appt with cardiology 06/29/2023 and says they will do labs.  Denies CP/sob/dizziness/HA/edema.    For flu shot-He just vomits eggs if he eats them but he eats many products that contain eggs without any problem daily.  Has never had rash/angioedema, etc.    Recently started on glipizide  for A1C=6.9.  A1C is 6.6 today  No problems updated.  ALLERGIES: Allergies  Allergen Reactions   Egg-Derived Products Nausea And Vomiting    He just vomits eggs if he eats them but he eats many products that contain eggs without any problem 06/17/2023    PAST MEDICAL HISTORY: Past Medical History:  Diagnosis Date   Acute CHF (congestive heart failure) (HCC) 02/24/2019   Anxiety    CKD (chronic kidney disease) stage 2, GFR 60-89 ml/min    Diabetes mellitus without complication (HCC)    Based on A1C. Pt diet controlled.   Essential hypertension 02/24/2019   GAD (generalized anxiety disorder) 03/26/2014   Hyperlipidemia    Hypertension    Obesity    Peritonsillar abscess     MEDICATIONS AT HOME: Prior to Admission medications   Medication Sig Start Date End Date Taking? Authorizing Provider  Blood Pressure Monitor MISC Use to check blood pressure daily 03/10/23  Yes Mayers, Cari S, PA-C  carvedilol  (COREG ) 12.5 MG tablet Take 1 tablet (12.5 mg total) by mouth 2 (two) times daily with a meal. 01/26/23  Yes Brien Belvie BRAVO, MD  chlorthalidone  (HYGROTON ) 25 MG tablet Take 1 tablet (25 mg total) by mouth daily. 01/26/23  Yes Brien Belvie BRAVO, MD  glucose blood (TRUE METRIX BLOOD GLUCOSE TEST) test strip Use to check blood sugar 2 (two) times daily as  instructed. 03/10/23  Yes Mayers, Cari S, PA-C  hydrALAZINE  (APRESOLINE ) 50 MG tablet Take 1 tablet (50 mg total) by mouth 3 (three) times daily. 04/28/23  Yes Brien Belvie BRAVO, MD  Iron , Ferrous Sulfate , 325 (65 Fe) MG TABS Take 1 tablet by mouth every other day. 03/11/23  Yes Mayers, Cari S, PA-C  nitroGLYCERIN  (NITROSTAT ) 0.4 MG SL tablet Place 1 tablet (0.4 mg total) under the tongue every 5 (five) minutes as needed for chest pain. 12/11/22  Yes Jerilynn Lamarr HERO, NP  sacubitril -valsartan  (ENTRESTO ) 97-103 MG Take 1 tablet by mouth 2 (two) times daily. 01/26/23  Yes Brien Belvie BRAVO, MD  spironolactone  (ALDACTONE ) 25 MG tablet Take 1 tablet (25 mg total) by mouth daily. 01/26/23  Yes Brien Belvie BRAVO, MD  atorvastatin  (LIPITOR) 10 MG tablet Take 0.5 tablets (5 mg total) by mouth daily. 06/17/23   Danton Jon HERO, PA-C  glipiZIDE  (GLUCOTROL ) 5 MG tablet Take 0.5 tablets (2.5 mg total) by mouth daily before breakfast. 06/17/23   Danton Jon HERO, PA-C  isosorbide  mononitrate (IMDUR ) 30 MG 24 hr tablet Take 1 tablet (30 mg total) by mouth daily. OFFICE VISIT REQ FOR REFILLS Patient not taking: Reported on 06/17/2023 06/07/23   Mayers, Cari S, PA-C  potassium chloride  SA (KLOR-CON  M) 20 MEQ tablet Take 1 tablet (20 mEq total) by mouth 2 (two) times daily. 06/17/23  Nollie Terlizzi M, PA-C    ROS: Neg HEENT Neg resp Neg cardiac Neg GI Neg GU Neg MS Neg psych Neg neuro  Objective:   Vitals:   06/17/23 1025  BP: 106/61  Pulse: 74  Resp: 19  SpO2: 100%  Weight: 252 lb 6.4 oz (114.5 kg)  Height: 5' 6 (1.676 m)   Exam General appearance : Awake, alert, not in any distress. Speech Clear. Not toxic looking HEENT: Atraumatic and Normocephalic Neck: Supple, no JVD. No cervical lymphadenopathy.  Chest: Good air entry bilaterally, CTAB.  No rales/rhonchi/wheezing CVS: S1 S2 regular, no murmurs.  Neurology: Awake alert, and oriented X 3, CN II-XII intact, Non focal Skin: No Rash  Data  Review Lab Results  Component Value Date   HGBA1C 6.6 06/17/2023   HGBA1C 6.9 (H) 01/26/2023   HGBA1C 6.9 (H) 01/07/2023    Assessment & Plan   1. Type 2 diabetes mellitus with hyperglycemia, without long-term current use of insulin  (HCC) (Primary) improved - Glucose (CBG) - HgB A1c - glipiZIDE  (GLUCOTROL ) 5 MG tablet; Take 0.5 tablets (2.5 mg total) by mouth daily before breakfast.  Dispense: 15 tablet; Refill: 1  2. Long term current use of oral hypoglycemic drug Recently started glipizide   3. Hypertension, unspecified type Managed by cardiology - potassium chloride  SA (KLOR-CON  M) 20 MEQ tablet; Take 1 tablet (20 mEq total) by mouth 2 (two) times daily.  Dispense: 60 tablet; Refill: 3  4. Mixed hyperlipidemia - atorvastatin  (LIPITOR) 10 MG tablet; Take 0.5 tablets (5 mg total) by mouth daily.  Dispense: 45 tablet; Refill: 1  5. Hypokalemia replacement - potassium chloride  SA (KLOR-CON  M) 20 MEQ tablet; Take 1 tablet (20 mEq total) by mouth 2 (two) times daily.  Dispense: 60 tablet; Refill: 3  6. Needs flu shot Flu shot given He just vomits eggs if he eats them but he eats many products that contain eggs without any problem 06/17/2023     Return in about 3 months (around 09/14/2023) for PCP for chronic conditions-please assign to one of our PCP here.  The patient was given clear instructions to go to ER or return to medical center if symptoms don't improve, worsen or new problems develop. The patient verbalized understanding. The patient was told to call to get lab results if they haven't heard anything in the next week.      Jon Moores, PA-C [facility] [place], [place] [phone]   06/17/2023, 11:21 AM

## 2023-06-23 ENCOUNTER — Other Ambulatory Visit (HOSPITAL_COMMUNITY): Payer: Self-pay

## 2023-06-26 ENCOUNTER — Other Ambulatory Visit (HOSPITAL_COMMUNITY): Payer: Self-pay

## 2023-06-28 ENCOUNTER — Other Ambulatory Visit (HOSPITAL_COMMUNITY): Payer: Self-pay

## 2023-06-29 ENCOUNTER — Telehealth: Payer: Self-pay | Admitting: Pharmacist Clinician (PhC)/ Clinical Pharmacy Specialist

## 2023-06-29 ENCOUNTER — Other Ambulatory Visit (HOSPITAL_COMMUNITY): Payer: Self-pay

## 2023-06-29 ENCOUNTER — Telehealth: Payer: Self-pay | Admitting: Pharmacy Technician

## 2023-06-29 ENCOUNTER — Encounter: Payer: Self-pay | Admitting: Cardiovascular Disease

## 2023-06-29 ENCOUNTER — Ambulatory Visit: Payer: Managed Care, Other (non HMO) | Attending: Cardiovascular Disease | Admitting: Cardiovascular Disease

## 2023-06-29 DIAGNOSIS — N1831 Chronic kidney disease, stage 3a: Secondary | ICD-10-CM

## 2023-06-29 DIAGNOSIS — I5032 Chronic diastolic (congestive) heart failure: Secondary | ICD-10-CM

## 2023-06-29 DIAGNOSIS — I10 Essential (primary) hypertension: Secondary | ICD-10-CM

## 2023-06-29 DIAGNOSIS — E785 Hyperlipidemia, unspecified: Secondary | ICD-10-CM

## 2023-06-29 DIAGNOSIS — N522 Drug-induced erectile dysfunction: Secondary | ICD-10-CM

## 2023-06-29 DIAGNOSIS — E1165 Type 2 diabetes mellitus with hyperglycemia: Secondary | ICD-10-CM

## 2023-06-29 NOTE — Telephone Encounter (Signed)
Could you please do PA for [facility].  Pt with A1c of 6.9.

## 2023-06-29 NOTE — Patient Instructions (Signed)
Medication Instructions:  - STOP isosorbide mononitrate (IMDUR) 30 MG 24 hr tablet    *If you need a refill on your cardiac medications before your next appointment, please call your pharmacy*   Lab Work: NONE    If you have labs (blood work) drawn today and your tests are completely normal, you will receive your results only by: MyChart Message (if you have MyChart) OR A paper copy in the mail If you have any lab test that is abnormal or we need to change your treatment, we will call you to review the results.   Testing/Procedures: NONE    Follow-Up: At [facility] you and your health needs are our priority.  As part of our continuing mission to provide you with exceptional heart care, we have created designated Provider Care Teams.  These Care Teams include your primary Cardiologist (physician) and Advanced Practice Providers (APPs -  Physician Assistants and Nurse Practitioners) who all work together to provide you with the care you need, when you need it.  We recommend signing up for the patient portal called "MyChart".  Sign up information is provided on this After Visit Summary.  MyChart is used to connect with patients for Virtual Visits (Telemedicine).  Patients are able to view lab/test results, encounter notes, upcoming appointments, etc.  Non-urgent messages can be sent to your provider as well.   To learn more about what you can do with MyChart, go to [email or web address].    Your next appointment:   1 year(s)  The format for your next appointment:   In Person  Provider:   Thurmon Fair, MD    Other Instructions  Dr. Royann Shivers has referred you to our pharmacy for weightloss management with [facility]

## 2023-06-29 NOTE — Telephone Encounter (Signed)
Pharmacy Patient Advocate Encounter   Received notification from Pt Calls Messages that prior authorization for mounjaro is required/requested.   Insurance verification completed.   The patient is insured through [facility] .   Per test claim: PA required; PA submitted to above mentioned insurance via CoverMyMeds Key/confirmation #/EOC [id number] Status is pending

## 2023-06-29 NOTE — Progress Notes (Unsigned)
Cardiology Office Note:    Date:  06/30/2023   ID:  Derek Reed, DOB 1985-05-22, MRN [id number]  PCP:  Storm Frisk, MD   [facility] Providers Cardiologist:  Golden Circle, MD Click to update primary MD,subspecialty MD or APP then REFRESH:1}    Referring MD: Storm Frisk, MD   Chief Complaint  Patient presents with   Follow-up    6 months.    History of Present Illness:    Derek Reed is a 38 y.o. male with a hx of chronic heart failure with previous severely depressed left ventricular systolic function (EF 25-30%) in the setting of nonischemic cardiomyopathy due to untreated severe hypertension.  Normal coronaries by previous cardiac catheterization and calcium score 0 by CT.  He had a remarkable improvement in LVEF to 55 to 60% by most recent echo in October 2022) once blood pressure was well controlled.  Additional problems include CKD stage III, most recent creatinine 1.40 and prediabetes (hemoglobin A1c 6.2% in the past).  He is doing well.  He is accompanied today by his 4 children, 3 daughters and a son.  He is working in a warehouse as a Estate agent, and has not had difficulty with shortness of breath, dizziness, palpitations or syncope.  He denies lower extremity edema, orthopnea, PND or chest pain.  He is interested in losing weight.  He asks about the gastric bypass or medications to help with weight loss.  He does have diabetes mellitus and is on glipizide.  Also has a fair lipid profile with normal triglycerides and LDL less than 70, but with a low HDL of 26.  He would like to stop taking nitrates since this would allow him to use PDE 5 inhibitors should he develop worsened erectile dysfunction in the future.  An arrhythmia monitor performed in August 2023 when he was having some palpitations and atypical chest pain was normal.  Current medical therapy involves high-dose hydralazine-nitrates, maximum dose Entresto, spironolactone and carvedilol as  well as chlorthalidone and amlodipine.  He reports compliance with all his medications.  Past Medical History:  Diagnosis Date   Acute CHF (congestive heart failure) (HCC) 02/24/2019   Anxiety    CKD (chronic kidney disease) stage 2, GFR 60-89 ml/min    Diabetes mellitus without complication (HCC)    Based on A1C. Pt diet controlled.   Essential hypertension 02/24/2019   GAD (generalized anxiety disorder) 03/26/2014   Hyperlipidemia    Hypertension    Obesity    Peritonsillar abscess     Past Surgical History:  Procedure Laterality Date   CHOLECYSTECTOMY     CHOLECYSTECTOMY N/A 01/15/2023   Procedure: LAPAROSCOPIC CHOLECYSTECTOMY;  Surgeon: Violeta Gelinas, MD;  Location[facility];  Service: General;  Laterality: N/A;   INCISION AND DRAINAGE OF PERITONSILLAR ABCESS     RIGHT/LEFT HEART CATH AND CORONARY ANGIOGRAPHY N/A 02/24/2019   Procedure: RIGHT/LEFT HEART CATH AND CORONARY ANGIOGRAPHY;  Surgeon: Laurey Morale, MD;  [facility] INVASIVE CV LAB;  Service: Cardiovascular;  Laterality: N/A;    Current Medications: Current Meds  Medication Sig   atorvastatin (LIPITOR) 10 MG tablet Take 0.5 tablets (5 mg total) by mouth daily.   Blood Pressure Monitor MISC Use to check blood pressure daily   carvedilol (COREG) 12.5 MG tablet Take 1 tablet (12.5 mg total) by mouth 2 (two) times daily with a meal.   chlorthalidone (HYGROTON) 25 MG tablet Take 1 tablet (25 mg total) by mouth daily.   glipiZIDE (GLUCOTROL)  5 MG tablet Take 0.5 tablets (2.5 mg total) by mouth daily before breakfast.   glucose blood (TRUE METRIX BLOOD GLUCOSE TEST) test strip Use to check blood sugar 2 (two) times daily as instructed.   hydrALAZINE (APRESOLINE) 50 MG tablet Take 1 tablet (50 mg total) by mouth 3 (three) times daily.   Iron, Ferrous Sulfate, 325 (65 Fe) MG TABS Take 1 tablet by mouth every other day.   potassium chloride SA (KLOR-CON M) 20 MEQ tablet Take 1 tablet (20 mEq total) by mouth 2 (two) times  daily.   sacubitril-valsartan (ENTRESTO) 97-103 MG Take 1 tablet by mouth 2 (two) times daily.   spironolactone (ALDACTONE) 25 MG tablet Take 1 tablet (25 mg total) by mouth daily.   [DISCONTINUED] isosorbide mononitrate (IMDUR) 30 MG 24 hr tablet Take 1 tablet (30 mg total) by mouth daily. OFFICE VISIT REQ FOR REFILLS     Allergies:   Egg-derived products   Social History   Socioeconomic History   Marital status: Married    Spouse name: Not on file   Number of children: Not on file   Years of education: Not on file   Highest education level: Not on file  Occupational History   Not on file  Tobacco Use   Smoking status: Former   Smokeless tobacco: Never  Vaping Use   Vaping status: Never Used  Substance and Sexual Activity   Alcohol use: No   Drug use: No   Sexual activity: Not on file  Other Topics Concern   Not on file  Social History Narrative   Not on file   Social Drivers of Health   Financial Resource Strain: Medium Risk (06/17/2023)   Overall Financial Resource Strain (CARDIA)    Difficulty of Paying Living Expenses: Somewhat hard  Food Insecurity: No Food Insecurity (06/17/2023)   Hunger Vital Sign    Worried About Running Out of Food in the Last Year: Never true    Ran Out of Food in the Last Year: Never true  Transportation Needs: No Transportation Needs (06/17/2023)   PRAPARE - Administrator, Civil Service (Medical): No    Lack of Transportation (Non-Medical): No  Physical Activity: Inactive (06/17/2023)   Exercise Vital Sign    Days of Exercise per Week: 0 days    Minutes of Exercise per Session: 0 min  Stress: No Stress Concern Present (06/17/2023)   [employer] of Occupational Health - Occupational Stress Questionnaire    Feeling of Stress : Only a little  Social Connections: Socially Isolated (06/17/2023)   Social Connection and Isolation Panel [NHANES]    Frequency of Communication with Friends and Family: Never    Frequency of Social  Gatherings with Friends and Family: Never    Attends Religious Services: Never    Database administrator or Organizations: No    Attends Engineer, structural: Never    Marital Status: Living with partner     Family History: The patient's family history includes Diabetes in his mother; Hyperlipidemia in his mother; Hypertension in his father and mother; Stroke in his mother.  ROS:   Please see the history of present illness.     All other systems reviewed and are negative.  EKGs/Labs/Other Studies Reviewed:    The following studies were reviewed today: Notes, labs, chest x-ray, ECGs performed during ER visits 07 13 and 07 16  Echocardiogram 03/06/2021  1. Left ventricular ejection fraction, by estimation, is 55 to 60%. The  left ventricle has normal function. The left ventricle has no regional  wall motion abnormalities. Left ventricular diastolic parameters were  normal.   2. Right ventricular systolic function is normal. The right ventricular  size is normal. Tricuspid regurgitation signal is inadequate for assessing  PA pressure.   3. The mitral valve is normal in structure. No evidence of mitral valve  regurgitation. No evidence of mitral stenosis.   4. The aortic valve is tricuspid. Aortic valve regurgitation is not  visualized. No aortic stenosis is present.   5. The inferior vena cava is normal in size with greater than 50%  respiratory variability, suggesting right atrial pressure of 3 mmHg.   EKG: ECG performed 01/15/2023 is personally reviewed and shows sinus rhythm and LVH with subtle secondary repolarization changes.  LVH voltage is masked by obesity.  EKG Interpretation Date/Time:    Ventricular Rate:    PR Interval:    QRS Duration:    QT Interval:    QTC Calculation:   R Axis:      Text Interpretation:           Recent Labs: 07/14/2022: Magnesium 1.8; TSH 1.186 11/03/2022: ALT 20 03/10/2023: BUN 18; Creatinine, Ser 1.59; Hemoglobin 11.1;  Platelets 260; Potassium 3.3; Sodium 138  Recent Lipid Panel    Component Value Date/Time   CHOL 93 (L) 03/10/2023 1004   TRIG 118 03/10/2023 1004   HDL 26 (L) 03/10/2023 1004   CHOLHDL 3.6 03/10/2023 1004   CHOLHDL 5.9 02/24/2019 0338   VLDL 13 02/24/2019 0338   LDLCALC 45 03/10/2023 1004     Risk Assessment/Calculations:           Physical Exam:    VS:  BP (!) 118/90 (BP Location: Left Arm, Patient Position: Sitting, Cuff Size: Large)   Pulse 72   Ht 5\' 6"  (1.676 m)   Wt 254 lb (115.2 kg)   BMI 41.00 kg/m     Wt Readings from Last 3 Encounters:  06/29/23 254 lb (115.2 kg)  06/17/23 252 lb 6.4 oz (114.5 kg)  03/10/23 255 lb (115.7 kg)     General: Alert, oriented x3, no distress, morbidly obese. Head: no evidence of trauma, PERRL, EOMI, no exophtalmos or lid lag, no myxedema, no xanthelasma; normal ears, nose and oropharynx Neck: normal jugular venous pulsations and no hepatojugular reflux; brisk carotid pulses without delay and no carotid bruits Chest: clear to auscultation, no signs of consolidation by percussion or palpation, normal fremitus, symmetrical and full respiratory excursions Cardiovascular: normal position and quality of the apical impulse, regular rhythm, normal first and second heart sounds, no murmurs, rubs or gallops Abdomen: no tenderness or distention, no masses by palpation, no abnormal pulsatility or arterial bruits, normal bowel sounds, no hepatosplenomegaly Extremities: no clubbing, cyanosis or edema; 2+ radial, ulnar and brachial pulses bilaterally; 2+ right femoral, posterior tibial and dorsalis pedis pulses; 2+ left femoral, posterior tibial and dorsalis pedis pulses; no subclavian or femoral bruits Neurological: grossly nonfocal Psych: Normal mood and affect   ASSESSMENT:    1. Morbid obesity (HCC)   2. Chronic diastolic heart failure (HCC)   3. Essential hypertension   4. Stage 3a chronic kidney disease (HCC)   5. Type 2 diabetes  mellitus with hyperglycemia, without long-term current use of insulin (HCC)   6. Dyslipidemia (high LDL; low HDL)     PLAN:    In order of problems listed above:  CHF: Clinically euvolemic, NYHA functional class I, had severely depressed LVEF that  rebounded completely after correction of severe hypertension.  I think we can stop his isosorbide mononitrate since he does not have heart failure.  Continue on carvedilol, hydralazine, Entresto, spironolactone, chlorthalidone. HTN: Severe, but currently very well-controlled on multiple agents. CKD 3A: Stable baseline creatinine in the 1.5-1.8 range, GFR mostly in the 40-50 range. Obesity: I think he would greatly benefit from weight loss.  Since he has diabetes, I think he is an excellent candidate for GLP-1 agonist and we engaged our pharmacy team to see if we can start him on 1 of these agents. DM2: Fair control on glipizide, which we may be able to stop once he starts Ozempic or Mounjaro.  These agents would be associated with improved long-term cardiovascular outcomes. HLP: Excellent LDL cholesterol, continue atorvastatin.  HDL is unlikely to improve unless he loses a lot of weight.           Medication Adjustments/Labs and Tests Ordered: Current medicines are reviewed at length with the patient today.  Concerns regarding medicines are outlined above.  Orders Placed This Encounter  Procedures   AMB Referral to [facility]   No orders of the defined types were placed in this encounter.   Patient Instructions  Medication Instructions:  - STOP isosorbide mononitrate (IMDUR) 30 MG 24 hr tablet    *If you need a refill on your cardiac medications before your next appointment, please call your pharmacy*   Lab Work: NONE    If you have labs (blood work) drawn today and your tests are completely normal, you will receive your results only by: MyChart Message (if you have MyChart) OR A paper copy in the mail If you have any lab  test that is abnormal or we need to change your treatment, we will call you to review the results.   Testing/Procedures: NONE    Follow-Up: At [facility], you and your health needs are our priority.  As part of our continuing mission to provide you with exceptional heart care, we have created designated Provider Care Teams.  These Care Teams include your primary Cardiologist (physician) and Advanced Practice Providers (APPs -  Physician Assistants and Nurse Practitioners) who all work together to provide you with the care you need, when you need it.  We recommend signing up for the patient portal called "MyChart".  Sign up information is provided on this After Visit Summary.  MyChart is used to connect with patients for Virtual Visits (Telemedicine).  Patients are able to view lab/test results, encounter notes, upcoming appointments, etc.  Non-urgent messages can be sent to your provider as well.   To learn more about what you can do with MyChart, go to [email or web address].    Your next appointment:   1 year(s)  The format for your next appointment:   In Person  Provider:   Thurmon Fair, MD    Other Instructions  Dr. Royann Shivers has referred you to our pharmacy for weightloss management with [facility]    Signed, Thurmon Fair, MD  06/30/2023 3:37 PM    [facility]

## 2023-06-30 ENCOUNTER — Other Ambulatory Visit (HOSPITAL_COMMUNITY): Payer: Self-pay

## 2023-06-30 ENCOUNTER — Encounter: Payer: Self-pay | Admitting: Cardiovascular Disease

## 2023-06-30 DIAGNOSIS — E785 Hyperlipidemia, unspecified: Secondary | ICD-10-CM | POA: Insufficient documentation

## 2023-06-30 DIAGNOSIS — I5032 Chronic diastolic (congestive) heart failure: Secondary | ICD-10-CM | POA: Insufficient documentation

## 2023-06-30 DIAGNOSIS — N1831 Chronic kidney disease, stage 3a: Secondary | ICD-10-CM | POA: Insufficient documentation

## 2023-06-30 MED ORDER — MOUNJARO 2.5 MG/0.5ML ~~LOC~~ SOAJ
2.5000 mg | SUBCUTANEOUS | 1 refills | Status: DC
  Filled 2023-06-30: qty 2, 28d supply, fill #0

## 2023-06-30 MED ORDER — MOUNJARO 5 MG/0.5ML ~~LOC~~ SOAJ
5.0000 mg | SUBCUTANEOUS | 1 refills | Status: DC
  Filled 2023-06-30 – 2023-07-23 (×4): qty 2, 28d supply, fill #0

## 2023-06-30 NOTE — Addendum Note (Signed)
Addended by: Rosalee Kaufman on: 06/30/2023 02:13 PM   Modules accepted: Orders

## 2023-06-30 NOTE — Telephone Encounter (Signed)
Pharmacy Patient Advocate Encounter  Received notification from [facility] that Prior Authorization for mounjaro has been APPROVED from 06/29/23 to 06/28/24. Ran test claim, Copay is $0.00. This test claim was processed through [facility]- copay amounts may vary at other pharmacies due to pharmacy/plan contracts, or as the patient moves through the different stages of their insurance plan.   PA #/Case ID/Reference #:  [id number]

## 2023-07-02 ENCOUNTER — Encounter: Payer: Self-pay | Admitting: Cardiovascular Disease

## 2023-07-02 MED ORDER — SILDENAFIL CITRATE 20 MG PO TABS: 60.0000 mg | ORAL_TABLET | Freq: Every day | ORAL | 3 refills | Status: DC | PRN

## 2023-07-02 NOTE — Telephone Encounter (Signed)
Sent via the mail order that popped up when I put the Rx in. It mentioned needing a prior auth.

## 2023-07-02 NOTE — Addendum Note (Signed)
Addended by: Thurmon Fair on: 07/02/2023 11:59 AM   Modules accepted: Orders

## 2023-07-05 ENCOUNTER — Encounter: Payer: Self-pay | Admitting: Cardiovascular Disease

## 2023-07-06 ENCOUNTER — Telehealth: Payer: Self-pay | Admitting: Pharmacy Technician

## 2023-07-06 ENCOUNTER — Other Ambulatory Visit (HOSPITAL_COMMUNITY): Payer: Self-pay

## 2023-07-06 DIAGNOSIS — N529 Male erectile dysfunction, unspecified: Secondary | ICD-10-CM

## 2023-07-06 NOTE — Telephone Encounter (Signed)
 Pharmacy Patient Advocate Encounter   Received notification from Patient Advice Request messages that prior authorization for Sildenafil is required/requested.   Insurance verification completed.   The patient is insured through [employer]  .   Per test claim: PA required; PA submitted to above mentioned insurance via CoverMyMeds Key/confirmation #/EOC [id number] Status is pending

## 2023-07-06 NOTE — Telephone Encounter (Signed)
 I have a letter ready for him. I sent him a message asking whether he wants to pick it up or we should mail it. Please have him stop glipizide completely when he stats the mounjaro.

## 2023-07-07 ENCOUNTER — Other Ambulatory Visit (HOSPITAL_COMMUNITY): Payer: Self-pay

## 2023-07-07 ENCOUNTER — Telehealth: Payer: Self-pay | Admitting: Pharmacy Technician

## 2023-07-07 ENCOUNTER — Other Ambulatory Visit: Payer: Self-pay

## 2023-07-07 MED ORDER — SILDENAFIL CITRATE 50 MG PO TABS
50.0000 mg | ORAL_TABLET | Freq: Every day | ORAL | 5 refills | Status: AC | PRN
  Filled 2023-07-07: qty 30, 30d supply, fill #0
  Filled 2023-09-29: qty 30, 30d supply, fill #1

## 2023-07-07 NOTE — Telephone Encounter (Signed)
 Changed to generic Viagra 50mg 

## 2023-07-07 NOTE — Telephone Encounter (Signed)
 Yes please

## 2023-07-07 NOTE — Telephone Encounter (Signed)
 Ran test claim for sildenafil 50mg . For 30 tablets for a 30 day supply and the co-pay is 24.31 . No prior authorization required. This test claim was processed through [facility]- copay amounts may vary at other pharmacies due to pharmacy/plan contracts, or as the patient moves through the different stages of their insurance plan.

## 2023-07-07 NOTE — Telephone Encounter (Signed)
 Pharmacy Patient Advocate Encounter  Received notification from [facility] [employer]  that Prior Authorization for sildenafil has been DENIED.  Full denial letter will be uploaded to the media tab. See denial reason below.   PA #/Case ID/Reference #: [id number]

## 2023-07-07 NOTE — Telephone Encounter (Signed)
 Absolutely.  The only reason I picked the 20 mg is the computer showed that the 50 mg dose was not reimbursable.

## 2023-07-07 NOTE — Addendum Note (Signed)
 Addended by: Cheree Ditto on: 07/07/2023 03:46 PM   Modules accepted: Orders

## 2023-07-07 NOTE — Telephone Encounter (Signed)
 Thanks

## 2023-07-08 ENCOUNTER — Encounter (HOSPITAL_BASED_OUTPATIENT_CLINIC_OR_DEPARTMENT_OTHER): Payer: Self-pay

## 2023-07-08 ENCOUNTER — Other Ambulatory Visit (HOSPITAL_BASED_OUTPATIENT_CLINIC_OR_DEPARTMENT_OTHER): Payer: Self-pay

## 2023-07-08 ENCOUNTER — Emergency Department (HOSPITAL_BASED_OUTPATIENT_CLINIC_OR_DEPARTMENT_OTHER)
Admission: EM | Admit: 2023-07-08 | Discharge: 2023-07-08 | Disposition: A | Discharge status: Home / Self Care | Payer: Managed Care, Other (non HMO) | Attending: Emergency Medicine | Admitting: Emergency Medicine

## 2023-07-08 ENCOUNTER — Other Ambulatory Visit: Payer: Self-pay

## 2023-07-08 DIAGNOSIS — Z79899 Other long term (current) drug therapy: Secondary | ICD-10-CM | POA: Diagnosis not present

## 2023-07-08 DIAGNOSIS — I509 Heart failure, unspecified: Secondary | ICD-10-CM | POA: Insufficient documentation

## 2023-07-08 DIAGNOSIS — I130 Hypertensive heart and chronic kidney disease with heart failure and stage 1 through stage 4 chronic kidney disease, or unspecified chronic kidney disease: Secondary | ICD-10-CM | POA: Insufficient documentation

## 2023-07-08 DIAGNOSIS — L03317 Cellulitis of buttock: Secondary | ICD-10-CM | POA: Insufficient documentation

## 2023-07-08 DIAGNOSIS — N189 Chronic kidney disease, unspecified: Secondary | ICD-10-CM | POA: Insufficient documentation

## 2023-07-08 DIAGNOSIS — E1122 Type 2 diabetes mellitus with diabetic chronic kidney disease: Secondary | ICD-10-CM | POA: Insufficient documentation

## 2023-07-08 DIAGNOSIS — L0231 Cutaneous abscess of buttock: Secondary | ICD-10-CM | POA: Diagnosis present

## 2023-07-08 MED ORDER — DOXYCYCLINE HYCLATE 100 MG PO CAPS
100.0000 mg | ORAL_CAPSULE | Freq: Two times a day (BID) | ORAL | 0 refills | Status: DC
  Filled 2023-07-08: qty 20, 10d supply, fill #0

## 2023-07-08 NOTE — ED Notes (Signed)

## 2023-07-08 NOTE — ED Triage Notes (Signed)
 Patient here POV from Home.  Endorses noting discomfort to upper mid buttocks (possible cyst or insect bite). Present for week and worsening. Mild drainage. No Fevers. No N/V/D.   NAD noted during Triage. A&Ox4. Gcs 15. Ambulatory.

## 2023-07-08 NOTE — Discharge Instructions (Addendum)
 You were seen in the emergency department for the area of irritation and pain in your left upper buttock area. I have looked at this with an ultrasound and do not see a clear collection of fluid to drain. Given this, I have placed you on a course of antibiotics to cover for infection and recommend you use warm compresses and warm soaks in the bath with Epsom salts to help with this. You can take Tylenol for pain.   Keep an eye on the area and if you have increasing pain, redness, or fevers, then you need to return for intervention.  Please follow-up closely with your primary care provider as well to have a wound check in the next few days.  In the future, you can use an antiseptic (chlorhexidine) soap from the pharmacy 1-2 x per month in the areas where abscesses are most likely to form (armpits, buttocks, groin). This soap can dry your skin out so use it sparingly. Once do so once this area has fully healed.   Continue to monitor how you're doing and return to the ER for new or worsening symptoms.

## 2023-07-08 NOTE — ED Provider Notes (Signed)
 [facility] AT [facility] Provider Note   CSN: [id number] Arrival date & time: 07/08/23  2130     History  Chief Complaint  Patient presents with   Cyst    Derek Reed is a 38 y.o. male.  Patient with well controlled diabetes, CKD, CHF, htn presents today with complaints of abscess. He states that he has an area of irritation in his left upper buttock area that has been bothering him for the past week. He denies history of similar previously. No fevers or chills. 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 The area is not draining.   The history is provided by the patient. No language interpreter was used.       Home Medications Prior to Admission medications   Medication Sig Start Date End Date Taking? Authorizing Provider  atorvastatin (LIPITOR) 10 MG tablet Take 0.5 tablets (5 mg total) by mouth daily. 06/17/23   Anders Simmonds, PA-C  Blood Pressure Monitor MISC Use to check blood pressure daily 03/10/23   Mayers, Cari S, PA-C  carvedilol (COREG) 12.5 MG tablet Take 1 tablet (12.5 mg total) by mouth 2 (two) times daily with a meal. 01/26/23   Storm Frisk, MD  chlorthalidone (HYGROTON) 25 MG tablet Take 1 tablet (25 mg total) by mouth daily. 01/26/23   Storm Frisk, MD  glipiZIDE (GLUCOTROL) 5 MG tablet Take 0.5 tablets (2.5 mg total) by mouth daily before breakfast. 06/17/23   Anders Simmonds, PA-C  glucose blood (TRUE METRIX BLOOD GLUCOSE TEST) test strip Use to check blood sugar 2 (two) times daily as instructed. 03/10/23   Mayers, Cari S, PA-C  hydrALAZINE (APRESOLINE) 50 MG tablet Take 1 tablet (50 mg total) by mouth 3 (three) times daily. 04/28/23   Storm Frisk, MD  Iron, Ferrous Sulfate, 325 (65 Fe) MG TABS Take 1 tablet by mouth every other day. 03/11/23   Mayers, Cari S, PA-C  potassium chloride SA  (KLOR-CON M) 20 MEQ tablet Take 1 tablet (20 mEq total) by mouth 2 (two) times daily. 06/17/23   Anders Simmonds, PA-C  sacubitril-valsartan (ENTRESTO) 97-103 MG Take 1 tablet by mouth 2 (two) times daily. 01/26/23   Storm Frisk, MD  sildenafil (VIAGRA) 50 MG tablet Take 1 tablet (50 mg total) by mouth daily as needed for erectile dysfunction. 07/07/23   Croitoru, Mihai, MD  spironolactone (ALDACTONE) 25 MG tablet Take 1 tablet (25 mg total) by mouth daily. 01/26/23   Storm Frisk, MD  tirzepatide [facility]) 2.5 MG/0.5ML Pen Inject 2.5 mg into the skin once a week. 06/30/23   Croitoru, Mihai, MD  tirzepatide [facility]) 5 MG/0.5ML Pen Inject 5 mg into the skin once a week. 06/30/23   Croitoru, Rachelle Hora, MD      Allergies    Egg-derived products    Review of Systems   Review of Systems  All other systems reviewed and are negative.   Physical Exam Updated Vital Signs BP 118/78 (BP Location: Right Arm)   Pulse 77   Temp 98.7 F (37.1 C) (Oral)   Resp 18   Ht 5\' 6"  (1.676 m)   Wt 111.1 kg   SpO2 98%   BMI 39.54 kg/m  Physical Exam Vitals and nursing note reviewed.  Constitutional:      General: He is not in  acute distress.    Appearance: Normal appearance. He is normal weight. He is not ill-appearing, toxic-appearing or diaphoretic.  HENT:     Head: Normocephalic and atraumatic.  Cardiovascular:     Rate and Rhythm: Normal rate.  Pulmonary:     Effort: Pulmonary effort is normal. No respiratory distress.  Genitourinary:    Comments: Small area of tenderness noted to the left upper buttock area without fluctuance or induration.  Musculoskeletal:        General: Normal range of motion.     Cervical back: Normal range of motion.  Skin:    General: Skin is warm and dry.  Neurological:     General: No focal deficit present.     Mental Status: He is alert.  Psychiatric:        Mood and Affect: Mood normal.        Behavior: Behavior normal.     ED Results / Procedures /  Treatments   Labs (all labs ordered are listed, but only abnormal results are displayed) Labs Reviewed - No data to display  EKG None  Radiology No results found.  Procedures .Ultrasound ED Soft Tissue  Date/Time: 07/08/2023 10:29 AM  Performed by: Silva Bandy, PA-C Authorized by: Silva Bandy, PA-C   Procedure details:    Indications: localization of abscess     Transverse view:  Visualized   Longitudinal view:  Visualized   Images: not archived     Limitations:  Body habitus and positioning Location:    Location: buttocks     Side:  Left     Medications Ordered in ED Medications - No data to display  ED Course/ Medical Decision Making/ A&P                                 Medical Decision Making  Patient presents today with concern for abscess in his left buttock. He is afebrile, non-toxic appearing, and in no acute distress with reassuring vital signs. Physical exam reveals area of tenderness without fluctuance or induration. Bedside US performed by me, no clear drainable collection. Will treat for cellulitis with doxycycline, recommend close pcp follow-up for wound check in the next few days. Also recommend warm soaks and Epsom salt baths, sitting on a cushion to relieve pressure on the area given his periods of extended sitting. Evaluation and diagnostic testing in the emergency department does not suggest an emergent condition requiring admission or immediate intervention beyond what has been performed at this time.  Plan for discharge with close PCP follow-up.  Patient is understanding and amenable with plan, educated on red flag symptoms that would prompt immediate return.  Patient discharged in stable condition.  Final Clinical Impression(s) / ED Diagnoses Final diagnoses:  Cellulitis of buttock    Rx / DC Orders ED Discharge Orders          Ordered    doxycycline (VIBRAMYCIN) 100 MG capsule  2 times daily        07/08/23 6962          An After  Visit Summary was printed and given to the patient.     Vear Clock 07/08/23 1033    Melene Plan, DO 07/08/23 1101

## 2023-07-08 NOTE — ED Notes (Signed)
 Cannot discharge, registration in chart.

## 2023-07-10 ENCOUNTER — Other Ambulatory Visit: Payer: Self-pay | Admitting: Physician Assistant

## 2023-07-10 ENCOUNTER — Emergency Department (HOSPITAL_COMMUNITY)
Admission: EM | Admit: 2023-07-10 | Discharge: 2023-07-10 | Disposition: A | Discharge status: Home / Self Care | Attending: Emergency Medicine | Admitting: Emergency Medicine

## 2023-07-10 ENCOUNTER — Encounter (HOSPITAL_COMMUNITY): Payer: Self-pay

## 2023-07-10 DIAGNOSIS — R55 Syncope and collapse: Secondary | ICD-10-CM | POA: Diagnosis not present

## 2023-07-10 DIAGNOSIS — E119 Type 2 diabetes mellitus without complications: Secondary | ICD-10-CM | POA: Insufficient documentation

## 2023-07-10 DIAGNOSIS — N182 Chronic kidney disease, stage 2 (mild): Secondary | ICD-10-CM | POA: Insufficient documentation

## 2023-07-10 DIAGNOSIS — D509 Iron deficiency anemia, unspecified: Secondary | ICD-10-CM

## 2023-07-10 DIAGNOSIS — I509 Heart failure, unspecified: Secondary | ICD-10-CM | POA: Diagnosis not present

## 2023-07-10 DIAGNOSIS — R42 Dizziness and giddiness: Secondary | ICD-10-CM | POA: Diagnosis present

## 2023-07-10 DIAGNOSIS — Z79899 Other long term (current) drug therapy: Secondary | ICD-10-CM | POA: Insufficient documentation

## 2023-07-10 DIAGNOSIS — I130 Hypertensive heart and chronic kidney disease with heart failure and stage 1 through stage 4 chronic kidney disease, or unspecified chronic kidney disease: Secondary | ICD-10-CM | POA: Insufficient documentation

## 2023-07-10 LAB — CBC
HCT: 38.7 % — ABNORMAL LOW (ref 39.0–52.0)
Hemoglobin: 12.3 g/dL — ABNORMAL LOW (ref 13.0–17.0)
MCH: 23.1 pg — ABNORMAL LOW (ref 26.0–34.0)
MCHC: 31.8 g/dL (ref 30.0–36.0)
MCV: 72.7 fL — ABNORMAL LOW (ref 80.0–100.0)
Platelets: 267 10*3/uL (ref 150–400)
RBC: 5.32 MIL/uL (ref 4.22–5.81)
RDW: 15.4 % (ref 11.5–15.5)
WBC: 8.5 10*3/uL (ref 4.0–10.5)
nRBC: 0 % (ref 0.0–0.2)

## 2023-07-10 LAB — URINALYSIS, ROUTINE W REFLEX MICROSCOPIC
Bacteria, UA: NONE SEEN
Bilirubin Urine: NEGATIVE
Glucose, UA: NEGATIVE mg/dL
Ketones, ur: NEGATIVE mg/dL
Leukocytes,Ua: NEGATIVE
Nitrite: NEGATIVE
Protein, ur: NEGATIVE mg/dL
Specific Gravity, Urine: 1.023 (ref 1.005–1.030)
pH: 5 (ref 5.0–8.0)

## 2023-07-10 LAB — BASIC METABOLIC PANEL
Anion gap: 10 (ref 5–15)
BUN: 23 mg/dL — ABNORMAL HIGH (ref 6–20)
CO2: 26 mmol/L (ref 22–32)
Calcium: 8.9 mg/dL (ref 8.9–10.3)
Chloride: 99 mmol/L (ref 98–111)
Creatinine, Ser: 1.64 mg/dL — ABNORMAL HIGH (ref 0.61–1.24)
GFR, Estimated: 54 mL/min — ABNORMAL LOW (ref >60)
Glucose, Bld: 122 mg/dL — ABNORMAL HIGH (ref 70–99)
Potassium: 3.2 mmol/L — ABNORMAL LOW (ref 3.5–5.1)
Sodium: 135 mmol/L (ref 135–145)

## 2023-07-10 LAB — MAGNESIUM: Magnesium: 1.9 mg/dL (ref 1.7–2.4)

## 2023-07-10 MED ORDER — LACTATED RINGERS IV BOLUS
1000.0000 mL | Freq: Once | INTRAVENOUS | Status: AC
Start: 2023-07-10 — End: 2023-07-10
  Administered 2023-07-10: 1000 mL via INTRAVENOUS

## 2023-07-10 MED ORDER — POTASSIUM CHLORIDE CRYS ER 20 MEQ PO TBCR
40.0000 meq | EXTENDED_RELEASE_TABLET | Freq: Once | ORAL | Status: AC
  Administered 2023-07-10: 40 meq via ORAL
  Filled 2023-07-10: qty 2

## 2023-07-10 NOTE — ED Triage Notes (Signed)
 Pt is coming from the [employer] distribution [place], While at work her experinced some dizzinees. He went on his break and then felt dizzy like he was going was going to passout. 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 No chest pain, no shoirtness of breath. Pt has been off his glipizide for 3 days, Per doctor.   Medic vitals   136/62 88hr 98%ra 131bgl 18rr

## 2023-07-10 NOTE — Discharge Instructions (Addendum)
 Your workup today in the emergency department was reassuring.  Your potassium was slightly low.  You received a dose of this in the emergency department.  Keep taking your home potassium supplement.  Follow-up with your primary care doctor and cardiologist.  Return for any concerning symptoms.  This could have been due to dehydration.  Drink plenty of fluids.  You received some IV fluids in the emergency department.

## 2023-07-10 NOTE — ED Provider Notes (Signed)
 [place] EMERGENCY DEPARTMENT AT [facility] Provider Note   CSN: [id number] Arrival date & time: 07/10/23  9528     History  Chief Complaint  Patient presents with   Dizziness    Derek Reed is a 38 y.o. male.  38 year old male presents today for concern of an episode of lightheadedness that he noticed while he was at work.  He works at a [employer].  He does have history of CKD and CHF.  States he drinks about 2-4 16 fl oz bottles per day.  Is compliant with all of his home medications including spironolactone.  He denies any chest pain, palpitations, or other symptoms surrounding the episode.  States he felt lightheaded and his body went numb.  No other complaints.  Currently feels better.  He states when the symptoms started he alerted his boss who activated EMS.  He sat down and had improvement of his symptoms.  He did not have a syncopal episode.  The history is provided by the patient. No language interpreter was used.       Home Medications Prior to Admission medications   Medication Sig Start Date End Date Taking? Authorizing Provider  atorvastatin (LIPITOR) 10 MG tablet Take 0.5 tablets (5 mg total) by mouth daily. 06/17/23   Anders Simmonds, PA-C  Blood Pressure Monitor MISC Use to check blood pressure daily 03/10/23   Mayers, Cari S, PA-C  carvedilol (COREG) 12.5 MG tablet Take 1 tablet (12.5 mg total) by mouth 2 (two) times daily with a meal. 01/26/23   Storm Frisk, MD  chlorthalidone (HYGROTON) 25 MG tablet Take 1 tablet (25 mg total) by mouth daily. 01/26/23   Storm Frisk, MD  doxycycline (VIBRAMYCIN) 100 MG capsule Take 1 capsule (100 mg total) by mouth 2 (two) times daily. 07/08/23   Smoot, Shawn Route, PA-C  glipiZIDE (GLUCOTROL) 5 MG tablet Take 0.5 tablets (2.5 mg total) by mouth daily before breakfast. 06/17/23   Anders Simmonds, PA-C  glucose blood (TRUE METRIX BLOOD GLUCOSE TEST) test strip Use to check blood sugar 2 (two) times  daily as instructed. 03/10/23   Mayers, Cari S, PA-C  hydrALAZINE (APRESOLINE) 50 MG tablet Take 1 tablet (50 mg total) by mouth 3 (three) times daily. 04/28/23   Storm Frisk, MD  Iron, Ferrous Sulfate, 325 (65 Fe) MG TABS Take 1 tablet by mouth every other day. 03/11/23   Mayers, Cari S, PA-C  potassium chloride SA (KLOR-CON M) 20 MEQ tablet Take 1 tablet (20 mEq total) by mouth 2 (two) times daily. 06/17/23   Anders Simmonds, PA-C  sacubitril-valsartan (ENTRESTO) 97-103 MG Take 1 tablet by mouth 2 (two) times daily. 01/26/23   Storm Frisk, MD  sildenafil (VIAGRA) 50 MG tablet Take 1 tablet (50 mg total) by mouth daily as needed for erectile dysfunction. 07/07/23   Croitoru, Mihai, MD  spironolactone (ALDACTONE) 25 MG tablet Take 1 tablet (25 mg total) by mouth daily. 01/26/23   Storm Frisk, MD  tirzepatide [facility]) 2.5 MG/0.5ML Pen Inject 2.5 mg into the skin once a week. 06/30/23   Croitoru, Mihai, MD  tirzepatide [facility]) 5 MG/0.5ML Pen Inject 5 mg into the skin once a week. 06/30/23   Croitoru, Mihai, MD      Allergies    Egg-derived products    Review of Systems   Review of Systems  Constitutional:  Negative for chills and fever.  Respiratory:  Negative for shortness of breath.  Cardiovascular:  Negative for chest pain.  Neurological:  Positive for light-headedness.  All other systems reviewed and are negative.   Physical Exam Updated Vital Signs BP 109/73   Pulse 84   Temp 98.4 F (36.9 C)   Resp 17   SpO2 100%  Physical Exam Vitals and nursing note reviewed.  Constitutional:      General: He is not in acute distress.    Appearance: Normal appearance. He is not ill-appearing.  HENT:     Head: Normocephalic and atraumatic.     Nose: Nose normal.  Eyes:     General: No scleral icterus.    Extraocular Movements: Extraocular movements intact.     Conjunctiva/sclera: Conjunctivae normal.  Cardiovascular:     Rate and Rhythm: Normal rate and regular  rhythm.     Pulses: Normal pulses.     Heart sounds: Normal heart sounds.  Pulmonary:     Effort: Pulmonary effort is normal. No respiratory distress.     Breath sounds: Normal breath sounds. No wheezing or rales.  Abdominal:     General: There is no distension.  Musculoskeletal:        General: Normal range of motion.     Cervical back: Normal range of motion.     Right lower leg: No edema.     Left lower leg: No edema.  Skin:    General: Skin is warm and dry.  Neurological:     General: No focal deficit present.     Mental Status: He is alert. Mental status is at baseline.     ED Results / Procedures / Treatments   Labs (all labs ordered are listed, but only abnormal results are displayed) Labs Reviewed  BASIC METABOLIC PANEL - Abnormal; Notable for the following components:      Result Value   Potassium 3.2 (*)    Glucose, Bld 122 (*)    BUN 23 (*)    Creatinine, Ser 1.64 (*)    GFR, Estimated 54 (*)    All other components within normal limits  CBC - Abnormal; Notable for the following components:   Hemoglobin 12.3 (*)    HCT 38.7 (*)    MCV 72.7 (*)    MCH 23.1 (*)    All other components within normal limits  URINALYSIS, ROUTINE W REFLEX MICROSCOPIC - Abnormal; Notable for the following components:   APPearance HAZY (*)    Hgb urine dipstick SMALL (*)    All other components within normal limits  MAGNESIUM    EKG EKG Interpretation Date/Time:  Saturday July 10 2023 04:55:30 EST Ventricular Rate:  80 PR Interval:  200 QRS Duration:  84 QT Interval:  368 QTC Calculation: 424 R Axis:   -28  Text Interpretation: Normal sinus rhythm Minimal voltage criteria for LVH, may be normal variant ( R in aVL ) No significant change since last tracing Confirmed by Cathren Laine ([id number]) on 07/10/2023 7:10:48 AM  Radiology No results found.  Procedures Procedures    Medications Ordered in ED Medications  lactated ringers bolus 1,000 mL (has no administration in  time range)  potassium chloride SA (KLOR-CON M) CR tablet 40 mEq (has no administration in time range)    ED Course/ Medical Decision Making/ A&P                                 Medical Decision Making Amount and/or Complexity of Data Reviewed  Labs: ordered.  Risk Prescription drug management.   Medical Decision Making / ED Course   This patient presents to the ED for concern of near syncopal episode, this involves an extensive number of treatment options, and is a complaint that carries with it a high risk of complications and morbidity.  The differential diagnosis includes dehydration, arrhythmia, orthostasis, vertigo  MDM: 38 year old male with past medical history significant for CKD, CHF presents today for concern of near syncopal episode.  He works at [employer].  Does not appear he drinks enough fluids.  He drinks about 2-4 bottles per day.  Take spironolactone in addition to that.  Reports compliance with all of his home medications.  Per recent echo he has preserved ejection fraction.  Will give fluid bolus, obtain labs and reevaluate.  Without chest pain.  EKG without acute ischemic change.  No other associated symptoms other than feeling numb and lightheaded.  Low suspicion for ACS.  CBC without leukocytosis.  Anemia at patient's baseline.  BMP shows potassium of 3.2.  P.o. repletion given.  Creatinine of 1.64.  This is around his baseline for his CKD.  UA without acute concerns.  Magnesium 1.9.  Maintain on telemetry.  No signs of arrhythmia.  He will follow-up with his cardiologist.  Feels better after fluids.  He is stable for discharge.  Discharged in stable condition.  Return precaution discussed.  Lab Tests: -I ordered, reviewed, and interpreted labs.   The pertinent results include:   Labs Reviewed  BASIC METABOLIC PANEL - Abnormal; Notable for the following components:      Result Value   Potassium 3.2 (*)    Glucose, Bld 122 (*)    BUN 23 (*)    Creatinine,  Ser 1.64 (*)    GFR, Estimated 54 (*)    All other components within normal limits  CBC - Abnormal; Notable for the following components:   Hemoglobin 12.3 (*)    HCT 38.7 (*)    MCV 72.7 (*)    MCH 23.1 (*)    All other components within normal limits  URINALYSIS, ROUTINE W REFLEX MICROSCOPIC - Abnormal; Notable for the following components:   APPearance HAZY (*)    Hgb urine dipstick SMALL (*)    All other components within normal limits  MAGNESIUM      EKG  EKG Interpretation Date/Time:  Saturday July 10 2023 04:55:30 EST Ventricular Rate:  80 PR Interval:  200 QRS Duration:  84 QT Interval:  368 QTC Calculation: 424 R Axis:   -28  Text Interpretation: Normal sinus rhythm Minimal voltage criteria for LVH, may be normal variant ( R in aVL ) No significant change since last tracing Confirmed by Cathren Laine ([id number]) on 07/10/2023 7:10:48 AM        Medicines ordered and prescription drug management: Meds ordered this encounter  Medications   lactated ringers bolus 1,000 mL   potassium chloride SA (KLOR-CON M) CR tablet 40 mEq    -I have reviewed the patients home medicines and have made adjustments as needed  Cardiac Monitoring: The patient was maintained on a cardiac monitor.  I personally viewed and interpreted the cardiac monitored which showed an underlying rhythm of: NSR  Reevaluation: After the interventions noted above, I reevaluated the patient and found that they have :improved  Co morbidities that complicate the patient evaluation  Past Medical History:  Diagnosis Date   Acute CHF (congestive heart failure) (HCC) 02/24/2019   Anxiety    CKD (  chronic kidney disease) stage 2, GFR 60-89 ml/min    Diabetes mellitus without complication (HCC)    Based on A1C. Pt diet controlled.   Essential hypertension 02/24/2019   GAD (generalized anxiety disorder) 03/26/2014   Hyperlipidemia    Hypertension    Obesity    Peritonsillar abscess        Dispostion: Discharged with stable condition.  Return precaution discussed.  Patient voices understanding and is in agreement with plan.  Final Clinical Impression(s) / ED Diagnoses Final diagnoses:  Near syncope    Rx / DC Orders ED Discharge Orders     None         Marita Kansas, PA-C 07/10/23 1031    Cathren Laine, MD 07/11/23 1654

## 2023-07-11 ENCOUNTER — Other Ambulatory Visit: Payer: Self-pay

## 2023-07-12 ENCOUNTER — Encounter (HOSPITAL_BASED_OUTPATIENT_CLINIC_OR_DEPARTMENT_OTHER): Payer: Self-pay | Admitting: Emergency Medicine

## 2023-07-12 ENCOUNTER — Other Ambulatory Visit: Payer: Self-pay

## 2023-07-12 ENCOUNTER — Emergency Department (HOSPITAL_BASED_OUTPATIENT_CLINIC_OR_DEPARTMENT_OTHER)
Admission: EM | Admit: 2023-07-12 | Discharge: 2023-07-13 | Disposition: A | Discharge status: Home / Self Care | Attending: Emergency Medicine | Admitting: Emergency Medicine

## 2023-07-12 ENCOUNTER — Other Ambulatory Visit (HOSPITAL_BASED_OUTPATIENT_CLINIC_OR_DEPARTMENT_OTHER): Payer: Self-pay

## 2023-07-12 ENCOUNTER — Other Ambulatory Visit (HOSPITAL_COMMUNITY): Payer: Self-pay

## 2023-07-12 DIAGNOSIS — Z7984 Long term (current) use of oral hypoglycemic drugs: Secondary | ICD-10-CM | POA: Diagnosis not present

## 2023-07-12 DIAGNOSIS — L0501 Pilonidal cyst with abscess: Secondary | ICD-10-CM

## 2023-07-12 DIAGNOSIS — L0591 Pilonidal cyst without abscess: Secondary | ICD-10-CM | POA: Diagnosis present

## 2023-07-12 MED ORDER — IRON (FERROUS SULFATE) 325 (65 FE) MG PO TABS
325.0000 mg | ORAL_TABLET | ORAL | 0 refills | Status: DC
  Filled 2023-07-12: qty 15, 30d supply, fill #0
  Filled 2023-08-08: qty 15, 30d supply, fill #1
  Filled 2023-09-12 – 2023-09-29 (×2): qty 15, 30d supply, fill #2

## 2023-07-12 MED ORDER — LIDOCAINE-EPINEPHRINE (PF) 2 %-1:200000 IJ SOLN
10.0000 mL | Freq: Once | INTRAMUSCULAR | Status: AC
  Administered 2023-07-12: 10 mL via INTRADERMAL
  Filled 2023-07-12: qty 20

## 2023-07-12 MED ORDER — DOXYCYCLINE HYCLATE 100 MG PO CAPS: 100.0000 mg | ORAL_CAPSULE | Freq: Two times a day (BID) | ORAL | 0 refills | Status: DC

## 2023-07-12 NOTE — ED Provider Notes (Signed)
 [facility] [facility] Provider Note   CSN: [id number] Arrival date & time: 07/12/23  1817     History  Chief Complaint  Patient presents with   Abscess    Derek Reed is a 38 y.o. male.  38 yo M with a chief complaints of a area to his bottom that is painful.  It has been there for about a week or so now.  He had been seen in the ED a few days ago and did not have any obvious area that could benefit from I&D.  Since then he feels like things have gotten worse.  No fevers.  He was also seen in the ED a couple days ago for near syncopal event.  He feels better since then has not had any recurrent issues.  He denies ever having a abscess in this area before.   Abscess      Home Medications Prior to Admission medications   Medication Sig Start Date End Date Taking? Authorizing Provider  atorvastatin (LIPITOR) 10 MG tablet Take 0.5 tablets (5 mg total) by mouth daily. 06/17/23   Anders Simmonds, PA-C  Blood Pressure Monitor MISC Use to check blood pressure daily 03/10/23   Mayers, Cari S, PA-C  carvedilol (COREG) 12.5 MG tablet Take 1 tablet (12.5 mg total) by mouth 2 (two) times daily with a meal. 01/26/23   Storm Frisk, MD  chlorthalidone (HYGROTON) 25 MG tablet Take 1 tablet (25 mg total) by mouth daily. 01/26/23   Storm Frisk, MD  doxycycline (VIBRAMYCIN) 100 MG capsule Take 1 capsule (100 mg total) by mouth 2 (two) times daily. 07/12/23   Melene Plan, DO  glipiZIDE (GLUCOTROL) 5 MG tablet Take 0.5 tablets (2.5 mg total) by mouth daily before breakfast. 06/17/23   Anders Simmonds, PA-C  glucose blood (TRUE METRIX BLOOD GLUCOSE TEST) test strip Use to check blood sugar 2 (two) times daily as instructed. 03/10/23   Mayers, Cari S, PA-C  hydrALAZINE (APRESOLINE) 50 MG tablet Take 1 tablet (50 mg total) by mouth 3 (three) times daily. 04/28/23   Storm Frisk, MD  Iron, Ferrous Sulfate, 325 (65 Fe) MG TABS Take 1 tablet by mouth every  other day. 07/12/23   Mayers, Cari S, PA-C  potassium chloride SA (KLOR-CON M) 20 MEQ tablet Take 1 tablet (20 mEq total) by mouth 2 (two) times daily. 06/17/23   Anders Simmonds, PA-C  sacubitril-valsartan (ENTRESTO) 97-103 MG Take 1 tablet by mouth 2 (two) times daily. 01/26/23   Storm Frisk, MD  sildenafil (VIAGRA) 50 MG tablet Take 1 tablet (50 mg total) by mouth daily as needed for erectile dysfunction. 07/07/23   Croitoru, Mihai, MD  spironolactone (ALDACTONE) 25 MG tablet Take 1 tablet (25 mg total) by mouth daily. 01/26/23   Storm Frisk, MD  tirzepatide [facility]) 2.5 MG/0.5ML Pen Inject 2.5 mg into the skin once a week. 06/30/23   Croitoru, Mihai, MD  tirzepatide [facility]) 5 MG/0.5ML Pen Inject 5 mg into the skin once a week. 06/30/23   Croitoru, Mihai, MD      Allergies    Egg-derived products    Review of Systems   Review of Systems  Physical Exam Updated Vital Signs BP 107/67   Pulse 78   Temp 98.2 F (36.8 C) (Oral)   Resp 20   Ht 5\' 6"  (1.676 m)   Wt 111.1 kg   SpO2 98%   BMI 39.54 kg/m  Physical  Exam Vitals and nursing note reviewed.  Constitutional:      Appearance: He is well-developed.  HENT:     Head: Normocephalic and atraumatic.  Eyes:     Pupils: Pupils are equal, round, and reactive to light.  Neck:     Vascular: No JVD.  Cardiovascular:     Rate and Rhythm: Normal rate and regular rhythm.     Heart sounds: No murmur heard.    No friction rub. No gallop.  Pulmonary:     Effort: No respiratory distress.     Breath sounds: No wheezing.  Abdominal:     General: There is no distension.     Tenderness: There is no abdominal tenderness. There is no guarding or rebound.  Genitourinary:    Comments: Pain and fullness about the pilonidal region.  Worse on the left than the right. Musculoskeletal:        General: Normal range of motion.     Cervical back: Normal range of motion and neck supple.  Skin:    Coloration: Skin is not pale.      Findings: No rash.  Neurological:     Mental Status: He is alert and oriented to person, place, and time.  Psychiatric:        Behavior: Behavior normal.     ED Results / Procedures / Treatments   Labs (all labs ordered are listed, but only abnormal results are displayed) Labs Reviewed - No data to display  EKG None  Radiology No results found.  Procedures .Incision and Drainage  Date/Time: 07/12/2023 11:52 PM  Performed by: Melene Plan, DO Authorized by: Melene Plan, DO   Consent:    Consent obtained:  Verbal   Consent given by:  Patient   Risks, benefits, and alternatives were discussed: yes     Risks discussed:  Bleeding, incomplete drainage and infection   Alternatives discussed:  No treatment, delayed treatment and alternative treatment Universal protocol:    Procedure explained and questions answered to patient or proxy's satisfaction: yes     Patient identity confirmed:  Verbally with patient Location:    Type:  Abscess   Location:  Anogenital   Anogenital location:  Pilonidal Pre-procedure details:    Skin preparation:  Chlorhexidine Sedation:    Sedation type:  None Anesthesia:    Anesthesia method:  Local infiltration   Local anesthetic:  Lidocaine 2% WITH epi Procedure type:    Complexity:  Complex Procedure details:    Ultrasound guidance: no     Needle aspiration: no     Incision types:  Single straight   Incision depth:  Subcutaneous   Wound management:  Probed and deloculated   Drainage:  Purulent and bloody   Drainage amount:  Copious   Wound treatment:  Wound left open   Packing materials:  None Post-procedure details:    Procedure completion:  Tolerated well, no immediate complications     Medications Ordered in ED Medications  lidocaine-EPINEPHrine (XYLOCAINE W/EPI) 2 %-1:200000 (PF) injection 10 mL (10 mLs Intradermal Given 07/12/23 2336)    ED Course/ Medical Decision Making/ A&P                                 Medical Decision  Making Risk Prescription drug management.   38 yo M with a cc of pain and swelling about the pilonidal region.  Worse on the left than the right.  Going on for  about a week or so.  Was seen in the ED at the onset is thought not to have a drainable area.  Trial of antibiotics without improvement.  On my review the patient also was seen a couple days ago for a near syncopal event.  He feels better since then.  Denies any recurrent issues.  Patient later endorsed that he never got his antibiotics filled.  I will represcribe today.  Warm compresses.  General surgery follow-up.  11:54 PM:  I have discussed the diagnosis/risks/treatment options with the patient.  Evaluation and diagnostic testing in the emergency department does not suggest an emergent condition requiring admission or immediate intervention beyond what has been performed at this time.  They will follow up with PCP, gen surgery. We also discussed returning to the ED immediately if new or worsening sx occur. We discussed the sx which are most concerning (e.g., sudden worsening pain, fever, inability to tolerate by mouth) that necessitate immediate return. Medications administered to the patient during their visit and any new prescriptions provided to the patient are listed below.  Medications given during this visit Medications  lidocaine-EPINEPHrine (XYLOCAINE W/EPI) 2 %-1:200000 (PF) injection 10 mL (10 mLs Intradermal Given 07/12/23 2336)     The patient appears reasonably screen and/or stabilized for discharge and I doubt any other medical condition or other [facility] requiring further screening, evaluation, or treatment in the ED at this time prior to discharge.          Final Clinical Impression(s) / ED Diagnoses Final diagnoses:  Pilonidal abscess    Rx / DC Orders ED Discharge Orders          Ordered    doxycycline (VIBRAMYCIN) 100 MG capsule  2 times daily        07/12/23 2351              Melene Plan,  DO 07/12/23 2354

## 2023-07-12 NOTE — ED Triage Notes (Signed)
 Has two bumps on top of his butt that hurt and  states they are not draining  x 1 week pt is diabetic

## 2023-07-12 NOTE — Discharge Instructions (Signed)
 Warm compresses at least 4 times a day.  Please return for rapid spreading redness or if you develop fever.  I have given you information to follow-up with the general surgeons.  Sometimes they need to do a procedure to remove a cyst.

## 2023-07-13 ENCOUNTER — Other Ambulatory Visit (HOSPITAL_BASED_OUTPATIENT_CLINIC_OR_DEPARTMENT_OTHER): Payer: Self-pay

## 2023-07-13 ENCOUNTER — Encounter: Payer: Self-pay | Admitting: Family Medicine

## 2023-07-19 ENCOUNTER — Other Ambulatory Visit (HOSPITAL_COMMUNITY): Payer: Self-pay

## 2023-07-20 ENCOUNTER — Other Ambulatory Visit (HOSPITAL_COMMUNITY): Payer: Self-pay

## 2023-07-21 ENCOUNTER — Encounter: Payer: Self-pay | Admitting: Cardiovascular Disease

## 2023-07-23 ENCOUNTER — Other Ambulatory Visit (HOSPITAL_COMMUNITY): Payer: Self-pay

## 2023-08-02 ENCOUNTER — Other Ambulatory Visit (HOSPITAL_COMMUNITY): Payer: Self-pay

## 2023-08-06 ENCOUNTER — Other Ambulatory Visit (HOSPITAL_COMMUNITY): Payer: Self-pay

## 2023-08-09 ENCOUNTER — Other Ambulatory Visit: Payer: Self-pay

## 2023-08-09 ENCOUNTER — Other Ambulatory Visit (HOSPITAL_COMMUNITY): Payer: Self-pay

## 2023-08-23 ENCOUNTER — Other Ambulatory Visit (HOSPITAL_COMMUNITY): Payer: Self-pay

## 2023-08-23 ENCOUNTER — Encounter: Payer: Self-pay | Admitting: Pharmacist Clinician (PhC)/ Clinical Pharmacy Specialist

## 2023-08-23 MED ORDER — MOUNJARO 10 MG/0.5ML ~~LOC~~ SOAJ
10.0000 mg | SUBCUTANEOUS | 0 refills | Status: DC
  Filled 2023-08-23: qty 2, 28d supply, fill #0

## 2023-08-23 MED ORDER — MOUNJARO 7.5 MG/0.5ML ~~LOC~~ SOAJ
7.5000 mg | SUBCUTANEOUS | 0 refills | Status: DC
  Filled 2023-08-23 – 2023-09-08 (×2): qty 2, 28d supply, fill #0

## 2023-08-27 ENCOUNTER — Encounter (HOSPITAL_COMMUNITY): Payer: Self-pay

## 2023-08-27 ENCOUNTER — Other Ambulatory Visit (HOSPITAL_COMMUNITY): Payer: Self-pay

## 2023-08-27 ENCOUNTER — Other Ambulatory Visit: Payer: Self-pay | Admitting: Critical Care Medicine

## 2023-08-27 DIAGNOSIS — I5022 Chronic systolic (congestive) heart failure: Secondary | ICD-10-CM

## 2023-08-27 MED ORDER — HYDRALAZINE HCL 50 MG PO TABS
50.0000 mg | ORAL_TABLET | Freq: Three times a day (TID) | ORAL | 0 refills | Status: DC
  Filled 2023-08-27: qty 90, 30d supply, fill #0

## 2023-08-27 MED ORDER — SPIRONOLACTONE 25 MG PO TABS
25.0000 mg | ORAL_TABLET | Freq: Every day | ORAL | 0 refills | Status: DC
  Filled 2023-08-27: qty 30, 30d supply, fill #0

## 2023-08-30 ENCOUNTER — Other Ambulatory Visit (HOSPITAL_COMMUNITY): Payer: Self-pay

## 2023-08-31 ENCOUNTER — Other Ambulatory Visit (HOSPITAL_COMMUNITY): Payer: Self-pay

## 2023-09-01 ENCOUNTER — Encounter (HOSPITAL_BASED_OUTPATIENT_CLINIC_OR_DEPARTMENT_OTHER): Payer: Self-pay | Admitting: Emergency Medicine

## 2023-09-01 ENCOUNTER — Emergency Department (HOSPITAL_BASED_OUTPATIENT_CLINIC_OR_DEPARTMENT_OTHER)
Admission: EM | Admit: 2023-09-01 | Discharge: 2023-09-01 | Disposition: A | Discharge status: Home / Self Care | Attending: Emergency Medicine | Admitting: Emergency Medicine

## 2023-09-01 ENCOUNTER — Other Ambulatory Visit: Payer: Self-pay

## 2023-09-01 DIAGNOSIS — E1122 Type 2 diabetes mellitus with diabetic chronic kidney disease: Secondary | ICD-10-CM | POA: Diagnosis not present

## 2023-09-01 DIAGNOSIS — N189 Chronic kidney disease, unspecified: Secondary | ICD-10-CM | POA: Diagnosis not present

## 2023-09-01 DIAGNOSIS — R197 Diarrhea, unspecified: Secondary | ICD-10-CM | POA: Diagnosis present

## 2023-09-01 DIAGNOSIS — E860 Dehydration: Secondary | ICD-10-CM | POA: Diagnosis not present

## 2023-09-01 DIAGNOSIS — I509 Heart failure, unspecified: Secondary | ICD-10-CM | POA: Diagnosis not present

## 2023-09-01 DIAGNOSIS — I130 Hypertensive heart and chronic kidney disease with heart failure and stage 1 through stage 4 chronic kidney disease, or unspecified chronic kidney disease: Secondary | ICD-10-CM | POA: Insufficient documentation

## 2023-09-01 DIAGNOSIS — Z79899 Other long term (current) drug therapy: Secondary | ICD-10-CM | POA: Diagnosis not present

## 2023-09-01 LAB — CBC WITH DIFFERENTIAL/PLATELET
Abs Immature Granulocytes: 0.03 10*3/uL (ref 0.00–0.07)
Basophils Absolute: 0 10*3/uL (ref 0.0–0.1)
Basophils Relative: 0 %
Eosinophils Absolute: 0.5 10*3/uL (ref 0.0–0.5)
Eosinophils Relative: 6 %
HCT: 35.8 % — ABNORMAL LOW (ref 39.0–52.0)
Hemoglobin: 11.8 g/dL — ABNORMAL LOW (ref 13.0–17.0)
Immature Granulocytes: 0 %
Lymphocytes Relative: 44 %
Lymphs Abs: 3.8 10*3/uL (ref 0.7–4.0)
MCH: 23.5 pg — ABNORMAL LOW (ref 26.0–34.0)
MCHC: 33 g/dL (ref 30.0–36.0)
MCV: 71.3 fL — ABNORMAL LOW (ref 80.0–100.0)
Monocytes Absolute: 1 10*3/uL (ref 0.1–1.0)
Monocytes Relative: 11 %
Neutro Abs: 3.4 10*3/uL (ref 1.7–7.7)
Neutrophils Relative %: 39 %
Platelets: 294 10*3/uL (ref 150–400)
RBC: 5.02 MIL/uL (ref 4.22–5.81)
RDW: 15.3 % (ref 11.5–15.5)
WBC: 8.7 10*3/uL (ref 4.0–10.5)
nRBC: 0 % (ref 0.0–0.2)

## 2023-09-01 LAB — URINALYSIS, ROUTINE W REFLEX MICROSCOPIC
Bilirubin Urine: NEGATIVE
Glucose, UA: NEGATIVE mg/dL
Hgb urine dipstick: NEGATIVE
Ketones, ur: NEGATIVE mg/dL
Leukocytes,Ua: NEGATIVE
Nitrite: NEGATIVE
Protein, ur: NEGATIVE mg/dL
Specific Gravity, Urine: 1.025 (ref 1.005–1.030)
pH: 5.5 (ref 5.0–8.0)

## 2023-09-01 LAB — COMPREHENSIVE METABOLIC PANEL WITH GFR
ALT: 11 U/L (ref 0–44)
AST: 16 U/L (ref 15–41)
Albumin: 4.1 g/dL (ref 3.5–5.0)
Alkaline Phosphatase: 73 U/L (ref 38–126)
Anion gap: 16 — ABNORMAL HIGH (ref 5–15)
BUN: 16 mg/dL (ref 6–20)
CO2: 20 mmol/L — ABNORMAL LOW (ref 22–32)
Calcium: 9.5 mg/dL (ref 8.9–10.3)
Chloride: 99 mmol/L (ref 98–111)
Creatinine, Ser: 1.83 mg/dL — ABNORMAL HIGH (ref 0.61–1.24)
GFR, Estimated: 47 mL/min — ABNORMAL LOW (ref >60)
Glucose, Bld: 109 mg/dL — ABNORMAL HIGH (ref 70–99)
Potassium: 3.5 mmol/L (ref 3.5–5.1)
Sodium: 135 mmol/L (ref 135–145)
Total Bilirubin: 0.3 mg/dL (ref 0.0–1.2)
Total Protein: 8 g/dL (ref 6.5–8.1)

## 2023-09-01 LAB — LIPASE, BLOOD: Lipase: 27 U/L (ref 11–51)

## 2023-09-01 MED ORDER — ONDANSETRON 4 MG PO TBDP
4.0000 mg | ORAL_TABLET | Freq: Once | ORAL | Status: AC
  Administered 2023-09-01: 4 mg via ORAL
  Filled 2023-09-01: qty 1

## 2023-09-01 MED ORDER — ONDANSETRON 4 MG PO TBDP
4.0000 mg | ORAL_TABLET | Freq: Three times a day (TID) | ORAL | 0 refills | Status: DC | PRN
  Filled 2023-09-01: qty 12, 4d supply, fill #0

## 2023-09-01 NOTE — ED Triage Notes (Signed)
 Pt c/o generalized abd pain and diarrhea since yesterday

## 2023-09-01 NOTE — ED Provider Notes (Signed)
 [facility] Provider Note   CSN: [id number] Arrival date & time: 09/01/23  1843     History {Add pertinent medical, surgical, social history, OB history to HPI:1} Chief Complaint  Patient presents with   Diarrhea    Derek Reed is a 38 y.o. male.  The history is provided by the patient.  Diarrhea Derek Reed is a 38 y.o. male who presents to the Emergency Department complaining of *** Sick since yesterday.  Nothing to eat today Two episodes of diarrhea Central abdominal pain. No vomiting but has nausea. No dysuria.   No sick contacts. No bad food.  No fever.  Hx/o cholecystectomy.  Hx/o CHF, HTN, DM     Home Medications Prior to Admission medications   Medication Sig Start Date End Date Taking? Authorizing Provider  atorvastatin  (LIPITOR) 10 MG tablet Take 0.5 tablets (5 mg total) by mouth daily. 06/17/23   Hassie Lint, PA-C  Blood Pressure Monitor MISC Use to check blood pressure daily 03/10/23   Mayers, Cari S, PA-C  carvedilol  (COREG ) 12.5 MG tablet Take 1 tablet (12.5 mg total) by mouth 2 (two) times daily with a meal. 01/26/23   Vernell Goldsmith, MD  chlorthalidone  (HYGROTON ) 25 MG tablet Take 1 tablet (25 mg total) by mouth daily. 01/26/23   Vernell Goldsmith, MD  doxycycline  (VIBRAMYCIN ) 100 MG capsule Take 1 capsule (100 mg total) by mouth 2 (two) times daily. 07/12/23   Floyd, Dan, DO  glipiZIDE  (GLUCOTROL ) 5 MG tablet Take 0.5 tablets (2.5 mg total) by mouth daily before breakfast. 06/17/23   Hassie Lint, PA-C  glucose blood (TRUE METRIX BLOOD GLUCOSE TEST) test strip Use to check blood sugar 2 (two) times daily as instructed. 03/10/23   Mayers, Cari S, PA-C  hydrALAZINE  (APRESOLINE ) 50 MG tablet Take 1 tablet (50 mg total) by mouth 3 (three) times daily. 08/27/23   Newlin, Enobong, MD  Iron , Ferrous Sulfate , 325 (65 Fe) MG TABS Take 1 tablet by mouth every other day. 07/12/23   Mayers, Cari S, PA-C  potassium  chloride SA (KLOR-CON  M) 20 MEQ tablet Take 1 tablet (20 mEq total) by mouth 2 (two) times daily. 06/17/23   McClung, Angela M, PA-C  sacubitril -valsartan  (ENTRESTO ) 97-103 MG Take 1 tablet by mouth 2 (two) times daily. 01/26/23   Vernell Goldsmith, MD  sildenafil  (VIAGRA ) 50 MG tablet Take 1 tablet (50 mg total) by mouth daily as needed for erectile dysfunction. 07/07/23   Croitoru, Mihai, MD  spironolactone  (ALDACTONE ) 25 MG tablet Take 1 tablet (25 mg total) by mouth daily. 08/27/23   Newlin, Enobong, MD  tirzepatide  (MOUNJARO ) 10 MG/0.5ML Pen Inject 10 mg into the skin once a week. 08/23/23   Croitoru, Mihai, MD  tirzepatide  (MOUNJARO ) 7.5 MG/0.5ML Pen Inject 7.5 mg into the skin once a week. 08/23/23   Croitoru, Karyl Paget, MD      Allergies    Egg-derived products    Review of Systems   Review of Systems  Gastrointestinal:  Positive for diarrhea.  All other systems reviewed and are negative.   Physical Exam Updated Vital Signs BP 109/74 (BP Location: Right Arm)   Pulse 77   Temp 97.8 F (36.6 C)   Resp 16   Ht 5\' 6"  (1.676 m)   Wt 111.1 kg   SpO2 100%   BMI 39.53 kg/m  Physical Exam Vitals and nursing note reviewed.  Constitutional:      Appearance: He is well-developed.  HENT:     Head: Normocephalic and atraumatic.  Cardiovascular:     Rate and Rhythm: Normal rate and regular rhythm.  Pulmonary:     Effort: Pulmonary effort is normal. No respiratory distress.  Abdominal:     Palpations: Abdomen is soft.     Tenderness: There is no abdominal tenderness. There is no guarding or rebound.  Musculoskeletal:        General: No tenderness.  Skin:    General: Skin is warm and dry.  Neurological:     Mental Status: He is alert and oriented to person, place, and time.  Psychiatric:        Behavior: Behavior normal.     ED Results / Procedures / Treatments   Labs (all labs ordered are listed, but only abnormal results are displayed) Labs Reviewed  COMPREHENSIVE METABOLIC  PANEL WITH GFR - Abnormal; Notable for the following components:      Result Value   CO2 20 (*)    Glucose, Bld 109 (*)    Creatinine, Ser 1.83 (*)    GFR, Estimated 47 (*)    Anion gap 16 (*)    All other components within normal limits  CBC WITH DIFFERENTIAL/PLATELET - Abnormal; Notable for the following components:   Hemoglobin 11.8 (*)    HCT 35.8 (*)    MCV 71.3 (*)    MCH 23.5 (*)    All other components within normal limits  LIPASE, BLOOD  URINALYSIS, ROUTINE W REFLEX MICROSCOPIC    EKG None  Radiology No results found.  Procedures Procedures  {Document cardiac monitor, telemetry assessment procedure when appropriate:1}  Medications Ordered in ED Medications - No data to display  ED Course/ Medical Decision Making/ A&P   {   Click here for ABCD2, HEART and other calculatorsREFRESH Note before signing :1}                              Medical Decision Making Amount and/or Complexity of Data Reviewed Labs: ordered.   ***  {Document critical care time when appropriate:1} {Document review of labs and clinical decision tools ie heart score, Chads2Vasc2 etc:1}  {Document your independent review of radiology images, and any outside records:1} {Document your discussion with family members, caretakers, and with consultants:1} {Document social determinants of health affecting pt's care:1} {Document your decision making why or why not admission, treatments were needed:1} Final Clinical Impression(s) / ED Diagnoses Final diagnoses:  None    Rx / DC Orders ED Discharge Orders     None

## 2023-09-02 ENCOUNTER — Other Ambulatory Visit (HOSPITAL_COMMUNITY): Payer: Self-pay

## 2023-09-03 ENCOUNTER — Emergency Department (HOSPITAL_BASED_OUTPATIENT_CLINIC_OR_DEPARTMENT_OTHER)
Admission: EM | Admit: 2023-09-03 | Discharge: 2023-09-04 | Disposition: A | Discharge status: Home / Self Care | Attending: Emergency Medicine | Admitting: Emergency Medicine

## 2023-09-03 ENCOUNTER — Other Ambulatory Visit: Payer: Self-pay

## 2023-09-03 ENCOUNTER — Encounter (HOSPITAL_BASED_OUTPATIENT_CLINIC_OR_DEPARTMENT_OTHER): Payer: Self-pay | Admitting: Urology

## 2023-09-03 DIAGNOSIS — I509 Heart failure, unspecified: Secondary | ICD-10-CM | POA: Diagnosis not present

## 2023-09-03 DIAGNOSIS — R1012 Left upper quadrant pain: Secondary | ICD-10-CM | POA: Insufficient documentation

## 2023-09-03 DIAGNOSIS — I130 Hypertensive heart and chronic kidney disease with heart failure and stage 1 through stage 4 chronic kidney disease, or unspecified chronic kidney disease: Secondary | ICD-10-CM | POA: Diagnosis not present

## 2023-09-03 DIAGNOSIS — Z794 Long term (current) use of insulin: Secondary | ICD-10-CM | POA: Diagnosis not present

## 2023-09-03 DIAGNOSIS — Z7984 Long term (current) use of oral hypoglycemic drugs: Secondary | ICD-10-CM | POA: Insufficient documentation

## 2023-09-03 DIAGNOSIS — N189 Chronic kidney disease, unspecified: Secondary | ICD-10-CM | POA: Insufficient documentation

## 2023-09-03 DIAGNOSIS — Z79899 Other long term (current) drug therapy: Secondary | ICD-10-CM | POA: Diagnosis not present

## 2023-09-03 DIAGNOSIS — N289 Disorder of kidney and ureter, unspecified: Secondary | ICD-10-CM

## 2023-09-03 DIAGNOSIS — R197 Diarrhea, unspecified: Secondary | ICD-10-CM | POA: Insufficient documentation

## 2023-09-03 DIAGNOSIS — E119 Type 2 diabetes mellitus without complications: Secondary | ICD-10-CM | POA: Insufficient documentation

## 2023-09-03 LAB — CBC WITH DIFFERENTIAL/PLATELET
Abs Immature Granulocytes: 0.06 10*3/uL (ref 0.00–0.07)
Basophils Absolute: 0 10*3/uL (ref 0.0–0.1)
Basophils Relative: 0 %
Eosinophils Absolute: 0.6 10*3/uL — ABNORMAL HIGH (ref 0.0–0.5)
Eosinophils Relative: 6 %
HCT: 34.7 % — ABNORMAL LOW (ref 39.0–52.0)
Hemoglobin: 11.3 g/dL — ABNORMAL LOW (ref 13.0–17.0)
Immature Granulocytes: 1 %
Lymphocytes Relative: 38 %
Lymphs Abs: 3.8 10*3/uL (ref 0.7–4.0)
MCH: 23.4 pg — ABNORMAL LOW (ref 26.0–34.0)
MCHC: 32.6 g/dL (ref 30.0–36.0)
MCV: 71.8 fL — ABNORMAL LOW (ref 80.0–100.0)
Monocytes Absolute: 0.8 10*3/uL (ref 0.1–1.0)
Monocytes Relative: 8 %
Neutro Abs: 4.9 10*3/uL (ref 1.7–7.7)
Neutrophils Relative %: 47 %
Platelets: 262 10*3/uL (ref 150–400)
RBC: 4.83 MIL/uL (ref 4.22–5.81)
RDW: 15.4 % (ref 11.5–15.5)
WBC: 10.2 10*3/uL (ref 4.0–10.5)
nRBC: 0 % (ref 0.0–0.2)

## 2023-09-03 NOTE — ED Triage Notes (Signed)
 Pt seen here 2 nights ago for abd pain and diarrhea  States since has gotten worse  Denies pain but states diarrhea is worse  Did not pick up imodium  rx

## 2023-09-03 NOTE — ED Provider Notes (Signed)
 [facility] Provider Note   CSN: [id number] Arrival date & time: 09/03/23  2049     History  Chief Complaint  Patient presents with   Diarrhea    Derek Reed is a 38 y.o. male.  The history is provided by the patient.  Diarrhea He has history of hypertension, diabetes, hyperlipidemia, chronic kidney disease, heart failure and comes in because of ongoing upper abdominal pain and diarrhea.  He has been having diarrhea and left upper abdominal pain for about a week.  He had been in the emergency department 2 days ago and had been given a prescription for ondansetron  for nausea, but has not picked it up.  He has not been taking anything for diarrhea but has been trying to drink Gatorade.  He denies fever or chills.  He has had some nausea but no vomiting.  He denies any sick contacts.  He denies any blood or mucus in the stool and denies any unusual odor to the stool.  He does state that about 1 week prior to onset of the symptoms he had increased his dose of tirzepatide .   Home Medications Prior to Admission medications   Medication Sig Start Date End Date Taking? Authorizing Provider  atorvastatin  (LIPITOR) 10 MG tablet Take 0.5 tablets (5 mg total) by mouth daily. 06/17/23   Hassie Lint, PA-C  Blood Pressure Monitor MISC Use to check blood pressure daily 03/10/23   Mayers, Cari S, PA-C  carvedilol  (COREG ) 12.5 MG tablet Take 1 tablet (12.5 mg total) by mouth 2 (two) times daily with a meal. 01/26/23   Vernell Goldsmith, MD  chlorthalidone  (HYGROTON ) 25 MG tablet Take 1 tablet (25 mg total) by mouth daily. 01/26/23   Vernell Goldsmith, MD  glipiZIDE  (GLUCOTROL ) 5 MG tablet Take 0.5 tablets (2.5 mg total) by mouth daily before breakfast. 06/17/23   Hassie Lint, PA-C  glucose blood (TRUE METRIX BLOOD GLUCOSE TEST) test strip Use to check blood sugar 2 (two) times daily as instructed. 03/10/23   Mayers, Cari S, PA-C  hydrALAZINE   (APRESOLINE ) 50 MG tablet Take 1 tablet (50 mg total) by mouth 3 (three) times daily. 08/27/23   Newlin, Enobong, MD  Iron , Ferrous Sulfate , 325 (65 Fe) MG TABS Take 1 tablet by mouth every other day. 07/12/23   Mayers, Cari S, PA-C  ondansetron  (ZOFRAN -ODT) 4 MG disintegrating tablet Take 1 tablet (4 mg total) by mouth every 8 (eight) hours as needed. 09/01/23   Kelsey Patricia, MD  potassium chloride  SA (KLOR-CON  M) 20 MEQ tablet Take 1 tablet (20 mEq total) by mouth 2 (two) times daily. 06/17/23   Hassie Lint, PA-C  sacubitril -valsartan  (ENTRESTO ) 97-103 MG Take 1 tablet by mouth 2 (two) times daily. 01/26/23   Vernell Goldsmith, MD  sildenafil  (VIAGRA ) 50 MG tablet Take 1 tablet (50 mg total) by mouth daily as needed for erectile dysfunction. 07/07/23   Croitoru, Mihai, MD  spironolactone  (ALDACTONE ) 25 MG tablet Take 1 tablet (25 mg total) by mouth daily. 08/27/23   Newlin, Enobong, MD  tirzepatide  (MOUNJARO ) 10 MG/0.5ML Pen Inject 10 mg into the skin once a week. 08/23/23   Croitoru, Mihai, MD  tirzepatide  (MOUNJARO ) 7.5 MG/0.5ML Pen Inject 7.5 mg into the skin once a week. 08/23/23   Croitoru, Karyl Paget, MD      Allergies    Egg-derived products    Review of Systems   Review of Systems  Gastrointestinal:  Positive for diarrhea.  All other systems reviewed and are negative.   Physical Exam Updated Vital Signs BP 110/68   Pulse 77   Temp 98.1 F (36.7 C) (Oral)   Resp 19   Ht 5\' 6"  (1.676 m)   Wt 111.1 kg   SpO2 100%   BMI 39.53 kg/m  Physical Exam Vitals and nursing note reviewed.   38 year old male, resting comfortably and in no acute distress. Vital signs are normal. Oxygen saturation is 99%, which is normal. Head is normocephalic and atraumatic. PERRLA, EOMI. Oropharynx is clear.  Mucous membranes are moist. Lungs are clear without rales, wheezes, or rhonchi. Heart has regular rate and rhythm without murmur. Abdomen is soft, flat, nontender. Neurologic: Mental status is  normal, cranial nerves are intact, moves all extremities equally.  ED Results / Procedures / Treatments   Labs (all labs ordered are listed, but only abnormal results are displayed) Labs Reviewed  CBC WITH DIFFERENTIAL/PLATELET - Abnormal; Notable for the following components:      Result Value   Hemoglobin 11.3 (*)    HCT 34.7 (*)    MCV 71.8 (*)    MCH 23.4 (*)    Eosinophils Absolute 0.6 (*)    All other components within normal limits  COMPREHENSIVE METABOLIC PANEL WITH GFR - Abnormal; Notable for the following components:   Glucose, Bld 100 (*)    BUN 21 (*)    Creatinine, Ser 1.89 (*)    AST 14 (*)    GFR, Estimated 45 (*)    All other components within normal limits  C DIFFICILE QUICK SCREEN W PCR REFLEX    LIPASE, BLOOD   Procedures Procedures    Medications Ordered in ED Medications  loperamide  (IMODIUM ) capsule 4 mg (4 mg Oral Given 09/04/23 0231)    ED Course/ Medical Decision Making/ A&P                                 Medical Decision Making Amount and/or Complexity of Data Reviewed Labs: ordered.  Risk Prescription drug management.   Diarrhea, abdominal pain, nausea.  Differential diagnosis includes, but is not limited to, viral enteritis, side effect of tirzepatide , C. difficile colitis.  I have reviewed his past records, and he had been seen in the emergency department 2 days ago at which time he was offered IV fluids and declined.  Also, he had been seen in the emergency department on 07/08/2023 for cellulitis of the buttock and on 07/12/2023 for pilonidal abscess and was discharged with a prescription for doxycycline .  This would put him at risk for C. difficile colitis.  I have ordered screening labs and advised him that if he could provide a stool sample that we would test for C. difficile.  I have ordered a dose of loperamide .  I have reviewed his laboratory tests, and my interpretation is normal WBC, stable anemia, stable renal insufficiency.  He has  not produced a bowel movement while in the emergency department.  I am discharging him with instructions to use loperamide  as needed for diarrhea.  If this is a side effect of his tirzepatide , it should subside over the next several weeks.  I have advised him of this.  Return precautions discussed.  Final Clinical Impression(s) / ED Diagnoses Final diagnoses:  Diarrhea of presumed infectious origin  Renal insufficiency    Rx / DC Orders ED Discharge Orders     None  Alissa April, MD 09/04/23 [phone]

## 2023-09-04 LAB — COMPREHENSIVE METABOLIC PANEL WITH GFR
ALT: 11 U/L (ref 0–44)
AST: 14 U/L — ABNORMAL LOW (ref 15–41)
Albumin: 3.9 g/dL (ref 3.5–5.0)
Alkaline Phosphatase: 78 U/L (ref 38–126)
Anion gap: 14 (ref 5–15)
BUN: 21 mg/dL — ABNORMAL HIGH (ref 6–20)
CO2: 23 mmol/L (ref 22–32)
Calcium: 9.3 mg/dL (ref 8.9–10.3)
Chloride: 100 mmol/L (ref 98–111)
Creatinine, Ser: 1.89 mg/dL — ABNORMAL HIGH (ref 0.61–1.24)
GFR, Estimated: 45 mL/min — ABNORMAL LOW (ref >60)
Glucose, Bld: 100 mg/dL — ABNORMAL HIGH (ref 70–99)
Potassium: 3.5 mmol/L (ref 3.5–5.1)
Sodium: 137 mmol/L (ref 135–145)
Total Bilirubin: <0.2 mg/dL (ref 0.0–1.2)
Total Protein: 7.4 g/dL (ref 6.5–8.1)

## 2023-09-04 LAB — LIPASE, BLOOD: Lipase: 27 U/L (ref 11–51)

## 2023-09-04 MED ORDER — LOPERAMIDE HCL 2 MG PO CAPS
4.0000 mg | ORAL_CAPSULE | Freq: Once | ORAL | Status: AC
Start: 2023-09-04 — End: 2023-09-04
  Administered 2023-09-04: 4 mg via ORAL
  Filled 2023-09-04: qty 2

## 2023-09-04 NOTE — Discharge Instructions (Addendum)
 Take loperamide  (Imodium  A-D) as needed for diarrhea.  Return to the emergency department if you start running a fever, start having blood in your stool, or if your symptoms are getting worse in any other way.  It is possible that your symptoms are related to your increased dose of tirzepatide  (Mounjaro ).  Usually side effects like this gradually improve over several weeks.  If symptoms persist, talk with your primary care provider about whether you need to stop the tirzepatide  (Mounjaro ).

## 2023-09-08 ENCOUNTER — Other Ambulatory Visit (HOSPITAL_COMMUNITY): Payer: Self-pay

## 2023-09-08 DIAGNOSIS — R197 Diarrhea, unspecified: Secondary | ICD-10-CM | POA: Insufficient documentation

## 2023-09-08 DIAGNOSIS — E782 Mixed hyperlipidemia: Secondary | ICD-10-CM | POA: Diagnosis not present

## 2023-09-08 DIAGNOSIS — N1831 Chronic kidney disease, stage 3a: Secondary | ICD-10-CM | POA: Insufficient documentation

## 2023-09-08 DIAGNOSIS — I130 Hypertensive heart and chronic kidney disease with heart failure and stage 1 through stage 4 chronic kidney disease, or unspecified chronic kidney disease: Secondary | ICD-10-CM | POA: Insufficient documentation

## 2023-09-08 DIAGNOSIS — E1122 Type 2 diabetes mellitus with diabetic chronic kidney disease: Secondary | ICD-10-CM | POA: Diagnosis not present

## 2023-09-08 DIAGNOSIS — E8722 Chronic metabolic acidosis: Secondary | ICD-10-CM | POA: Insufficient documentation

## 2023-09-08 DIAGNOSIS — R112 Nausea with vomiting, unspecified: Secondary | ICD-10-CM | POA: Diagnosis present

## 2023-09-08 DIAGNOSIS — N179 Acute kidney failure, unspecified: Secondary | ICD-10-CM | POA: Diagnosis not present

## 2023-09-08 DIAGNOSIS — N289 Disorder of kidney and ureter, unspecified: Secondary | ICD-10-CM | POA: Insufficient documentation

## 2023-09-08 DIAGNOSIS — E876 Hypokalemia: Secondary | ICD-10-CM | POA: Diagnosis not present

## 2023-09-08 DIAGNOSIS — Z79899 Other long term (current) drug therapy: Secondary | ICD-10-CM | POA: Diagnosis not present

## 2023-09-08 DIAGNOSIS — I5032 Chronic diastolic (congestive) heart failure: Secondary | ICD-10-CM | POA: Diagnosis not present

## 2023-09-09 ENCOUNTER — Other Ambulatory Visit: Payer: Self-pay

## 2023-09-09 ENCOUNTER — Encounter (HOSPITAL_BASED_OUTPATIENT_CLINIC_OR_DEPARTMENT_OTHER): Payer: Self-pay | Admitting: Emergency Medicine

## 2023-09-09 ENCOUNTER — Emergency Department (HOSPITAL_BASED_OUTPATIENT_CLINIC_OR_DEPARTMENT_OTHER)

## 2023-09-09 ENCOUNTER — Other Ambulatory Visit (HOSPITAL_COMMUNITY): Payer: Self-pay

## 2023-09-09 ENCOUNTER — Observation Stay (HOSPITAL_BASED_OUTPATIENT_CLINIC_OR_DEPARTMENT_OTHER)
Admission: EM | Admit: 2023-09-09 | Discharge: 2023-09-10 | Disposition: A | Discharge status: Home / Self Care | Attending: Internal Medicine | Admitting: Internal Medicine

## 2023-09-09 DIAGNOSIS — N189 Chronic kidney disease, unspecified: Secondary | ICD-10-CM

## 2023-09-09 DIAGNOSIS — I5032 Chronic diastolic (congestive) heart failure: Secondary | ICD-10-CM | POA: Diagnosis not present

## 2023-09-09 DIAGNOSIS — N179 Acute kidney failure, unspecified: Secondary | ICD-10-CM | POA: Diagnosis present

## 2023-09-09 DIAGNOSIS — E1122 Type 2 diabetes mellitus with diabetic chronic kidney disease: Secondary | ICD-10-CM | POA: Diagnosis not present

## 2023-09-09 DIAGNOSIS — T50905S Adverse effect of unspecified drugs, medicaments and biological substances, sequela: Secondary | ICD-10-CM

## 2023-09-09 DIAGNOSIS — N183 Chronic kidney disease, stage 3 unspecified: Secondary | ICD-10-CM | POA: Diagnosis present

## 2023-09-09 DIAGNOSIS — E782 Mixed hyperlipidemia: Secondary | ICD-10-CM | POA: Diagnosis not present

## 2023-09-09 DIAGNOSIS — N1831 Chronic kidney disease, stage 3a: Secondary | ICD-10-CM | POA: Diagnosis not present

## 2023-09-09 DIAGNOSIS — R197 Diarrhea, unspecified: Secondary | ICD-10-CM

## 2023-09-09 DIAGNOSIS — E8722 Chronic metabolic acidosis: Secondary | ICD-10-CM | POA: Diagnosis not present

## 2023-09-09 DIAGNOSIS — R112 Nausea with vomiting, unspecified: Secondary | ICD-10-CM | POA: Diagnosis present

## 2023-09-09 DIAGNOSIS — D509 Iron deficiency anemia, unspecified: Secondary | ICD-10-CM | POA: Diagnosis present

## 2023-09-09 DIAGNOSIS — E872 Acidosis, unspecified: Secondary | ICD-10-CM | POA: Diagnosis present

## 2023-09-09 DIAGNOSIS — E876 Hypokalemia: Secondary | ICD-10-CM | POA: Diagnosis not present

## 2023-09-09 DIAGNOSIS — E669 Obesity, unspecified: Secondary | ICD-10-CM | POA: Diagnosis present

## 2023-09-09 DIAGNOSIS — N289 Disorder of kidney and ureter, unspecified: Secondary | ICD-10-CM | POA: Diagnosis not present

## 2023-09-09 DIAGNOSIS — Z79899 Other long term (current) drug therapy: Secondary | ICD-10-CM | POA: Diagnosis not present

## 2023-09-09 DIAGNOSIS — E1165 Type 2 diabetes mellitus with hyperglycemia: Secondary | ICD-10-CM

## 2023-09-09 DIAGNOSIS — I10 Essential (primary) hypertension: Secondary | ICD-10-CM | POA: Diagnosis present

## 2023-09-09 DIAGNOSIS — I5030 Unspecified diastolic (congestive) heart failure: Secondary | ICD-10-CM | POA: Diagnosis present

## 2023-09-09 DIAGNOSIS — I130 Hypertensive heart and chronic kidney disease with heart failure and stage 1 through stage 4 chronic kidney disease, or unspecified chronic kidney disease: Secondary | ICD-10-CM | POA: Diagnosis not present

## 2023-09-09 LAB — COMPREHENSIVE METABOLIC PANEL WITH GFR
ALT: 12 U/L (ref 0–44)
AST: 16 U/L (ref 15–41)
Albumin: 4 g/dL (ref 3.5–5.0)
Alkaline Phosphatase: 91 U/L (ref 38–126)
Anion gap: 18 — ABNORMAL HIGH (ref 5–15)
BUN: 26 mg/dL — ABNORMAL HIGH (ref 6–20)
CO2: 17 mmol/L — ABNORMAL LOW (ref 22–32)
Calcium: 9.4 mg/dL (ref 8.9–10.3)
Chloride: 100 mmol/L (ref 98–111)
Creatinine, Ser: 2.24 mg/dL — ABNORMAL HIGH (ref 0.61–1.24)
GFR, Estimated: 37 mL/min — ABNORMAL LOW (ref >60)
Glucose, Bld: 93 mg/dL (ref 70–99)
Potassium: 3.2 mmol/L — ABNORMAL LOW (ref 3.5–5.1)
Sodium: 135 mmol/L (ref 135–145)
Total Bilirubin: 0.3 mg/dL (ref 0.0–1.2)
Total Protein: 7.9 g/dL (ref 6.5–8.1)

## 2023-09-09 LAB — C DIFFICILE QUICK SCREEN W PCR REFLEX
C Diff antigen: NEGATIVE
C Diff interpretation: NOT DETECTED
C Diff toxin: NEGATIVE

## 2023-09-09 LAB — CBC
HCT: 38.4 % — ABNORMAL LOW (ref 39.0–52.0)
Hemoglobin: 12.7 g/dL — ABNORMAL LOW (ref 13.0–17.0)
MCH: 23.3 pg — ABNORMAL LOW (ref 26.0–34.0)
MCHC: 33.1 g/dL (ref 30.0–36.0)
MCV: 70.3 fL — ABNORMAL LOW (ref 80.0–100.0)
Platelets: 286 10*3/uL (ref 150–400)
RBC: 5.46 MIL/uL (ref 4.22–5.81)
RDW: 15.6 % — ABNORMAL HIGH (ref 11.5–15.5)
WBC: 13.3 10*3/uL — ABNORMAL HIGH (ref 4.0–10.5)
nRBC: 0 % (ref 0.0–0.2)

## 2023-09-09 LAB — URINALYSIS, COMPLETE (UACMP) WITH MICROSCOPIC
Bilirubin Urine: NEGATIVE
Glucose, UA: NEGATIVE mg/dL
Ketones, ur: NEGATIVE mg/dL
Leukocytes,Ua: NEGATIVE
Nitrite: NEGATIVE
Protein, ur: NEGATIVE mg/dL
Specific Gravity, Urine: 1.005 (ref 1.005–1.030)
pH: 5 (ref 5.0–8.0)

## 2023-09-09 LAB — SEDIMENTATION RATE: Sed Rate: 13 mm/h (ref 0–16)

## 2023-09-09 LAB — HIV ANTIBODY (ROUTINE TESTING W REFLEX): HIV Screen 4th Generation wRfx: NONREACTIVE

## 2023-09-09 LAB — C-REACTIVE PROTEIN: CRP: 0.7 mg/dL (ref <1.0)

## 2023-09-09 LAB — MAGNESIUM: Magnesium: 1.3 mg/dL — ABNORMAL LOW (ref 1.7–2.4)

## 2023-09-09 MED ORDER — ATORVASTATIN CALCIUM 10 MG PO TABS
5.0000 mg | ORAL_TABLET | Freq: Every evening | ORAL | Status: DC
  Administered 2023-09-09: 5 mg via ORAL
  Filled 2023-09-09: qty 1

## 2023-09-09 MED ORDER — SODIUM CHLORIDE 0.9 % IV SOLN: INTRAVENOUS | Status: AC

## 2023-09-09 MED ORDER — POTASSIUM CHLORIDE 10 MEQ/100ML IV SOLN
10.0000 meq | INTRAVENOUS | Status: DC
  Administered 2023-09-09 (×2): 10 meq via INTRAVENOUS
  Filled 2023-09-09 (×2): qty 100

## 2023-09-09 MED ORDER — SODIUM CHLORIDE 0.9 % IV BOLUS
500.0000 mL | Freq: Once | INTRAVENOUS | Status: AC
  Administered 2023-09-09: 500 mL via INTRAVENOUS

## 2023-09-09 MED ORDER — CARVEDILOL 12.5 MG PO TABS
12.5000 mg | ORAL_TABLET | Freq: Two times a day (BID) | ORAL | Status: DC
  Administered 2023-09-09 – 2023-09-10 (×2): 12.5 mg via ORAL
  Filled 2023-09-09: qty 2
  Filled 2023-09-09: qty 1

## 2023-09-09 MED ORDER — POTASSIUM CHLORIDE CRYS ER 20 MEQ PO TBCR
40.0000 meq | EXTENDED_RELEASE_TABLET | ORAL | Status: AC
  Administered 2023-09-09: 40 meq via ORAL
  Filled 2023-09-09: qty 2

## 2023-09-09 MED ORDER — ENSURE ENLIVE PO LIQD: 237.0000 mL | Freq: Two times a day (BID) | ORAL | Status: DC

## 2023-09-09 MED ORDER — SODIUM CHLORIDE 0.9% FLUSH
3.0000 mL | Freq: Two times a day (BID) | INTRAVENOUS | Status: DC
Start: 2023-09-09 — End: 2023-09-10
  Administered 2023-09-09 – 2023-09-10 (×3): 3 mL via INTRAVENOUS

## 2023-09-09 MED ORDER — ONDANSETRON 4 MG PO TBDP
8.0000 mg | ORAL_TABLET | Freq: Once | ORAL | Status: DC
  Filled 2023-09-09: qty 2

## 2023-09-09 MED ORDER — ENOXAPARIN SODIUM 40 MG/0.4ML IJ SOSY
40.0000 mg | PREFILLED_SYRINGE | INTRAMUSCULAR | Status: DC
  Administered 2023-09-09: 40 mg via SUBCUTANEOUS
  Filled 2023-09-09 (×2): qty 0.4

## 2023-09-09 MED ORDER — SODIUM CHLORIDE 0.9 % IV SOLN: INTRAVENOUS | Status: DC

## 2023-09-09 MED ORDER — MAGNESIUM SULFATE 2 GM/50ML IV SOLN
2.0000 g | Freq: Once | INTRAVENOUS | Status: AC
  Administered 2023-09-09: 2 g via INTRAVENOUS
  Filled 2023-09-09: qty 50

## 2023-09-09 NOTE — ED Triage Notes (Addendum)
 Pt seen recently for same. REports now the diarrhea is continuing as well as vomiting all day today. Took zofran  about 10 minutes ago.  Pt reports he did go to work today as his work note had him to return today but he was unable to stay d/t vomiting. Pt reports he feels it is the mounjaro  causing his symptoms as he moved up in dosage 2 weeks ago.

## 2023-09-09 NOTE — H&P (Addendum)
 History and Physical    PatientRamir Reed JYN:[id number] DOB: 10/05/1985 DOA: 09/09/2023 DOS: the patient was seen and examined on 09/09/2023 PCP: Vernell Goldsmith, MD  Patient coming from: Home  Chief Complaint:  Chief Complaint  Patient presents with   Emesis   Diarrhea   HPI: Derek Reed is a 38 y.o. male with medical history significant of hypertension, hyperlipidemia, heart failure with preserved ejection fraction, diabetes mellitus type 2, chronic kidney disease stage IIIa end-of-life care presents with complaints of persistent nausea, vomiting, and diarrhea.  Patient reports having diarrhea over the last 1 and half weeks.  However, yesterday he developed nausea and vomiting.  Emesis was of what he had last eaten.  Denies any blood present in emesis or stools.  He has some achiness after vomiting and stomach.  Patient thinks symptoms started after his Mounjaro  dose was increased to 10 mg 2 weeks ago.  His last dose of the medicine was 3 days ago.  Previously had not had similar symptoms on the lower doses of the medicine.  Denies having any significant fever, shortness of breath, cough, dysuria, or leg swelling.  Has not eaten anything out of the norm and has not recently been on any antibiotics.  He does report that he is on iron  supplement and usually takes it every other day.  In the emergency department patient was noted to be afebrile with low normal blood pressures 98/82 -119/80, but otherwise stable vital signs.  Labs significant for WBC 13.3, hemoglobin 12.7, potassium 3.2, CO2 17, BUN 26, creatinine 2.24, and anion gap 18.  CT renal study revealed no acute abdominal pelvic findings.  Patient had been given Zofran , 500 mL of normal saline IV fluids, and potassium chloride  40 mEq IV.  Review of Systems: As mentioned in the history of present illness. All other systems reviewed and are negative. Past Medical History:  Diagnosis Date   Acute CHF (congestive heart failure) (HCC)  02/24/2019   Anxiety    CKD (chronic kidney disease) stage 2, GFR 60-89 ml/min    Diabetes mellitus without complication (HCC)    Based on A1C. Pt diet controlled.   Essential hypertension 02/24/2019   GAD (generalized anxiety disorder) 03/26/2014   Hyperlipidemia    Hypertension    Obesity    Peritonsillar abscess    Past Surgical History:  Procedure Laterality Date   CHOLECYSTECTOMY     CHOLECYSTECTOMY N/A 01/15/2023   Procedure: LAPAROSCOPIC CHOLECYSTECTOMY;  Surgeon: Dorena Gander, MD;  Location: [facility] OR;  Service: General;  Laterality: N/A;   INCISION AND DRAINAGE OF PERITONSILLAR ABCESS     RIGHT/LEFT HEART CATH AND CORONARY ANGIOGRAPHY N/A 02/24/2019   Procedure: RIGHT/LEFT HEART CATH AND CORONARY ANGIOGRAPHY;  Surgeon: Darlis Eisenmenger, MD;  Location: [facility] INVASIVE CV LAB;  Service: Cardiovascular;  Laterality: N/A;   Social History:  reports that he has quit smoking. He has never used smokeless tobacco. He reports that he does not drink alcohol and does not use drugs.  Allergies  Allergen Reactions   Egg-Derived Products Nausea And Vomiting    He just vomits eggs if he eats them but he eats many products that contain eggs without any problem 06/17/2023    Family History  Problem Relation Age of Onset   Hyperlipidemia Mother    Diabetes Mother    Stroke Mother    Hypertension Mother    Hypertension Father     Prior to Admission medications   Medication Sig Start Date End Date Taking?  Authorizing Provider  atorvastatin  (LIPITOR) 10 MG tablet Take 0.5 tablets (5 mg total) by mouth daily. 06/17/23   Hassie Lint, PA-C  Blood Pressure Monitor MISC Use to check blood pressure daily 03/10/23   Mayers, Cari S, PA-C  carvedilol  (COREG ) 12.5 MG tablet Take 1 tablet (12.5 mg total) by mouth 2 (two) times daily with a meal. 01/26/23   Vernell Goldsmith, MD  chlorthalidone  (HYGROTON ) 25 MG tablet Take 1 tablet (25 mg total) by mouth daily. 01/26/23   Vernell Goldsmith, MD   glipiZIDE  (GLUCOTROL ) 5 MG tablet Take 0.5 tablets (2.5 mg total) by mouth daily before breakfast. 06/17/23   Hassie Lint, PA-C  glucose blood (TRUE METRIX BLOOD GLUCOSE TEST) test strip Use to check blood sugar 2 (two) times daily as instructed. 03/10/23   Mayers, Cari S, PA-C  hydrALAZINE  (APRESOLINE ) 50 MG tablet Take 1 tablet (50 mg total) by mouth 3 (three) times daily. 08/27/23   Newlin, Enobong, MD  Iron , Ferrous Sulfate , 325 (65 Fe) MG TABS Take 1 tablet by mouth every other day. 07/12/23   Mayers, Cari S, PA-C  ondansetron  (ZOFRAN -ODT) 4 MG disintegrating tablet Take 1 tablet (4 mg total) by mouth every 8 (eight) hours as needed. 09/01/23   Kelsey Patricia, MD  potassium chloride  SA (KLOR-CON  M) 20 MEQ tablet Take 1 tablet (20 mEq total) by mouth 2 (two) times daily. 06/17/23   McClung, Angela M, PA-C  sacubitril -valsartan  (ENTRESTO ) 97-103 MG Take 1 tablet by mouth 2 (two) times daily. 01/26/23   Vernell Goldsmith, MD  sildenafil  (VIAGRA ) 50 MG tablet Take 1 tablet (50 mg total) by mouth daily as needed for erectile dysfunction. 07/07/23   Croitoru, Mihai, MD  spironolactone  (ALDACTONE ) 25 MG tablet Take 1 tablet (25 mg total) by mouth daily. 08/27/23   Newlin, Enobong, MD  tirzepatide  (MOUNJARO ) 10 MG/0.5ML Pen Inject 10 mg into the skin once a week. 08/23/23   Croitoru, Mihai, MD  tirzepatide  (MOUNJARO ) 7.5 MG/0.5ML Pen Inject 7.5 mg into the skin once a week. 08/23/23   Croitoru, Karyl Paget, MD    Physical Exam: Vitals:   09/09/23 0445 09/09/23 0449 09/09/23 0545 09/09/23 0624  BP: 115/82  111/75 106/70  Pulse: 99  88 88  Resp: 20   20  Temp:  98.3 F (36.8 C)  98.4 F (36.9 C)  TempSrc:  Oral  Oral  SpO2: 99%  96% 97%   Constitutional: Middle-age male who appears to be in no acute distress at this time. Eyes: PERRL, lids and conjunctivae normal ENMT: Mucous membranes are moist. Posterior pharynx clear of any exudate or lesions.Normal dentition.  Neck: normal, supple, no masses   Respiratory: clear to auscultation bilaterally, no wheezing, no crackles. Normal respiratory effort. No accessory muscle use.  Cardiovascular: Regular rate and rhythm, no murmurs / rubs / gallops. No extremity edema.   Abdomen: no tenderness, no masses palpated. No hepatosplenomegaly. Bowel sounds positive.  Musculoskeletal: no clubbing / cyanosis. No joint deformity upper and lower extremities. Good ROM, no contractures. Normal muscle tone.  Skin: no rashes, lesions, ulcers. No induration Neurologic: CN 2-12 grossly intact.  Strength 5/5 in all 4.  Psychiatric: Normal judgment and insight. Alert and oriented x 3. Normal mood.   Data Reviewed:   Assessment and Plan:  Nausea, vomiting, and diarrhea Acute.  Patient presents with complaints of nausea, vomiting, and diarrhea.  Diarrhea present on the last 1 and half weeks, but vomiting acute as of yesterday.  CT scan  of the abdomen pelvis did not show any acute abnormality.  Suspect symptoms could possibly be related to patient increasing his dose of Mounjaro  to 10 mg approximately 2 weeks ago as common side effects include nausea, vomiting, and diarrhea. - Admit to a MedSurg bed - Aspiration precautions with elevation head of the bed - Clear liquid diet and advance as tolerated - Check C. difficile and GI panel - Check ESR and CRP - Recommend patient follow-up with primary in regards to possibly decreasing his Mounjaro  dosing  Acute kidney injury superimposed on chronic kidney disease stage IIIa Creatinine noted to be elevated up to 2.24 with BUN 26.  Baseline creatinine previously noted to be around 1.5-1.8.  Patient has been given 500 mL bolus and placed on normal saline fluids - Strict I&Os - holding possible nephrotoxic agents including chlorthalidone , spironolactone , Entresto  - Continue IV fluids 125 mL/h - Recheck kidney function in a.m.  Hypokalemia Acute.  Potassium noted to be 3.2.  Thought secondary to nausea, vomiting, and  diarrhea.  Patient was given 15 mEq of potassium chloride  IV prior to medication being discontinued due to burning. - Give potassium chloride  40 mill equivalents p.o.  Iron  deficiency anemia Chronic.  Hemoglobin noted to be 12.7 with low MCV and MCH similar to prior.  RDW noted to be elevated at 15.6.  Previous iron  studies from 03/10/2023 significant for decreased iron  levels.   - Continue iron  supplementation - Recheck CBC tomorrow morning - Discussed possible need to be referred to gastroenterology for further workup of iron  deficiency  Essential hypertension Blood pressures noted to be initially as low as 103/56. - Holding home blood pressure regimen which includes chlorthalidone , spironolactone , and Entresto . - Plan to resume back Coreg  this evening if blood pressure stable  Controlled diabetes mellitus type 2, without long-term use of insulin  Last available hemoglobin A1c is 6.9 when checked on 01/26/2023.  Patient currently diet controlled and not on any medication for treatment.  Metabolic acidosis with elevated anion gap CO2 noted to be 17 with anion gap of 18.  Glucose noted to be within normal limits.  Unclear cause at this time. - IV fluids - Recheck electrolytes in a.m. and ensure has resolved  Heart failure with preserved ejection fraction Patient appears to be euvolemic on physical exam.  Records note EF was previously noted to be 25 to 30% when checked 02/2019. Most recent EF noted to be recovered at 55 to 60% with normal diastolic parameters 02/2021.   - Daily weights - Holding diuretics due to AKI  Hyperlipidemia - Continue atorvastatin   Obesity  BMI 39.53 kg/m.  Patient on Mounjaro  in the outpatient setting. - Follow-up with primary in regards to Mounjaro  dosing  DVT prophylaxis: Lovenox  Advance Care Planning:   Code Status: Full Code   Consults: None  Family Communication: None  Severity of Illness: The appropriate patient status for this patient is  OBSERVATION. Observation status is judged to be reasonable and necessary in order to provide the required intensity of service to ensure the patient's safety. The patient's presenting symptoms, physical exam findings, and initial radiographic and laboratory data in the context of their medical condition is felt to place them at decreased risk for further clinical deterioration. Furthermore, it is anticipated that the patient will be medically stable for discharge from the hospital within 2 midnights of admission.   Author: Lena Qualia, MD 09/09/2023 8:49 AM  For on call review [email or web address].

## 2023-09-09 NOTE — ED Notes (Signed)
 Received call from [facility] in regards to patient transport, No Current ETA. ED Nurse has paperwork Called @ 05:16 am

## 2023-09-09 NOTE — ED Provider Notes (Signed)
 [place] [facility] Provider Note   CSN: [id number] Arrival date & time: 09/08/23  2358     History  Chief Complaint  Patient presents with   Emesis   Diarrhea    Derek Reed is a 38 y.o. male.  The history is provided by the patient.  Emesis Severity:  Moderate Duration:  1 day Timing:  Intermittent Quality:  Stomach contents Progression:  Unchanged Chronicity:  Recurrent Recent urination:  Normal Context: not post-tussive   Relieved by:  Nothing Worsened by:  Nothing Associated symptoms: diarrhea   Associated symptoms: no fever   Diarrhea:    Quality:  Watery   Severity:  Moderate   Diarrhea duration: days. Risk factors comment:  Mounjaro  with recent increase Diarrhea Associated symptoms: vomiting   Associated symptoms: no fever   Patient with stage 2 CKD and obesity started on Mounjaro  with 3rd visit within a week for diarrhea and emesis.  Patient with recent increase in his dose.      Past Medical History:  Diagnosis Date   Acute CHF (congestive heart failure) (HCC) 02/24/2019   Anxiety    CKD (chronic kidney disease) stage 2, GFR 60-89 ml/min    Diabetes mellitus without complication (HCC)    Based on A1C. Pt diet controlled.   Essential hypertension 02/24/2019   GAD (generalized anxiety disorder) 03/26/2014   Hyperlipidemia    Hypertension    Obesity    Peritonsillar abscess      Home Medications Prior to Admission medications   Medication Sig Start Date End Date Taking? Authorizing Provider  atorvastatin  (LIPITOR) 10 MG tablet Take 0.5 tablets (5 mg total) by mouth daily. 06/17/23   Hassie Lint, PA-C  Blood Pressure Monitor MISC Use to check blood pressure daily 03/10/23   Mayers, Cari S, PA-C  carvedilol  (COREG ) 12.5 MG tablet Take 1 tablet (12.5 mg total) by mouth 2 (two) times daily with a meal. 01/26/23   Vernell Goldsmith, MD  chlorthalidone  (HYGROTON ) 25 MG tablet Take 1 tablet (25 mg total) by  mouth daily. 01/26/23   Vernell Goldsmith, MD  glipiZIDE  (GLUCOTROL ) 5 MG tablet Take 0.5 tablets (2.5 mg total) by mouth daily before breakfast. 06/17/23   Hassie Lint, PA-C  glucose blood (TRUE METRIX BLOOD GLUCOSE TEST) test strip Use to check blood sugar 2 (two) times daily as instructed. 03/10/23   Mayers, Cari S, PA-C  hydrALAZINE  (APRESOLINE ) 50 MG tablet Take 1 tablet (50 mg total) by mouth 3 (three) times daily. 08/27/23   Newlin, Enobong, MD  Iron , Ferrous Sulfate , 325 (65 Fe) MG TABS Take 1 tablet by mouth every other day. 07/12/23   Mayers, Cari S, PA-C  ondansetron  (ZOFRAN -ODT) 4 MG disintegrating tablet Take 1 tablet (4 mg total) by mouth every 8 (eight) hours as needed. 09/01/23   Kelsey Patricia, MD  potassium chloride  SA (KLOR-CON  M) 20 MEQ tablet Take 1 tablet (20 mEq total) by mouth 2 (two) times daily. 06/17/23   Hassie Lint, PA-C  sacubitril -valsartan  (ENTRESTO ) 97-103 MG Take 1 tablet by mouth 2 (two) times daily. 01/26/23   Vernell Goldsmith, MD  sildenafil  (VIAGRA ) 50 MG tablet Take 1 tablet (50 mg total) by mouth daily as needed for erectile dysfunction. 07/07/23   Croitoru, Mihai, MD  spironolactone  (ALDACTONE ) 25 MG tablet Take 1 tablet (25 mg total) by mouth daily. 08/27/23   Newlin, Enobong, MD  tirzepatide  (MOUNJARO ) 10 MG/0.5ML Pen Inject 10 mg into the skin  once a week. 08/23/23   Croitoru, Mihai, MD  tirzepatide  (MOUNJARO ) 7.5 MG/0.5ML Pen Inject 7.5 mg into the skin once a week. 08/23/23   Croitoru, Karyl Paget, MD      Allergies    Egg-derived products    Review of Systems   Review of Systems  Constitutional:  Negative for fever.  Gastrointestinal:  Positive for diarrhea, nausea and vomiting.  All other systems reviewed and are negative.   Physical Exam Updated Vital Signs BP 115/82 (BP Location: Right Arm)   Pulse 99   Temp 98.3 F (36.8 C) (Oral)   Resp 20   SpO2 99%  Physical Exam Vitals and nursing note reviewed.  Constitutional:      General: He is  not in acute distress.    Appearance: Normal appearance. He is well-developed. He is not diaphoretic.  HENT:     Head: Normocephalic and atraumatic.     Nose: Nose normal.  Eyes:     Conjunctiva/sclera: Conjunctivae normal.     Pupils: Pupils are equal, round, and reactive to light.  Cardiovascular:     Rate and Rhythm: Regular rhythm. Tachycardia present.     Pulses: Normal pulses.     Heart sounds: Normal heart sounds.  Pulmonary:     Effort: Pulmonary effort is normal.     Breath sounds: Normal breath sounds. No wheezing or rales.  Abdominal:     General: Bowel sounds are normal.     Palpations: Abdomen is soft.     Tenderness: There is no abdominal tenderness. There is no guarding or rebound.  Musculoskeletal:        General: Normal range of motion.     Cervical back: Normal range of motion and neck supple.  Skin:    General: Skin is warm and dry.     Capillary Refill: Capillary refill takes less than 2 seconds.  Neurological:     General: No focal deficit present.     Mental Status: He is alert and oriented to person, place, and time.     Deep Tendon Reflexes: Reflexes normal.  Psychiatric:        Mood and Affect: Mood normal.        Behavior: Behavior normal.     ED Results / Procedures / Treatments   Labs (all labs ordered are listed, but only abnormal results are displayed) Results for orders placed or performed during the hospital encounter of 09/09/23  Comprehensive metabolic panel   Collection Time: 09/09/23  1:03 AM  Result Value Ref Range   Sodium 135 135 - 145 mmol/L   Potassium 3.2 (L) 3.5 - 5.1 mmol/L   Chloride 100 98 - 111 mmol/L   CO2 17 (L) 22 - 32 mmol/L   Glucose, Bld 93 70 - 99 mg/dL   BUN 26 (H) 6 - 20 mg/dL   Creatinine, Ser 0.27 (H) 0.61 - 1.24 mg/dL   Calcium  9.4 8.9 - 10.3 mg/dL   Total Protein 7.9 6.5 - 8.1 g/dL   Albumin 4.0 3.5 - 5.0 g/dL   AST 16 15 - 41 U/L   ALT 12 0 - 44 U/L   Alkaline Phosphatase 91 38 - 126 U/L   Total  Bilirubin 0.3 0.0 - 1.2 mg/dL   GFR, Estimated 37 (L) >60 mL/min   Anion gap 18 (H) 5 - 15  CBC   Collection Time: 09/09/23  1:03 AM  Result Value Ref Range   WBC 13.3 (H) 4.0 - 10.5 K/uL  RBC 5.46 4.22 - 5.81 MIL/uL   Hemoglobin 12.7 (L) 13.0 - 17.0 g/dL   HCT 57.8 (L) 46.9 - 62.9 %   MCV 70.3 (L) 80.0 - 100.0 fL   MCH 23.3 (L) 26.0 - 34.0 pg   MCHC 33.1 30.0 - 36.0 g/dL   RDW 52.8 (H) 41.3 - 24.4 %   Platelets 286 150 - 400 K/uL   nRBC 0.0 0.0 - 0.2 %   CT Renal Stone Study Result Date: 09/09/2023 CLINICAL DATA:  Diarrhea, vomiting abdominal pain flank EXAM: CT ABDOMEN AND PELVIS WITHOUT CONTRAST TECHNIQUE: Multidetector CT imaging of the abdomen and pelvis was performed following the standard protocol without IV contrast. RADIATION DOSE REDUCTION: This exam was performed according to the departmental dose-optimization program which includes automated exposure control, adjustment of the mA and/or kV according to patient size and/or use of iterative reconstruction technique. COMPARISON:  11/03/2022 FINDINGS: Lower chest: No acute abnormality. Hepatobiliary: No focal liver abnormality is seen. Status post cholecystectomy. No biliary dilatation. Pancreas: Unremarkable Spleen: Unremarkable Adrenals/Urinary Tract: Adrenal glands are unremarkable. Kidneys are normal, without renal calculi, focal lesion, or hydronephrosis. Bladder is unremarkable. Stomach/Bowel: Stomach is within normal limits. Appendix appears normal. No evidence of bowel wall thickening, distention, or inflammatory changes. Vascular/Lymphatic: No significant vascular findings are present. No enlarged abdominal or pelvic lymph nodes. Reproductive: Prostate is unremarkable. Other: Tiny fat containing umbilical hernia. No abdominopelvic ascites. Musculoskeletal: No acute or significant osseous findings. IMPRESSION: 1. No acute abdominopelvic findings. Electronically Signed   By: Worthy Heads M.D.   On: 09/09/2023 01:58     Radiology CT Renal Stone Study Result Date: 09/09/2023 CLINICAL DATA:  Diarrhea, vomiting abdominal pain flank EXAM: CT ABDOMEN AND PELVIS WITHOUT CONTRAST TECHNIQUE: Multidetector CT imaging of the abdomen and pelvis was performed following the standard protocol without IV contrast. RADIATION DOSE REDUCTION: This exam was performed according to the departmental dose-optimization program which includes automated exposure control, adjustment of the mA and/or kV according to patient size and/or use of iterative reconstruction technique. COMPARISON:  11/03/2022 FINDINGS: Lower chest: No acute abnormality. Hepatobiliary: No focal liver abnormality is seen. Status post cholecystectomy. No biliary dilatation. Pancreas: Unremarkable Spleen: Unremarkable Adrenals/Urinary Tract: Adrenal glands are unremarkable. Kidneys are normal, without renal calculi, focal lesion, or hydronephrosis. Bladder is unremarkable. Stomach/Bowel: Stomach is within normal limits. Appendix appears normal. No evidence of bowel wall thickening, distention, or inflammatory changes. Vascular/Lymphatic: No significant vascular findings are present. No enlarged abdominal or pelvic lymph nodes. Reproductive: Prostate is unremarkable. Other: Tiny fat containing umbilical hernia. No abdominopelvic ascites. Musculoskeletal: No acute or significant osseous findings. IMPRESSION: 1. No acute abdominopelvic findings. Electronically Signed   By: Worthy Heads M.D.   On: 09/09/2023 01:58    Procedures Procedures    Medications Ordered in ED Medications  ondansetron  (ZOFRAN -ODT) disintegrating tablet 8 mg (8 mg Oral Patient Refused/Not Given 09/09/23 0119)  0.9 %  sodium chloride  infusion ( Intravenous New Bag/Given 09/09/23 0226)  sodium chloride  0.9 % bolus 500 mL (0 mLs Intravenous Stopped 09/09/23 0222)    ED Course/ Medical Decision Making/ A&P                                 Medical Decision Making Patient with CKD on Mounjaro  with n/v/d.     Amount and/or Complexity of Data Reviewed External Data Reviewed: labs and notes.    Details: Previous ED visits reviewed  Labs: ordered.  Details: White count slight elevation 13.3, hemoglobin slight low 12.7, normal platelets.  Normal sodium 135, slight low potassium 3.2, AKI 2.24  Radiology: ordered and independent interpretation performed.    Details: Negative CT by me   Risk Prescription drug management. Decision regarding hospitalization. Risk Details: Patient's creatinine is up markedly from baseline.  This is likely dehydration secondary to side effects from Mounjaro .      Final Clinical Impression(s) / ED Diagnoses Final diagnoses:  Nausea vomiting and diarrhea  Adverse effect of drug, sequela  Renal insufficiency  AKI (acute kidney injury) (HCC)   The patient appears reasonably stabilized for admission considering the current resources, flow, and capabilities available in the ED at this time, and I doubt any other [facility] requiring further screening and/or treatment in the ED prior to admission.  Rx / DC Orders ED Discharge Orders     None         Emmanuella Mirante, MD 09/09/23 [phone]

## 2023-09-09 NOTE — ED Notes (Signed)
 Report given to Pooja, RN at [facility].

## 2023-09-09 NOTE — Progress Notes (Signed)
   09/09/23 1443  TOC Brief Assessment  Insurance and Status Reviewed Counselling psychologist Managed)  Patient has primary care physician Yes Brent Cambric, Scherrie Curt, MD)  Home environment has been reviewed from home with Spouse  Prior level of function: independent  Prior/Current Home Services No current home services  Social Drivers of Health Review SDOH reviewed no interventions necessary  Readmission risk has been reviewed Yes (N/A)  Transition of care needs no transition of care needs at this time   Patient here for Nausea and diarrhea. Recent increase in dose of Terzepatide.  Following for [facility] needs

## 2023-09-09 NOTE — Progress Notes (Signed)
   09/09/23 1100  Spiritual Encounters  Type of Visit Initial  Care provided to: Patient  Referral source Patient request  Reason for visit Advance directives   Chaplain responded to consult request for HCPOA. Patient stated that they had notarized Advance Care Directives document last year. He asked if it needed to do again. Chaplain explained that if he hadn't changed his mind about anything, it would not be necessary to do it again. Chaplain will be available if needed.   Dartha Engel Chaplain Resident [phone]

## 2023-09-10 ENCOUNTER — Other Ambulatory Visit (HOSPITAL_COMMUNITY): Payer: Self-pay

## 2023-09-10 ENCOUNTER — Other Ambulatory Visit: Payer: Self-pay

## 2023-09-10 DIAGNOSIS — N179 Acute kidney failure, unspecified: Secondary | ICD-10-CM | POA: Diagnosis not present

## 2023-09-10 DIAGNOSIS — R112 Nausea with vomiting, unspecified: Secondary | ICD-10-CM | POA: Diagnosis not present

## 2023-09-10 DIAGNOSIS — E876 Hypokalemia: Secondary | ICD-10-CM | POA: Diagnosis not present

## 2023-09-10 DIAGNOSIS — D509 Iron deficiency anemia, unspecified: Secondary | ICD-10-CM | POA: Diagnosis not present

## 2023-09-10 LAB — BASIC METABOLIC PANEL WITH GFR
Anion gap: 10 (ref 5–15)
BUN: 17 mg/dL (ref 6–20)
CO2: 22 mmol/L (ref 22–32)
Calcium: 8.3 mg/dL — ABNORMAL LOW (ref 8.9–10.3)
Chloride: 105 mmol/L (ref 98–111)
Creatinine, Ser: 1.95 mg/dL — ABNORMAL HIGH (ref 0.61–1.24)
GFR, Estimated: 44 mL/min — ABNORMAL LOW (ref >60)
Glucose, Bld: 96 mg/dL (ref 70–99)
Potassium: 3.1 mmol/L — ABNORMAL LOW (ref 3.5–5.1)
Sodium: 137 mmol/L (ref 135–145)

## 2023-09-10 LAB — GASTROINTESTINAL PANEL BY PCR, STOOL (REPLACES STOOL CULTURE)

## 2023-09-10 LAB — CBC
HCT: 33.2 % — ABNORMAL LOW (ref 39.0–52.0)
Hemoglobin: 10.9 g/dL — ABNORMAL LOW (ref 13.0–17.0)
MCH: 23.2 pg — ABNORMAL LOW (ref 26.0–34.0)
MCHC: 32.8 g/dL (ref 30.0–36.0)
MCV: 70.6 fL — ABNORMAL LOW (ref 80.0–100.0)
Platelets: 271 10*3/uL (ref 150–400)
RBC: 4.7 MIL/uL (ref 4.22–5.81)
RDW: 15.4 % (ref 11.5–15.5)
WBC: 13.9 10*3/uL — ABNORMAL HIGH (ref 4.0–10.5)
nRBC: 0 % (ref 0.0–0.2)

## 2023-09-10 LAB — MAGNESIUM: Magnesium: 1.8 mg/dL (ref 1.7–2.4)

## 2023-09-10 MED ORDER — LOPERAMIDE HCL 2 MG PO TABS
2.0000 mg | ORAL_TABLET | Freq: Four times a day (QID) | ORAL | 0 refills | Status: DC | PRN
  Filled 2023-09-10: qty 15, 4d supply, fill #0

## 2023-09-10 MED ORDER — POTASSIUM CHLORIDE CRYS ER 20 MEQ PO TBCR
40.0000 meq | EXTENDED_RELEASE_TABLET | Freq: Once | ORAL | Status: AC
  Administered 2023-09-10: 40 meq via ORAL
  Filled 2023-09-10: qty 2

## 2023-09-10 MED ORDER — ONDANSETRON 4 MG PO TBDP
4.0000 mg | ORAL_TABLET | Freq: Three times a day (TID) | ORAL | 0 refills | Status: DC | PRN
  Filled 2023-09-10: qty 15, 5d supply, fill #0

## 2023-09-10 NOTE — TOC Transition Note (Signed)
 Transition of Care [facility]) - Discharge Note   Patient Details  Name: Derek Reed MRN: [id number] Date of Birth: 02/08/1986  Transition of [facility]) CM/SW Contact:  Eusebio High, RN Phone Number: 09/10/2023, 12:43 PM   Clinical Narrative:     Patient will DC to home with Wife today. No TOC needs identified and patient will follow up as directed on AVS.           Patient Goals and CMS Choice            Discharge Placement                       Discharge Plan and Services Additional resources added to the After Visit Summary for                                       Social Drivers of Health (SDOH) Interventions SDOH Screenings   Food Insecurity: No Food Insecurity (09/09/2023)  Housing: Low Risk  (09/09/2023)  Transportation Needs: No Transportation Needs (09/09/2023)  Utilities: Not At Risk (09/09/2023)  Alcohol Screen: Low Risk  (06/17/2023)  Depression (PHQ2-9): Low Risk  (06/17/2023)  Financial Resource Strain: Medium Risk (06/17/2023)  Physical Activity: Inactive (06/17/2023)  Social Connections: Socially Isolated (06/17/2023)  Stress: No Stress Concern Present (06/17/2023)  Tobacco Use: Medium Risk (09/09/2023)  Health Literacy: Adequate Health Literacy (06/17/2023)     Readmission Risk Interventions     No data to display

## 2023-09-10 NOTE — Plan of Care (Signed)
   Problem: Clinical Measurements: Goal: Ability to maintain clinical measurements within normal limits will improve Outcome: Progressing Goal: Will remain free from infection Outcome: Progressing   Problem: Coping: Goal: Level of anxiety will decrease Outcome: Progressing

## 2023-09-10 NOTE — Discharge Summary (Signed)
 PATIENT DETAILS Name: Derek Reed Age: 38 y.o. Sex: male Date of Birth: 1985-10-04 MRN: [id number]. Admitting Physician: Bary Boss, DO [id number], Scherrie Curt, MD  Admit Date: 09/09/2023 Discharge date: 09/10/2023  Recommendations for Outpatient Follow-up:  Follow up with PCP in 1-2 weeks Please obtain CMP/CBC in one week Entresto /Aldactone /chlorthalidone  held-patient has a follow-up appointment with PCP on 5/6-check blood pressure and electrolytes that visit-if okay then resume held medications. Patient will discuss with PCP before resuming Mounjaro .   Admitted From:  Home  Disposition: Home   Discharge Condition: good  CODE STATUS:   Code Status: Full Code   Diet recommendation:  Diet Order             Diet - low sodium heart healthy           Diet Carb Modified           Diet regular Room service appropriate? Yes; Fluid consistency: Thin  Diet effective now                    Brief Summary: 38 year old patient with nonischemic cardiomyopathy with recovered ejection fraction, CKD stage III AA, DM-2 who presented with nausea, vomiting, and diarrhea.  He was found to have AKI and subsequently admitted to the hospitalist service  Significant studies 5/1>> CT abdomen/pelvis: No acute findings 5/1>> stool C. difficile/stool GI pathogen panel: Negative  Brief Hospital Course: Nausea/vomiting/diarrhea Unclear whether this was due to a viral syndrome (no sick contacts)-or from recent dose increase of Mounjaro  He was managed with supportive care Nausea/vomiting and diarrhea essentially resolved Stable for discharge today  AKI on CKD stage IIIa AKI likely hemodynamically mediated in the setting of GI loss-Entresto /diuretic use AKI improved with supportive care-creatinine now close to baseline Continue to hold chlorthalidone /Entresto /Aldactone -until seen by PCP-per patient-he has a appointment with PCP on 5/6-based on blood pressure/electrolytes during that  visit-above medications can be resumed.  Hypokalemia Will be repleted prior to discharge.  Nonischemic cardiomyopathy HFrEF with recovered ejection fraction Euvolemic on exam Continue beta-blocker See plan to resume chlorthalidone /Entresto /Aldactone  on 5/6 after PCP evaluation-electrolyte recheck.    DM-2 On Mounjaro  for weekly injections He will follow-up with his PCP whether or not to continue this medication.  Microcytic anemia Chronic issue Slight drop in hemoglobin due to IV fluid dilution Stable for continued monitoring/management in the outpatient setting.  PCP to consider referring patient to GI.    Class 2 obesity: Estimated body mass index is 39.57 kg/m as calculated from the following:   Height as of 09/03/23: 5\' 6"  (1.676 m).   Weight as of this encounter: 111.2 kg.   Discharge Diagnoses:  Principal Problem:   Acute kidney injury superimposed on chronic kidney disease (HCC) Active Problems:   Nausea, vomiting, and diarrhea   Hypokalemia   Iron  deficiency anemia   Essential hypertension   Controlled type 2 diabetes mellitus with stage 3 chronic kidney disease, without long-term current use of insulin  (HCC)   Metabolic acidosis with increased anion gap and reduced excretion of inorganic acids   Heart failure with preserved ejection fraction (HCC)   Mixed hyperlipidemia   Obesity (BMI 30-39.9)   Discharge Instructions:  Activity:  As tolerated  Discharge Instructions     (HEART FAILURE PATIENTS) Call MD:  Anytime you have any of the following symptoms: 1) 3 pound weight gain in 24 hours or 5 pounds in 1 week 2) shortness of breath, with or without a dry hacking cough 3) swelling in the hands,  feet or stomach 4) if you have to sleep on extra pillows at night in order to breathe.   Complete by: As directed    Call MD for:  difficulty breathing, headache or visual disturbances   Complete by: As directed    Call MD for:  persistant nausea and vomiting   Complete  by: As directed    Diet - low sodium heart healthy   Complete by: As directed    Diet Carb Modified   Complete by: As directed    Discharge instructions   Complete by: As directed    Follow with Primary MD  Vernell Goldsmith, MD in 1-2 weeks  Please get a complete blood count and chemistry panel checked by your Primary MD at your next visit, and again as instructed by your Primary MD.  Get Medicines reviewed and adjusted: Please take all your medications with you for your next visit with your Primary MD  Laboratory/radiological data: Please request your Primary MD to go over all hospital tests and procedure/radiological results at the follow up, please ask your Primary MD to get all Hospital records sent to his/her office.  In some cases, they will be blood work, cultures and biopsy results pending at the time of your discharge. Please request that your primary care M.D. follows up on these results.  Also Note the following: If you experience worsening of your admission symptoms, develop shortness of breath, life threatening emergency, suicidal or homicidal thoughts you must seek medical attention immediately by calling 911 or calling your MD immediately  if symptoms less severe.  You must read complete instructions/literature along with all the possible adverse reactions/side effects for all the Medicines you take and that have been prescribed to you. Take any new Medicines after you have completely understood and accpet all the possible adverse reactions/side effects.   Do not drive when taking Pain medications or sleeping medications (Benzodaizepines)  Do not take more than prescribed Pain, Sleep and Anxiety Medications. It is not advisable to combine anxiety,sleep and pain medications without talking with your primary care practitioner  Special Instructions: If you have smoked or chewed Tobacco  in the last 2 yrs please stop smoking, stop any regular Alcohol  and or any Recreational  drug use.  Wear Seat belts while driving.  Please note: You were cared for by a hospitalist during your hospital stay. Once you are discharged, your primary care physician will handle any further medical issues. Please note that NO REFILLS for any discharge medications will be authorized once you are discharged, as it is imperative that you return to your primary care physician (or establish a relationship with a primary care physician if you do not have one) for your post hospital discharge needs so that they can reassess your need for medications and monitor your lab values.   Increase activity slowly   Complete by: As directed       Allergies as of 09/10/2023       Reactions   Egg-derived Products Nausea And Vomiting   He just vomits eggs if he eats them but he eats many products that contain eggs without any problem 06/17/2023        Medication List     PAUSE taking these medications    chlorthalidone  25 MG tablet Wait to take this until your doctor or other care provider tells you to start again. Commonly known as: HYGROTON  Take 1 tablet (25 mg total) by mouth daily.   Entresto  97-103  MG Wait to take this until your doctor or other care provider tells you to start again. Generic drug: sacubitril -valsartan  Take 1 tablet by mouth 2 (two) times daily.   Mounjaro  10 MG/0.5ML Pen Wait to take this until your doctor or other care provider tells you to start again. Generic drug: tirzepatide  Inject 10 mg into the skin once a week.   spironolactone  25 MG tablet Wait to take this until your doctor or other care provider tells you to start again. Commonly known as: ALDACTONE  Take 1 tablet (25 mg total) by mouth daily.       TAKE these medications    atorvastatin  10 MG tablet Commonly known as: LIPITOR Take 0.5 tablets (5 mg total) by mouth daily.   carvedilol  12.5 MG tablet Commonly known as: COREG  Take 1 tablet (12.5 mg total) by mouth 2 (two) times daily with a meal.    FeroSul 325 (65 Fe) MG tablet Generic drug: ferrous sulfate  Take 1 tablet by mouth every other day.   hydrALAZINE  50 MG tablet Commonly known as: APRESOLINE  Take 1 tablet (50 mg total) by mouth 3 (three) times daily. What changed: when to take this   loperamide  2 MG tablet Commonly known as: Imodium  A-D Take 1 tablet (2 mg total) by mouth 4 (four) times daily as needed for diarrhea or loose stools.   ondansetron  4 MG disintegrating tablet Commonly known as: ZOFRAN -ODT Take 1 tablet (4 mg total) by mouth every 8 (eight) hours as needed for nausea, vomiting or refractory nausea / vomiting.   potassium chloride  SA 20 MEQ tablet Commonly known as: KLOR-CON  M Take 1 tablet (20 mEq total) by mouth 2 (two) times daily.   sildenafil  50 MG tablet Commonly known as: VIAGRA  Take 1 tablet (50 mg total) by mouth daily as needed for erectile dysfunction.        Follow-up Information     Vernell Goldsmith, MD. Schedule an appointment as soon as possible for a visit on 09/14/2023.   Specialty: Pulmonary Disease Why: Keep scheduled appointment Contact information: [address] [place] [address] [phone]         [place], [place]. Schedule an appointment as soon as possible for a visit in 2 week(s).   Contact information: [address] [place] [address] [phone]                Allergies  Allergen Reactions   Egg-Derived Products Nausea And Vomiting    He just vomits eggs if he eats them but he eats many products that contain eggs without any problem 06/17/2023     Other Procedures/Studies: CT Renal Stone Study Result Date: 09/09/2023 CLINICAL DATA:  Diarrhea, vomiting abdominal pain flank EXAM: CT ABDOMEN AND PELVIS WITHOUT CONTRAST TECHNIQUE: Multidetector CT imaging of the abdomen and pelvis was performed following the standard protocol without IV contrast. RADIATION DOSE REDUCTION: This exam was performed according to the  departmental dose-optimization program which includes automated exposure control, adjustment of the mA and/or kV according to patient size and/or use of iterative reconstruction technique. COMPARISON:  11/03/2022 FINDINGS: Lower chest: No acute abnormality. Hepatobiliary: No focal liver abnormality is seen. Status post cholecystectomy. No biliary dilatation. Pancreas: Unremarkable Spleen: Unremarkable Adrenals/Urinary Tract: Adrenal glands are unremarkable. Kidneys are normal, without renal calculi, focal lesion, or hydronephrosis. Bladder is unremarkable. Stomach/Bowel: Stomach is within normal limits. Appendix appears normal. No evidence of bowel wall thickening, distention, or inflammatory changes. Vascular/Lymphatic: No significant vascular findings are present.  No enlarged abdominal or pelvic lymph nodes. Reproductive: Prostate is unremarkable. Other: Tiny fat containing umbilical hernia. No abdominopelvic ascites. Musculoskeletal: No acute or significant osseous findings. IMPRESSION: 1. No acute abdominopelvic findings. Electronically Signed   By: Worthy Heads M.D.   On: 09/09/2023 01:58     TODAY-DAY OF DISCHARGE:  Subjective:   Derek Reed today has no headache,no chest abdominal pain,no new weakness tingling or numbness, feels much better wants to go home today.   Objective:   Blood pressure (!) 123/90, pulse 74, temperature 98.4 F (36.9 C), temperature source Oral, resp. rate 17, weight 111.2 kg, SpO2 98%.  Intake/Output Summary (Last 24 hours) at 09/10/2023 1210 Last data filed at 09/10/2023 0900 Gross per 24 hour  Intake 243 ml  Output 450 ml  Net -207 ml   Filed Weights   09/10/23 0500  Weight: 111.2 kg    Exam: Awake Alert, Oriented *3, No new F.N deficits, Normal affect [place].AT,PERRAL Supple Neck,No JVD, No cervical lymphadenopathy appriciated.  Symmetrical Chest wall movement, Good air movement bilaterally, CTAB RRR,No Gallops,Rubs or new Murmurs, No Parasternal  Heave +ve B.Sounds, Abd Soft, Non tender, No organomegaly appriciated, No rebound -guarding or rigidity. No Cyanosis, Clubbing or edema, No new Rash or bruise   PERTINENT RADIOLOGIC STUDIES: CT Renal Stone Study Result Date: 09/09/2023 CLINICAL DATA:  Diarrhea, vomiting abdominal pain flank EXAM: CT ABDOMEN AND PELVIS WITHOUT CONTRAST TECHNIQUE: Multidetector CT imaging of the abdomen and pelvis was performed following the standard protocol without IV contrast. RADIATION DOSE REDUCTION: This exam was performed according to the departmental dose-optimization program which includes automated exposure control, adjustment of the mA and/or kV according to patient size and/or use of iterative reconstruction technique. COMPARISON:  11/03/2022 FINDINGS: Lower chest: No acute abnormality. Hepatobiliary: No focal liver abnormality is seen. Status post cholecystectomy. No biliary dilatation. Pancreas: Unremarkable Spleen: Unremarkable Adrenals/Urinary Tract: Adrenal glands are unremarkable. Kidneys are normal, without renal calculi, focal lesion, or hydronephrosis. Bladder is unremarkable. Stomach/Bowel: Stomach is within normal limits. Appendix appears normal. No evidence of bowel wall thickening, distention, or inflammatory changes. Vascular/Lymphatic: No significant vascular findings are present. No enlarged abdominal or pelvic lymph nodes. Reproductive: Prostate is unremarkable. Other: Tiny fat containing umbilical hernia. No abdominopelvic ascites. Musculoskeletal: No acute or significant osseous findings. IMPRESSION: 1. No acute abdominopelvic findings. Electronically Signed   By: Worthy Heads M.D.   On: 09/09/2023 01:58     PERTINENT LAB RESULTS: CBC: Recent Labs    09/09/23 0103 09/10/23 0426  WBC 13.3* 13.9*  HGB 12.7* 10.9*  HCT 38.4* 33.2*  PLT 286 271   CMET CMP     Component Value Date/Time   NA 137 09/10/2023 0426   NA 138 03/10/2023 1004   K 3.1 (L) 09/10/2023 0426   CL 105  09/10/2023 0426   CO2 22 09/10/2023 0426   GLUCOSE 96 09/10/2023 0426   BUN 17 09/10/2023 0426   BUN 18 03/10/2023 1004   CREATININE 1.95 (H) 09/10/2023 0426   CREATININE 1.21 03/26/2014 1656   CALCIUM  8.3 (L) 09/10/2023 0426   PROT 7.9 09/09/2023 0103   PROT 6.9 03/10/2023 1004   ALBUMIN 4.0 09/09/2023 0103   ALBUMIN 4.0 (L) 03/10/2023 1004   AST 16 09/09/2023 0103   ALT 12 09/09/2023 0103   ALKPHOS 91 09/09/2023 0103   BILITOT 0.3 09/09/2023 0103   BILITOT 0.3 03/10/2023 1004   EGFR 56 (L) 03/10/2023 1004   GFRNONAA 44 (L) 09/10/2023 0426    GFR Estimated  Creatinine Clearance: 59 mL/min (A) (by C-G formula based on SCr of 1.95 mg/dL (H)). No results for input(s): "LIPASE", "AMYLASE" in the last 72 hours. No results for input(s): "CKTOTAL", "CKMB", "CKMBINDEX", "TROPONINI" in the last 72 hours. Invalid input(s): "POCBNP" No results for input(s): "DDIMER" in the last 72 hours. No results for input(s): "HGBA1C" in the last 72 hours. No results for input(s): "CHOL", "HDL", "LDLCALC", "TRIG", "CHOLHDL", "LDLDIRECT" in the last 72 hours. No results for input(s): "TSH", "T4TOTAL", "T3FREE", "THYROIDAB" in the last 72 hours.  Invalid input(s): "FREET3" No results for input(s): "VITAMINB12", "FOLATE", "FERRITIN", "TIBC", "IRON ", "RETICCTPCT" in the last 72 hours. Coags: No results for input(s): "INR" in the last 72 hours.  Invalid input(s): "PT" Microbiology: Recent Results (from the past 240 hours)  Gastrointestinal Panel by PCR , Stool     Status: None   Collection Time: 09/09/23  6:46 AM   Specimen: STOOL  Result Value Ref Range Status   Campylobacter species NOT DETECTED NOT DETECTED Final   Plesimonas shigelloides NOT DETECTED NOT DETECTED Final   Salmonella species NOT DETECTED NOT DETECTED Final   Yersinia enterocolitica NOT DETECTED NOT DETECTED Final   Vibrio species NOT DETECTED NOT DETECTED Final   Vibrio cholerae NOT DETECTED NOT DETECTED Final    Enteroaggregative E coli (EAEC) NOT DETECTED NOT DETECTED Final   Enteropathogenic E coli (EPEC) NOT DETECTED NOT DETECTED Final   Enterotoxigenic E coli (ETEC) NOT DETECTED NOT DETECTED Final   Shiga like toxin producing E coli (STEC) NOT DETECTED NOT DETECTED Final   Shigella/Enteroinvasive E coli (EIEC) NOT DETECTED NOT DETECTED Final   Cryptosporidium NOT DETECTED NOT DETECTED Final   Cyclospora cayetanensis NOT DETECTED NOT DETECTED Final   Entamoeba histolytica NOT DETECTED NOT DETECTED Final   Giardia lamblia NOT DETECTED NOT DETECTED Final   Adenovirus F40/41 NOT DETECTED NOT DETECTED Final   Astrovirus NOT DETECTED NOT DETECTED Final   Norovirus GI/GII NOT DETECTED NOT DETECTED Final   Rotavirus A NOT DETECTED NOT DETECTED Final   Sapovirus (I, II, IV, and V) NOT DETECTED NOT DETECTED Final    Comment: Performed at [facility], [address] [place], [place] [address]  C Difficile Quick Screen w PCR reflex     Status: None   Collection Time: 09/09/23  8:50 AM   Specimen: STOOL  Result Value Ref Range Status   C Diff antigen NEGATIVE NEGATIVE Final   C Diff toxin NEGATIVE NEGATIVE Final   C Diff interpretation No C. difficile detected.  Final    Comment: Performed at [facility], [address], [place], [place] [address]    FURTHER DISCHARGE INSTRUCTIONS:  Get Medicines reviewed and adjusted: Please take all your medications with you for your next visit with your Primary MD  Laboratory/radiological data: Please request your Primary MD to go over all hospital tests and procedure/radiological results at the follow up, please ask your Primary MD to get all Hospital records sent to his/her office.  In some cases, they will be blood work, cultures and biopsy results pending at the time of your discharge. Please request that your primary care M.D. goes through all the records of your hospital data and follows up on these results.  Also Note the following: If  you experience worsening of your admission symptoms, develop shortness of breath, life threatening emergency, suicidal or homicidal thoughts you must seek medical attention immediately by calling 911 or calling your MD immediately  if symptoms less severe.  You must read complete instructions/literature along with all the possible adverse reactions/side effects for all the Medicines you take and that have been prescribed to you. Take any new Medicines after you have completely understood and accpet all the possible adverse reactions/side effects.   Do not drive when taking Pain medications or sleeping medications (Benzodaizepines)  Do not take more than prescribed Pain, Sleep and Anxiety Medications. It is not advisable to combine anxiety,sleep and pain medications without talking with your primary care practitioner  Special Instructions: If you have smoked or chewed Tobacco  in the last 2 yrs please stop smoking, stop any regular Alcohol  and or any Recreational drug use.  Wear Seat belts while driving.  Please note: You were cared for by a hospitalist during your hospital stay. Once you are discharged, your primary care physician will handle any further medical issues. Please note that NO REFILLS for any discharge medications will be authorized once you are discharged, as it is imperative that you return to your primary care physician (or establish a relationship with a primary care physician if you do not have one) for your post hospital discharge needs so that they can reassess your need for medications and monitor your lab values.  Total Time spent coordinating discharge including counseling, education and face to face time equals greater than 30 minutes.  SignedKimberly Penna 09/10/2023 12:10 PM

## 2023-09-10 NOTE — Plan of Care (Signed)

## 2023-09-11 LAB — CALPROTECTIN, FECAL: Calprotectin, Fecal: 608 ug/g — ABNORMAL HIGH (ref 0–120)

## 2023-09-13 ENCOUNTER — Other Ambulatory Visit: Payer: Self-pay

## 2023-09-13 ENCOUNTER — Other Ambulatory Visit (HOSPITAL_COMMUNITY): Payer: Self-pay

## 2023-09-13 ENCOUNTER — Encounter: Payer: Self-pay | Admitting: Family Medicine

## 2023-09-13 ENCOUNTER — Telehealth: Payer: Self-pay

## 2023-09-13 NOTE — Transitions of Care (Post Inpatient/ED Visit) (Signed)
   09/13/2023  Name: Derek Reed MRN: [id number] DOB: 15-Feb-1986  Today's TOC FU Call Status: Today's TOC FU Call Status:: Unsuccessful Call (1st Attempt) Unsuccessful Call (1st Attempt) Date: 09/13/23  Attempted to reach the patient regarding the most recent Inpatient/ED visit.  Follow Up Plan: Additional outreach attempts will be made to reach the patient to complete the Transitions of Care (Post Inpatient/ED visit) call.   The patient has an appointment with Dr Adan Holms at [facility] tomorrow, 09/14/2023.   Signature Burnett Carson, RN

## 2023-09-13 NOTE — Telephone Encounter (Signed)
 Patient has appointment tomorrow in office, can he wait until his appointment.

## 2023-09-14 ENCOUNTER — Other Ambulatory Visit: Payer: Self-pay

## 2023-09-14 ENCOUNTER — Telehealth: Payer: Self-pay

## 2023-09-14 ENCOUNTER — Ambulatory Visit: Payer: Managed Care, Other (non HMO) | Attending: Family Medicine | Admitting: Family Medicine

## 2023-09-14 ENCOUNTER — Encounter: Payer: Self-pay | Admitting: Family Medicine

## 2023-09-14 ENCOUNTER — Other Ambulatory Visit (HOSPITAL_COMMUNITY): Payer: Self-pay

## 2023-09-14 VITALS — BP 103/70 | HR 73 | Ht 66.0 in | Wt 242.2 lb

## 2023-09-14 DIAGNOSIS — E119 Type 2 diabetes mellitus without complications: Secondary | ICD-10-CM

## 2023-09-14 DIAGNOSIS — Z7985 Long-term (current) use of injectable non-insulin antidiabetic drugs: Secondary | ICD-10-CM | POA: Diagnosis not present

## 2023-09-14 DIAGNOSIS — I10 Essential (primary) hypertension: Secondary | ICD-10-CM

## 2023-09-14 DIAGNOSIS — D649 Anemia, unspecified: Secondary | ICD-10-CM

## 2023-09-14 DIAGNOSIS — E1165 Type 2 diabetes mellitus with hyperglycemia: Secondary | ICD-10-CM

## 2023-09-14 DIAGNOSIS — I110 Hypertensive heart disease with heart failure: Secondary | ICD-10-CM

## 2023-09-14 DIAGNOSIS — D508 Other iron deficiency anemias: Secondary | ICD-10-CM

## 2023-09-14 DIAGNOSIS — E876 Hypokalemia: Secondary | ICD-10-CM

## 2023-09-14 DIAGNOSIS — R197 Diarrhea, unspecified: Secondary | ICD-10-CM

## 2023-09-14 LAB — POCT GLYCOSYLATED HEMOGLOBIN (HGB A1C): HbA1c, POC (controlled diabetic range): 6 % (ref 0.0–7.0)

## 2023-09-14 MED ORDER — ENTRESTO 97-103 MG PO TABS
1.0000 | ORAL_TABLET | Freq: Two times a day (BID) | ORAL | 1 refills | Status: DC
  Filled 2023-09-14: qty 60, 30d supply, fill #0
  Filled 2023-10-11: qty 60, 30d supply, fill #1
  Filled 2023-11-18 (×2): qty 60, 30d supply, fill #2

## 2023-09-14 MED ORDER — POTASSIUM CHLORIDE CRYS ER 20 MEQ PO TBCR
20.0000 meq | EXTENDED_RELEASE_TABLET | Freq: Two times a day (BID) | ORAL | 1 refills | Status: DC
  Filled 2023-09-14: qty 60, 30d supply, fill #0

## 2023-09-14 MED ORDER — TIRZEPATIDE 7.5 MG/0.5ML ~~LOC~~ SOAJ
7.5000 mg | SUBCUTANEOUS | 1 refills | Status: DC
  Filled 2023-09-14: qty 2, 28d supply, fill #0

## 2023-09-14 MED ORDER — SPIRONOLACTONE 25 MG PO TABS
25.0000 mg | ORAL_TABLET | Freq: Every day | ORAL | 1 refills | Status: DC
  Filled 2023-09-14: qty 90, 90d supply, fill #0
  Filled 2023-09-15 – 2023-10-05 (×3): qty 30, 30d supply, fill #0
  Filled 2023-11-01: qty 30, 30d supply, fill #1
  Filled 2023-11-01: qty 90, 90d supply, fill #1

## 2023-09-14 MED ORDER — CARVEDILOL 6.25 MG PO TABS
6.2500 mg | ORAL_TABLET | Freq: Two times a day (BID) | ORAL | 1 refills | Status: DC
  Filled 2023-09-14: qty 60, 30d supply, fill #0
  Filled 2023-10-11: qty 60, 30d supply, fill #1
  Filled 2023-11-18: qty 60, 30d supply, fill #2

## 2023-09-14 NOTE — Progress Notes (Signed)
 Subjective:  Patient ID: Derek Reed, male    DOB: [birth year]  Age: 38 y.o. MRN: [id number]  CC: Hospitalization Follow-up (Discuss restarting medications)     Discussed the use of AI scribe software for clinical note transcription with the patient, who gave verbal consent to proceed.  History of Present Illness Derek Reed is a 37 year old male with type 2 diabetes, congestive heart failure (EF 55 to 60% from echo 02/2021), pretension who presents for transition of care visit after hospitalization from 09/09/2023 through 09/10/2023 for nausea and vomiting with secondary acute kidney injury.  He experienced nausea, vomiting, and diarrhea after increasing his Mounjaro  dose to 10 mg, leading to hospitalization. Symptoms began on the Tuesday or Wednesday following his Monday injection.   Notes also question the possibility of a viral illness.  He did have transient elevation of his creatinine which improved with IV hydration.  Electrolyte derangements including hypokalemia of 3.1 and anemia with a hemoglobin of 10.9 representing his labs.  His Entresto , spironolactone  and chlorthalidone  were held at discharge. Gastrointestinal symptoms were severe, causing a two-week work absence. He had a one-time episode of blood in stool yesterday, attributed to diarrhea. No recent constipation or abdominal pain.These symptoms have not recurred and he skipped his regular dose which he would have administered yesterday.  He initially started Mounjaro  at the lowest dose, increasing monthly. His A1c improved to 6.0 from 6.6, indicating good diabetes control. He seeks further weight loss, which influenced the dose increase.       Past Medical History:  Diagnosis Date   Acute CHF (congestive heart failure) (HCC) 02/24/2019   Anxiety    CKD (chronic kidney disease) stage 2, GFR 60-89 ml/min    Diabetes mellitus without complication (HCC)    Based on A1C. Pt diet controlled.   Essential hypertension  02/24/2019   GAD (generalized anxiety disorder) 03/26/2014   Hyperlipidemia    Hypertension    Obesity    Peritonsillar abscess     Past Surgical History:  Procedure Laterality Date   CHOLECYSTECTOMY     CHOLECYSTECTOMY N/A 01/15/2023   Procedure: LAPAROSCOPIC CHOLECYSTECTOMY;  Surgeon: Dorena Gander, MD;  Location: [facility] OR;  Service: General;  Laterality: N/A;   INCISION AND DRAINAGE OF PERITONSILLAR ABCESS     RIGHT/LEFT HEART CATH AND CORONARY ANGIOGRAPHY N/A 02/24/2019   Procedure: RIGHT/LEFT HEART CATH AND CORONARY ANGIOGRAPHY;  Surgeon: Darlis Eisenmenger, MD;  Location: [facility] INVASIVE CV LAB;  Service: Cardiovascular;  Laterality: N/A;    Family History  Problem Relation Age of Onset   Hyperlipidemia Mother    Diabetes Mother    Stroke Mother    Hypertension Mother    Hypertension Father     Social History   Socioeconomic History   Marital status: Married    Spouse name: Not on file   Number of children: Not on file   Years of education: Not on file   Highest education level: Not on file  Occupational History   Not on file  Tobacco Use   Smoking status: Former   Smokeless tobacco: Never  Vaping Use   Vaping status: Never Used  Substance and Sexual Activity   Alcohol use: No   Drug use: No   Sexual activity: Not on file  Other Topics Concern   Not on file  Social History Narrative   Not on file   Social Drivers of Health   Financial Resource Strain: Medium Risk (06/17/2023)   Overall Financial Resource Strain (CARDIA)  Difficulty of Paying Living Expenses: Somewhat hard  Food Insecurity: No Food Insecurity (09/09/2023)   Hunger Vital Sign    Worried About Running Out of Food in the Last Year: Never true    Ran Out of Food in the Last Year: Never true  Transportation Needs: No Transportation Needs (09/09/2023)   PRAPARE - Administrator, Civil Service (Medical): No    Lack of Transportation (Non-Medical): No  Physical Activity: Inactive (06/17/2023)    Exercise Vital Sign    Days of Exercise per Week: 0 days    Minutes of Exercise per Session: 0 min  Stress: No Stress Concern Present (06/17/2023)   [employer] of Occupational Health - Occupational Stress Questionnaire    Feeling of Stress : Only a little  Social Connections: Socially Isolated (06/17/2023)   Social Connection and Isolation Panel [NHANES]    Frequency of Communication with Friends and Family: Never    Frequency of Social Gatherings with Friends and Family: Never    Attends Religious Services: Never    Database administrator or Organizations: No    Attends Engineer, structural: Never    Marital Status: Living with partner    Allergies  Allergen Reactions   Egg-Derived Products Nausea And Vomiting    He just vomits eggs if he eats them but he eats many products that contain eggs without any problem 06/17/2023    Outpatient Medications Prior to Visit  Medication Sig Dispense Refill   atorvastatin  (LIPITOR) 10 MG tablet Take 0.5 tablets (5 mg total) by mouth daily. 45 tablet 1   hydrALAZINE  (APRESOLINE ) 50 MG tablet Take 1 tablet (50 mg total) by mouth 3 (three) times daily. (Patient taking differently: Take 50 mg by mouth in the morning and at bedtime.) 90 tablet 0   Iron , Ferrous Sulfate , 325 (65 Fe) MG TABS Take 1 tablet by mouth every other day. 45 tablet 0   loperamide  (IMODIUM  A-D) 2 MG tablet Take 1 tablet (2 mg total) by mouth 4 (four) times daily as needed for diarrhea or loose stools. 15 tablet 0   ondansetron  (ZOFRAN -ODT) 4 MG disintegrating tablet Take 1 tablet (4 mg total) by mouth every 8 (eight) hours as needed for nausea, vomiting or refractory nausea / vomiting. 15 tablet 0   sildenafil  (VIAGRA ) 50 MG tablet Take 1 tablet (50 mg total) by mouth daily as needed for erectile dysfunction. 30 tablet 5   carvedilol  (COREG ) 12.5 MG tablet Take 1 tablet (12.5 mg total) by mouth 2 (two) times daily with a meal. 60 tablet 6   chlorthalidone  (HYGROTON )  25 MG tablet Take 1 tablet (25 mg total) by mouth daily. 30 tablet 6   potassium chloride  SA (KLOR-CON  M) 20 MEQ tablet Take 1 tablet (20 mEq total) by mouth 2 (two) times daily. 60 tablet 3   sacubitril -valsartan  (ENTRESTO ) 97-103 MG Take 1 tablet by mouth 2 (two) times daily. 60 tablet 5   spironolactone  (ALDACTONE ) 25 MG tablet Take 1 tablet (25 mg total) by mouth daily. 30 tablet 0   tirzepatide  (MOUNJARO ) 10 MG/0.5ML Pen Inject 10 mg into the skin once a week. 2 mL 0   No facility-administered medications prior to visit.     ROS Review of Systems  Constitutional:  Negative for activity change and appetite change.  HENT:  Negative for sinus pressure and sore throat.   Respiratory:  Negative for chest tightness, shortness of breath and wheezing.   Cardiovascular:  Negative for chest  pain and palpitations.  Gastrointestinal:  Negative for abdominal distention, abdominal pain and constipation.  Genitourinary: Negative.   Musculoskeletal: Negative.   Psychiatric/Behavioral:  Negative for behavioral problems and dysphoric mood.     Objective:  BP 103/70   Pulse 73   Ht 5\' 6"  (1.676 m)   Wt 242 lb 3.2 oz (109.9 kg)   SpO2 98%   BMI 39.09 kg/m      09/14/2023    9:59 AM 09/10/2023   11:22 AM 09/10/2023    9:39 AM  BP/Weight  Systolic BP 103 123 117  Diastolic BP 70 90 82  Wt. (Lbs) 242.2    BMI 39.09 kg/m2        Physical Exam Constitutional:      Appearance: He is well-developed.  Cardiovascular:     Rate and Rhythm: Normal rate.     Heart sounds: Normal heart sounds. No murmur heard. Pulmonary:     Effort: Pulmonary effort is normal.     Breath sounds: Normal breath sounds. No wheezing or rales.  Chest:     Chest wall: No tenderness.  Abdominal:     General: Bowel sounds are normal. There is no distension.     Palpations: Abdomen is soft. There is no mass.     Tenderness: There is no abdominal tenderness.  Musculoskeletal:        General: Normal range of motion.      Right lower leg: No edema.     Left lower leg: No edema.  Neurological:     Mental Status: He is alert and oriented to person, place, and time.  Psychiatric:        Mood and Affect: Mood normal.        Latest Ref Rng & Units 09/10/2023    4:26 AM 09/09/2023    1:03 AM 09/03/2023   11:37 PM  CMP  Glucose 70 - 99 mg/dL 96  93  130   BUN 6 - 20 mg/dL 17  26  21    Creatinine 0.61 - 1.24 mg/dL 8.65  7.84  6.96   Sodium 135 - 145 mmol/L 137  135  137   Potassium 3.5 - 5.1 mmol/L 3.1  3.2  3.5   Chloride 98 - 111 mmol/L 105  100  100   CO2 22 - 32 mmol/L 22  17  23    Calcium  8.9 - 10.3 mg/dL 8.3  9.4  9.3   Total Protein 6.5 - 8.1 g/dL  7.9  7.4   Total Bilirubin 0.0 - 1.2 mg/dL  0.3  <2.9   Alkaline Phos 38 - 126 U/L  91  78   AST 15 - 41 U/L  16  14   ALT 0 - 44 U/L  12  11     Lipid Panel     Component Value Date/Time   CHOL 93 (L) 03/10/2023 1004   TRIG 118 03/10/2023 1004   HDL 26 (L) 03/10/2023 1004   CHOLHDL 3.6 03/10/2023 1004   CHOLHDL 5.9 02/24/2019 0338   VLDL 13 02/24/2019 0338   LDLCALC 45 03/10/2023 1004    CBC    Component Value Date/Time   WBC 13.9 (H) 09/10/2023 0426   RBC 4.70 09/10/2023 0426   HGB 10.9 (L) 09/10/2023 0426   HGB 11.1 (L) 03/10/2023 1004   HCT 33.2 (L) 09/10/2023 0426   HCT 36.3 (L) 03/10/2023 1004   PLT 271 09/10/2023 0426   PLT 260 03/10/2023 1004   MCV 70.6 (  L) 09/10/2023 0426   MCV 76 (L) 03/10/2023 1004   MCH 23.2 (L) 09/10/2023 0426   MCHC 32.8 09/10/2023 0426   RDW 15.4 09/10/2023 0426   RDW 14.9 03/10/2023 1004   LYMPHSABS 3.8 09/03/2023 2337   LYMPHSABS 2.6 03/10/2023 1004   MONOABS 0.8 09/03/2023 2337   EOSABS 0.6 (H) 09/03/2023 2337   EOSABS 0.2 03/10/2023 1004   BASOSABS 0.0 09/03/2023 2337   BASOSABS 0.0 03/10/2023 1004    Lab Results  Component Value Date   HGBA1C 6.0 09/14/2023       Assessment & Plan Hypertensive heart disease EF of 55 to 60% from echo of 02/2021, euvolemic Management complicated  by recent hypotension and medication adjustments. Current blood pressure is 103/70 mmHg. Need to adjust medications to maintain cardiac function while managing blood pressure. - Decrease carvedilol  dose from 12.5 mg to 6.25 mg. - Restart Entresto  and spironolactone . - Discontinue chlorthalidone . - Continue hydralazine  50 mg three times a day. - Continue potassium supplementation 20 mEq twice a day. - Emphasize medication adherence and blood pressure monitoring. - Keep appointment with cardiology  Type 2 diabetes mellitus Well-controlled with A1c of 6.0. GI Symptoms resolved after discontinuing Mounjaro  10 mg. He desires further weight loss but experienced significant side effects at higher dose. Higher dose unnecessary for diabetes management. -He did not have symptoms with 7.5 mg - Decrease Mounjaro  dose to 7.5 mg. - Administer Mounjaro  today and monitor for symptoms. - Report any adverse symptoms after taking Mounjaro . - Discuss potential side effects of Mounjaro  and importance of monitoring symptoms.  Hypokalemia Noted during recent hospitalization with potassium level at 3.1 mmol/L, likely related to vomiting and diarrhea. Potassium supplementation is ongoing. - Continue potassium supplementation 20 mEq twice a day. - Order blood work to recheck potassium levels.   Diarrhea - Could have been multifactorial secondary to viral illness versus GI symptoms of Mounjaro    Anemia, unspecified Slight anemia noted during recent hospitalization with hemoglobin of 10.9 -Possibly dilutional from IV fluids - Order blood work to recheck anemia status.  Follow-up Monitor response to medication adjustments and lab results. - Schedule follow-up appointment in three months. - Order blood work today to assess current status of anemia, potassium levels, and kidney function. - Follow up with cardiologist.      Meds ordered this encounter  Medications   tirzepatide  (MOUNJARO ) 7.5 MG/0.5ML  Pen    Sig: Inject 7.5 mg into the skin once a week.    Dispense:  6 mL    Refill:  1    Dose decrease   sacubitril -valsartan  (ENTRESTO ) 97-103 MG    Sig: Take 1 tablet by mouth 2 (two) times daily.    Dispense:  180 tablet    Refill:  1    Please keep upcoming appointment for future refills.   spironolactone  (ALDACTONE ) 25 MG tablet    Sig: Take 1 tablet (25 mg total) by mouth daily.    Dispense:  90 tablet    Refill:  1   carvedilol  (COREG ) 6.25 MG tablet    Sig: Take 1 tablet (6.25 mg total) by mouth 2 (two) times daily with a meal.    Dispense:  180 tablet    Refill:  1    Dose decrease   potassium chloride  SA (KLOR-CON  M) 20 MEQ tablet    Sig: Take 1 tablet (20 mEq total) by mouth 2 (two) times daily.    Dispense:  180 tablet    Refill:  1  Dscontinue chlorthalidone     Follow-up: Return in about 3 months (around 12/15/2023) for Chronic medical conditions.       Joaquin Mulberry, MD, FAAFP. [facility] [place], [place] [phone]   09/14/2023, 6:30 PM

## 2023-09-14 NOTE — Patient Instructions (Addendum)
 VISIT SUMMARY:  During your visit, we discussed your recent gastrointestinal symptoms after increasing your Mounjaro  dose, which led to hospitalization. We reviewed your current medications and made adjustments to better manage your heart failure and diabetes. We also addressed your low potassium levels and slight anemia noted during your hospital stay.  YOUR PLAN:  -CONGESTIVE HEART FAILURE: Congestive heart failure means your heart is not pumping blood as well as it should. We have adjusted your medications to help manage your heart function and blood pressure. You should decrease your carvedilol  dose to 6.25 mg, restart Entresto  and spironolactone , discontinue chlorthalidone , and continue taking hydralazine  50 mg three times a day and potassium supplements 20 mEq twice a day. Please monitor your blood pressure regularly and adhere to your medication schedule.  -TYPE 2 DIABETES MELLITUS: Type 2 diabetes is a condition where your body does not use insulin  properly, leading to high blood sugar levels. Your diabetes is well-controlled with an A1c of 6.0. Since you experienced significant side effects at a higher dose of Mounjaro , we have reduced your dose to 7.5 mg. You will administer a Mounjaro  injection today, and it is important to monitor for any adverse symptoms and report them immediately. We discussed the potential side effects of Mounjaro  and the importance of monitoring your symptoms.  -HYPOKALEMIA: Hypokalemia means you have low potassium levels in your blood, which was noted during your recent hospitalization. This was likely due to vomiting and diarrhea. You should continue taking potassium supplements 20 mEq twice a day. We will also order blood work to recheck your potassium levels.  -ANEMIA, UNSPECIFIED: Anemia means you have a lower than normal number of red blood cells. Slight anemia was noted during your recent hospitalization. We will order blood work to recheck your anemia  status.  INSTRUCTIONS:  Please schedule a follow-up appointment in three months. We will also order blood work today to assess your current status of anemia, potassium levels, and kidney function. Additionally, follow up with your cardiologist.

## 2023-09-14 NOTE — Telephone Encounter (Signed)
 Patient has an appointment with Dr Newlin this morning

## 2023-09-15 ENCOUNTER — Encounter: Payer: Self-pay | Admitting: Family Medicine

## 2023-09-15 ENCOUNTER — Other Ambulatory Visit (HOSPITAL_COMMUNITY): Payer: Self-pay

## 2023-09-15 LAB — CMP14+EGFR
ALT: 13 IU/L (ref 0–44)
AST: 17 IU/L (ref 0–40)
Albumin: 4 g/dL — ABNORMAL LOW (ref 4.1–5.1)
Alkaline Phosphatase: 96 IU/L (ref 44–121)
BUN/Creatinine Ratio: 9 (ref 9–20)
BUN: 15 mg/dL (ref 6–24)
Bilirubin Total: <0.2 mg/dL (ref 0.0–1.2)
CO2: 22 mmol/L (ref 20–29)
Calcium: 9.3 mg/dL (ref 8.7–10.2)
Chloride: 103 mmol/L (ref 96–106)
Creatinine, Ser: 1.76 mg/dL — ABNORMAL HIGH (ref 0.76–1.27)
Globulin, Total: 2.9 g/dL (ref 1.5–4.5)
Glucose: 92 mg/dL (ref 70–99)
Potassium: 3.4 mmol/L — ABNORMAL LOW (ref 3.5–5.2)
Sodium: 140 mmol/L (ref 134–144)
Total Protein: 6.9 g/dL (ref 6.0–8.5)
eGFR: 50 mL/min/{1.73_m2} — ABNORMAL LOW (ref >59)

## 2023-09-15 LAB — CBC WITH DIFFERENTIAL/PLATELET
Basophils Absolute: 0.1 10*3/uL (ref 0.0–0.2)
Basos: 1 %
EOS (ABSOLUTE): 1 10*3/uL — ABNORMAL HIGH (ref 0.0–0.4)
Eos: 9 %
Hematocrit: 34.4 % — ABNORMAL LOW (ref 37.5–51.0)
Hemoglobin: 11 g/dL — ABNORMAL LOW (ref 13.0–17.7)
Immature Grans (Abs): 0.2 10*3/uL — ABNORMAL HIGH (ref 0.0–0.1)
Immature Granulocytes: 2 %
Lymphocytes Absolute: 3.3 10*3/uL — ABNORMAL HIGH (ref 0.7–3.1)
Lymphs: 30 %
MCH: 23.4 pg — ABNORMAL LOW (ref 26.6–33.0)
MCHC: 32 g/dL (ref 31.5–35.7)
MCV: 73 fL — ABNORMAL LOW (ref 79–97)
Monocytes Absolute: 0.8 10*3/uL (ref 0.1–0.9)
Monocytes: 8 %
Neutrophils Absolute: 5.7 10*3/uL (ref 1.4–7.0)
Neutrophils: 50 %
Platelets: 246 10*3/uL (ref 150–450)
RBC: 4.7 x10E6/uL (ref 4.14–5.80)
RDW: 15.6 % — ABNORMAL HIGH (ref 11.6–15.4)
WBC: 11 10*3/uL — ABNORMAL HIGH (ref 3.4–10.8)

## 2023-09-16 ENCOUNTER — Other Ambulatory Visit (HOSPITAL_COMMUNITY): Payer: Self-pay

## 2023-09-16 ENCOUNTER — Encounter: Payer: Self-pay | Admitting: Gastroenterology

## 2023-09-23 ENCOUNTER — Ambulatory Visit: Payer: Self-pay | Admitting: Family Medicine

## 2023-09-23 ENCOUNTER — Other Ambulatory Visit (HOSPITAL_COMMUNITY): Payer: Self-pay

## 2023-09-23 DIAGNOSIS — K529 Noninfective gastroenteritis and colitis, unspecified: Secondary | ICD-10-CM

## 2023-09-24 ENCOUNTER — Emergency Department (HOSPITAL_BASED_OUTPATIENT_CLINIC_OR_DEPARTMENT_OTHER)

## 2023-09-24 ENCOUNTER — Emergency Department (HOSPITAL_BASED_OUTPATIENT_CLINIC_OR_DEPARTMENT_OTHER): Admission: EM | Admit: 2023-09-24 | Discharge: 2023-09-24 | Disposition: A | Discharge status: Home / Self Care

## 2023-09-24 ENCOUNTER — Other Ambulatory Visit: Payer: Self-pay

## 2023-09-24 ENCOUNTER — Encounter (HOSPITAL_BASED_OUTPATIENT_CLINIC_OR_DEPARTMENT_OTHER): Payer: Self-pay | Admitting: Emergency Medicine

## 2023-09-24 DIAGNOSIS — I130 Hypertensive heart and chronic kidney disease with heart failure and stage 1 through stage 4 chronic kidney disease, or unspecified chronic kidney disease: Secondary | ICD-10-CM | POA: Insufficient documentation

## 2023-09-24 DIAGNOSIS — I509 Heart failure, unspecified: Secondary | ICD-10-CM | POA: Insufficient documentation

## 2023-09-24 DIAGNOSIS — N189 Chronic kidney disease, unspecified: Secondary | ICD-10-CM | POA: Diagnosis not present

## 2023-09-24 DIAGNOSIS — R0789 Other chest pain: Secondary | ICD-10-CM | POA: Diagnosis present

## 2023-09-24 DIAGNOSIS — Z79899 Other long term (current) drug therapy: Secondary | ICD-10-CM | POA: Diagnosis not present

## 2023-09-24 DIAGNOSIS — E1122 Type 2 diabetes mellitus with diabetic chronic kidney disease: Secondary | ICD-10-CM | POA: Insufficient documentation

## 2023-09-24 DIAGNOSIS — R079 Chest pain, unspecified: Secondary | ICD-10-CM

## 2023-09-24 LAB — BASIC METABOLIC PANEL WITH GFR
Anion gap: 13 (ref 5–15)
BUN: 12 mg/dL (ref 6–20)
CO2: 20 mmol/L — ABNORMAL LOW (ref 22–32)
Calcium: 8.5 mg/dL — ABNORMAL LOW (ref 8.9–10.3)
Chloride: 107 mmol/L (ref 98–111)
Creatinine, Ser: 1.46 mg/dL — ABNORMAL HIGH (ref 0.61–1.24)
GFR, Estimated: >60 mL/min (ref >60)
Glucose, Bld: 144 mg/dL — ABNORMAL HIGH (ref 70–99)
Potassium: 3.7 mmol/L (ref 3.5–5.1)
Sodium: 141 mmol/L (ref 135–145)

## 2023-09-24 LAB — TROPONIN T, HIGH SENSITIVITY
Troponin T High Sensitivity: <15 ng/L (ref <19)
Troponin T High Sensitivity: <15 ng/L (ref <19)

## 2023-09-24 LAB — CBC
HCT: 33.3 % — ABNORMAL LOW (ref 39.0–52.0)
Hemoglobin: 10.7 g/dL — ABNORMAL LOW (ref 13.0–17.0)
MCH: 23.5 pg — ABNORMAL LOW (ref 26.0–34.0)
MCHC: 32.1 g/dL (ref 30.0–36.0)
MCV: 73 fL — ABNORMAL LOW (ref 80.0–100.0)
Platelets: 265 10*3/uL (ref 150–400)
RBC: 4.56 MIL/uL (ref 4.22–5.81)
RDW: 17.6 % — ABNORMAL HIGH (ref 11.5–15.5)
WBC: 6.8 10*3/uL (ref 4.0–10.5)
nRBC: 0 % (ref 0.0–0.2)

## 2023-09-24 LAB — D-DIMER, QUANTITATIVE: D-Dimer, Quant: <0.27 ug{FEU}/mL (ref 0.00–0.50)

## 2023-09-24 MED ORDER — LACTATED RINGERS IV BOLUS
500.0000 mL | Freq: Once | INTRAVENOUS | Status: AC
  Administered 2023-09-24: 500 mL via INTRAVENOUS

## 2023-09-24 NOTE — Discharge Instructions (Signed)
 Your workup today was reassuring.  Your blood test for your heart came back normal.  There were no signs of heart failure on your chest x-ray.  I have placed a referral to cardiology.  You should receive a call in the next few days for reevaluation.  Please follow-up and return to the ER for worsening symptoms.

## 2023-09-24 NOTE — ED Triage Notes (Signed)
 Left sided chest pain, non-radiating since last night.  No sob, no N/V, no diaphoresis.  Pt was at work upon onset, driving a fork lift.  No acute distress noted in triage

## 2023-09-24 NOTE — ED Provider Notes (Signed)
 [place] [facility]ERGENCY DEPARTMENT AT [facility] Provider Note   CSN: [id number] Arrival date & time: 09/24/23  1814     History  Chief Complaint  Patient presents with   Chest Pain    Derek Reed is a 38 y.o. male.  38 year old male with past medical history of diabetes, hypertension, and remote history of CHF with most recent echocardiogram showing a normal ejection fraction presenting to the emergency department today with chest tightness.  The patient states this began yesterday afternoon.  He states that the tightness is mostly in the left side of his chest.  He denies any significant shortness of breath.  Denies any leg pain or swelling.  He states that the pain comes and goes and is not exertional.  He denies a history of DVT or pulmonary embolism, recent surgeries, recent travel.  He came to the emergency department today primarily due to these symptoms.   Chest Pain      Home Medications Prior to Admission medications   Medication Sig Start Date End Date Taking? Authorizing Provider  atorvastatin  (LIPITOR) 10 MG tablet Take 0.5 tablets (5 mg total) by mouth daily. 06/17/23   Hassie Lint, PA-C  carvedilol  (COREG ) 6.25 MG tablet Take 1 tablet (6.25 mg total) by mouth 2 (two) times daily with a meal. 09/14/23   Joaquin Mulberry, MD  hydrALAZINE  (APRESOLINE ) 50 MG tablet Take 1 tablet (50 mg total) by mouth 3 (three) times daily. Patient taking differently: Take 50 mg by mouth in the morning and at bedtime. 08/27/23   Newlin, Enobong, MD  Iron , Ferrous Sulfate , 325 (65 Fe) MG TABS Take 1 tablet by mouth every other day. 07/12/23   Mayers, Cari S, PA-C  loperamide  (IMODIUM  A-D) 2 MG tablet Take 1 tablet (2 mg total) by mouth 4 (four) times daily as needed for diarrhea or loose stools. 09/10/23   Ghimire, Estil Heman, MD  ondansetron  (ZOFRAN -ODT) 4 MG disintegrating tablet Take 1 tablet (4 mg total) by mouth every 8 (eight) hours as needed for nausea, vomiting or refractory  nausea / vomiting. 09/10/23   Ghimire, Estil Heman, MD  potassium chloride  SA (KLOR-CON  M) 20 MEQ tablet Take 1 tablet (20 mEq total) by mouth 2 (two) times daily. 09/14/23   Newlin, Enobong, MD  sacubitril -valsartan  (ENTRESTO ) 97-103 MG Take 1 tablet by mouth 2 (two) times daily. 09/14/23   Newlin, Enobong, MD  sildenafil  (VIAGRA ) 50 MG tablet Take 1 tablet (50 mg total) by mouth daily as needed for erectile dysfunction. 07/07/23   Croitoru, Mihai, MD  spironolactone  (ALDACTONE ) 25 MG tablet Take 1 tablet (25 mg total) by mouth daily. 09/14/23   Newlin, Enobong, MD  tirzepatide  (MOUNJARO ) 7.5 MG/0.5ML Pen Inject 7.5 mg into the skin once a week. 09/14/23   Newlin, Enobong, MD      Allergies    Egg-derived products    Review of Systems   Review of Systems  Cardiovascular:  Positive for chest pain.  All other systems reviewed and are negative.   Physical Exam Updated Vital Signs BP (!) 97/52   Pulse 73   Temp 98.4 F (36.9 C) (Oral)   Resp (!) 21   Ht 5\' 6"  (1.676 m)   Wt 108.9 kg   SpO2 100%   BMI 38.74 kg/m  Physical Exam Vitals and nursing note reviewed.   Gen: NAD Eyes: PERRL, EOMI HEENT: no oropharyngeal swelling Neck: trachea midline Resp: clear to auscultation bilaterally Card: RRR, no murmurs, rubs, or gallops Abd:  nontender, nondistended Extremities: no calf tenderness, no edema Vascular: 2+ radial pulses bilaterally, 2+ DP pulses bilaterally Neuro: No focal deficits Skin: no rashes Psyc: acting appropriately   ED Results / Procedures / Treatments   Labs (all labs ordered are listed, but only abnormal results are displayed) Labs Reviewed  BASIC METABOLIC PANEL WITH GFR - Abnormal; Notable for the following components:      Result Value   CO2 20 (*)    Glucose, Bld 144 (*)    Creatinine, Ser 1.46 (*)    Calcium  8.5 (*)    All other components within normal limits  CBC - Abnormal; Notable for the following components:   Hemoglobin 10.7 (*)    HCT 33.3 (*)    MCV  73.0 (*)    MCH 23.5 (*)    RDW 17.6 (*)    All other components within normal limits  D-DIMER, QUANTITATIVE  TROPONIN T, HIGH SENSITIVITY  TROPONIN T, HIGH SENSITIVITY    EKG EKG Interpretation Date/Time:  Friday Sep 24 2023 18:22:56 EDT Ventricular Rate:  75 PR Interval:  183 QRS Duration:  80 QT Interval:  362 QTC Calculation: 405 R Axis:   -20  Text Interpretation: Sinus rhythm Left ventricular hypertrophy Confirmed by Abner Hoffman [phone]) on 09/24/2023 6:32:50 PM  Radiology DG Chest 2 View Result Date: 09/24/2023 CLINICAL DATA:  Chest pain EXAM: CHEST - 2 VIEW COMPARISON:  11/20/2021 FINDINGS: The heart size and mediastinal contours are within normal limits. Both lungs are clear. The visualized skeletal structures are unremarkable. IMPRESSION: No active cardiopulmonary disease. Electronically Signed   By: Esmeralda Hedge M.D.   On: 09/24/2023 19:06    Procedures Procedures    Medications Ordered in ED Medications  lactated ringers  bolus 500 mL (500 mLs Intravenous New Bag/Given 09/24/23 2103)    ED Course/ Medical Decision Making/ A&P                                 Medical Decision Making 38 year old male with past medical history of chronic kidney disease, hypertension, hyperlipidemia, diabetes, and remote history of CHF now with return to normal ejection fraction presenting to the emergency department today with chest tightness.  I will further evaluate the patient here with basic labs Wels and EKG, chest x-ray, and troponin to evaluate for pulmonary edema, pulmonary infiltrates, or pneumothorax.  Based on description of his symptoms and reassuring vital signs suspicion for pulmonary embolism is low at this time.  He has initial blood pressure was in the high 90s and when I evaluate the patient in the room it is in the low 100s.  I will reevaluate for ultimate disposition.  The patient had 2 troponins here that are negative.  His EKG is reassuring.  He did have a few more  blood pressures in the high 90s so I did add on a D-dimer.  This is negative.  I think the patient is stable for cardiology follow-up.  Given his comorbidities I do think that he should follow-up with cardiology and this was discussed with the patient.  Amount and/or Complexity of Data Reviewed Labs: ordered. Radiology: ordered.           Final Clinical Impression(s) / ED Diagnoses Final diagnoses:  Chest pain, unspecified type    Rx / DC Orders ED Discharge Orders          Ordered    Ambulatory referral to Cardiology  Comments: If you have not heard from the Cardiology office within the next 72 hours please call [phone].   09/24/23 2105              Carin Charleston, MD 09/24/23 2107

## 2023-09-30 ENCOUNTER — Other Ambulatory Visit (HOSPITAL_COMMUNITY): Payer: Self-pay

## 2023-09-30 ENCOUNTER — Other Ambulatory Visit: Payer: Self-pay

## 2023-10-03 ENCOUNTER — Other Ambulatory Visit: Payer: Self-pay

## 2023-10-04 NOTE — Progress Notes (Unsigned)
 This encounter was created in error - please disregard.

## 2023-10-05 ENCOUNTER — Ambulatory Visit: Admitting: Pharmacist Clinician (PhC)/ Clinical Pharmacy Specialist

## 2023-10-05 ENCOUNTER — Other Ambulatory Visit (HOSPITAL_COMMUNITY): Payer: Self-pay

## 2023-10-05 ENCOUNTER — Telehealth: Payer: Self-pay | Admitting: Pharmacist Clinician (PhC)/ Clinical Pharmacy Specialist

## 2023-10-05 NOTE — Telephone Encounter (Signed)
 Patient came in for GLP follow up visit.  Had stopped medication almost a month ago after 3 trips to ED for diarrhea, nausea, vomiting.  Third trip was kept for observation.    He noted that he did lose some weight and was noticing change in his eating habits, but unfortunately the side effects were more than he could deal with.  Missed multiple days of work.   Cancelled appointment for today.

## 2023-10-06 ENCOUNTER — Other Ambulatory Visit (HOSPITAL_COMMUNITY): Payer: Self-pay

## 2023-10-06 ENCOUNTER — Encounter (HOSPITAL_BASED_OUTPATIENT_CLINIC_OR_DEPARTMENT_OTHER): Payer: Self-pay | Admitting: Radiology

## 2023-10-06 ENCOUNTER — Emergency Department (HOSPITAL_BASED_OUTPATIENT_CLINIC_OR_DEPARTMENT_OTHER)
Admission: EM | Admit: 2023-10-06 | Discharge: 2023-10-06 | Disposition: A | Discharge status: Home / Self Care | Attending: Emergency Medicine | Admitting: Emergency Medicine

## 2023-10-06 ENCOUNTER — Other Ambulatory Visit: Payer: Self-pay

## 2023-10-06 DIAGNOSIS — I509 Heart failure, unspecified: Secondary | ICD-10-CM | POA: Insufficient documentation

## 2023-10-06 DIAGNOSIS — Z79899 Other long term (current) drug therapy: Secondary | ICD-10-CM | POA: Insufficient documentation

## 2023-10-06 DIAGNOSIS — K029 Dental caries, unspecified: Secondary | ICD-10-CM | POA: Insufficient documentation

## 2023-10-06 DIAGNOSIS — I110 Hypertensive heart disease with heart failure: Secondary | ICD-10-CM | POA: Insufficient documentation

## 2023-10-06 DIAGNOSIS — F419 Anxiety disorder, unspecified: Secondary | ICD-10-CM | POA: Insufficient documentation

## 2023-10-06 DIAGNOSIS — E119 Type 2 diabetes mellitus without complications: Secondary | ICD-10-CM | POA: Insufficient documentation

## 2023-10-06 DIAGNOSIS — K0889 Other specified disorders of teeth and supporting structures: Secondary | ICD-10-CM | POA: Diagnosis present

## 2023-10-06 MED ORDER — AMOXICILLIN 500 MG PO CAPS
1000.0000 mg | ORAL_CAPSULE | Freq: Two times a day (BID) | ORAL | 0 refills | Status: AC
  Filled 2023-10-06: qty 40, 10d supply, fill #0

## 2023-10-06 MED ORDER — HYDROXYZINE HCL 25 MG PO TABS
25.0000 mg | ORAL_TABLET | Freq: Four times a day (QID) | ORAL | 0 refills | Status: DC
  Filled 2023-10-06: qty 12, 3d supply, fill #0

## 2023-10-06 MED ORDER — ACETAMINOPHEN 325 MG PO TABS
650.0000 mg | ORAL_TABLET | Freq: Once | ORAL | Status: AC
  Administered 2023-10-06: 650 mg via ORAL
  Filled 2023-10-06: qty 2

## 2023-10-06 MED ORDER — HYDROXYZINE HCL 25 MG PO TABS
25.0000 mg | ORAL_TABLET | Freq: Once | ORAL | Status: AC
  Administered 2023-10-06: 25 mg via ORAL
  Filled 2023-10-06: qty 1

## 2023-10-06 MED ORDER — AMOXICILLIN 500 MG PO CAPS
1000.0000 mg | ORAL_CAPSULE | Freq: Once | ORAL | Status: AC
  Administered 2023-10-06: 1000 mg via ORAL
  Filled 2023-10-06: qty 2

## 2023-10-06 MED ORDER — IBUPROFEN 400 MG PO TABS
400.0000 mg | ORAL_TABLET | Freq: Once | ORAL | Status: AC
  Administered 2023-10-06: 400 mg via ORAL
  Filled 2023-10-06: qty 1

## 2023-10-06 NOTE — ED Triage Notes (Signed)
 States he has dental pain that started a couple of days ago. He also has complaints of feeling anxious. He endorses trying to lay down and go to sleep but he feels jittery.

## 2023-10-06 NOTE — ED Provider Notes (Signed)
 [facility] Provider Note   CSN: [id number] Arrival date & time: 10/06/23  0127     History  Chief Complaint  Patient presents with   Dental Pain   Anxiety    Derek Reed is a 38 y.o. male.  The history is provided by the patient.  Dental Pain Anxiety  He has history of hypertension, diabetes, heart failure, generalized anxiety disorder and comes in complaining of a toothache and a right lower tooth which started 2 days ago.  He does have an appointment with a dentist in 2 days.  He has not taken anything for pain.  He also states that he is feeling anxious tonight.  He denies any precipitating event to trigger his anxiety.   Home Medications Prior to Admission medications   Medication Sig Start Date End Date Taking? Authorizing Provider  amoxicillin  (AMOXIL ) 500 MG capsule Take 2 capsules (1,000 mg total) by mouth 2 (two) times daily. 10/06/23  Yes Alissa April, MD  hydrOXYzine  (ATARAX ) 25 MG tablet Take 1 tablet (25 mg total) by mouth every 6 (six) hours. 10/06/23  Yes Alissa April, MD  atorvastatin  (LIPITOR) 10 MG tablet Take 0.5 tablets (5 mg total) by mouth daily. 06/17/23   Hassie Lint, PA-C  carvedilol  (COREG ) 6.25 MG tablet Take 1 tablet (6.25 mg total) by mouth 2 (two) times daily with a meal. 09/14/23   Joaquin Mulberry, MD  hydrALAZINE  (APRESOLINE ) 50 MG tablet Take 1 tablet (50 mg total) by mouth 3 (three) times daily. Patient taking differently: Take 50 mg by mouth in the morning and at bedtime. 08/27/23   Newlin, Enobong, MD  Iron , Ferrous Sulfate , 325 (65 Fe) MG TABS Take 1 tablet by mouth every other day. 07/12/23   Mayers, Cari S, PA-C  loperamide  (IMODIUM  A-D) 2 MG tablet Take 1 tablet (2 mg total) by mouth 4 (four) times daily as needed for diarrhea or loose stools. 09/10/23   Ghimire, Estil Heman, MD  ondansetron  (ZOFRAN -ODT) 4 MG disintegrating tablet Take 1 tablet (4 mg total) by mouth every 8 (eight) hours as needed for  nausea, vomiting or refractory nausea / vomiting. 09/10/23   Ghimire, Estil Heman, MD  potassium chloride  SA (KLOR-CON  M) 20 MEQ tablet Take 1 tablet (20 mEq total) by mouth 2 (two) times daily. 09/14/23   Newlin, Enobong, MD  sacubitril -valsartan  (ENTRESTO ) 97-103 MG Take 1 tablet by mouth 2 (two) times daily. 09/14/23   Newlin, Enobong, MD  sildenafil  (VIAGRA ) 50 MG tablet Take 1 tablet (50 mg total) by mouth daily as needed for erectile dysfunction. 07/07/23   Croitoru, Mihai, MD  spironolactone  (ALDACTONE ) 25 MG tablet Take 1 tablet (25 mg total) by mouth daily. 09/14/23   Newlin, Enobong, MD  tirzepatide  (MOUNJARO ) 7.5 MG/0.5ML Pen Inject 7.5 mg into the skin once a week. 09/14/23   Newlin, Enobong, MD      Allergies    Egg-derived products    Review of Systems   Review of Systems  All other systems reviewed and are negative.   Physical Exam Updated Vital Signs BP 111/78 (BP Location: Right Arm)   Pulse 67   Temp 98.6 F (37 C) (Oral)   Resp 18   Ht 5\' 6"  (1.676 m)   Wt 108 kg   SpO2 98%   BMI 38.41 kg/m  Physical Exam Vitals and nursing note reviewed.   38 year old male, resting comfortably and in no acute distress. Vital signs are normal. Oxygen  saturation is 98%, which is normal. Head is normocephalic and atraumatic. PERRLA, EOMI.  Tooth #29 has severe dental caries and is tender to percussion.  There is no swelling of the underlying gingiva. Neck is nontender and supple without adenopathy Lungs are clear without rales, wheezes, or rhonchi. Heart has regular rate and rhythm without murmur. Neurologic: Mental status is normal, moves all extremities equally. Psychiatric, mental status is normal, does not appear to be responding to internal stimuli.  ED Results / Procedures / Treatments    Procedures Procedures    Medications Ordered in ED Medications  ibuprofen  (ADVIL ) tablet 400 mg (has no administration in time range)  acetaminophen  (TYLENOL ) tablet 650 mg (has no  administration in time range)  hydrOXYzine  (ATARAX ) tablet 25 mg (has no administration in time range)  amoxicillin  (AMOXIL ) capsule 1,000 mg (has no administration in time range)    ED Course/ Medical Decision Making/ A&P                                 Medical Decision Making  Dental caries with pain.  I have ordered a prescription for amoxicillin  and I have given initial dose in the ED.  I have also advised him to use over-the-counter NSAIDs and acetaminophen .  I have ordered a dose of acetaminophen  and ibuprofen .  I have ordered a dose of hydroxyzine  for anxiety and I am giving him a prescription for a small number of hydroxyzine  tablets.  I am recommending that he follow-up with his mental health professional.  Final Clinical Impression(s) / ED Diagnoses Final diagnoses:  Pain due to dental caries  Anxiety    Rx / DC Orders ED Discharge Orders          Ordered    amoxicillin  (AMOXIL ) 500 MG capsule  2 times daily        10/06/23 0220    hydrOXYzine  (ATARAX ) 25 MG tablet  Every 6 hours        10/06/23 0220              Alissa April, MD 10/06/23 [phone]

## 2023-10-06 NOTE — ED Notes (Signed)
 Dental pain started 3 days ago.  Pt states he can not sleep and is anxious

## 2023-10-06 NOTE — Discharge Instructions (Addendum)
 See your dentist on Thursday, May 29, as scheduled.  You may take acetaminophen  and/or ibuprofen  as needed for pain.  Please be aware that if you combine ibuprofen  and acetaminophen , you will get better pain relief than you get from taking either medication by itself.  If you do not have a mental health provider, I am giving you a resource guide for mental health providers in the area.

## 2023-10-11 ENCOUNTER — Other Ambulatory Visit: Payer: Self-pay | Admitting: Family Medicine

## 2023-10-12 ENCOUNTER — Other Ambulatory Visit (HOSPITAL_COMMUNITY): Payer: Self-pay

## 2023-10-12 ENCOUNTER — Encounter: Payer: Self-pay | Admitting: Gastroenterology

## 2023-10-12 ENCOUNTER — Other Ambulatory Visit (INDEPENDENT_AMBULATORY_CARE_PROVIDER_SITE_OTHER)

## 2023-10-12 ENCOUNTER — Other Ambulatory Visit (HOSPITAL_BASED_OUTPATIENT_CLINIC_OR_DEPARTMENT_OTHER): Payer: Self-pay

## 2023-10-12 ENCOUNTER — Ambulatory Visit (INDEPENDENT_AMBULATORY_CARE_PROVIDER_SITE_OTHER): Admitting: Gastroenterology

## 2023-10-12 ENCOUNTER — Other Ambulatory Visit: Payer: Self-pay

## 2023-10-12 ENCOUNTER — Telehealth: Payer: Self-pay

## 2023-10-12 VITALS — BP 98/60 | HR 62 | Ht 66.0 in | Wt 248.0 lb

## 2023-10-12 DIAGNOSIS — D509 Iron deficiency anemia, unspecified: Secondary | ICD-10-CM

## 2023-10-12 DIAGNOSIS — R195 Other fecal abnormalities: Secondary | ICD-10-CM | POA: Diagnosis not present

## 2023-10-12 DIAGNOSIS — R112 Nausea with vomiting, unspecified: Secondary | ICD-10-CM | POA: Diagnosis not present

## 2023-10-12 DIAGNOSIS — R197 Diarrhea, unspecified: Secondary | ICD-10-CM

## 2023-10-12 DIAGNOSIS — R079 Chest pain, unspecified: Secondary | ICD-10-CM

## 2023-10-12 DIAGNOSIS — K625 Hemorrhage of anus and rectum: Secondary | ICD-10-CM

## 2023-10-12 LAB — COMPREHENSIVE METABOLIC PANEL WITH GFR
ALT: 11 U/L (ref 0–53)
AST: 12 U/L (ref 0–37)
Albumin: 3.7 g/dL (ref 3.5–5.2)
Alkaline Phosphatase: 65 U/L (ref 39–117)
BUN: 15 mg/dL (ref 6–23)
CO2: 27 meq/L (ref 19–32)
Calcium: 8.8 mg/dL (ref 8.4–10.5)
Chloride: 107 meq/L (ref 96–112)
Creatinine, Ser: 1.77 mg/dL — ABNORMAL HIGH (ref 0.40–1.50)
GFR: 47.55 mL/min — ABNORMAL LOW (ref >60.00)
Glucose, Bld: 103 mg/dL — ABNORMAL HIGH (ref 70–99)
Potassium: 3.9 meq/L (ref 3.5–5.1)
Sodium: 140 meq/L (ref 135–145)
Total Bilirubin: 0.3 mg/dL (ref 0.2–1.2)
Total Protein: 7 g/dL (ref 6.0–8.3)

## 2023-10-12 LAB — CBC WITH DIFFERENTIAL/PLATELET
Basophils Absolute: 0.1 10*3/uL (ref 0.0–0.1)
Basophils Relative: 0.9 % (ref 0.0–3.0)
Eosinophils Absolute: 0.3 10*3/uL (ref 0.0–0.7)
Eosinophils Relative: 4 % (ref 0.0–5.0)
HCT: 35.3 % — ABNORMAL LOW (ref 39.0–52.0)
Hemoglobin: 11.3 g/dL — ABNORMAL LOW (ref 13.0–17.0)
Lymphocytes Relative: 39 % (ref 12.0–46.0)
Lymphs Abs: 3.4 10*3/uL (ref 0.7–4.0)
MCHC: 31.9 g/dL (ref 30.0–36.0)
MCV: 72.7 fl — ABNORMAL LOW (ref 78.0–100.0)
Monocytes Absolute: 0.9 10*3/uL (ref 0.1–1.0)
Monocytes Relative: 9.8 % (ref 3.0–12.0)
Neutro Abs: 4 10*3/uL (ref 1.4–7.7)
Neutrophils Relative %: 46.3 % (ref 43.0–77.0)
Platelets: 317 10*3/uL (ref 150.0–400.0)
RBC: 4.86 Mil/uL (ref 4.22–5.81)
RDW: 17.4 % — ABNORMAL HIGH (ref 11.5–15.5)
WBC: 8.7 10*3/uL (ref 4.0–10.5)

## 2023-10-12 LAB — TSH: TSH: 1.6 u[IU]/mL (ref 0.35–5.50)

## 2023-10-12 LAB — HIGH SENSITIVITY CRP: CRP, High Sensitivity: 2.02 mg/L (ref 0.000–5.000)

## 2023-10-12 LAB — IBC + FERRITIN
Ferritin: 78.2 ng/mL (ref 22.0–322.0)
Iron: 54 ug/dL (ref 42–165)
Saturation Ratios: 21.3 % (ref 20.0–50.0)
TIBC: 253.4 ug/dL (ref 250.0–450.0)
Transferrin: 181 mg/dL — ABNORMAL LOW (ref 212.0–360.0)

## 2023-10-12 LAB — FOLATE: Folate: 8.2 ng/mL (ref >5.9)

## 2023-10-12 LAB — SEDIMENTATION RATE: Sed Rate: 17 mm/h — ABNORMAL HIGH (ref 0–15)

## 2023-10-12 LAB — VITAMIN B12: Vitamin B-12: 346 pg/mL (ref 211–911)

## 2023-10-12 MED ORDER — HYDRALAZINE HCL 50 MG PO TABS
50.0000 mg | ORAL_TABLET | Freq: Three times a day (TID) | ORAL | 0 refills | Status: DC
  Filled 2023-10-12: qty 90, 30d supply, fill #0

## 2023-10-12 MED ORDER — NA SULFATE-K SULFATE-MG SULF 17.5-3.13-1.6 GM/177ML PO SOLN
1.0000 | Freq: Once | ORAL | 0 refills | Status: AC
Start: 2023-10-12 — End: 2023-10-13
  Filled 2023-10-12: qty 354, 1d supply, fill #0

## 2023-10-12 NOTE — Progress Notes (Signed)
 Derek Reed [id number] 1986/03/24   Chief Complaint: Diarrhea  Referring Provider: Joaquin Mulberry, MD Primary GI MD: Para Bold  HPI: Derek Reed is a 38 y.o. male with past medical history of acute CHF 2020, CKD stage II, diet-controlled T2DM, HTN, HLD, obesity, prior cholecystectomy who presents today for a complaint of diarrhea.    Patient with multiple visits to the ED this year:   - 07/08/2023 for cellulitis of left buttock, discharged with Rx for doxycycline .  - 07/10/2023 for near syncopal episode, had been feeling numb and lightheaded.  EKG without acute ischemic change, low suspicion for ACS.  CBC without leukocytosis and anemia at baseline.  Discharged in stable condition after IV fluids with recommendation for cardiology follow-up.  - 07/12/2023 for pilonidal abscess, told ED provider he had never filled doxycycline  after visit in February.  He was treated with I&D and doxycycline .  - 09/01/2023 for diarrhea and abdominal pain with associated nausea and decreased appetite.  He was discharged in stable condition with oral fluid hydration and other home care for diarrhea recommended.  Prescribed Zofran  for nausea.  - 09/03/2023 returned to the ED for persistent diarrhea and ongoing upper abdominal pain.  He had not picked up Zofran  for nausea.  Endorsed that about a week prior to onset of symptoms he had increased his dose of Mounjaro .  Due to recent antibiotic use, it was discussed that patient could be tested for C. difficile colitis if he could provide a stool sample.  He was unable to produce a bowel movement while in the ED, was prescribed loperamide  as needed for diarrhea.  - 09/09/2023 to 09/10/2023 patient presented to the ED with nausea, vomiting, diarrhea, and was found to have AKI and subsequently admitted.  No acute findings on CT abdomen/pelvis.  Stool GI pathogen panel and C. difficile negative.  He was noted to have an elevated fecal calprotectin of 608.  He was managed  with supportive care.  Advised to follow-up with PCP regarding whether or not to continue Mounjaro  with symptoms possibly a side effect of medication.    - 09/24/2023 for left-sided chest tightness which was intermittent and nonexertional.  Had 2 negative troponins, reassuring EKG, negative D-dimer, discharged in stable condition with recommendation for cardiology follow-up.  - 10/06/2023 for toothache.  Found to have dental caries and was prescribed amoxicillin  and advised to use OTC NSAIDs and acetaminophen .  Patient had follow-up with PCP on 09/14/2023.  Per note, patient's GI symptoms at time of hospitalization were severe, causing a 2-week work absence.  He reported a one-time episode of blood in stool on 09/13/2023, attributed to diarrhea.  Denied constipation or abdominal pain.  His PCP decreased his dose of Mounjaro  to 7.5 mg.  He has been referred to us  for further evaluation of his diarrhea and elevated fecal calprotectin which could possibly point to inflammatory bowel disease.   Today patient states he completely stopped his Mounjaro  and is now doing much better.  Reports he had 2 episodes of vomiting when he was very sick, now no longer having any nausea or vomiting.  His main concern at the time was diarrhea which was frequent and watery, with an average of 4 bowel movements daily.  He had one episode of rectal bleeding where he noticed blood on the toilet paper on 09/13/2023.  He attributes this to frequent diarrhea and frequent wiping.  He has had no further episodes of bleeding.  He had associated centralized abdominal cramping and pain with diarrhea which  lasted a couple weeks.  Denies melena.  Bowel movements have improved, now having less frequent bowel movements which are more formed, though he did have diarrhea again this morning.  Prior to onset of diarrhea, patient would have 1-2 normal bowel movements daily.   Denies acid reflux, heartburn, dysphagia.  Denies prior endoscopic  procedures.  Per chart review, patient has had anemia going back to 2022.  He does have chronic kidney disease.  States he has not had workup for this with hematology, but he is on an oral iron  supplement which he takes every other day.  Continues to have intermittent chest pain.  Has not followed up with cardiology yet.  Denies family history of colon or stomach cancer; however, he does have an aunt who recently had stomach surgery though he is unsure why.   Previous GI Procedures/Imaging   CT renal stone study 09/09/2023 1. No acute abdominopelvic findings.   RUQ US  11/03/2022 1. Cholelithiasis.  No sonographic evidence of acute cholecystitis. 2. No biliary ductal dilatation.  CT A/P 11/03/2022 1. Cholelithiasis. 2. No acute findings.  Past Medical History:  Diagnosis Date   Acute CHF (congestive heart failure) (HCC) 02/24/2019   Anxiety    CKD (chronic kidney disease) stage 2, GFR 60-89 ml/min    Diabetes mellitus without complication (HCC)    Based on A1C. Pt diet controlled.   Essential hypertension 02/24/2019   GAD (generalized anxiety disorder) 03/26/2014   Hyperlipidemia    Hypertension    Obesity    Peritonsillar abscess     Past Surgical History:  Procedure Laterality Date   CHOLECYSTECTOMY     CHOLECYSTECTOMY N/A 01/15/2023   Procedure: LAPAROSCOPIC CHOLECYSTECTOMY;  Surgeon: Dorena Gander, MD;  Location: [facility] OR;  Service: General;  Laterality: N/A;   INCISION AND DRAINAGE OF PERITONSILLAR ABCESS     RIGHT/LEFT HEART CATH AND CORONARY ANGIOGRAPHY N/A 02/24/2019   Procedure: RIGHT/LEFT HEART CATH AND CORONARY ANGIOGRAPHY;  Surgeon: Darlis Eisenmenger, MD;  Location: [facility] INVASIVE CV LAB;  Service: Cardiovascular;  Laterality: N/A;    Current Outpatient Medications  Medication Sig Dispense Refill   amoxicillin  (AMOXIL ) 500 MG capsule Take 2 capsules (1,000 mg total) by mouth 2 (two) times daily. 40 capsule 0   atorvastatin  (LIPITOR) 10 MG tablet Take 0.5 tablets (5  mg total) by mouth daily. 45 tablet 1   carvedilol  (COREG ) 6.25 MG tablet Take 1 tablet (6.25 mg total) by mouth 2 (two) times daily with a meal. 180 tablet 1   hydrALAZINE  (APRESOLINE ) 50 MG tablet Take 1 tablet (50 mg total) by mouth 3 (three) times daily. 90 tablet 0   hydrOXYzine  (ATARAX ) 25 MG tablet Take 1 tablet (25 mg total) by mouth every 6 (six) hours. 12 tablet 0   Iron , Ferrous Sulfate , 325 (65 Fe) MG TABS Take 1 tablet by mouth every other day. 45 tablet 0   loperamide  (IMODIUM  A-D) 2 MG tablet Take 1 tablet (2 mg total) by mouth 4 (four) times daily as needed for diarrhea or loose stools. 15 tablet 0   ondansetron  (ZOFRAN -ODT) 4 MG disintegrating tablet Take 1 tablet (4 mg total) by mouth every 8 (eight) hours as needed for nausea, vomiting or refractory nausea / vomiting. 15 tablet 0   potassium chloride  SA (KLOR-CON  M) 20 MEQ tablet Take 1 tablet (20 mEq total) by mouth 2 (two) times daily. 180 tablet 1   sacubitril -valsartan  (ENTRESTO ) 97-103 MG Take 1 tablet by mouth 2 (two) times daily. 180 tablet 1  sildenafil  (VIAGRA ) 50 MG tablet Take 1 tablet (50 mg total) by mouth daily as needed for erectile dysfunction. 30 tablet 5   spironolactone  (ALDACTONE ) 25 MG tablet Take 1 tablet (25 mg total) by mouth daily. 90 tablet 1   tirzepatide  (MOUNJARO ) 7.5 MG/0.5ML Pen Inject 7.5 mg into the skin once a week. 6 mL 1   No current facility-administered medications for this visit.    Allergies as of 10/12/2023 - Review Complete 10/06/2023  Allergen Reaction Noted   Egg-derived products Nausea And Vomiting 12/05/2022    Family History  Problem Relation Age of Onset   Hyperlipidemia Mother    Diabetes Mother    Stroke Mother    Hypertension Mother    Hypertension Father     Social History   Tobacco Use   Smoking status: Former   Smokeless tobacco: Never  Advertising account planner   Vaping status: Never Used  Substance Use Topics   Alcohol use: No   Drug use: No     Review of Systems:     Constitutional: No weight loss, fever, chills, weakness or fatigue Skin: No rash or itching Cardiovascular: Positive for chest pain Respiratory: No SOB or cough Gastrointestinal: See HPI and otherwise negative Genitourinary: No dysuria or change in urinary frequency Neurological: No headache, dizziness or syncope Musculoskeletal: No new muscle or joint pain Hematologic: No bruising    Physical Exam:  Vital signs: BP 98/60   Pulse 62   Ht 5\' 6"  (1.676 m)   Wt 248 lb (112.5 kg)   BMI 40.03 kg/m    Constitutional: NAD, Well developed, Well nourished, alert and cooperative Head:  Normocephalic and atraumatic.  Eyes: No scleral icterus. Conjunctiva pink. Respiratory: Respirations even and unlabored. Lungs clear to auscultation bilaterally.  No wheezes, crackles, or rhonchi.  Cardiovascular:  Regular rate and rhythm. No murmurs. No peripheral edema. Gastrointestinal:  Soft, nondistended, mild tenderness to palpation of central abdomen. No rebound or guarding. Normal bowel sounds. No appreciable masses or hepatomegaly. Rectal:  Deferred to colonoscopy. Neurologic:  Alert and oriented x4;  grossly normal neurologically.  Skin:   Dry and intact without significant lesions or rashes. Psychiatric: Oriented to person, place and time. Demonstrates good judgement and reason without abnormal affect or behaviors.   RELEVANT LABS AND IMAGING: CBC    Component Value Date/Time   WBC 6.8 09/24/2023 1829   RBC 4.56 09/24/2023 1829   HGB 10.7 (L) 09/24/2023 1829   HGB 11.0 (L) 09/14/2023 1054   HCT 33.3 (L) 09/24/2023 1829   HCT 34.4 (L) 09/14/2023 1054   PLT 265 09/24/2023 1829   PLT 246 09/14/2023 1054   MCV 73.0 (L) 09/24/2023 1829   MCV 73 (L) 09/14/2023 1054   MCH 23.5 (L) 09/24/2023 1829   MCHC 32.1 09/24/2023 1829   RDW 17.6 (H) 09/24/2023 1829   RDW 15.6 (H) 09/14/2023 1054   LYMPHSABS 3.3 (H) 09/14/2023 1054   MONOABS 0.8 09/03/2023 2337   EOSABS 1.0 (H) 09/14/2023 1054    BASOSABS 0.1 09/14/2023 1054    CMP     Component Value Date/Time   NA 141 09/24/2023 1829   NA 140 09/14/2023 1054   K 3.7 09/24/2023 1829   CL 107 09/24/2023 1829   CO2 20 (L) 09/24/2023 1829   GLUCOSE 144 (H) 09/24/2023 1829   BUN 12 09/24/2023 1829   BUN 15 09/14/2023 1054   CREATININE 1.46 (H) 09/24/2023 1829   CREATININE 1.21 03/26/2014 1656   CALCIUM  8.5 (L) 09/24/2023  1829   PROT 6.9 09/14/2023 1054   ALBUMIN 4.0 (L) 09/14/2023 1054   AST 17 09/14/2023 1054   ALT 13 09/14/2023 1054   ALKPHOS 96 09/14/2023 1054   BILITOT <0.2 09/14/2023 1054   GFRNONAA >60 09/24/2023 1829   GFRAA >60 10/30/2019 1356   Echocardiogram 03/06/2021 1. Left ventricular ejection fraction, by estimation, is 55 to 60% . The left ventricle has normal function. The left ventricle has no regional wall motion abnormalities. Left ventricular diastolic parameters were normal.  2. Right ventricular systolic function is normal. The right ventricular size is normal. Tricuspid regurgitation signal is inadequate for assessing PA pressure.  3. The mitral valve is normal in structure. No evidence of mitral valve regurgitation. No evidence of mitral stenosis.  4. The aortic valve is tricuspid. Aortic valve regurgitation is not visualized. No aortic stenosis is present.  5. The inferior vena cava is normal in size with greater than 50% respiratory variability, suggesting right atrial pressure of 3 mmHg.  Assessment/Plan:   Elevated fecal calprotectin Diarrhea Iron  deficiency anemia (dating back to 2022)  Patient with recent hospitalization for nausea, vomiting, diarrhea with AKI.  No acute findings on CT, stool studies negative for infection, however he did have an elevated fecal calprotectin.  Had about 2 weeks of severe diarrhea with abdominal cramping and 1 episode of rectal bleeding.  He has history of iron  deficiency anemia dating back to at least 2021, no prior endoscopic procedures, takes oral iron   supplement every other day. He has stopped taking Mounjaro  due to concern that his symptoms were a result of medication side effect.  Discussed further workup to include repeat labs and stool studies, as well as endoscopies for further evaluation of iron  deficiency anemia, diarrhea, abdominal pain, and elevated fecal calprotectin.  Patient wants to proceed with colonoscopy but would like to hold off on EGD at this time.  - Check labs today: CBC, CMP, ESR, CRP, TSH,  iron /ferritin, B12, folate, TTG, IgA, fecal calprotectin - Schedule colonoscopy. I thoroughly discussed the procedure with the patient to include nature of the procedure, alternatives, benefits, and risks (including but not limited to bleeding, infection, perforation, anesthesia/cardiac/pulmonary complications). Patient verbalized understanding and gave verbal consent to proceed with procedure.  - Will need cardiac clearance for procedure   Chest pain Patient has been recently having intermittent chest pain.  Has not followed up yet with cardiology.  - Will request cardiac clearance for procedure   Valiant Gaul, PA-C [facility] 10/12/2023, 12:32 PM  Patient Care Team: Newlin, Enobong, MD as PCP - General (Family Medicine) Luana Rumple, MD as PCP - Cardiology (Cardiology)

## 2023-10-12 NOTE — Telephone Encounter (Signed)
   Name: Derek Reed  DOB: 18-Jun-1985  MRN: [id number]  Primary Cardiologist: Luana Rumple, MD  Chart reviewed as part of pre-operative protocol coverage. Because of Derek Reed's past medical history and time since last visit, he will require a follow-up in-office visit in order to better assess preoperative cardiovascular risk. Patient presented to the ED last month with chest pain, cardiology follow up was recommended at that time.   Pre-op covering staff: - Please schedule appointment and call patient to inform them. If patient already had an upcoming appointment within acceptable timeframe, please add "pre-op clearance" to the appointment notes so provider is aware. - Please contact requesting surgeon's office via preferred method (i.e, phone, fax) to inform them of need for appointment prior to surgery.   [name], NP  10/12/2023, 3:29 PM

## 2023-10-12 NOTE — Patient Instructions (Signed)
 Your provider has requested that you go to the basement level for lab work before leaving today. Press "B" on the elevator. The lab is located at the first door on the left as you exit the elevator.  You have been scheduled for a colonoscopy. Please follow written instructions given to you at your visit today.   If you use inhalers (even only as needed), please bring them with you on the day of your procedure.  DO NOT TAKE 7 DAYS PRIOR TO TEST- Trulicity (dulaglutide) Ozempic, Wegovy (semaglutide) Mounjaro  (tirzepatide ) Bydureon Bcise (exanatide extended release)  DO NOT TAKE 1 DAY PRIOR TO YOUR TEST Rybelsus (semaglutide) Adlyxin (lixisenatide) Victoza (liraglutide) Byetta (exanatide) ___________________________________________________________________________

## 2023-10-12 NOTE — Telephone Encounter (Signed)
 Patient has been scheduled for pre op clearance.

## 2023-10-12 NOTE — Telephone Encounter (Signed)
 [facility] Pre-operative Risk Assessment     Request for surgical clearance:     Endoscopy Procedure  What type of surgery is being performed?     Colonoscopy  When is this surgery scheduled?     10/25/2023  What type of clearance is required ?   Pharmacy  Are there any medications that need to be held prior to surgery and how long? Not on a blood thinner - just needs general cardiac clearance  Practice name and name of physician performing surgery?      [facility]  What is your office phone and fax number?      Phone- [phone]  Fax- [phone]  Anesthesia type (None, local, MAC, general) ?       MAC   Please route your response to [facility]

## 2023-10-13 ENCOUNTER — Other Ambulatory Visit

## 2023-10-13 DIAGNOSIS — R112 Nausea with vomiting, unspecified: Secondary | ICD-10-CM

## 2023-10-13 DIAGNOSIS — D509 Iron deficiency anemia, unspecified: Secondary | ICD-10-CM

## 2023-10-13 DIAGNOSIS — R195 Other fecal abnormalities: Secondary | ICD-10-CM

## 2023-10-13 DIAGNOSIS — K625 Hemorrhage of anus and rectum: Secondary | ICD-10-CM

## 2023-10-13 LAB — TISSUE TRANSGLUTAMINASE, IGA: (tTG) Ab, IgA: <1 U/mL

## 2023-10-13 LAB — IGA: Immunoglobulin A: 446 mg/dL — ABNORMAL HIGH (ref 47–310)

## 2023-10-14 ENCOUNTER — Ambulatory Visit: Payer: Self-pay | Admitting: Gastroenterology

## 2023-10-15 ENCOUNTER — Encounter: Payer: Self-pay | Admitting: Gastroenterology

## 2023-10-15 LAB — CALPROTECTIN, FECAL: Calprotectin, Fecal: 415 ug/g — ABNORMAL HIGH (ref 0–120)

## 2023-10-20 ENCOUNTER — Encounter: Payer: Self-pay | Admitting: Family Medicine

## 2023-10-21 ENCOUNTER — Other Ambulatory Visit (HOSPITAL_COMMUNITY): Payer: Self-pay

## 2023-10-21 ENCOUNTER — Other Ambulatory Visit: Payer: Self-pay | Admitting: Family Medicine

## 2023-10-21 MED ORDER — TIRZEPATIDE 2.5 MG/0.5ML ~~LOC~~ SOAJ
2.5000 mg | SUBCUTANEOUS | 0 refills | Status: DC
  Filled 2023-10-21: qty 2, 28d supply, fill #0

## 2023-10-21 MED ORDER — TIRZEPATIDE 5 MG/0.5ML ~~LOC~~ SOAJ
5.0000 mg | SUBCUTANEOUS | 0 refills | Status: DC
  Filled 2023-10-21: qty 6, 84d supply, fill #0
  Filled 2023-11-28: qty 2, 28d supply, fill #0

## 2023-10-21 NOTE — Progress Notes (Signed)
 Cardiology Office Note:  .   Date:  10/22/2023  ID:  Derek Reed, DOB 27-Jan-1986, MRN [id number] PCP: Joaquin Mulberry, MD  [facility] Providers Cardiologist:  Luana Rumple, MD {  History of Present Illness: .   Derek Reed is a 38 y.o. male with history of nonischemic cardiomyopathy with recovered EF, hypertension, CKD, type 2 diabetes, hyperlipidemia.     Nonischemic cardiomyopathy Echocardiogram 02/2019 EF 25 to 30%, right/left heart cath with no CAD.  Normal filling pressures and preserved cardiac output. Etiology felt related to uncontrolled hypertension EF completely normalized on echocardiogram 05/2019 Coronary CTA 04/2020 no evidence of CAD, CAC score 0 Most recent echocardiogram 02/2021 preserved biventricular function with no significant valvular disease  Palpitations Heart monitor August 2023 was normal    Patient with history of nonischemic cardiomyopathy with EF as low as 25 to 30% in 2020 but with heart cath showing no evidence of CAD.  His EF rebounded and completely normalized and January 2021.  Last seen in office February 2025 and was doing well overall.  He was on Mounjaro  for weight loss but this has since been stopped due to recurrent GI related issues with multiple ER visits.  Patient also seen in May 2025 for chest pain with negative troponins x 2.  Patient saw GI in follow up for nausea vomiting and diarrhea.  GI has plans for colonoscopy and requested preoperative evaluation  Today patient presents for preoperative evaluation for colonoscopy.  He reports that he has had sharp chest pain that occurs randomly but despite being seen in the ER 1 month ago he reports that he does not remember the last time that he has had an episode like this.  Reports he is events occur very briefly with no radiating pain or accompanying symptoms such as shortness of breath.  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  But otherwise offers no complaints today.    ROS: Denies: Chest pain, shortness of breath, orthopnea, peripheral edema, palpitations, decreased exercise intolerance, fatigue, lightheadedness.   Studies Reviewed: Aaron Aas    EKG Interpretation Date/Time:  Friday October 22 2023 08:32:03 EDT Ventricular Rate:  62 PR Interval:  176 QRS Duration:  88 QT Interval:  412 QTC Calculation: 418 R Axis:   -9  Text Interpretation: Normal sinus rhythm with sinus arrhythmia Minimal voltage criteria for LVH, may be normal variant ( R in aVL ) When compared with ECG of 24-Sep-2023 18:22, No significant change since last tracing Confirmed by Morgan Arab [phone]) on 10/22/2023 8:36:34 AM    Risk Assessment/Calculations:             Physical Exam:   VS:  BP 104/74   Pulse 64   Ht 5' 6 (1.676 m)   Wt 249 lb (112.9 kg)   SpO2 97%   BMI 40.19 kg/m    Wt Readings from Last 3 Encounters:  10/22/23 249 lb (112.9 kg)  10/12/23 248 lb (112.5 kg)  10/06/23 238 lb (108 kg)    GEN: Well nourished, well developed in no acute distress NECK: No JVD; No carotid bruits CARDIAC: RRR, no murmurs, rubs, gallops RESPIRATORY:  Clear to auscultation without rales, wheezing or rhonchi  ABDOMEN: Soft, non-tender, non-distended EXTREMITIES:  No edema; No deformity   ASSESSMENT AND PLAN: .    Preoperative evaluation for colonoscopy Has had recurrent episodes and frequent ER visits for nausea, vomiting, diarrhea, elevated fecal calprotectin with  plans of colonoscopy.  From a cardiac standpoint he has had had reassuring workup with no evidence of CAD on prior heart cath and coronary CT in 2020/2021 respectively.  He has no evidence of heart failure today and appears to be well compensated.  No further workup is indicated. Will send note to GI  Fax [phone]   Nonischemic cardiomyopathy with recovered EF Felt related to uncontrolled blood pressure.  Has since normalized since January 2021.  Recent echocardiogram  October 2022 with preserved biventricular function with no significant valvular disease.  Overall well compensated and euvolemic. BP is a little soft today but he would like to keep current regiment. GDMT: Continue with carvedilol  6.25 mg twice daily, he takes hydralazine  50 mg twice daily and not 3 times daily, continue Entresto  97-103 mg twice daily, spironolactone  25 mg, potassium 20 mEq twice daily. In the future could consider SGLT2 inhibitor given CKD and type 2 diabetes, but since he has been on Mounjaro  will defer until this is addressed. He has done well off this regiment with stable CKD so we will continue.  CKD Appears to be around baseline.  Type 2 diabetes A1c 6.0% 1 month ago.  He stopped his Mounjaro  given GI related symptoms.  Defer to GI if they want to retrial this.  Hyperlipidemia LDL 45 7 months ago.  Good control.  Continue with atorvastatin  5 mg daily.     Dispo: 1 year  Signed, Burnetta Cart, PA-C

## 2023-10-22 ENCOUNTER — Other Ambulatory Visit (HOSPITAL_COMMUNITY): Payer: Self-pay

## 2023-10-22 ENCOUNTER — Ambulatory Visit: Attending: Physician Assistant | Admitting: Cardiology

## 2023-10-22 ENCOUNTER — Encounter: Payer: Self-pay | Admitting: Physician Assistant

## 2023-10-22 VITALS — BP 104/74 | HR 64 | Ht 66.0 in | Wt 249.0 lb

## 2023-10-22 DIAGNOSIS — I428 Other cardiomyopathies: Secondary | ICD-10-CM

## 2023-10-22 DIAGNOSIS — I10 Essential (primary) hypertension: Secondary | ICD-10-CM

## 2023-10-22 DIAGNOSIS — N182 Chronic kidney disease, stage 2 (mild): Secondary | ICD-10-CM | POA: Diagnosis not present

## 2023-10-22 DIAGNOSIS — E782 Mixed hyperlipidemia: Secondary | ICD-10-CM | POA: Diagnosis not present

## 2023-10-22 DIAGNOSIS — Z01818 Encounter for other preprocedural examination: Secondary | ICD-10-CM

## 2023-10-22 MED ORDER — HYDRALAZINE HCL 50 MG PO TABS
50.0000 mg | ORAL_TABLET | Freq: Two times a day (BID) | ORAL | 0 refills | Status: DC
  Filled 2023-10-22 – 2023-10-27 (×2): qty 90, 45d supply, fill #0
  Filled 2023-12-06: qty 60, 30d supply, fill #0

## 2023-10-22 NOTE — Telephone Encounter (Signed)
 Cardiac clearance provided by Morgan Arab.

## 2023-10-22 NOTE — Patient Instructions (Signed)
 Medication Instructions:  NO CHANGES *If you need a refill on your cardiac medications before your next appointment, please call your pharmacy*  Lab Work: NO LABS If you have labs (blood work) drawn today and your tests are completely normal, you will receive your results only by: MyChart Message (if you have MyChart) OR A paper copy in the mail If you have any lab test that is abnormal or we need to change your treatment, we will call you to review the results.  Testing/Procedures: NO TESTING  Follow-Up: At [facility], you and your health needs are our priority.  As part of our continuing mission to provide you with exceptional heart care, our providers are all part of one team.  This team includes your primary Cardiologist (physician) and Advanced Practice Providers or APPs (Physician Assistants and Nurse Practitioners) who all work together to provide you with the care you need, when you need it.  Your next appointment:   1 year(s)  Provider:   Luana Rumple, MD

## 2023-10-22 NOTE — Telephone Encounter (Signed)
 I think please see my note for preoperative evaluation.  Will send over fax as well.

## 2023-10-25 ENCOUNTER — Encounter: Payer: Self-pay | Admitting: Gastroenterology

## 2023-10-25 ENCOUNTER — Other Ambulatory Visit: Payer: Self-pay | Admitting: Physician Assistant

## 2023-10-25 ENCOUNTER — Ambulatory Visit (AMBULATORY_SURGERY_CENTER): Admitting: Gastroenterology

## 2023-10-25 VITALS — BP 130/76 | HR 57 | Temp 98.1°F | Resp 14 | Ht 66.0 in | Wt 248.0 lb

## 2023-10-25 DIAGNOSIS — K648 Other hemorrhoids: Secondary | ICD-10-CM | POA: Diagnosis not present

## 2023-10-25 DIAGNOSIS — I10 Essential (primary) hypertension: Secondary | ICD-10-CM

## 2023-10-25 DIAGNOSIS — K6389 Other specified diseases of intestine: Secondary | ICD-10-CM

## 2023-10-25 DIAGNOSIS — K529 Noninfective gastroenteritis and colitis, unspecified: Secondary | ICD-10-CM

## 2023-10-25 DIAGNOSIS — K625 Hemorrhage of anus and rectum: Secondary | ICD-10-CM

## 2023-10-25 DIAGNOSIS — K644 Residual hemorrhoidal skin tags: Secondary | ICD-10-CM | POA: Diagnosis not present

## 2023-10-25 DIAGNOSIS — K635 Polyp of colon: Secondary | ICD-10-CM

## 2023-10-25 DIAGNOSIS — R112 Nausea with vomiting, unspecified: Secondary | ICD-10-CM

## 2023-10-25 DIAGNOSIS — D125 Benign neoplasm of sigmoid colon: Secondary | ICD-10-CM

## 2023-10-25 DIAGNOSIS — E876 Hypokalemia: Secondary | ICD-10-CM

## 2023-10-25 DIAGNOSIS — R195 Other fecal abnormalities: Secondary | ICD-10-CM

## 2023-10-25 MED ORDER — SODIUM CHLORIDE 0.9 % IV SOLN: 500.0000 mL | Freq: Once | INTRAVENOUS | Status: DC

## 2023-10-25 NOTE — Progress Notes (Unsigned)
 [facility] History and Physical   Primary Care Physician:  Joaquin Mulberry, MD   Reason for Procedure:  Rectal bleeding  Plan:     colonoscopy with possible interventions as needed     HPI: Derek Reed is a very pleasant 38 y.o. male here for colonoscopy for evaluation of rectal bleeding.   The risks and benefits as well as alternatives of endoscopic procedure(s) have been discussed and reviewed. All questions answered. The patient agrees to proceed.    Past Medical History:  Diagnosis Date   Acute CHF (congestive heart failure) (HCC) 02/24/2019   Anxiety    CKD (chronic kidney disease) stage 2, GFR 60-89 ml/min    Diabetes mellitus without complication (HCC)    Based on A1C. Pt diet controlled.   Essential hypertension 02/24/2019   GAD (generalized anxiety disorder) 03/26/2014   Hyperlipidemia    Hypertension    Obesity    Peritonsillar abscess     Past Surgical History:  Procedure Laterality Date   CHOLECYSTECTOMY     CHOLECYSTECTOMY N/A 01/15/2023   Procedure: LAPAROSCOPIC CHOLECYSTECTOMY;  Surgeon: Dorena Gander, MD;  Location: [facility] OR;  Service: General;  Laterality: N/A;   INCISION AND DRAINAGE OF PERITONSILLAR ABCESS     RIGHT/LEFT HEART CATH AND CORONARY ANGIOGRAPHY N/A 02/24/2019   Procedure: RIGHT/LEFT HEART CATH AND CORONARY ANGIOGRAPHY;  Surgeon: Darlis Eisenmenger, MD;  Location: [facility] INVASIVE CV LAB;  Service: Cardiovascular;  Laterality: N/A;    Prior to Admission medications   Medication Sig Start Date End Date Taking? Authorizing Provider  amoxicillin  (AMOXIL ) 500 MG capsule Take 2 capsules (1,000 mg total) by mouth 2 (two) times daily. 10/06/23  Yes Alissa April, MD  atorvastatin  (LIPITOR) 10 MG tablet Take 0.5 tablets (5 mg total) by mouth daily. 06/17/23  Yes Dulce Gibbs M, PA-C  carvedilol  (COREG ) 6.25 MG tablet Take 1 tablet (6.25 mg total) by mouth 2 (two) times daily with a meal. 09/14/23  Yes Newlin, Enobong, MD  hydrALAZINE   (APRESOLINE ) 50 MG tablet Take 1 tablet (50 mg total) by mouth 2 (two) times daily. 10/22/23  Yes Burnetta Cart, PA-C  potassium chloride  SA (KLOR-CON  M) 20 MEQ tablet Take 1 tablet (20 mEq total) by mouth 2 (two) times daily. 09/14/23  Yes Newlin, Enobong, MD  sacubitril -valsartan  (ENTRESTO ) 97-103 MG Take 1 tablet by mouth 2 (two) times daily. 09/14/23  Yes Newlin, Enobong, MD  spironolactone  (ALDACTONE ) 25 MG tablet Take 1 tablet (25 mg total) by mouth daily. 09/14/23  Yes Newlin, Enobong, MD  Iron , Ferrous Sulfate , 325 (65 Fe) MG TABS Take 1 tablet by mouth every other day. 07/12/23   Mayers, Cari S, PA-C  sildenafil  (VIAGRA ) 50 MG tablet Take 1 tablet (50 mg total) by mouth daily as needed for erectile dysfunction. 07/07/23   Croitoru, Karyl Paget, MD  tirzepatide  (MOUNJARO ) 2.5 MG/0.5ML Pen Inject 2.5 mg into the skin once a week for 4 weeks then increase to 5 mg once a week 10/21/23   Newlin, Enobong, MD  tirzepatide  (MOUNJARO ) 5 MG/0.5ML Pen Inject 5 mg into the skin once a week. For 4 weeks then increase to 7.5 mg once a week 10/21/23   Newlin, Enobong, MD    Current Outpatient Medications  Medication Sig Dispense Refill   amoxicillin  (AMOXIL ) 500 MG capsule Take 2 capsules (1,000 mg total) by mouth 2 (two) times daily. 40 capsule 0   atorvastatin  (LIPITOR) 10 MG tablet Take 0.5 tablets (5 mg total) by mouth daily. 45 tablet 1  carvedilol  (COREG ) 6.25 MG tablet Take 1 tablet (6.25 mg total) by mouth 2 (two) times daily with a meal. 180 tablet 1   hydrALAZINE  (APRESOLINE ) 50 MG tablet Take 1 tablet (50 mg total) by mouth 2 (two) times daily. 90 tablet 0   potassium chloride  SA (KLOR-CON  M) 20 MEQ tablet Take 1 tablet (20 mEq total) by mouth 2 (two) times daily. 180 tablet 1   sacubitril -valsartan  (ENTRESTO ) 97-103 MG Take 1 tablet by mouth 2 (two) times daily. 180 tablet 1   spironolactone  (ALDACTONE ) 25 MG tablet Take 1 tablet (25 mg total) by mouth daily. 90 tablet 1   Iron , Ferrous Sulfate , 325 (65 Fe)  MG TABS Take 1 tablet by mouth every other day. 45 tablet 0   sildenafil  (VIAGRA ) 50 MG tablet Take 1 tablet (50 mg total) by mouth daily as needed for erectile dysfunction. 30 tablet 5   tirzepatide  (MOUNJARO ) 2.5 MG/0.5ML Pen Inject 2.5 mg into the skin once a week for 4 weeks then increase to 5 mg once a week 2 mL 0   tirzepatide  (MOUNJARO ) 5 MG/0.5ML Pen Inject 5 mg into the skin once a week. For 4 weeks then increase to 7.5 mg once a week 6 mL 0   Current Facility-Administered Medications  Medication Dose Route Frequency Provider Last Rate Last Admin   0.9 %  sodium chloride  infusion  500 mL Intravenous Once Kinzie Wickes V, MD        Allergies as of 10/25/2023 - Review Complete 10/25/2023  Allergen Reaction Noted   Egg-derived products Nausea And Vomiting 12/05/2022    Family History  Problem Relation Age of Onset   Hyperlipidemia Mother    Diabetes Mother    Stroke Mother    Hypertension Mother    Hypertension Father     Social History   Socioeconomic History   Marital status: Married    Spouse name: Not on file   Number of children: Not on file   Years of education: Not on file   Highest education level: Not on file  Occupational History   Not on file  Tobacco Use   Smoking status: Former   Smokeless tobacco: Never  Vaping Use   Vaping status: Never Used  Substance and Sexual Activity   Alcohol use: No   Drug use: No   Sexual activity: Not Currently  Other Topics Concern   Not on file  Social History Narrative   Not on file   Social Drivers of Health   Financial Resource Strain: Medium Risk (06/17/2023)   Overall Financial Resource Strain (CARDIA)    Difficulty of Paying Living Expenses: Somewhat hard  Food Insecurity: No Food Insecurity (09/09/2023)   Hunger Vital Sign    Worried About Running Out of Food in the Last Year: Never true    Ran Out of Food in the Last Year: Never true  Transportation Needs: No Transportation Needs (09/09/2023)   PRAPARE -  Administrator, Civil Service (Medical): No    Lack of Transportation (Non-Medical): No  Physical Activity: Inactive (06/17/2023)   Exercise Vital Sign    Days of Exercise per Week: 0 days    Minutes of Exercise per Session: 0 min  Stress: No Stress Concern Present (06/17/2023)   [employer] of Occupational Health - Occupational Stress Questionnaire    Feeling of Stress : Only a little  Social Connections: Socially Isolated (06/17/2023)   Social Connection and Isolation Panel    Frequency of Communication  with Friends and Family: Never    Frequency of Social Gatherings with Friends and Family: Never    Attends Religious Services: Never    Database administrator or Organizations: No    Attends Banker Meetings: Never    Marital Status: Living with partner  Intimate Partner Violence: Not At Risk (09/09/2023)   Humiliation, Afraid, Rape, and Kick questionnaire    Fear of Current or Ex-Partner: No    Emotionally Abused: No    Physically Abused: No    Sexually Abused: No    Review of Systems:  All other review of systems negative except as mentioned in the HPI.  Physical Exam: Vital signs in last 24 hours: BP 131/86   Pulse 70   Temp 98.1 F (36.7 C) (Temporal)   Ht 5' 6 (1.676 m)   Wt 248 lb (112.5 kg)   SpO2 97%   BMI 40.03 kg/m  General:   Alert, NAD Lungs:  Clear .   Heart:  Regular rate and rhythm Abdomen:  Soft, nontender and nondistended. Neuro/Psych:  Alert and cooperative. Normal mood and affect. A and O x 3  Reviewed labs, radiology imaging, old records and pertinent past GI work up  Patient is appropriate for planned procedure(s) and anesthesia in an ambulatory setting   K. Veena Jayse Hodkinson , MD [phone]

## 2023-10-25 NOTE — Progress Notes (Unsigned)
 Called to room to assist during endoscopic procedure.  Patient ID and intended procedure confirmed with present staff. Received instructions for my participation in the procedure from the performing physician.

## 2023-10-25 NOTE — Patient Instructions (Addendum)
-  Work note provided -Handout on polyps and hemorrhoids provided -Avoid NSAID's (ibuprofen , naproxen )   YOU HAD AN ENDOSCOPIC PROCEDURE TODAY AT THE [facility]:   Refer to the procedure report that was given to you for any specific questions about what was found during the examination.  If the procedure report does not answer your questions, please call your gastroenterologist to clarify.  If you requested that your care partner not be given the details of your procedure findings, then the procedure report has been included in a sealed envelope for you to review at your convenience later.  YOU SHOULD EXPECT: Some feelings of bloating in the abdomen. Passage of more gas than usual.  Walking can help get rid of the air that was put into your GI tract during the procedure and reduce the bloating. If you had a lower endoscopy (such as a colonoscopy or flexible sigmoidoscopy) you may notice spotting of blood in your stool or on the toilet paper. If you underwent a bowel prep for your procedure, you may not have a normal bowel movement for a few days.  Please Note:  You might notice some irritation and congestion in your nose or some drainage.  This is from the oxygen used during your procedure.  There is no need for concern and it should clear up in a day or so.  SYMPTOMS TO REPORT IMMEDIATELY:  Following lower endoscopy (colonoscopy or flexible sigmoidoscopy):  Excessive amounts of blood in the stool  Significant tenderness or worsening of abdominal pains  Swelling of the abdomen that is new, acute  Fever of 100F or higher  For urgent or emergent issues, a gastroenterologist can be reached at any hour by calling [phone]. Do not use MyChart messaging for urgent concerns.    DIET:  We do recommend a small meal at first, but then you may proceed to your regular diet.  Drink plenty of fluids but you should avoid alcoholic beverages for 24 hours.  ACTIVITY:  You should plan to  take it easy for the rest of today and you should NOT DRIVE or use heavy machinery until tomorrow (because of the sedation medicines used during the test).    FOLLOW UP: Our staff will call the number listed on your records the next business day following your procedure.  We will call around 7:15- 8:00 am to check on you and address any questions or concerns that you may have regarding the information given to you following your procedure. If we do not reach you, we will leave a message.     If any biopsies were taken you will be contacted by phone or by letter within the next 1-3 weeks.  Please call us  at [phone] if you have not heard about the biopsies in 3 weeks.    SIGNATURES/CONFIDENTIALITY: You and/or your care partner have signed paperwork which will be entered into your electronic medical record.  These signatures attest to the fact that that the information above on your After Visit Summary has been reviewed and is understood.  Full responsibility of the confidentiality of this discharge information lies with you and/or your care-partner.

## 2023-10-25 NOTE — Op Note (Signed)
 [facility] Patient Name: Derek Reed Procedure Date: 10/25/2023 1:34 PM MRN: [id number] Endoscopist: Sergio Dandy , MD, [id number] Age: 37 Referring MD:  Date of Birth: 1985/07/11 Gender: Male Account #: [id number] Procedure:                Colonoscopy Indications:              Evaluation of unexplained GI bleeding presenting                            with fecal occult blood, Iron  deficiency anemia                            secondary to chronic blood loss, rectal bleeding,                            elevated fecal calprotectin Medicines:                Monitored Anesthesia Care Procedure:                Pre-Anesthesia Assessment:                           - Prior to the procedure, a History and Physical                            was performed, and patient medications and                            allergies were reviewed. The patient's tolerance of                            previous anesthesia was also reviewed. The risks                            and benefits of the procedure and the sedation                            options and risks were discussed with the patient.                            All questions were answered, and informed consent                            was obtained. Prior Anticoagulants: The patient has                            taken no anticoagulant or antiplatelet agents. ASA                            Grade Assessment: II - A patient with mild systemic                            disease. After reviewing the risks and benefits,  the patient was deemed in satisfactory condition to                            undergo the procedure.                           After obtaining informed consent, the colonoscope                            was passed under direct vision. Throughout the                            procedure, the patient's blood pressure, pulse, and                            oxygen saturations were  monitored continuously. The                            Olympus Scope [id number] was introduced through the                            anus and advanced to the the cecum, identified by                            appendiceal orifice and ileocecal valve. The                            colonoscopy was performed without difficulty. The                            patient tolerated the procedure well. The quality                            of the bowel preparation was good. The ileocecal                            valve, appendiceal orifice, and rectum were                            photographed. Scope In: 1:38:09 PM Scope Out: 1:56:13 PM Scope Withdrawal Time: 0 hours 14 minutes 1 second  Total Procedure Duration: 0 hours 18 minutes 4 seconds  Findings:                 The perianal and digital rectal examinations were                            normal.                           A 3 mm polyp was found in the sigmoid colon. The                            polyp was sessile. The polyp was removed with a  cold snare. Resection and retrieval were complete.                           Normal mucosa was found in the entire colon.                            Biopsies were taken with a cold forceps for                            histology from right and left colon.                           The terminal ileum contained multiple ulcers. No                            bleeding was present. Biopsies were taken with a                            cold forceps for histology.                           Non-bleeding external and internal hemorrhoids were                            found during retroflexion. The hemorrhoids were                            medium-sized. Complications:            No immediate complications. Estimated Blood Loss:     Estimated blood loss was minimal. Impression:               - One 3 mm polyp in the sigmoid colon, removed with                            a cold  snare. Resected and retrieved.                           - Normal mucosa in the entire examined colon.                            Biopsied.                           - Multiple ulcers in the terminal ileum. Biopsied.                           - Non-bleeding external and internal hemorrhoids. Recommendation:           - Resume previous diet.                           - Continue present medications.                           - Await pathology results.                           -  Avoid NSAID's                           - Repeat colonoscopy in 5 years for surveillance                            based on pathology results. Deloros Beretta V. Xiadani Damman, MD 10/25/2023 2:04:52 PM This report has been signed electronically.

## 2023-10-25 NOTE — Progress Notes (Unsigned)
 Vss nad trans to pacu

## 2023-10-26 ENCOUNTER — Telehealth: Payer: Self-pay | Admitting: *Deleted

## 2023-10-26 ENCOUNTER — Encounter: Payer: Self-pay | Admitting: Gastroenterology

## 2023-10-26 NOTE — Telephone Encounter (Signed)
 Attempted post procedure follow up call.  No answer - LVM.

## 2023-10-27 ENCOUNTER — Other Ambulatory Visit: Payer: Self-pay | Admitting: Family Medicine

## 2023-10-27 ENCOUNTER — Other Ambulatory Visit (HOSPITAL_COMMUNITY): Payer: Self-pay

## 2023-10-27 ENCOUNTER — Other Ambulatory Visit: Payer: Self-pay

## 2023-10-27 DIAGNOSIS — D509 Iron deficiency anemia, unspecified: Secondary | ICD-10-CM

## 2023-10-27 MED ORDER — IRON (FERROUS SULFATE) 325 (65 FE) MG PO TABS
325.0000 mg | ORAL_TABLET | ORAL | 0 refills | Status: DC
  Filled 2023-10-27: qty 45, 90d supply, fill #0

## 2023-10-28 ENCOUNTER — Other Ambulatory Visit (HOSPITAL_COMMUNITY): Payer: Self-pay

## 2023-10-28 LAB — SURGICAL PATHOLOGY

## 2023-11-01 ENCOUNTER — Encounter: Payer: Self-pay | Admitting: Family Medicine

## 2023-11-01 ENCOUNTER — Encounter: Payer: Self-pay | Admitting: Gastroenterology

## 2023-11-01 ENCOUNTER — Other Ambulatory Visit (HOSPITAL_COMMUNITY): Payer: Self-pay

## 2023-11-06 ENCOUNTER — Emergency Department (HOSPITAL_BASED_OUTPATIENT_CLINIC_OR_DEPARTMENT_OTHER)
Admission: EM | Admit: 2023-11-06 | Discharge: 2023-11-07 | Disposition: A | Discharge status: Home / Self Care | Attending: Emergency Medicine | Admitting: Emergency Medicine

## 2023-11-06 ENCOUNTER — Other Ambulatory Visit: Payer: Self-pay

## 2023-11-06 ENCOUNTER — Encounter (HOSPITAL_BASED_OUTPATIENT_CLINIC_OR_DEPARTMENT_OTHER): Payer: Self-pay | Admitting: Emergency Medicine

## 2023-11-06 DIAGNOSIS — E860 Dehydration: Secondary | ICD-10-CM | POA: Insufficient documentation

## 2023-11-06 DIAGNOSIS — R5383 Other fatigue: Secondary | ICD-10-CM | POA: Diagnosis present

## 2023-11-06 LAB — CBC WITH DIFFERENTIAL/PLATELET
Abs Immature Granulocytes: 0.04 10*3/uL (ref 0.00–0.07)
Basophils Absolute: 0 10*3/uL (ref 0.0–0.1)
Basophils Relative: 0 %
Eosinophils Absolute: 0.4 10*3/uL (ref 0.0–0.5)
Eosinophils Relative: 5 %
HCT: 35.8 % — ABNORMAL LOW (ref 39.0–52.0)
Hemoglobin: 11.3 g/dL — ABNORMAL LOW (ref 13.0–17.0)
Immature Granulocytes: 1 %
Lymphocytes Relative: 44 %
Lymphs Abs: 3.1 10*3/uL (ref 0.7–4.0)
MCH: 23.3 pg — ABNORMAL LOW (ref 26.0–34.0)
MCHC: 31.6 g/dL (ref 30.0–36.0)
MCV: 74 fL — ABNORMAL LOW (ref 80.0–100.0)
Monocytes Absolute: 0.9 10*3/uL (ref 0.1–1.0)
Monocytes Relative: 12 %
Neutro Abs: 2.7 10*3/uL (ref 1.7–7.7)
Neutrophils Relative %: 38 %
Platelets: 287 10*3/uL (ref 150–400)
RBC: 4.84 MIL/uL (ref 4.22–5.81)
RDW: 16.1 % — ABNORMAL HIGH (ref 11.5–15.5)
WBC: 7.1 10*3/uL (ref 4.0–10.5)
nRBC: 0 % (ref 0.0–0.2)

## 2023-11-06 LAB — CBG MONITORING, ED: Glucose-Capillary: 135 mg/dL — ABNORMAL HIGH (ref 70–99)

## 2023-11-06 LAB — RESP PANEL BY RT-PCR (RSV, FLU A&B, COVID)  RVPGX2
Influenza A by PCR: NEGATIVE
Influenza B by PCR: NEGATIVE
Resp Syncytial Virus by PCR: NEGATIVE
SARS Coronavirus 2 by RT PCR: NEGATIVE

## 2023-11-06 MED ORDER — SODIUM CHLORIDE 0.9 % IV BOLUS
500.0000 mL | Freq: Once | INTRAVENOUS | Status: AC
  Administered 2023-11-06: 500 mL via INTRAVENOUS

## 2023-11-06 NOTE — ED Triage Notes (Signed)
 Pt sts I feel like I'm dehydrated; c/o fatigue, slept most of the day, dry mouth

## 2023-11-06 NOTE — ED Provider Notes (Signed)
 [place] [facility] Provider Note   CSN: [id number] Arrival date & time: 11/06/23  2240     Patient presents with: Fatigue   Derek Reed is a 38 y.o. male.  {Add pertinent medical, surgical, social history, OB history to YEP:67052} Patient presents to the emergency department stating that he thinks he is dehydrated.  He reports that he slept all day which is unusual.  He reports his mouth is very dry.  He has been able to drink today and has been making urine.  He denies pain.  No URI symptoms.  Denies vomiting and diarrhea.       Prior to Admission medications   Medication Sig Start Date End Date Taking? Authorizing Provider  amoxicillin  (AMOXIL ) 500 MG capsule Take 2 capsules (1,000 mg total) by mouth 2 (two) times daily. 10/06/23   Raford Lenis, MD  atorvastatin  (LIPITOR) 10 MG tablet Take 0.5 tablets (5 mg total) by mouth daily. 06/17/23   Danton Jon HERO, PA-C  carvedilol  (COREG ) 6.25 MG tablet Take 1 tablet (6.25 mg total) by mouth 2 (two) times daily with a meal. 09/14/23   Delbert Clam, MD  hydrALAZINE  (APRESOLINE ) 50 MG tablet Take 1 tablet (50 mg total) by mouth 2 (two) times daily. 10/22/23   Darryle Thom CROME, PA-C  Iron , Ferrous Sulfate , 325 (65 Fe) MG TABS Take 1 tablet by mouth every other day. 10/27/23   Newlin, Enobong, MD  potassium chloride  SA (KLOR-CON  M20) 20 MEQ tablet TAKE 1 TABLET BY MOUTH 2 TIMES A DAY 10/26/23   Newlin, Enobong, MD  sacubitril -valsartan  (ENTRESTO ) 97-103 MG Take 1 tablet by mouth 2 (two) times daily. 09/14/23   Newlin, Enobong, MD  sildenafil  (VIAGRA ) 50 MG tablet Take 1 tablet (50 mg total) by mouth daily as needed for erectile dysfunction. 07/07/23   Croitoru, Mihai, MD  spironolactone  (ALDACTONE ) 25 MG tablet Take 1 tablet (25 mg total) by mouth daily. 09/14/23   Newlin, Enobong, MD  tirzepatide  (MOUNJARO ) 2.5 MG/0.5ML Pen Inject 2.5 mg into the skin once a week for 4 weeks then increase to 5 mg once a week  10/21/23   Newlin, Enobong, MD  tirzepatide  (MOUNJARO ) 5 MG/0.5ML Pen Inject 5 mg into the skin once a week. For 4 weeks then increase to 7.5 mg once a week 10/21/23   Newlin, Enobong, MD    Allergies: Egg-derived products    Review of Systems  Updated Vital Signs BP (!) 115/91 (BP Location: Left Arm)   Pulse 64   Temp (!) 97.5 F (36.4 C)   Resp 14   Ht 5' 6 (1.676 m)   Wt 112.5 kg   SpO2 100%   BMI 40.03 kg/m   Physical Exam Vitals and nursing note reviewed.  Constitutional:      General: He is not in acute distress.    Appearance: He is well-developed.  HENT:     Head: Normocephalic and atraumatic.     Mouth/Throat:     Mouth: Mucous membranes are moist.   Eyes:     General: Vision grossly intact. Gaze aligned appropriately.     Extraocular Movements: Extraocular movements intact.     Conjunctiva/sclera: Conjunctivae normal.    Cardiovascular:     Rate and Rhythm: Normal rate and regular rhythm.     Pulses: Normal pulses.     Heart sounds: Normal heart sounds, S1 normal and S2 normal. No murmur heard.    No friction rub. No gallop.  Pulmonary:  Effort: Pulmonary effort is normal. No respiratory distress.     Breath sounds: Normal breath sounds.  Abdominal:     Palpations: Abdomen is soft.     Tenderness: There is no abdominal tenderness. There is no guarding or rebound.     Hernia: No hernia is present.   Musculoskeletal:        General: No swelling.     Cervical back: Full passive range of motion without pain, normal range of motion and neck supple. No pain with movement, spinous process tenderness or muscular tenderness. Normal range of motion.     Right lower leg: No edema.     Left lower leg: No edema.   Skin:    General: Skin is warm and dry.     Capillary Refill: Capillary refill takes less than 2 seconds.     Findings: No ecchymosis, erythema, lesion or wound.   Neurological:     Mental Status: He is alert and oriented to person, place, and  time.     GCS: GCS eye subscore is 4. GCS verbal subscore is 5. GCS motor subscore is 6.     Cranial Nerves: Cranial nerves 2-12 are intact.     Sensory: Sensation is intact.     Motor: Motor function is intact. No weakness or abnormal muscle tone.     Coordination: Coordination is intact.   Psychiatric:        Mood and Affect: Mood normal.        Speech: Speech normal.        Behavior: Behavior normal.     (all labs ordered are listed, but only abnormal results are displayed) Labs Reviewed  CBG MONITORING, ED - Abnormal; Notable for the following components:      Result Value   Glucose-Capillary 135 (*)    All other components within normal limits  RESP PANEL BY RT-PCR (RSV, FLU A&B, COVID)  RVPGX2  CBC WITH DIFFERENTIAL/PLATELET  COMPREHENSIVE METABOLIC PANEL WITH GFR  URINALYSIS, ROUTINE W REFLEX MICROSCOPIC  CBG MONITORING, ED    EKG: None  Radiology: No results found.  {Document cardiac monitor, telemetry assessment procedure when appropriate:32947} Procedures   Medications Ordered in the ED  sodium chloride  0.9 % bolus 500 mL (has no administration in time range)      {Click here for ABCD2, HEART and other calculators REFRESH Note before signing:1}                              Medical Decision Making Amount and/or Complexity of Data Reviewed Labs: ordered.   ***  {Document critical care time when appropriate  Document review of labs and clinical decision tools ie CHADS2VASC2, etc  Document your independent review of radiology images and any outside records  Document your discussion with family members, caretakers and with consultants  Document social determinants of health affecting pt's care  Document your decision making why or why not admission, treatments were needed:32947:::1}   Final diagnoses:  None    ED Discharge Orders     None

## 2023-11-07 LAB — URINALYSIS, ROUTINE W REFLEX MICROSCOPIC
Bilirubin Urine: NEGATIVE
Glucose, UA: NEGATIVE mg/dL
Hgb urine dipstick: NEGATIVE
Ketones, ur: NEGATIVE mg/dL
Leukocytes,Ua: NEGATIVE
Nitrite: NEGATIVE
Protein, ur: NEGATIVE mg/dL
Specific Gravity, Urine: 1.01 (ref 1.005–1.030)
pH: 5.5 (ref 5.0–8.0)

## 2023-11-07 LAB — COMPREHENSIVE METABOLIC PANEL WITH GFR
ALT: 11 U/L (ref 0–44)
AST: 15 U/L (ref 15–41)
Albumin: 3.8 g/dL (ref 3.5–5.0)
Alkaline Phosphatase: 86 U/L (ref 38–126)
Anion gap: 10 (ref 5–15)
BUN: 11 mg/dL (ref 6–20)
CO2: 23 mmol/L (ref 22–32)
Calcium: 8.5 mg/dL — ABNORMAL LOW (ref 8.9–10.3)
Chloride: 106 mmol/L (ref 98–111)
Creatinine, Ser: 1.48 mg/dL — ABNORMAL HIGH (ref 0.61–1.24)
GFR, Estimated: >60 mL/min (ref >60)
Glucose, Bld: 115 mg/dL — ABNORMAL HIGH (ref 70–99)
Potassium: 3.5 mmol/L (ref 3.5–5.1)
Sodium: 139 mmol/L (ref 135–145)
Total Bilirubin: <0.2 mg/dL (ref 0.0–1.2)
Total Protein: 7 g/dL (ref 6.5–8.1)

## 2023-11-11 ENCOUNTER — Telehealth: Payer: Self-pay | Admitting: Gastroenterology

## 2023-11-11 NOTE — Telephone Encounter (Signed)
 Spoke with patient. Let him know provider has reviewed colonoscopy results at this time. We will call him back with results and recommendations once the doctor has finalized it.

## 2023-11-11 NOTE — Telephone Encounter (Signed)
 Patient is calling due to him having question regarding the results of his colonoscopy. Patient is requesting a call back.Please advise.

## 2023-11-18 ENCOUNTER — Other Ambulatory Visit (HOSPITAL_COMMUNITY): Payer: Self-pay

## 2023-11-22 ENCOUNTER — Other Ambulatory Visit (HOSPITAL_COMMUNITY): Payer: Self-pay

## 2023-11-22 ENCOUNTER — Other Ambulatory Visit: Payer: Self-pay

## 2023-11-29 ENCOUNTER — Other Ambulatory Visit (HOSPITAL_COMMUNITY): Payer: Self-pay

## 2023-12-02 ENCOUNTER — Encounter: Payer: Self-pay | Admitting: Family Medicine

## 2023-12-02 ENCOUNTER — Other Ambulatory Visit: Payer: Self-pay | Admitting: Family Medicine

## 2023-12-02 MED ORDER — TIRZEPATIDE 2.5 MG/0.5ML ~~LOC~~ SOAJ
2.5000 mg | SUBCUTANEOUS | 1 refills | Status: DC
  Filled 2023-12-02 – 2023-12-22 (×4): qty 2, 28d supply, fill #0

## 2023-12-03 ENCOUNTER — Other Ambulatory Visit (HOSPITAL_COMMUNITY): Payer: Self-pay

## 2023-12-06 ENCOUNTER — Other Ambulatory Visit (HOSPITAL_COMMUNITY): Payer: Self-pay

## 2023-12-07 ENCOUNTER — Other Ambulatory Visit (HOSPITAL_COMMUNITY): Payer: Self-pay

## 2023-12-09 ENCOUNTER — Other Ambulatory Visit (HOSPITAL_COMMUNITY): Payer: Self-pay

## 2023-12-10 ENCOUNTER — Emergency Department (HOSPITAL_BASED_OUTPATIENT_CLINIC_OR_DEPARTMENT_OTHER)
Admission: EM | Admit: 2023-12-10 | Discharge: 2023-12-10 | Disposition: A | Discharge status: Home / Self Care | Attending: Emergency Medicine | Admitting: Emergency Medicine

## 2023-12-10 ENCOUNTER — Other Ambulatory Visit: Payer: Self-pay

## 2023-12-10 ENCOUNTER — Emergency Department (HOSPITAL_BASED_OUTPATIENT_CLINIC_OR_DEPARTMENT_OTHER)

## 2023-12-10 ENCOUNTER — Encounter (HOSPITAL_BASED_OUTPATIENT_CLINIC_OR_DEPARTMENT_OTHER): Payer: Self-pay | Admitting: Emergency Medicine

## 2023-12-10 DIAGNOSIS — N182 Chronic kidney disease, stage 2 (mild): Secondary | ICD-10-CM | POA: Diagnosis not present

## 2023-12-10 DIAGNOSIS — I130 Hypertensive heart and chronic kidney disease with heart failure and stage 1 through stage 4 chronic kidney disease, or unspecified chronic kidney disease: Secondary | ICD-10-CM | POA: Insufficient documentation

## 2023-12-10 DIAGNOSIS — R079 Chest pain, unspecified: Secondary | ICD-10-CM | POA: Diagnosis present

## 2023-12-10 DIAGNOSIS — E1122 Type 2 diabetes mellitus with diabetic chronic kidney disease: Secondary | ICD-10-CM | POA: Diagnosis not present

## 2023-12-10 DIAGNOSIS — I509 Heart failure, unspecified: Secondary | ICD-10-CM | POA: Insufficient documentation

## 2023-12-10 DIAGNOSIS — Z79899 Other long term (current) drug therapy: Secondary | ICD-10-CM | POA: Diagnosis not present

## 2023-12-10 LAB — BASIC METABOLIC PANEL WITH GFR
Anion gap: 10 (ref 5–15)
BUN: 11 mg/dL (ref 6–20)
CO2: 22 mmol/L (ref 22–32)
Calcium: 8.9 mg/dL (ref 8.9–10.3)
Chloride: 108 mmol/L (ref 98–111)
Creatinine, Ser: 1.54 mg/dL — ABNORMAL HIGH (ref 0.61–1.24)
GFR, Estimated: 58 mL/min — ABNORMAL LOW (ref >60)
Glucose, Bld: 133 mg/dL — ABNORMAL HIGH (ref 70–99)
Potassium: 3.8 mmol/L (ref 3.5–5.1)
Sodium: 140 mmol/L (ref 135–145)

## 2023-12-10 LAB — CBC
HCT: 37.3 % — ABNORMAL LOW (ref 39.0–52.0)
Hemoglobin: 11.9 g/dL — ABNORMAL LOW (ref 13.0–17.0)
MCH: 23.2 pg — ABNORMAL LOW (ref 26.0–34.0)
MCHC: 31.9 g/dL (ref 30.0–36.0)
MCV: 72.6 fL — ABNORMAL LOW (ref 80.0–100.0)
Platelets: 257 K/uL (ref 150–400)
RBC: 5.14 MIL/uL (ref 4.22–5.81)
RDW: 14.9 % (ref 11.5–15.5)
WBC: 6.3 K/uL (ref 4.0–10.5)
nRBC: 0 % (ref 0.0–0.2)

## 2023-12-10 LAB — TROPONIN T, HIGH SENSITIVITY
Troponin T High Sensitivity: <15 ng/L (ref <19)
Troponin T High Sensitivity: <15 ng/L (ref <19)

## 2023-12-10 LAB — PRO BRAIN NATRIURETIC PEPTIDE: Pro Brain Natriuretic Peptide: 41.9 pg/mL (ref <300.0)

## 2023-12-10 NOTE — ED Triage Notes (Signed)
 Pt states left side chest pain x 1 hour, denies any SOB or N/V   H/o CHF per pt

## 2023-12-10 NOTE — Discharge Instructions (Addendum)
 Your second troponin (heart number) is normal and we do not see signs of heart attack, heart failure. Please call your doctor for follow up and return for new or worsening symptoms.

## 2023-12-10 NOTE — ED Provider Notes (Signed)
 [place] [facility] Provider Note   CSN: [id number] Arrival date & time: 12/10/23  1723     Patient presents with: Chest Pain   Derek Reed is a 38 y.o. male.   HPI     38yo male with history of nonischemic cardiomyopathy 25% EF 2020, rebounded ECHO 2021, past DM on Mounjaro , hyperlipidemia, hypertension, GAD, presents with concern for chest pain.  Began one hour prior to arrival, feels improved, lasted a few minutes, felt like tightness, didn't radiate No dyspnea, nausea, sweating, dizziness, no leg pain or swelling, no cough or fever. No abdominal pain. No dyspnea with laying down flat.   If sit still if start taking deep breaths takes better. Has been overworking self, working 17 hours a day, not having time to sleep   No long trips car or airplane last 2 months, no hx of DVT/PE, no recent surgeries  No fam hx of blood clots, mom has heart problems multiple strokes, thinks it was over 55 No smoking, cigarettes or other drugs    Past Medical History:  Diagnosis Date   Acute CHF (congestive heart failure) (HCC) 02/24/2019   Anxiety    CKD (chronic kidney disease) stage 2, GFR 60-89 ml/min    Diabetes mellitus without complication (HCC)    Based on A1C. Pt diet controlled.   Essential hypertension 02/24/2019   GAD (generalized anxiety disorder) 03/26/2014   Hyperlipidemia    Hypertension    Obesity    Peritonsillar abscess      Prior to Admission medications   Medication Sig Start Date End Date Taking? Authorizing Provider  amoxicillin  (AMOXIL ) 500 MG capsule Take 2 capsules (1,000 mg total) by mouth 2 (two) times daily. 10/06/23   Raford Lenis, MD  atorvastatin  (LIPITOR) 10 MG tablet Take 0.5 tablets (5 mg total) by mouth daily. 06/17/23   Danton Jon HERO, PA-C  carvedilol  (COREG ) 6.25 MG tablet Take 1 tablet (6.25 mg total) by mouth 2 (two) times daily with a meal. 09/14/23   Delbert Clam, MD  hydrALAZINE  (APRESOLINE ) 50 MG  tablet Take 1 tablet (50 mg total) by mouth 2 (two) times daily. 10/22/23   Darryle Thom CROME, PA-C  Iron , Ferrous Sulfate , 325 (65 Fe) MG TABS Take 1 tablet by mouth every other day. 10/27/23   Newlin, Enobong, MD  potassium chloride  SA (KLOR-CON  M20) 20 MEQ tablet TAKE 1 TABLET BY MOUTH 2 TIMES A DAY 10/26/23   Newlin, Enobong, MD  sacubitril -valsartan  (ENTRESTO ) 97-103 MG Take 1 tablet by mouth 2 (two) times daily. 09/14/23   Newlin, Enobong, MD  sildenafil  (VIAGRA ) 50 MG tablet Take 1 tablet (50 mg total) by mouth daily as needed for erectile dysfunction. 07/07/23   Croitoru, Mihai, MD  spironolactone  (ALDACTONE ) 25 MG tablet Take 1 tablet (25 mg total) by mouth daily. 09/14/23   Newlin, Enobong, MD  tirzepatide  (MOUNJARO ) 2.5 MG/0.5ML Pen Inject 2.5 mg into the skin once a week. 12/02/23   Newlin, Enobong, MD    Allergies: Egg-derived products    Review of Systems  Updated Vital Signs BP 122/87   Pulse 69   Temp 97.6 F (36.4 C) (Oral)   Resp 15   Ht 5' 6 (1.676 m)   Wt 112.5 kg   SpO2 99%   BMI 40.03 kg/m   Physical Exam Vitals and nursing note reviewed.  Constitutional:      General: He is not in acute distress.    Appearance: He is well-developed. He is  not diaphoretic.  HENT:     Head: Normocephalic and atraumatic.  Eyes:     Conjunctiva/sclera: Conjunctivae normal.  Cardiovascular:     Rate and Rhythm: Normal rate and regular rhythm.     Heart sounds: Normal heart sounds. No murmur heard.    No friction rub. No gallop.     Comments: Equal bilateral upper and lower extremity pulses Pulmonary:     Effort: Pulmonary effort is normal. No respiratory distress.     Breath sounds: Normal breath sounds. No wheezing or rales.  Abdominal:     General: There is no distension.     Palpations: Abdomen is soft.     Tenderness: There is no abdominal tenderness. There is no guarding.  Musculoskeletal:     Cervical back: Normal range of motion.  Skin:    General: Skin is warm and dry.   Neurological:     Mental Status: He is alert and oriented to person, place, and time.     (all labs ordered are listed, but only abnormal results are displayed) Labs Reviewed  BASIC METABOLIC PANEL WITH GFR - Abnormal; Notable for the following components:      Result Value   Glucose, Bld 133 (*)    Creatinine, Ser 1.54 (*)    GFR, Estimated 58 (*)    All other components within normal limits  CBC - Abnormal; Notable for the following components:   Hemoglobin 11.9 (*)    HCT 37.3 (*)    MCV 72.6 (*)    MCH 23.2 (*)    All other components within normal limits  PRO BRAIN NATRIURETIC PEPTIDE  TROPONIN T, HIGH SENSITIVITY  TROPONIN T, HIGH SENSITIVITY    EKG: EKG Interpretation Date/Time:  Friday December 10 2023 17:29:48 EDT Ventricular Rate:  68 PR Interval:  189 QRS Duration:  86 QT Interval:  364 QTC Calculation: 388 R Axis:   -16  Text Interpretation: Sinus rhythm Abnormal R-wave progression, early transition Left ventricular hypertrophy No significant change since last tracing Confirmed by Dreama Longs ([id number]) on 12/10/2023 6:53:34 PM  Radiology: [facility] Chest 2 View Result Date: 12/10/2023 CLINICAL DATA:  Chest pain EXAM: CHEST - 2 VIEW COMPARISON:  Chest radiograph Sep 24, 2023 FINDINGS: The heart size and mediastinal contours are within normal limits. Both lungs are clear. The visualized skeletal structures are unremarkable. IMPRESSION: No active cardiopulmonary disease. Electronically Signed   By: Megan  Zare M.D.   On: 12/10/2023 17:56     Procedures   Medications Ordered in the ED - No data to display                                   38yo male with history of nonischemic cardiomyopathy 25% EF 2020, rebounded ECHO 2021, past DM on Mounjaro , hyperlipidemia, hypertension, GAD, presents with concern for chest pain.  Differential diagnosis for chest pain includes pulmonary embolus, dissection, pneumothorax, pneumonia, ACS, myocarditis, pericarditis, CHF  exacerbation, GERD.   EKG evaluated by me and shows borderline ST elevation similar to prior, no clinically significant changes.  Chest x-ray completed personally by and interpreted by me shows no evidence of pneumonia, pneumothorax, pulmonary edema, or cardiomegaly.  Labs completed and personally about interpreted by me show a creatinine of 1.54, similar to prior values of 1.48, 1.77.  CBC with no leukocytosis, similar mild anemia with a hemoglobin of 11.9.  Troponin is undetectable x2.  proBNP is normal.  Have low clinical suspicion for aortic dissection, PE by history and exam.  Recommend Cardiology follow up, discussed return precautions. Patient discharged in stable condition with understanding of reasons to return.      Final diagnoses:  Nonspecific chest pain    ED Discharge Orders          Ordered    Ambulatory referral to Cardiology        12/10/23 2013               Dreama Longs, MD 12/11/23 1313

## 2023-12-17 ENCOUNTER — Encounter (HOSPITAL_BASED_OUTPATIENT_CLINIC_OR_DEPARTMENT_OTHER): Payer: Self-pay | Admitting: Emergency Medicine

## 2023-12-17 ENCOUNTER — Other Ambulatory Visit: Payer: Self-pay

## 2023-12-17 ENCOUNTER — Emergency Department (HOSPITAL_BASED_OUTPATIENT_CLINIC_OR_DEPARTMENT_OTHER)
Admission: EM | Admit: 2023-12-17 | Discharge: 2023-12-17 | Disposition: A | Discharge status: Home / Self Care | Attending: Emergency Medicine | Admitting: Emergency Medicine

## 2023-12-17 ENCOUNTER — Emergency Department (HOSPITAL_BASED_OUTPATIENT_CLINIC_OR_DEPARTMENT_OTHER)

## 2023-12-17 DIAGNOSIS — Z79899 Other long term (current) drug therapy: Secondary | ICD-10-CM | POA: Diagnosis not present

## 2023-12-17 DIAGNOSIS — N189 Chronic kidney disease, unspecified: Secondary | ICD-10-CM | POA: Diagnosis not present

## 2023-12-17 DIAGNOSIS — R1013 Epigastric pain: Secondary | ICD-10-CM | POA: Diagnosis not present

## 2023-12-17 DIAGNOSIS — E119 Type 2 diabetes mellitus without complications: Secondary | ICD-10-CM | POA: Diagnosis not present

## 2023-12-17 DIAGNOSIS — R0789 Other chest pain: Secondary | ICD-10-CM | POA: Diagnosis not present

## 2023-12-17 DIAGNOSIS — I130 Hypertensive heart and chronic kidney disease with heart failure and stage 1 through stage 4 chronic kidney disease, or unspecified chronic kidney disease: Secondary | ICD-10-CM | POA: Insufficient documentation

## 2023-12-17 DIAGNOSIS — R079 Chest pain, unspecified: Secondary | ICD-10-CM | POA: Diagnosis present

## 2023-12-17 DIAGNOSIS — I509 Heart failure, unspecified: Secondary | ICD-10-CM | POA: Diagnosis not present

## 2023-12-17 LAB — COMPREHENSIVE METABOLIC PANEL WITH GFR
ALT: 12 U/L (ref 0–44)
AST: 17 U/L (ref 15–41)
Albumin: 3.9 g/dL (ref 3.5–5.0)
Alkaline Phosphatase: 90 U/L (ref 38–126)
Anion gap: 11 (ref 5–15)
BUN: 15 mg/dL (ref 6–20)
CO2: 23 mmol/L (ref 22–32)
Calcium: 8.7 mg/dL — ABNORMAL LOW (ref 8.9–10.3)
Chloride: 107 mmol/L (ref 98–111)
Creatinine, Ser: 1.64 mg/dL — ABNORMAL HIGH (ref 0.61–1.24)
GFR, Estimated: 54 mL/min — ABNORMAL LOW (ref >60)
Glucose, Bld: 113 mg/dL — ABNORMAL HIGH (ref 70–99)
Potassium: 3.7 mmol/L (ref 3.5–5.1)
Sodium: 140 mmol/L (ref 135–145)
Total Bilirubin: 0.2 mg/dL (ref 0.0–1.2)
Total Protein: 7.3 g/dL (ref 6.5–8.1)

## 2023-12-17 LAB — CBC WITH DIFFERENTIAL/PLATELET
Abs Immature Granulocytes: 0.03 K/uL (ref 0.00–0.07)
Basophils Absolute: 0 K/uL (ref 0.0–0.1)
Basophils Relative: 0 %
Eosinophils Absolute: 0.4 K/uL (ref 0.0–0.5)
Eosinophils Relative: 4 %
HCT: 36.6 % — ABNORMAL LOW (ref 39.0–52.0)
Hemoglobin: 11.8 g/dL — ABNORMAL LOW (ref 13.0–17.0)
Immature Granulocytes: 0 %
Lymphocytes Relative: 50 %
Lymphs Abs: 4.7 K/uL — ABNORMAL HIGH (ref 0.7–4.0)
MCH: 23.5 pg — ABNORMAL LOW (ref 26.0–34.0)
MCHC: 32.2 g/dL (ref 30.0–36.0)
MCV: 72.8 fL — ABNORMAL LOW (ref 80.0–100.0)
Monocytes Absolute: 0.8 K/uL (ref 0.1–1.0)
Monocytes Relative: 8 %
Neutro Abs: 3.7 K/uL (ref 1.7–7.7)
Neutrophils Relative %: 38 %
Platelets: 267 K/uL (ref 150–400)
RBC: 5.03 MIL/uL (ref 4.22–5.81)
RDW: 15.1 % (ref 11.5–15.5)
Smear Review: NORMAL
WBC: 9.5 K/uL (ref 4.0–10.5)
nRBC: 0 % (ref 0.0–0.2)

## 2023-12-17 LAB — TROPONIN T, HIGH SENSITIVITY
Troponin T High Sensitivity: <15 ng/L (ref <19)
Troponin T High Sensitivity: <15 ng/L (ref <19)

## 2023-12-17 LAB — LIPASE, BLOOD: Lipase: 39 U/L (ref 11–51)

## 2023-12-17 NOTE — ED Triage Notes (Signed)
 Chest and abdominal pain off and on X 1 week. this time X 15 min with tightness.

## 2023-12-17 NOTE — ED Provider Notes (Signed)
 [place] [facility]  Provider Note  CSN: [id number] Arrival date & time: 12/17/23 0006  History Chief Complaint  Patient presents with   Chest Pain   Abdominal Pain    Derek Reed is a 38 y.o. male with history of nonischemic CHF, DM, CKD, HTN, GAD reports onset of chest tightness about prior to arrival with some RUQ abdominal discomfort as well. Seen for similar about a week ago with reassuring workup. Symptoms have improved since onset.    Home Medications Prior to Admission medications   Medication Sig Start Date End Date Taking? Authorizing Provider  amoxicillin  (AMOXIL ) 500 MG capsule Take 2 capsules (1,000 mg total) by mouth 2 (two) times daily. 10/06/23   Raford Lenis, MD  atorvastatin  (LIPITOR) 10 MG tablet Take 0.5 tablets (5 mg total) by mouth daily. 06/17/23   Danton Jon HERO, PA-C  carvedilol  (COREG ) 6.25 MG tablet Take 1 tablet (6.25 mg total) by mouth 2 (two) times daily with a meal. 09/14/23   Delbert Clam, MD  hydrALAZINE  (APRESOLINE ) 50 MG tablet Take 1 tablet (50 mg total) by mouth 2 (two) times daily. 10/22/23   Darryle Thom CROME, PA-C  Iron , Ferrous Sulfate , 325 (65 Fe) MG TABS Take 1 tablet by mouth every other day. 10/27/23   Newlin, Enobong, MD  potassium chloride  SA (KLOR-CON  M20) 20 MEQ tablet TAKE 1 TABLET BY MOUTH 2 TIMES A DAY 10/26/23   Newlin, Enobong, MD  sacubitril -valsartan  (ENTRESTO ) 97-103 MG Take 1 tablet by mouth 2 (two) times daily. 09/14/23   Newlin, Enobong, MD  sildenafil  (VIAGRA ) 50 MG tablet Take 1 tablet (50 mg total) by mouth daily as needed for erectile dysfunction. 07/07/23   Croitoru, Mihai, MD  spironolactone  (ALDACTONE ) 25 MG tablet Take 1 tablet (25 mg total) by mouth daily. 09/14/23   Newlin, Enobong, MD  tirzepatide  (MOUNJARO ) 2.5 MG/0.5ML Pen Inject 2.5 mg into the skin once a week. 12/02/23   Newlin, Enobong, MD     Allergies    Egg-derived products   Review of Systems   Review of  Systems Please see HPI for pertinent positives and negatives  Physical Exam BP 112/75   Pulse 71   Temp (!) 97.5 F (36.4 C) (Oral)   Resp 19   Ht 5' 6 (1.676 m)   Wt 112.5 kg   SpO2 100%   BMI 40.03 kg/m   Physical Exam Vitals and nursing note reviewed.  Constitutional:      Appearance: Normal appearance.  HENT:     Head: Normocephalic and atraumatic.     Nose: Nose normal.     Mouth/Throat:     Mouth: Mucous membranes are moist.  Eyes:     Extraocular Movements: Extraocular movements intact.     Conjunctiva/sclera: Conjunctivae normal.  Cardiovascular:     Rate and Rhythm: Normal rate.  Pulmonary:     Effort: Pulmonary effort is normal.     Breath sounds: Normal breath sounds.  Abdominal:     General: Abdomen is flat. There is no abdominal bruit.     Palpations: Abdomen is soft. There is no hepatomegaly.     Tenderness: There is no abdominal tenderness. There is no guarding.  Musculoskeletal:        General: No swelling. Normal range of motion.     Cervical back: Neck supple.  Skin:    General: Skin is warm and dry.  Neurological:     General: No focal deficit present.  Mental Status: He is alert.  Psychiatric:        Mood and Affect: Mood normal.     ED Results / Procedures / Treatments   EKG None  Procedures Procedures  Medications Ordered in the ED Medications - No data to display  Initial Impression and Plan  Patient here with of nonspecific chest and abdominal pain. Exam and vitals are reassuring. Will check labs, CXR and monitor in the ED.   ED Course   Clinical Course as of 12/17/23 0328  Fri Dec 17, 2023  0052 CMP with CKD at baseline. CBC is unremarkable. Lipase and Trop are normal.  [CS]  0204 I personally viewed the images from radiology studies and agree with radiologist interpretation: CXR is clear.  [CS]  0324 Delta trop remains normal. Low risk for ACS or MACE. Plan discharge with outpatient cardiology follow up.  [CS]     Clinical Course User Index [CS] Roselyn Carlin NOVAK, MD     MDM Rules/Calculators/A&P Medical Decision Making Given presenting complaint, I considered that admission might be necessary. After review of results from ED lab and/or imaging studies, admission to the hospital is not indicated at this time.    Problems Addressed: Atypical chest pain: acute illness or injury Epigastric pain: acute illness or injury  Amount and/or Complexity of Data Reviewed Labs: ordered. Decision-making details documented in ED Course. Radiology: ordered and independent interpretation performed. Decision-making details documented in ED Course. ECG/medicine tests: ordered and independent interpretation performed. Decision-making details documented in ED Course.  Risk Decision regarding hospitalization.     Final Clinical Impression(s) / ED Diagnoses Final diagnoses:  Atypical chest pain  Epigastric pain    Rx / DC Orders ED Discharge Orders     None        Roselyn Carlin NOVAK, MD 12/17/23 [phone]

## 2023-12-18 ENCOUNTER — Other Ambulatory Visit: Payer: Self-pay | Admitting: Cardiovascular Disease

## 2023-12-18 ENCOUNTER — Other Ambulatory Visit: Payer: Self-pay | Admitting: Family Medicine

## 2023-12-18 ENCOUNTER — Other Ambulatory Visit: Payer: Self-pay | Admitting: Cardiology

## 2023-12-18 DIAGNOSIS — I110 Hypertensive heart disease with heart failure: Secondary | ICD-10-CM

## 2023-12-21 ENCOUNTER — Ambulatory Visit: Admitting: Family Medicine

## 2023-12-22 ENCOUNTER — Other Ambulatory Visit: Payer: Self-pay

## 2023-12-22 ENCOUNTER — Other Ambulatory Visit (HOSPITAL_COMMUNITY): Payer: Self-pay

## 2023-12-22 ENCOUNTER — Encounter: Payer: Self-pay | Admitting: Family Medicine

## 2023-12-22 DIAGNOSIS — E876 Hypokalemia: Secondary | ICD-10-CM

## 2023-12-22 DIAGNOSIS — I10 Essential (primary) hypertension: Secondary | ICD-10-CM

## 2023-12-22 MED ORDER — POTASSIUM CHLORIDE CRYS ER 20 MEQ PO TBCR: 20.0000 meq | EXTENDED_RELEASE_TABLET | Freq: Two times a day (BID) | ORAL | 2 refills | Status: DC

## 2023-12-22 MED ORDER — TIRZEPATIDE 2.5 MG/0.5ML ~~LOC~~ SOAJ: 2.5000 mg | SUBCUTANEOUS | 1 refills | Status: DC

## 2023-12-23 ENCOUNTER — Other Ambulatory Visit: Payer: Self-pay

## 2023-12-23 DIAGNOSIS — E782 Mixed hyperlipidemia: Secondary | ICD-10-CM

## 2023-12-23 MED ORDER — ATORVASTATIN CALCIUM 10 MG PO TABS: 5.0000 mg | ORAL_TABLET | Freq: Every day | ORAL | 1 refills | Status: DC

## 2023-12-24 ENCOUNTER — Ambulatory Visit: Payer: Self-pay | Admitting: Gastroenterology

## 2023-12-27 ENCOUNTER — Other Ambulatory Visit: Payer: Self-pay | Admitting: Family Medicine

## 2023-12-27 ENCOUNTER — Encounter: Payer: Self-pay | Admitting: Family Medicine

## 2023-12-27 MED ORDER — TIRZEPATIDE 2.5 MG/0.5ML ~~LOC~~ SOAJ: 2.5000 mg | SUBCUTANEOUS | 1 refills | Status: DC

## 2024-01-11 ENCOUNTER — Other Ambulatory Visit: Payer: Self-pay

## 2024-01-11 DIAGNOSIS — E782 Mixed hyperlipidemia: Secondary | ICD-10-CM

## 2024-02-02 ENCOUNTER — Other Ambulatory Visit: Payer: Self-pay | Admitting: Family Medicine

## 2024-02-02 DIAGNOSIS — I110 Hypertensive heart disease with heart failure: Secondary | ICD-10-CM

## 2024-02-02 DIAGNOSIS — I10 Essential (primary) hypertension: Secondary | ICD-10-CM

## 2024-02-04 ENCOUNTER — Encounter: Payer: Self-pay | Admitting: Family Medicine

## 2024-02-08 ENCOUNTER — Other Ambulatory Visit: Payer: Self-pay

## 2024-02-08 ENCOUNTER — Other Ambulatory Visit (HOSPITAL_COMMUNITY): Payer: Self-pay

## 2024-02-08 ENCOUNTER — Other Ambulatory Visit: Payer: Self-pay | Admitting: Pharmacist

## 2024-02-08 ENCOUNTER — Encounter: Payer: Self-pay | Admitting: Family Medicine

## 2024-02-08 DIAGNOSIS — D509 Iron deficiency anemia, unspecified: Secondary | ICD-10-CM

## 2024-02-08 DIAGNOSIS — E876 Hypokalemia: Secondary | ICD-10-CM

## 2024-02-08 DIAGNOSIS — I110 Hypertensive heart disease with heart failure: Secondary | ICD-10-CM

## 2024-02-08 DIAGNOSIS — I10 Essential (primary) hypertension: Secondary | ICD-10-CM

## 2024-02-08 DIAGNOSIS — E782 Mixed hyperlipidemia: Secondary | ICD-10-CM

## 2024-02-08 MED ORDER — HYDRALAZINE HCL 50 MG PO TABS
50.0000 mg | ORAL_TABLET | Freq: Two times a day (BID) | ORAL | 0 refills | Status: DC
  Filled 2024-02-08: qty 60, 30d supply, fill #0

## 2024-02-08 MED ORDER — TIRZEPATIDE 2.5 MG/0.5ML ~~LOC~~ SOAJ
2.5000 mg | SUBCUTANEOUS | 0 refills | Status: DC
  Filled 2024-02-08: qty 2, 28d supply, fill #0

## 2024-02-08 MED ORDER — CARVEDILOL 6.25 MG PO TABS
6.2500 mg | ORAL_TABLET | Freq: Two times a day (BID) | ORAL | 0 refills | Status: DC
  Filled 2024-02-08: qty 60, 30d supply, fill #0

## 2024-02-08 MED ORDER — POTASSIUM CHLORIDE CRYS ER 20 MEQ PO TBCR
20.0000 meq | EXTENDED_RELEASE_TABLET | Freq: Two times a day (BID) | ORAL | 0 refills | Status: DC
  Filled 2024-02-08: qty 60, 30d supply, fill #0

## 2024-02-08 MED ORDER — SACUBITRIL-VALSARTAN 97-103 MG PO TABS
1.0000 | ORAL_TABLET | Freq: Two times a day (BID) | ORAL | 0 refills | Status: DC
  Filled 2024-02-08 – 2024-03-04 (×3): qty 60, 30d supply, fill #0

## 2024-02-08 MED ORDER — SPIRONOLACTONE 25 MG PO TABS
25.0000 mg | ORAL_TABLET | Freq: Every day | ORAL | 0 refills | Status: DC
  Filled 2024-02-08: qty 30, 30d supply, fill #0

## 2024-02-08 MED ORDER — ATORVASTATIN CALCIUM 10 MG PO TABS
5.0000 mg | ORAL_TABLET | Freq: Every day | ORAL | 0 refills | Status: DC
  Filled 2024-02-08: qty 15, 30d supply, fill #0

## 2024-02-08 MED ORDER — IRON (FERROUS SULFATE) 325 (65 FE) MG PO TABS
325.0000 mg | ORAL_TABLET | ORAL | 0 refills | Status: DC
  Filled 2024-02-08: qty 15, 30d supply, fill #0

## 2024-03-04 ENCOUNTER — Other Ambulatory Visit (HOSPITAL_COMMUNITY): Payer: Self-pay

## 2024-03-04 ENCOUNTER — Encounter: Payer: Self-pay | Admitting: Family Medicine

## 2024-03-07 ENCOUNTER — Other Ambulatory Visit: Payer: Self-pay | Admitting: Family Medicine

## 2024-03-07 ENCOUNTER — Telehealth: Payer: Self-pay | Admitting: Family Medicine

## 2024-03-07 ENCOUNTER — Ambulatory Visit: Admitting: Family Medicine

## 2024-03-07 DIAGNOSIS — I110 Hypertensive heart disease with heart failure: Secondary | ICD-10-CM

## 2024-03-07 NOTE — Telephone Encounter (Signed)
 Is there any way for him to be seen sooner? Dr. Newlin doesn't have any openings before November 5th.

## 2024-03-07 NOTE — Telephone Encounter (Signed)
 Copied from CRM #[id number]. Topic: Appointments - Scheduling Inquiry for Clinic >> Mar 07, 2024  9:07 AM Everette C wrote:  Reason for CRM: The patient shares that they would like to discuss their options for an appointment for paperwork completion. The paperwork is due by 03/15/24 and the patient would like to be seen by their PCP (who had no appointments for November available at the time of call) Please contact further when available to discuss scheduling and options

## 2024-03-08 ENCOUNTER — Other Ambulatory Visit: Payer: Self-pay | Admitting: Family Medicine

## 2024-03-08 DIAGNOSIS — D509 Iron deficiency anemia, unspecified: Secondary | ICD-10-CM

## 2024-03-09 ENCOUNTER — Other Ambulatory Visit (HOSPITAL_COMMUNITY): Payer: Self-pay

## 2024-03-09 ENCOUNTER — Ambulatory Visit: Admitting: Family Medicine

## 2024-03-09 ENCOUNTER — Encounter: Payer: Self-pay | Admitting: Family Medicine

## 2024-03-09 ENCOUNTER — Other Ambulatory Visit: Payer: Self-pay | Admitting: Family Medicine

## 2024-03-09 DIAGNOSIS — D509 Iron deficiency anemia, unspecified: Secondary | ICD-10-CM

## 2024-03-09 DIAGNOSIS — R079 Chest pain, unspecified: Secondary | ICD-10-CM | POA: Diagnosis not present

## 2024-03-09 MED ORDER — HYDRALAZINE HCL 50 MG PO TABS
50.0000 mg | ORAL_TABLET | Freq: Two times a day (BID) | ORAL | 1 refills | Status: AC
  Filled 2024-03-09: qty 60, 30d supply, fill #0
  Filled 2024-05-25: qty 60, 30d supply, fill #1
  Filled 2024-06-12: qty 60, 30d supply, fill #2

## 2024-03-09 MED ORDER — IRON (FERROUS SULFATE) 325 (65 FE) MG PO TABS
325.0000 mg | ORAL_TABLET | ORAL | 1 refills | Status: AC
  Filled 2024-03-09: qty 15, 30d supply, fill #0
  Filled 2024-04-19: qty 15, 30d supply, fill #1
  Filled 2024-05-25: qty 15, 30d supply, fill #2
  Filled 2024-06-12: qty 15, 30d supply, fill #3

## 2024-03-09 NOTE — Progress Notes (Signed)
 Virtual Visit via Video Note  I connected with Derek Reed, on 03/09/2024 at 1:41 PM by video enabled telemedicine device and verified that I am speaking with the correct person using two identifiers.   Consent: I discussed the limitations, risks, security and privacy concerns of performing an evaluation and management service by telemedicine and the availability of in person appointments. I also discussed with the patient that there may be a patient responsible charge related to this service. The patient expressed understanding and agreed to proceed.   Location of Patient: Home  Location of Provider: Clinic   Persons participating in Telemedicine visit: Camrin Koci Dr. Delbert    Discussed the use of AI scribe software for clinical note transcription with the patient, who gave verbal consent to proceed.  History of Present Illness Derek Reed is a 38 year old male who presents for completion of FMLA paperwork related to chest pain.  He experienced chest pain leading to emergency department visits on August 1st and August 8th, 2025. He was not admitted and was advised to follow up with a cardiologist. At the time, he was working two jobs, which he believes contributed to his symptoms. He has since stopped one job and is seeking to return to work. He has a cardiologist but has not yet attended any appointments. He was out of work from August 10th to August 22nd, 2025, and is now seeking to have his FMLA paperwork completed to document this period of leave.      Past Medical History:  Diagnosis Date   Acute CHF (congestive heart failure) (HCC) 02/24/2019   Anxiety    CKD (chronic kidney disease) stage 2, GFR 60-89 ml/min    Diabetes mellitus without complication (HCC)    Based on A1C. Pt diet controlled.   Essential hypertension 02/24/2019   GAD (generalized anxiety disorder) 03/26/2014   Hyperlipidemia    Hypertension    Obesity    Peritonsillar abscess     Allergies  Allergen Reactions   Egg Protein-Containing Drug Products Nausea And Vomiting    He just vomits eggs if he eats them but he eats many products that contain eggs without any problem 06/17/2023    Current Outpatient Medications on File Prior to Visit  Medication Sig Dispense Refill   amoxicillin  (AMOXIL ) 500 MG capsule Take 2 capsules (1,000 mg total) by mouth 2 (two) times daily. 40 capsule 0   atorvastatin  (LIPITOR) 10 MG tablet Take 0.5 tablets (5 mg total) by mouth daily. 15 tablet 0   carvedilol  (COREG ) 6.25 MG tablet TAKE 1 TABLET BY MOUTH TWICE  DAILY WITH MEALS 180 tablet 1   hydrALAZINE  (APRESOLINE ) 50 MG tablet Take 1 tablet (50 mg total) by mouth 2 (two) times daily. 60 tablet 0   Iron , Ferrous Sulfate , 325 (65 Fe) MG TABS Take 1 tablet by mouth every other day. 15 tablet 0   potassium chloride  SA (KLOR-CON  M20) 20 MEQ tablet Take 1 tablet (20 mEq total) by mouth 2 (two) times daily. 60 tablet 0   sacubitril -valsartan  (ENTRESTO ) 97-103 MG Take 1 tablet by mouth 2 (two) times daily. 60 tablet 0   sildenafil  (VIAGRA ) 50 MG tablet Take 1 tablet (50 mg total) by mouth daily as needed for erectile dysfunction. 30 tablet 5   spironolactone  (ALDACTONE ) 25 MG tablet Take 1 tablet (25 mg total) by mouth daily. 30 tablet 0   tirzepatide  (MOUNJARO ) 2.5 MG/0.5ML Pen Inject 2.5 mg into the skin once a week. 2 mL 0  No current facility-administered medications on file prior to visit.    ROS: See HPI  Observations/Objective: Awake, alert, oriented x3 Not in acute distress Normal mood      Latest Ref Rng & Units 12/17/2023   12:16 AM 12/10/2023    5:32 PM 11/06/2023   11:33 PM  CMP  Glucose 70 - 99 mg/dL 886  866  884   BUN 6 - 20 mg/dL 15  11  11    Creatinine 0.61 - 1.24 mg/dL 8.35  8.45  8.51   Sodium 135 - 145 mmol/L 140  140  139   Potassium 3.5 - 5.1 mmol/L 3.7  3.8  3.5   Chloride 98 - 111 mmol/L 107  108  106   CO2 22 - 32 mmol/L 23  22  23    Calcium  8.9 - 10.3  mg/dL 8.7  8.9  8.5   Total Protein 6.5 - 8.1 g/dL 7.3   7.0   Total Bilirubin 0.0 - 1.2 mg/dL 0.2   <9.7   Alkaline Phos 38 - 126 U/L 90   86   AST 15 - 41 U/L 17   15   ALT 0 - 44 U/L 12   11     Lipid Panel     Component Value Date/Time   CHOL 93 (L) 03/10/2023 1004   TRIG 118 03/10/2023 1004   HDL 26 (L) 03/10/2023 1004   CHOLHDL 3.6 03/10/2023 1004   CHOLHDL 5.9 02/24/2019 0338   VLDL 13 02/24/2019 0338   LDLCALC 45 03/10/2023 1004   LABVLDL 22 03/10/2023 1004    Lab Results  Component Value Date   HGBA1C 6.0 09/14/2023     Assessment and plan:   Assessment & Plan Chest pain Chest pain in August 2025.  Symptoms have since resolved - Complete FMLA paperwork for leave from December 19, 2023, to December 31, 2023. - Schedule cardiologist follow-up  - Schedule primary care follow-up for labs and assessments.     No orders of the defined types were placed in this encounter.   Follow Up Instructions: Schedule appointment for follow-up of chronic medical conditions.   I discussed the assessment and treatment plan with the patient. The patient was provided an opportunity to ask questions and all were answered. The patient agreed with the plan and demonstrated an understanding of the instructions.   The patient was advised to call back or seek an in-person evaluation if the symptoms worsen or if the condition fails to improve as anticipated.     I provided 13 minutes total of Telehealth time during this encounter including median intraservice time, reviewing previous notes, investigations, ordering medications, medical decision making, coordinating care and patient verbalized understanding at the end of the visit.     Corrina Sabin, MD, FAAFP. [facility] [place], [place] [phone]   03/09/2024, 1:41 PM

## 2024-03-10 NOTE — Telephone Encounter (Signed)
 Form has been placed in box outside office door.

## 2024-03-11 ENCOUNTER — Other Ambulatory Visit (HOSPITAL_COMMUNITY): Payer: Self-pay

## 2024-03-13 ENCOUNTER — Encounter: Payer: Self-pay | Admitting: Family Medicine

## 2024-03-15 ENCOUNTER — Encounter: Payer: Self-pay | Admitting: Family Medicine

## 2024-03-21 ENCOUNTER — Other Ambulatory Visit: Payer: Self-pay

## 2024-03-21 ENCOUNTER — Encounter: Payer: Self-pay | Admitting: Family Medicine

## 2024-03-21 DIAGNOSIS — E876 Hypokalemia: Secondary | ICD-10-CM

## 2024-03-21 DIAGNOSIS — I10 Essential (primary) hypertension: Secondary | ICD-10-CM

## 2024-03-21 MED ORDER — POTASSIUM CHLORIDE CRYS ER 20 MEQ PO TBCR: 20.0000 meq | EXTENDED_RELEASE_TABLET | Freq: Two times a day (BID) | ORAL | 0 refills | Status: DC

## 2024-04-12 ENCOUNTER — Encounter: Payer: Self-pay | Admitting: Family Medicine

## 2024-04-12 ENCOUNTER — Ambulatory Visit: Attending: Family Medicine | Admitting: Family Medicine

## 2024-04-12 VITALS — BP 126/84 | HR 79 | Temp 98.4°F | Ht 66.0 in | Wt 249.4 lb

## 2024-04-12 DIAGNOSIS — Z23 Encounter for immunization: Secondary | ICD-10-CM

## 2024-04-12 DIAGNOSIS — E1169 Type 2 diabetes mellitus with other specified complication: Secondary | ICD-10-CM

## 2024-04-12 DIAGNOSIS — I130 Hypertensive heart and chronic kidney disease with heart failure and stage 1 through stage 4 chronic kidney disease, or unspecified chronic kidney disease: Secondary | ICD-10-CM

## 2024-04-12 DIAGNOSIS — E1159 Type 2 diabetes mellitus with other circulatory complications: Secondary | ICD-10-CM

## 2024-04-12 DIAGNOSIS — Z7985 Long-term (current) use of injectable non-insulin antidiabetic drugs: Secondary | ICD-10-CM

## 2024-04-12 DIAGNOSIS — E876 Hypokalemia: Secondary | ICD-10-CM

## 2024-04-12 DIAGNOSIS — F419 Anxiety disorder, unspecified: Secondary | ICD-10-CM

## 2024-04-12 LAB — POCT GLYCOSYLATED HEMOGLOBIN (HGB A1C): HbA1c, POC (controlled diabetic range): 6.2 % (ref 0.0–7.0)

## 2024-04-12 MED ORDER — FLUOXETINE HCL 20 MG PO CAPS: 20.0000 mg | ORAL_CAPSULE | Freq: Every day | ORAL | 1 refills | Status: AC

## 2024-04-12 MED ORDER — TIRZEPATIDE 2.5 MG/0.5ML ~~LOC~~ SOAJ: 2.5000 mg | SUBCUTANEOUS | 1 refills | Status: AC

## 2024-04-12 MED ORDER — SPIRONOLACTONE 25 MG PO TABS: 25.0000 mg | ORAL_TABLET | Freq: Every day | ORAL | 1 refills | Status: AC

## 2024-04-12 MED ORDER — SACUBITRIL-VALSARTAN 97-103 MG PO TABS: 1.0000 | ORAL_TABLET | Freq: Two times a day (BID) | ORAL | 1 refills | Status: AC

## 2024-04-12 MED ORDER — ATORVASTATIN CALCIUM 10 MG PO TABS: 10.0000 mg | ORAL_TABLET | Freq: Every day | ORAL | 1 refills | Status: AC

## 2024-04-12 MED ORDER — POTASSIUM CHLORIDE CRYS ER 20 MEQ PO TBCR: 20.0000 meq | EXTENDED_RELEASE_TABLET | Freq: Two times a day (BID) | ORAL | 1 refills | Status: DC

## 2024-04-12 NOTE — Progress Notes (Signed)
 Subjective:  Patient ID: Derek Reed, male    DOB: 05-Sep-1985  Age: 38 y.o. MRN: [id number]  CC: Medical Management of Chronic Issues     Discussed the use of AI scribe software for clinical note transcription with the patient, who gave verbal consent to proceed.  History of Present Illness Derek Reed is a 38 year old male with  type 2 diabetes, congestive heart failure (EF 55 to 60% from echo 02/2021), hypretension  who presents with increased anxiety symptoms.  He has had worsening anxiety for the past few weeks related to work stress and severe sleep deprivation. He works two jobs back-to-back and sleeps only 2 to 2.5 hours per day. He feels nervous, worries excessively, and fears something awful might happen, with a racing heart but no panic attacks. He has felt worse since returning to his current work schedule, but felt better when he was out of work.  He has heart failure and has not seen cardiology in a long time. He denies shortness of breath or leg swelling. He takes Entresto , spironolactone , and carvedilol , with refills available.  His diabetes is well controlled with a recent A1c of 6.2. He takes Mounjaro  2.5 mg without side effects and wants to stay on the same dose.  He does have a  slightly abnormal kidney function on August labs with an elevated creatinine of 1.64.   He takes atorvastatin  10mg  but has been cutting the tablets in half for unclear reasons. He also takes potassium supplements and prefers larger-quantity refills for convenience.    Past Medical History:  Diagnosis Date   Acute CHF (congestive heart failure) (HCC) 02/24/2019   Anxiety    CKD (chronic kidney disease) stage 2, GFR 60-89 ml/min    Diabetes mellitus without complication (HCC)    Based on A1C. Pt diet controlled.   Essential hypertension 02/24/2019   GAD (generalized anxiety disorder) 03/26/2014   Hyperlipidemia    Hypertension    Obesity    Peritonsillar abscess     Past  Surgical History:  Procedure Laterality Date   CHOLECYSTECTOMY     CHOLECYSTECTOMY N/A 01/15/2023   Procedure: LAPAROSCOPIC CHOLECYSTECTOMY;  Surgeon: Sebastian Moles, MD;  Location: [facility] OR;  Service: General;  Laterality: N/A;   INCISION AND DRAINAGE OF PERITONSILLAR ABCESS     RIGHT/LEFT HEART CATH AND CORONARY ANGIOGRAPHY N/A 02/24/2019   Procedure: RIGHT/LEFT HEART CATH AND CORONARY ANGIOGRAPHY;  Surgeon: Rolan Ezra RAMAN, MD;  Location: [facility] INVASIVE CV LAB;  Service: Cardiovascular;  Laterality: N/A;    Family History  Problem Relation Age of Onset   Hyperlipidemia Mother    Diabetes Mother    Stroke Mother    Hypertension Mother    Hypertension Father     Social History   Socioeconomic History   Marital status: Married    Spouse name: Not on file   Number of children: Not on file   Years of education: Not on file   Highest education level: 12th grade  Occupational History   Not on file  Tobacco Use   Smoking status: Former   Smokeless tobacco: Never  Vaping Use   Vaping status: Never Used  Substance and Sexual Activity   Alcohol use: No   Drug use: No   Sexual activity: Not Currently  Other Topics Concern   Not on file  Social History Narrative   Not on file   Social Drivers of Health   Financial Resource Strain: Low Risk  (03/08/2024)   Overall Financial  Resource Strain (CARDIA)    Difficulty of Paying Living Expenses: Not very hard  Food Insecurity: No Food Insecurity (03/08/2024)   Hunger Vital Sign    Worried About Running Out of Food in the Last Year: Never true    Ran Out of Food in the Last Year: Never true  Transportation Needs: No Transportation Needs (03/08/2024)   PRAPARE - Administrator, Civil Service (Medical): No    Lack of Transportation (Non-Medical): No  Physical Activity: Sufficiently Active (03/08/2024)   Exercise Vital Sign    Days of Exercise per Week: 6 days    Minutes of Exercise per Session: 30 min  Stress: Stress  Concern Present (03/08/2024)   [employer] of Occupational Health - Occupational Stress Questionnaire    Feeling of Stress: To some extent  Social Connections: Unknown (03/08/2024)   Social Connection and Isolation Panel    Frequency of Communication with Friends and Family: Patient declined    Frequency of Social Gatherings with Friends and Family: Patient declined    Attends Religious Services: Patient declined    Database Administrator or Organizations: No    Attends Engineer, Structural: Not on file    Marital Status: Living with partner    Allergies  Allergen Reactions   Egg Protein-Containing Drug Products Nausea And Vomiting    He just vomits eggs if he eats them but he eats many products that contain eggs without any problem 06/17/2023    Outpatient Medications Prior to Visit  Medication Sig Dispense Refill   carvedilol  (COREG ) 6.25 MG tablet TAKE 1 TABLET BY MOUTH TWICE  DAILY WITH MEALS 180 tablet 1   hydrALAZINE  (APRESOLINE ) 50 MG tablet Take 1 tablet (50 mg total) by mouth 2 (two) times daily. 180 tablet 1   Iron , Ferrous Sulfate , 325 (65 Fe) MG TABS Take 1 tablet by mouth every other day. 45 tablet 1   sildenafil  (VIAGRA ) 50 MG tablet Take 1 tablet (50 mg total) by mouth daily as needed for erectile dysfunction. 30 tablet 5   atorvastatin  (LIPITOR) 10 MG tablet Take 0.5 tablets (5 mg total) by mouth daily. 15 tablet 0   potassium chloride  SA (KLOR-CON  M20) 20 MEQ tablet Take 1 tablet (20 mEq total) by mouth 2 (two) times daily. 60 tablet 0   sacubitril -valsartan  (ENTRESTO ) 97-103 MG Take 1 tablet by mouth 2 (two) times daily. 60 tablet 0   spironolactone  (ALDACTONE ) 25 MG tablet Take 1 tablet (25 mg total) by mouth daily. 30 tablet 0   tirzepatide  (MOUNJARO ) 2.5 MG/0.5ML Pen Inject 2.5 mg into the skin once a week. 2 mL 0   amoxicillin  (AMOXIL ) 500 MG capsule Take 2 capsules (1,000 mg total) by mouth 2 (two) times daily. 40 capsule 0   No  facility-administered medications prior to visit.     ROS Review of Systems  Constitutional:  Negative for activity change and appetite change.  HENT:  Negative for sinus pressure and sore throat.   Respiratory:  Negative for chest tightness, shortness of breath and wheezing.   Cardiovascular:  Negative for chest pain and palpitations.  Gastrointestinal:  Negative for abdominal distention, abdominal pain and constipation.  Genitourinary: Negative.   Musculoskeletal: Negative.   Psychiatric/Behavioral:  Negative for behavioral problems and dysphoric mood.        Positive for anxiety    Objective:  BP 126/84   Pulse 79   Temp 98.4 F (36.9 C) (Oral)   Ht 5' 6 (1.676 m)  Wt 249 lb 6.4 oz (113.1 kg)   SpO2 98%   BMI 40.25 kg/m      04/12/2024    3:30 PM 12/17/2023    1:30 AM 12/17/2023    1:00 AM  BP/Weight  Systolic BP 126 101 112  Diastolic BP 84 74 75  Wt. (Lbs) 249.4    BMI 40.25 kg/m2        Physical Exam Constitutional:      Appearance: He is well-developed.  Cardiovascular:     Rate and Rhythm: Normal rate.     Heart sounds: Normal heart sounds. No murmur heard. Pulmonary:     Effort: Pulmonary effort is normal.     Breath sounds: Normal breath sounds. No wheezing or rales.  Chest:     Chest wall: No tenderness.  Abdominal:     General: Bowel sounds are normal. There is no distension.     Palpations: Abdomen is soft. There is no mass.     Tenderness: There is no abdominal tenderness.  Musculoskeletal:        General: Normal range of motion.     Right lower leg: No edema.     Left lower leg: No edema.  Neurological:     Mental Status: He is alert and oriented to person, place, and time.  Psychiatric:        Mood and Affect: Mood normal.        Latest Ref Rng & Units 12/17/2023   12:16 AM 12/10/2023    5:32 PM 11/06/2023   11:33 PM  CMP  Glucose 70 - 99 mg/dL 886  866  884   BUN 6 - 20 mg/dL 15  11  11    Creatinine 0.61 - 1.24 mg/dL 8.35  8.45   8.51   Sodium 135 - 145 mmol/L 140  140  139   Potassium 3.5 - 5.1 mmol/L 3.7  3.8  3.5   Chloride 98 - 111 mmol/L 107  108  106   CO2 22 - 32 mmol/L 23  22  23    Calcium  8.9 - 10.3 mg/dL 8.7  8.9  8.5   Total Protein 6.5 - 8.1 g/dL 7.3   7.0   Total Bilirubin 0.0 - 1.2 mg/dL 0.2   <9.7   Alkaline Phos 38 - 126 U/L 90   86   AST 15 - 41 U/L 17   15   ALT 0 - 44 U/L 12   11     Lipid Panel     Component Value Date/Time   CHOL 93 (L) 03/10/2023 1004   TRIG 118 03/10/2023 1004   HDL 26 (L) 03/10/2023 1004   CHOLHDL 3.6 03/10/2023 1004   CHOLHDL 5.9 02/24/2019 0338   VLDL 13 02/24/2019 0338   LDLCALC 45 03/10/2023 1004    CBC    Component Value Date/Time   WBC 9.5 12/17/2023 0016   RBC 5.03 12/17/2023 0016   HGB 11.8 (L) 12/17/2023 0016   HGB 11.0 (L) 09/14/2023 1054   HCT 36.6 (L) 12/17/2023 0016   HCT 34.4 (L) 09/14/2023 1054   PLT 267 12/17/2023 0016   PLT 246 09/14/2023 1054   MCV 72.8 (L) 12/17/2023 0016   MCV 73 (L) 09/14/2023 1054   MCH 23.5 (L) 12/17/2023 0016   MCHC 32.2 12/17/2023 0016   RDW 15.1 12/17/2023 0016   RDW 15.6 (H) 09/14/2023 1054   LYMPHSABS 4.7 (H) 12/17/2023 0016   LYMPHSABS 3.3 (H) 09/14/2023 1054   MONOABS 0.8  12/17/2023 0016   EOSABS 0.4 12/17/2023 0016   EOSABS 1.0 (H) 09/14/2023 1054   BASOSABS 0.0 12/17/2023 0016   BASOSABS 0.1 09/14/2023 1054    Lab Results  Component Value Date   HGBA1C 6.2 04/12/2024   Lab Results  Component Value Date   HGBA1C 6.2 04/12/2024   HGBA1C 6.0 09/14/2023   HGBA1C 6.6 06/17/2023        Assessment & Plan Anxiety disorder Anxiety exacerbated by work-related stress and lack of sleep. Prozac chosen for its mild nature, non-addictive properties, and lack of sedative effects. - Prescribed Prozac for anxiety management. - Discussed potential side effects and non-sedative nature of Prozac. - Encouraged addressing work-related stress as a root cause of anxiety.  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 - Continue current medications: Entresto , spironolactone , and carvedilol . - He has not been to see cardiology in a while  Type 2 diabetes mellitus treated with injectable of non-insulin  origin A1c is 6.2, indicating good control; goal is less than 7 - Continue Tirzepatide  2.5 mg weekly. -Counseled on Diabetic diet, the healthy plate, 849 minutes of moderate intensity exercise/week Blood sugar logs with fasting goals of 80-120 mg/dl, random of less than 819 and in the event of sugars less than 60 mg/dl or greater than 599 mg/dl encouraged to notify the clinic. Advised on the need for annual eye exams, annual foot exams, Pneumonia vaccine.   Mixed hyperlipidemia associated with type 2 diabetes mellitus Previously cutting atorvastatin  pill in half, which is not recommended for optimal benefit. - Instructed to take full atorvastatin  10 mg daily. - Low-cholesterol diet  Hypokalemia Currently taking potassium chloride  supplements. - Continue potassium chloride  supplementation. - Provided a 90-day supply of potassium chloride .   Hypertension associated with type 2 diabetes mellitus - Controlled - Continue antihypertensive -Counseled on blood pressure goal of less than 130/80, low-sodium, DASH diet, medication compliance, 150 minutes of moderate intensity exercise per week. Discussed medication compliance, adverse effects.  General Health Maintenance Encounter for administration of vaccine Due for pneumonia vaccine and flu shot. - Administered pneumonia vaccine and flu shot.       Meds ordered this encounter  Medications   atorvastatin  (LIPITOR) 10 MG tablet    Sig: Take 1 tablet (10 mg total) by mouth daily.    Dispense:  90 tablet    Refill:  1   potassium chloride  SA (KLOR-CON  M20) 20 MEQ tablet    Sig: Take 1 tablet (20 mEq total) by  mouth 2 (two) times daily.    Dispense:  90 tablet    Refill:  1   sacubitril -valsartan  (ENTRESTO ) 97-103 MG    Sig: Take 1 tablet by mouth 2 (two) times daily.    Dispense:  180 tablet    Refill:  1   spironolactone  (ALDACTONE ) 25 MG tablet    Sig: Take 1 tablet (25 mg total) by mouth daily.    Dispense:  90 tablet    Refill:  1   tirzepatide  (MOUNJARO ) 2.5 MG/0.5ML Pen    Sig: Inject 2.5 mg into the skin once a week.    Dispense:  9 mL    Refill:  1   FLUoxetine (PROZAC) 20 MG capsule    Sig: Take 1 capsule (20 mg total) by mouth daily.    Dispense:  90 capsule    Refill:  1    Follow-up:  Return in about 6 months (around 10/11/2024) for Chronic medical conditions.       Corrina Sabin, MD, FAAFP. [id number] and [facility] [place], [place] [phone]   04/12/2024, 5:14 PM

## 2024-04-12 NOTE — Patient Instructions (Signed)
 VISIT SUMMARY:  During your visit, we discussed your increased anxiety symptoms, which have been exacerbated by work-related stress and severe sleep deprivation. We also reviewed your heart failure, diabetes, kidney function, and cholesterol management. Additionally, you received your pneumonia vaccine and flu shot.  YOUR PLAN:  -ANXIETY DISORDER: Anxiety disorder involves excessive worry and nervousness. We prescribed Prozac to help manage your anxiety symptoms. Prozac is a non-addictive medication that does not cause drowsiness. We also discussed the importance of addressing work-related stress to help reduce your anxiety.  -HYPERTENSIVE HEART DISEASE WITH HEART FAILURE: Heart failure means your heart is not pumping blood as well as it should. Your blood pressure is well-controlled with your current medications, so you should continue taking Entresto , spironolactone , and carvedilol  as prescribed.  -TYPE 2 DIABETES MELLITUS WITH HYPERGLYCEMIA: Type 2 diabetes is a condition where your body does not use insulin  properly, leading to high blood sugar levels. Your A1c level of 6.2 shows good control of your diabetes. Continue taking Tirzepatide  2.5 mg weekly.  -MIXED HYPERLIPIDEMIA: Mixed hyperlipidemia means you have high levels of different types of fats in your blood. It is important to take your atorvastatin  10 mg daily as prescribed, without cutting the tablets in half, to get the full benefit.  -HYPOKALEMIA: Hypokalemia means you have low potassium levels in your blood. Continue taking your potassium chloride  supplements as prescribed. We have provided you with a 90-day supply for convenience.  -GENERAL HEALTH MAINTENANCE: You were due for your pneumonia vaccine and flu shot, and both were administered during your visit. Keeping up with these vaccines is important for your overall health.  INSTRUCTIONS:  Please follow up with cardiology for your heart failure management. Continue taking all  your medications as prescribed and address your work-related stress to help manage your anxiety. If you experience any side effects from Prozac or have any concerns, please contact our office.

## 2024-04-13 ENCOUNTER — Ambulatory Visit: Payer: Self-pay | Admitting: Family Medicine

## 2024-04-13 DIAGNOSIS — I130 Hypertensive heart and chronic kidney disease with heart failure and stage 1 through stage 4 chronic kidney disease, or unspecified chronic kidney disease: Secondary | ICD-10-CM

## 2024-04-13 LAB — CMP14+EGFR
ALT: 11 IU/L (ref 0–44)
AST: 13 IU/L (ref 0–40)
Albumin: 4.1 g/dL (ref 4.1–5.1)
Alkaline Phosphatase: 99 IU/L (ref 47–123)
BUN/Creatinine Ratio: 8 — ABNORMAL LOW (ref 9–20)
BUN: 14 mg/dL (ref 6–24)
Bilirubin Total: 0.3 mg/dL (ref 0.0–1.2)
CO2: 23 mmol/L (ref 20–29)
Calcium: 9.1 mg/dL (ref 8.7–10.2)
Chloride: 102 mmol/L (ref 96–106)
Creatinine, Ser: 1.78 mg/dL — ABNORMAL HIGH (ref 0.76–1.27)
Globulin, Total: 3.5 g/dL (ref 1.5–4.5)
Glucose: 91 mg/dL (ref 70–99)
Potassium: 4.1 mmol/L (ref 3.5–5.2)
Sodium: 139 mmol/L (ref 134–144)
Total Protein: 7.6 g/dL (ref 6.0–8.5)
eGFR: 49 mL/min/1.73 — ABNORMAL LOW (ref >59)

## 2024-04-13 LAB — MICROALBUMIN / CREATININE URINE RATIO
Creatinine, Urine: 173.7 mg/dL
Microalb/Creat Ratio: 7 mg/g{creat} (ref 0–29)
Microalbumin, Urine: 12.7 ug/mL

## 2024-04-18 ENCOUNTER — Other Ambulatory Visit: Payer: Self-pay | Admitting: Family Medicine

## 2024-04-18 DIAGNOSIS — E876 Hypokalemia: Secondary | ICD-10-CM

## 2024-04-18 DIAGNOSIS — E1159 Type 2 diabetes mellitus with other circulatory complications: Secondary | ICD-10-CM

## 2024-04-19 ENCOUNTER — Other Ambulatory Visit (HOSPITAL_COMMUNITY): Payer: Self-pay

## 2024-04-26 ENCOUNTER — Emergency Department (HOSPITAL_BASED_OUTPATIENT_CLINIC_OR_DEPARTMENT_OTHER)
Admission: EM | Admit: 2024-04-26 | Discharge: 2024-04-27 | Disposition: A | Discharge status: Home / Self Care | Attending: Emergency Medicine | Admitting: Emergency Medicine

## 2024-04-26 ENCOUNTER — Encounter (HOSPITAL_BASED_OUTPATIENT_CLINIC_OR_DEPARTMENT_OTHER): Payer: Self-pay | Admitting: Emergency Medicine

## 2024-04-26 ENCOUNTER — Other Ambulatory Visit: Payer: Self-pay

## 2024-04-26 DIAGNOSIS — R5383 Other fatigue: Secondary | ICD-10-CM | POA: Diagnosis not present

## 2024-04-26 DIAGNOSIS — Z794 Long term (current) use of insulin: Secondary | ICD-10-CM | POA: Diagnosis not present

## 2024-04-26 DIAGNOSIS — E1122 Type 2 diabetes mellitus with diabetic chronic kidney disease: Secondary | ICD-10-CM | POA: Diagnosis not present

## 2024-04-26 DIAGNOSIS — Z79899 Other long term (current) drug therapy: Secondary | ICD-10-CM | POA: Insufficient documentation

## 2024-04-26 DIAGNOSIS — E860 Dehydration: Secondary | ICD-10-CM | POA: Diagnosis present

## 2024-04-26 DIAGNOSIS — I509 Heart failure, unspecified: Secondary | ICD-10-CM | POA: Insufficient documentation

## 2024-04-26 DIAGNOSIS — I130 Hypertensive heart and chronic kidney disease with heart failure and stage 1 through stage 4 chronic kidney disease, or unspecified chronic kidney disease: Secondary | ICD-10-CM | POA: Insufficient documentation

## 2024-04-26 DIAGNOSIS — N182 Chronic kidney disease, stage 2 (mild): Secondary | ICD-10-CM | POA: Insufficient documentation

## 2024-04-26 LAB — COMPREHENSIVE METABOLIC PANEL WITH GFR
ALT: 18 U/L (ref 0–44)
AST: 22 U/L (ref 15–41)
Albumin: 3.8 g/dL (ref 3.5–5.0)
Alkaline Phosphatase: 87 U/L (ref 38–126)
Anion gap: 10 (ref 5–15)
BUN: 14 mg/dL (ref 6–20)
CO2: 25 mmol/L (ref 22–32)
Calcium: 8.7 mg/dL — ABNORMAL LOW (ref 8.9–10.3)
Chloride: 106 mmol/L (ref 98–111)
Creatinine, Ser: 1.5 mg/dL — ABNORMAL HIGH (ref 0.61–1.24)
GFR, Estimated: 60 mL/min — ABNORMAL LOW (ref >60)
Glucose, Bld: 106 mg/dL — ABNORMAL HIGH (ref 70–99)
Potassium: 4.1 mmol/L (ref 3.5–5.1)
Sodium: 141 mmol/L (ref 135–145)
Total Bilirubin: 0.3 mg/dL (ref 0.0–1.2)
Total Protein: 6.9 g/dL (ref 6.5–8.1)

## 2024-04-26 LAB — CBC WITH DIFFERENTIAL/PLATELET
Abs Immature Granulocytes: 0.06 K/uL (ref 0.00–0.07)
Basophils Absolute: 0 K/uL (ref 0.0–0.1)
Basophils Relative: 0 %
Eosinophils Absolute: 0.3 K/uL (ref 0.0–0.5)
Eosinophils Relative: 4 %
HCT: 36.8 % — ABNORMAL LOW (ref 39.0–52.0)
Hemoglobin: 11.8 g/dL — ABNORMAL LOW (ref 13.0–17.0)
Immature Granulocytes: 1 %
Lymphocytes Relative: 43 %
Lymphs Abs: 3.8 K/uL (ref 0.7–4.0)
MCH: 23.6 pg — ABNORMAL LOW (ref 26.0–34.0)
MCHC: 32.1 g/dL (ref 30.0–36.0)
MCV: 73.5 fL — ABNORMAL LOW (ref 80.0–100.0)
Monocytes Absolute: 0.6 K/uL (ref 0.1–1.0)
Monocytes Relative: 7 %
Neutro Abs: 4 K/uL (ref 1.7–7.7)
Neutrophils Relative %: 45 %
Platelets: 276 K/uL (ref 150–400)
RBC: 5.01 MIL/uL (ref 4.22–5.81)
RDW: 16 % — ABNORMAL HIGH (ref 11.5–15.5)
WBC: 8.8 K/uL (ref 4.0–10.5)
nRBC: 0 % (ref 0.0–0.2)

## 2024-04-26 LAB — RESP PANEL BY RT-PCR (RSV, FLU A&B, COVID)  RVPGX2
Influenza A by PCR: NEGATIVE
Influenza B by PCR: NEGATIVE
Resp Syncytial Virus by PCR: NEGATIVE
SARS Coronavirus 2 by RT PCR: NEGATIVE

## 2024-04-26 LAB — URINALYSIS, MICROSCOPIC (REFLEX): RBC / HPF: NONE SEEN RBC/hpf (ref 0–5)

## 2024-04-26 LAB — URINALYSIS, ROUTINE W REFLEX MICROSCOPIC
Bilirubin Urine: NEGATIVE
Glucose, UA: NEGATIVE mg/dL
Hgb urine dipstick: NEGATIVE
Ketones, ur: NEGATIVE mg/dL
Leukocytes,Ua: NEGATIVE
Nitrite: NEGATIVE
Protein, ur: 30 mg/dL — AB
Specific Gravity, Urine: >=1.03 (ref 1.005–1.030)
pH: 7 (ref 5.0–8.0)

## 2024-04-26 MED ORDER — SODIUM CHLORIDE 0.9 % IV BOLUS
1000.0000 mL | Freq: Once | INTRAVENOUS | Status: AC
  Administered 2024-04-26: 23:00:00 1000 mL via INTRAVENOUS

## 2024-04-26 NOTE — ED Triage Notes (Signed)
 Pt c/o feel dehydrated x 2-3 days; denies NV, reports mild diarrhea; denies pain; tolerating liquids and food

## 2024-04-26 NOTE — Discharge Instructions (Signed)
 As we discussed, your workup in the ER today was reassuring for acute findings.  Laboratory evaluation did not reveal any abnormalities.  Given that you felt dehydrated, we have given you some IV fluids. I recommend you continue to hydrate yourself at home and follow-up closely with your primary care provider.  Return if development of any new or worsening symptoms.

## 2024-04-26 NOTE — ED Provider Notes (Signed)
 [facility] Provider Note   CSN: [id number] Arrival date & time: 04/26/24  8062     Patient presents with: Fatigue   Derek Reed is a 38 y.o. male.   Patient with history of CHF, CKD, diabetes, hypertension presents today with complaints of fatigue. 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 Reports when he is like this he usually is dehydrated and needs IV fluids. Requests same. He has been eating and drinking normally but reports I don't feel like I can drink enough. Denies nausea, vomiting, or diarrhea.  No chest pain or shortness of breath.  No cough or congestion, fevers or chills.  No recent travel or surgeries, leg pain, or leg swelling.   The history is provided by the patient. No language interpreter was used.       Prior to Admission medications  Medication Sig Start Date End Date Taking? Authorizing Provider  amoxicillin  (AMOXIL ) 500 MG capsule Take 2 capsules (1,000 mg total) by mouth 2 (two) times daily. 10/06/23   Raford Lenis, MD  atorvastatin  (LIPITOR) 10 MG tablet Take 1 tablet (10 mg total) by mouth daily. 04/12/24   Newlin, Enobong, MD  carvedilol  (COREG ) 6.25 MG tablet TAKE 1 TABLET BY MOUTH TWICE  DAILY WITH MEALS 03/07/24   Newlin, Enobong, MD  FLUoxetine  (PROZAC ) 20 MG capsule Take 1 capsule (20 mg total) by mouth daily. 04/12/24   Newlin, Enobong, MD  hydrALAZINE  (APRESOLINE ) 50 MG tablet Take 1 tablet (50 mg total) by mouth 2 (two) times daily. 03/09/24   Delbert Clam, MD  Iron , Ferrous Sulfate , 325 (65 Fe) MG TABS Take 1 tablet by mouth every other day. 03/09/24   Newlin, Enobong, MD  potassium chloride  SA (KLOR-CON  M20) 20 MEQ tablet Take 1 tablet (20 mEq total) by mouth 2 (two) times daily. 04/12/24   Newlin, Enobong, MD  sacubitril -valsartan  (ENTRESTO ) 97-103 MG Take 1 tablet by mouth 2 (two) times daily. 04/12/24   Newlin, Enobong, MD   sildenafil  (VIAGRA ) 50 MG tablet Take 1 tablet (50 mg total) by mouth daily as needed for erectile dysfunction. 07/07/23   Croitoru, Mihai, MD  spironolactone  (ALDACTONE ) 25 MG tablet Take 1 tablet (25 mg total) by mouth daily. 04/12/24   Newlin, Enobong, MD  tirzepatide  (MOUNJARO ) 2.5 MG/0.5ML Pen Inject 2.5 mg into the skin once a week. 04/12/24   Newlin, Enobong, MD    Allergies: Egg protein-containing drug products    Review of Systems  Constitutional:  Positive for fatigue.  All other systems reviewed and are negative.   Updated Vital Signs BP (!) 134/95 (BP Location: Left Arm)   Pulse 74   Temp 98.1 F (36.7 C)   Resp 18   Ht 5' 6 (1.676 m)   Wt 113.1 kg   SpO2 100%   BMI 40.25 kg/m   Physical Exam Vitals and nursing note reviewed.  Constitutional:      General: He is not in acute distress.    Appearance: Normal appearance. He is normal weight. He is not ill-appearing, toxic-appearing or diaphoretic.  HENT:     Head: Normocephalic and atraumatic.  Cardiovascular:     Rate and Rhythm: Normal rate and regular rhythm.     Heart sounds: Normal heart sounds.  Pulmonary:     Effort: Pulmonary effort is normal. No respiratory distress.     Breath sounds: Normal breath sounds.  Abdominal:  General: Abdomen is flat.     Palpations: Abdomen is soft.     Tenderness: There is no abdominal tenderness.  Musculoskeletal:        General: Normal range of motion.     Cervical back: Normal range of motion.     Right lower leg: No edema.     Left lower leg: No edema.  Skin:    General: Skin is warm and dry.  Neurological:     General: No focal deficit present.     Mental Status: He is alert.  Psychiatric:        Mood and Affect: Mood normal.        Behavior: Behavior normal.     (all labs ordered are listed, but only abnormal results are displayed) Labs Reviewed  COMPREHENSIVE METABOLIC PANEL WITH GFR - Abnormal; Notable for the following components:      Result Value    Glucose, Bld 106 (*)    Creatinine, Ser 1.50 (*)    Calcium  8.7 (*)    GFR, Estimated 60 (*)    All other components within normal limits  CBC WITH DIFFERENTIAL/PLATELET - Abnormal; Notable for the following components:   Hemoglobin 11.8 (*)    HCT 36.8 (*)    MCV 73.5 (*)    MCH 23.6 (*)    RDW 16.0 (*)    All other components within normal limits  URINALYSIS, ROUTINE W REFLEX MICROSCOPIC - Abnormal; Notable for the following components:   Protein, ur 30 (*)    All other components within normal limits  URINALYSIS, MICROSCOPIC (REFLEX) - Abnormal; Notable for the following components:   Bacteria, UA RARE (*)    All other components within normal limits  RESP PANEL BY RT-PCR (RSV, FLU A&B, COVID)  RVPGX2    EKG: None  Radiology: No results found.   Procedures   Medications Ordered in the ED  sodium chloride  0.9 % bolus 1,000 mL (1,000 mLs Intravenous New Bag/Given 04/26/24 2324)                                    Medical Decision Making Amount and/or Complexity of Data Reviewed Labs: ordered.   This patient is a 38 y.o. male who presents to the ED for concern of fatigue, this involves an extensive number of treatment options, and is a complaint that carries with it a high risk of complications and morbidity. The emergent differential diagnosis prior to evaluation includes, but is not limited to, AKI, electrolyte derangement, anemia, dehydration, sepsis. This is not an exhaustive differential.   Past Medical History / Co-morbidities / Social History:  has a past medical history of Acute CHF (congestive heart failure) (HCC) (02/24/2019), Anxiety, CKD (chronic kidney disease) stage 2, GFR 60-89 ml/min, Diabetes mellitus without complication (HCC), Essential hypertension (02/24/2019), GAD (generalized anxiety disorder) (03/26/2014), Hyperlipidemia, Hypertension, Obesity, and Peritonsillar abscess.  Additional history: Chart reviewed. Pertinent results include: last echo  in 2022 showed EF 55-60% with no wall motion abnormalities  Physical Exam: Physical exam performed. The pertinent findings include: No acute physical exam abnormalities  Lab Tests: I ordered, and personally interpreted labs.  The pertinent results include:  Creatinine 1.50 (improved from last check 1.78 2 weeks ago).  No acute laboratory abnormalities   Medications: I ordered medication including IV fluids  for dehydration. Reevaluation of the patient after these medicines showed that the patient improved. I have reviewed the  patients home medicines and have made adjustments as needed.   Disposition: After consideration of the diagnostic results and the patients response to treatment, I feel that emergency department workup does not suggest an emergent condition requiring admission or immediate intervention beyond what has been performed at this time. The plan is: Discharge with close outpatient follow-up and return precautions.  After fluids, patient feels better and wants to go home.  His workup is benign and he is asymptomatic. Evaluation and diagnostic testing in the emergency department does not suggest an emergent condition requiring admission or immediate intervention beyond what has been performed at this time.  Plan for discharge with close PCP follow-up.  Patient is understanding and amenable with plan, educated on red flag symptoms that would prompt immediate return.  Patient discharged in stable condition.  Final diagnoses:  Other fatigue    ED Discharge Orders     None     An After Visit Summary was printed and given to the patient.      Lakyn Alsteen A, PA-C 04/26/24 2359    Tegeler, Lonni PARAS, MD 04/27/24 [phone]

## 2024-05-19 ENCOUNTER — Emergency Department (HOSPITAL_BASED_OUTPATIENT_CLINIC_OR_DEPARTMENT_OTHER): Payer: Self-pay

## 2024-05-19 ENCOUNTER — Encounter (HOSPITAL_BASED_OUTPATIENT_CLINIC_OR_DEPARTMENT_OTHER): Payer: Self-pay

## 2024-05-19 ENCOUNTER — Other Ambulatory Visit: Payer: Self-pay

## 2024-05-19 ENCOUNTER — Emergency Department (HOSPITAL_BASED_OUTPATIENT_CLINIC_OR_DEPARTMENT_OTHER)
Admission: EM | Admit: 2024-05-19 | Discharge: 2024-05-19 | Disposition: A | Discharge status: Home / Self Care | Payer: Self-pay | Attending: Emergency Medicine | Admitting: Emergency Medicine

## 2024-05-19 DIAGNOSIS — W108XXA Fall (on) (from) other stairs and steps, initial encounter: Secondary | ICD-10-CM | POA: Insufficient documentation

## 2024-05-19 DIAGNOSIS — N189 Chronic kidney disease, unspecified: Secondary | ICD-10-CM | POA: Insufficient documentation

## 2024-05-19 DIAGNOSIS — I129 Hypertensive chronic kidney disease with stage 1 through stage 4 chronic kidney disease, or unspecified chronic kidney disease: Secondary | ICD-10-CM | POA: Insufficient documentation

## 2024-05-19 DIAGNOSIS — W19XXXA Unspecified fall, initial encounter: Secondary | ICD-10-CM

## 2024-05-19 DIAGNOSIS — S63601A Unspecified sprain of right thumb, initial encounter: Secondary | ICD-10-CM | POA: Insufficient documentation

## 2024-05-19 DIAGNOSIS — Z79899 Other long term (current) drug therapy: Secondary | ICD-10-CM | POA: Insufficient documentation

## 2024-05-19 DIAGNOSIS — S4991XA Unspecified injury of right shoulder and upper arm, initial encounter: Secondary | ICD-10-CM | POA: Insufficient documentation

## 2024-05-19 NOTE — ED Triage Notes (Signed)
 Pt states that he fell on his R shoulder and he thinks he sprained his R thumb.

## 2024-05-19 NOTE — Discharge Instructions (Addendum)
 You were seen in the emergency department today for concerns of a fall.  Your imaging was thankfully negative with no abnormality seen on x-rays of your shoulder and thumb.  I do suspect you likely have an AC joint injury given the pain that you are feeling in this particular area which would coincide with your fall today.  Take Tylenol  and ibuprofen .  For any concerns of new or worsening symptoms, return to the emergency department.  Otherwise, please follow-up closely with your primary care provider.

## 2024-05-19 NOTE — ED Provider Notes (Signed)
 " [place] [facility] Provider Note   CSN: [id number] Arrival date & time: 05/19/24  1950     Patient presents with: Derek Reed is a 39 y.o. male.  Patient with past history significant for obesity, hypertension, CKD presents to the emergency department concerns of a fall.  Reports that he fell while walking down some stairs today in his home and landed on his right shoulder and also injuring his right thumb.  He reports difficulty with range of motion of the right shoulder and thumb.  Denies any tingling or numbness.  He is on blood thinners and denies any head impact or head injury.  Denies loss of consciousness.   Fall       Prior to Admission medications  Medication Sig Start Date End Date Taking? Authorizing Provider  amoxicillin  (AMOXIL ) 500 MG capsule Take 2 capsules (1,000 mg total) by mouth 2 (two) times daily. 10/06/23   Raford Lenis, MD  atorvastatin  (LIPITOR) 10 MG tablet Take 1 tablet (10 mg total) by mouth daily. 04/12/24   Newlin, Enobong, MD  carvedilol  (COREG ) 6.25 MG tablet TAKE 1 TABLET BY MOUTH TWICE  DAILY WITH MEALS 03/07/24   Newlin, Enobong, MD  FLUoxetine  (PROZAC ) 20 MG capsule Take 1 capsule (20 mg total) by mouth daily. 04/12/24   Newlin, Enobong, MD  hydrALAZINE  (APRESOLINE ) 50 MG tablet Take 1 tablet (50 mg total) by mouth 2 (two) times daily. 03/09/24   Newlin, Enobong, MD  Iron , Ferrous Sulfate , 325 (65 Fe) MG TABS Take 1 tablet by mouth every other day. 03/09/24   Newlin, Enobong, MD  potassium chloride  SA (KLOR-CON  M20) 20 MEQ tablet Take 1 tablet (20 mEq total) by mouth 2 (two) times daily. 04/12/24   Newlin, Enobong, MD  sacubitril -valsartan  (ENTRESTO ) 97-103 MG Take 1 tablet by mouth 2 (two) times daily. 04/12/24   Newlin, Enobong, MD  sildenafil  (VIAGRA ) 50 MG tablet Take 1 tablet (50 mg total) by mouth daily as needed for erectile dysfunction. 07/07/23   Croitoru, Mihai, MD  spironolactone  (ALDACTONE ) 25 MG  tablet Take 1 tablet (25 mg total) by mouth daily. 04/12/24   Newlin, Enobong, MD  tirzepatide  (MOUNJARO ) 2.5 MG/0.5ML Pen Inject 2.5 mg into the skin once a week. 04/12/24   Newlin, Enobong, MD    Allergies: Egg protein-containing drug products    Review of Systems  Musculoskeletal:        Shoulder pain  All other systems reviewed and are negative.   Updated Vital Signs BP 120/88 (BP Location: Left Arm)   Pulse 68   Temp 98.2 F (36.8 C)   Resp 18   Ht 5' 6 (1.676 m)   Wt 108.9 kg   SpO2 98%   BMI 38.74 kg/m   Physical Exam Vitals and nursing note reviewed.  Constitutional:      General: He is not in acute distress.    Appearance: He is well-developed.  HENT:     Head: Normocephalic and atraumatic.  Eyes:     Conjunctiva/sclera: Conjunctivae normal.  Cardiovascular:     Rate and Rhythm: Normal rate and regular rhythm.     Heart sounds: No murmur heard. Pulmonary:     Effort: Pulmonary effort is normal. No respiratory distress.     Breath sounds: Normal breath sounds.  Abdominal:     Palpations: Abdomen is soft.     Tenderness: There is no abdominal tenderness.  Musculoskeletal:  General: Tenderness and signs of injury present. No swelling or deformity.     Cervical back: Neck supple.     Comments: TTP along the [facility] joint. Cross body movement significantly worsens pain. Shoulder otherwise unremarkable.  Skin:    General: Skin is warm and dry.     Capillary Refill: Capillary refill takes less than 2 seconds.  Neurological:     Mental Status: He is alert.  Psychiatric:        Mood and Affect: Mood normal.     (all labs ordered are listed, but only abnormal results are displayed) Labs Reviewed - No data to display  EKG: None  Radiology: DG Shoulder Right Result Date: 05/19/2024 CLINICAL DATA:  Right shoulder pain after fall EXAM: RIGHT SHOULDER - 2+ VIEW COMPARISON:  None Available. FINDINGS: Frontal, transscapular, and axillary views are obtained. No  fracture, subluxation, or dislocation. Joint spaces are well preserved. Right chest is clear. IMPRESSION: 1. Unremarkable right shoulder. Electronically Signed   By: Ozell Daring M.D.   On: 05/19/2024 20:29   DG Finger Thumb Right Result Date: 05/19/2024 CLINICAL DATA:  Clemens, injury EXAM: RIGHT THUMB 2+V COMPARISON:  None Available. FINDINGS: Frontal, oblique, and lateral views of the right thumb are obtained. No acute fracture, subluxation, or dislocation. 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 Diffuse soft tissue swelling. IMPRESSION: 1. Diffuse soft tissue swelling.  No acute fracture. 2. Mild osteoarthritis. Electronically Signed   By: Ozell Daring M.D.   On: 05/19/2024 20:28     Procedures   Medications Ordered in the ED - No data to display                                  Medical Decision Making Amount and/or Complexity of Data Reviewed Radiology: ordered.   This patient presents to the ED for concern of fall.  Differential diagnosis includes AC joint injury, shoulder sprain, shoulder dislocation, thumb sprain    Additional history obtained:  Additional history obtained from chart review   Imaging Studies ordered:  I ordered imaging studies including x-ray of the right shoulder, right thumb I independently visualized and interpreted imaging which showed negative for any acute abnormality, some soft tissue swelling of the right thumb I agree with the radiologist interpretation   Problem List / ED Course:  Patient past history significant for CKD, obesity, hypertension presents to the emergency department with concerns of a fall.  Poorly had a fall landing on his right shoulder and injuring his right thumb.  States he primarily has pain to the right shoulder with difficulty with movement primarily around his body.  He denies any feelings of tingling or weakness or numbness. Physical exam is concerning for primarily focal  tenderness towards the [facility] joint as well as significant pain with cross body movement.  There is no obvious visible deformity or other significant discomfort with range of motion testing of the shoulder. X-ray of the right shoulder and right thumb are unremarkable.  No obvious or acute abnormality or injury seen.  Based on my exam, I am concerned for possible [facility] joint injury.  With no visible change in positioning of the clavicle, this is most likely an AC ligament sprain.  Will place patient into a shoulder immobilizer.  Advised continuation of Tylenol  ibuprofen .  Encourage close follow-up with PCP and orthopedics if pain is not well-controlled and improved in the next week or so.  Otherwise stable for outpatient follow-up and discharged home.   Social Determinants of Health:  None  Final diagnoses:  Injury of right acromioclavicular joint, initial encounter  Sprain of right thumb, unspecified site of digit, initial encounter  Fall, initial encounter    ED Discharge Orders     None          Cecily Legrand DELENA DEVONNA 05/19/24 2117    Franklyn Sid SAILOR, MD 05/19/24 2123  "

## 2024-05-25 ENCOUNTER — Other Ambulatory Visit (HOSPITAL_COMMUNITY): Payer: Self-pay

## 2024-05-25 ENCOUNTER — Other Ambulatory Visit: Payer: Self-pay

## 2024-05-27 ENCOUNTER — Other Ambulatory Visit (HOSPITAL_COMMUNITY): Payer: Self-pay

## 2024-05-30 ENCOUNTER — Ambulatory Visit: Admitting: Family Medicine

## 2024-06-02 ENCOUNTER — Other Ambulatory Visit: Payer: Self-pay

## 2024-06-02 ENCOUNTER — Other Ambulatory Visit (HOSPITAL_COMMUNITY): Payer: Self-pay

## 2024-06-02 MED ORDER — REPLESTA 1.25 MG (50000 UT) PO WAFR
12.0000 | WAFER | ORAL | 0 refills | Status: AC
  Filled 2024-06-02: qty 4, 28d supply, fill #0

## 2024-06-06 ENCOUNTER — Other Ambulatory Visit (HOSPITAL_COMMUNITY): Payer: Self-pay

## 2024-06-06 ENCOUNTER — Telehealth: Payer: Self-pay | Admitting: Pharmacy Technician

## 2024-06-06 NOTE — Telephone Encounter (Signed)
" ° °  Ran test claim for mounjaro . For a 28 day supply and the co-pay is 0.00 . PA is not needed at this time. This test claim was processed through [facility]- copay amounts may vary at other pharmacies due to pharmacy/plan contracts, or as the patient moves through the different stages of their insurance plan.     Has new ins and new ins doesn't require pa  "

## 2024-06-11 ENCOUNTER — Encounter: Payer: Self-pay | Admitting: Family Medicine

## 2024-06-13 ENCOUNTER — Other Ambulatory Visit (HOSPITAL_COMMUNITY): Payer: Self-pay

## 2024-06-13 ENCOUNTER — Encounter (HOSPITAL_COMMUNITY): Payer: Self-pay | Admitting: Pharmacist

## 2024-06-13 ENCOUNTER — Other Ambulatory Visit: Payer: Self-pay | Admitting: Family Medicine

## 2024-06-13 DIAGNOSIS — E1159 Type 2 diabetes mellitus with other circulatory complications: Secondary | ICD-10-CM

## 2024-06-13 DIAGNOSIS — E876 Hypokalemia: Secondary | ICD-10-CM

## 2024-06-15 ENCOUNTER — Other Ambulatory Visit (HOSPITAL_COMMUNITY): Payer: Self-pay

## 2024-06-15 ENCOUNTER — Encounter: Payer: Self-pay | Admitting: Cardiovascular Disease

## 2024-06-15 ENCOUNTER — Other Ambulatory Visit: Payer: Self-pay | Admitting: Family Medicine

## 2024-06-15 DIAGNOSIS — E1159 Type 2 diabetes mellitus with other circulatory complications: Secondary | ICD-10-CM

## 2024-06-15 DIAGNOSIS — I130 Hypertensive heart and chronic kidney disease with heart failure and stage 1 through stage 4 chronic kidney disease, or unspecified chronic kidney disease: Secondary | ICD-10-CM

## 2024-06-16 ENCOUNTER — Emergency Department (HOSPITAL_BASED_OUTPATIENT_CLINIC_OR_DEPARTMENT_OTHER)
Admission: EM | Admit: 2024-06-16 | Discharge: 2024-06-16 | Disposition: A | Discharge status: Home / Self Care | Payer: Self-pay | Source: Home / Self Care

## 2024-06-16 ENCOUNTER — Encounter (HOSPITAL_BASED_OUTPATIENT_CLINIC_OR_DEPARTMENT_OTHER): Payer: Self-pay

## 2024-06-16 ENCOUNTER — Other Ambulatory Visit: Payer: Self-pay

## 2024-06-16 ENCOUNTER — Emergency Department (HOSPITAL_BASED_OUTPATIENT_CLINIC_OR_DEPARTMENT_OTHER): Payer: Self-pay

## 2024-06-16 ENCOUNTER — Other Ambulatory Visit (HOSPITAL_COMMUNITY): Payer: Self-pay

## 2024-06-16 DIAGNOSIS — B338 Other specified viral diseases: Secondary | ICD-10-CM

## 2024-06-16 LAB — RESP PANEL BY RT-PCR (RSV, FLU A&B, COVID)  RVPGX2
Influenza A by PCR: NEGATIVE
Influenza B by PCR: NEGATIVE
Resp Syncytial Virus by PCR: POSITIVE — AB
SARS Coronavirus 2 by RT PCR: NEGATIVE

## 2024-06-16 MED ORDER — ALBUTEROL SULFATE HFA 108 (90 BASE) MCG/ACT IN AERS
1.0000 | INHALATION_SPRAY | Freq: Four times a day (QID) | RESPIRATORY_TRACT | 0 refills | Status: AC | PRN
  Filled 2024-06-16: qty 6.7, 25d supply, fill #0

## 2024-06-16 NOTE — Discharge Instructions (Addendum)
 You have RSV.  This typically has a 7-day course and I will resolve on its own.  Does not require any medications.  It is a viral upper respiratory infection.  Chest x-ray was clear today.  I did not hear any wheezing on exam.  However since you have noticed wheezing absent in albuterol  inhaler to the pharmacy for you to use as needed.  Return for any concerning symptoms.

## 2024-06-16 NOTE — ED Provider Notes (Cosign Needed)
 " [facility] Reed Provider Note   CSN: [id number] Arrival date & time: 06/16/24  1010     Patient presents with: Shortness of Breath   Derek Reed is a 39 y.o. male.   39 year old male presents today for concern of shortness of breath and wheezing for the past 3 days along with cough, congestion.  No recent sick contacts.  Does have history of CHF.  No difficulty lying flat.  No fever.  States he noticed wheezing at home.  He does have 4 kids.  None of them have similar symptoms.  The history is provided by the patient. No language interpreter was used.       Prior to Admission medications  Medication Sig Start Date End Date Taking? Authorizing Provider  amoxicillin  (AMOXIL ) 500 MG capsule Take 2 capsules (1,000 mg total) by mouth 2 (two) times daily. 10/06/23   Raford Lenis, MD  atorvastatin  (LIPITOR) 10 MG tablet Take 1 tablet (10 mg total) by mouth daily. 04/12/24   Newlin, Enobong, MD  carvedilol  (COREG ) 6.25 MG tablet TAKE 1 TABLET BY MOUTH TWICE  DAILY WITH MEALS 03/07/24   Newlin, Enobong, MD  Cholecalciferol  (REPLESTA) 1.25 MG (50000 UT) WAFR 1 (one) wafer by mouth weekly 06/02/24     FLUoxetine  (PROZAC ) 20 MG capsule Take 1 capsule (20 mg total) by mouth daily. 04/12/24   Newlin, Enobong, MD  hydrALAZINE  (APRESOLINE ) 50 MG tablet Take 1 tablet (50 mg total) by mouth 2 (two) times daily. 03/09/24   Newlin, Enobong, MD  Iron , Ferrous Sulfate , 325 (65 Fe) MG TABS Take 1 tablet by mouth every other day. 03/09/24   Newlin, Enobong, MD  potassium chloride  SA (KLOR-CON  M) 20 MEQ tablet TAKE 1 TABLET BY MOUTH 2 TIMES A DAY 06/13/24   Newlin, Enobong, MD  sacubitril -valsartan  (ENTRESTO ) 97-103 MG Take 1 tablet by mouth 2 (two) times daily. 04/12/24   Newlin, Enobong, MD  sildenafil  (VIAGRA ) 50 MG tablet Take 1 tablet (50 mg total) by mouth daily as needed for erectile dysfunction. 07/07/23   Croitoru, Mihai, MD  spironolactone  (ALDACTONE ) 25 MG tablet  Take 1 tablet (25 mg total) by mouth daily. 04/12/24   Newlin, Enobong, MD  tirzepatide  (MOUNJARO ) 2.5 MG/0.5ML Pen Inject 2.5 mg into the skin once a week. 04/12/24   Newlin, Enobong, MD    Allergies: Egg protein-containing drug products    Review of Systems  Constitutional:  Negative for chills and fever.  HENT:  Negative for congestion, sore throat and trouble swallowing.   Respiratory:  Positive for cough, shortness of breath and wheezing.   Cardiovascular:  Negative for chest pain.  All other systems reviewed and are negative.   Updated Vital Signs BP (!) 133/97 (BP Location: Right Arm)   Pulse 70   Temp 98.4 F (36.9 C) (Oral)   Resp 19   SpO2 99%   Physical Exam Vitals and nursing note reviewed.  Constitutional:      General: He is not in acute distress.    Appearance: Normal appearance. He is obese. He is not ill-appearing.  HENT:     Head: Normocephalic and atraumatic.     Nose: Nose normal.  Eyes:     Conjunctiva/sclera: Conjunctivae normal.  Cardiovascular:     Rate and Rhythm: Normal rate and regular rhythm.  Pulmonary:     Effort: Pulmonary effort is normal. No respiratory distress.     Breath sounds: Normal breath sounds. No stridor. No wheezing or rales.  Musculoskeletal:        General: No deformity. Normal range of motion.     Cervical back: Normal range of motion.     Right lower leg: No edema.     Left lower leg: No edema.  Skin:    Findings: No rash.  Neurological:     Mental Status: He is alert.     (all labs ordered are listed, but only abnormal results are displayed) Labs Reviewed  RESP PANEL BY RT-PCR (RSV, FLU A&B, COVID)  RVPGX2 - Abnormal; Notable for the following components:      Result Value   Resp Syncytial Virus by PCR POSITIVE (*)    All other components within normal limits    EKG: None  Radiology: DG Chest 2 View Result Date: 06/16/2024 CLINICAL DATA:  Dyspnea. EXAM: CHEST - 2 VIEW COMPARISON:  12/17/2023 FINDINGS: The  heart size and mediastinal contours are within normal limits. Both lungs are clear. The visualized skeletal structures are unremarkable. IMPRESSION: No active cardiopulmonary disease. Electronically Signed   By: Rockey Kilts M.D.   On: 06/16/2024 11:10     Procedures   Medications Ordered in the ED - No data to display                                  Medical Decision Making Amount and/or Complexity of Data Reviewed Radiology: ordered.   39 year old male presents today for concern of URI symptoms.  He is positive for RSV.  No signs of volume overload on chest x-ray.  No evidence of pneumonia.  Hemodynamically he is stable.  Supportive care discussed.  Strict return precautions discussed.  Patient stable for discharge.  No indications for admission at this time.  Albuterol  given to keep on hand given his reports of wheezing at home.  Final diagnoses:  Infection due to respiratory syncytial virus (RSV), unspecified infection type    ED Discharge Orders          Ordered    albuterol  (VENTOLIN  HFA) 108 (90 Base) MCG/ACT inhaler  Every 6 hours PRN        06/16/24 1206               Hildegard Loge, PA-C 06/16/24 1236  "

## 2024-06-16 NOTE — ED Triage Notes (Signed)
 Reports wheezing/SHOB for 3 days. Also reports cough, congestion for 3 days.  No obvious signs of respiratory distress in triage

## 2024-10-11 ENCOUNTER — Ambulatory Visit: Admitting: Family Medicine
